# Patient Record
Sex: Female | Born: 1935 | Race: White | Hispanic: No | Smoking: Former smoker
Health system: Southern US, Community
[De-identification: ages and names within clinical notes are randomized; demographics above are authoritative.]

## PROBLEM LIST (undated history)

## (undated) DIAGNOSIS — Z860100 Personal history of colon polyps, unspecified: Secondary | ICD-10-CM

## (undated) DIAGNOSIS — I4891 Unspecified atrial fibrillation: Secondary | ICD-10-CM

## (undated) DIAGNOSIS — I2510 Atherosclerotic heart disease of native coronary artery without angina pectoris: Secondary | ICD-10-CM

## (undated) DIAGNOSIS — I10 Essential (primary) hypertension: Secondary | ICD-10-CM

## (undated) DIAGNOSIS — E785 Hyperlipidemia, unspecified: Secondary | ICD-10-CM

## (undated) DIAGNOSIS — A0472 Enterocolitis due to Clostridium difficile, not specified as recurrent: Secondary | ICD-10-CM

## (undated) HISTORY — DX: Atherosclerotic heart disease of native coronary artery without angina pectoris: I25.10

## (undated) HISTORY — DX: Personal history of colonic polyps: Z86.010

## (undated) HISTORY — DX: Enterocolitis due to Clostridium difficile, not specified as recurrent: A04.72

## (undated) HISTORY — PX: TONSILLECTOMY AND ADENOIDECTOMY: SHX28

## (undated) HISTORY — DX: Hyperlipidemia, unspecified: E78.5

## (undated) HISTORY — DX: Personal history of colon polyps, unspecified: Z86.0100

## (undated) HISTORY — DX: Essential (primary) hypertension: I10

## (undated) HISTORY — DX: Unspecified atrial fibrillation: I48.91

---

## 2007-11-22 ENCOUNTER — Encounter: Payer: Self-pay | Admitting: Internal Medicine

## 2008-08-16 DIAGNOSIS — I4891 Unspecified atrial fibrillation: Secondary | ICD-10-CM

## 2008-08-16 DIAGNOSIS — I2510 Atherosclerotic heart disease of native coronary artery without angina pectoris: Secondary | ICD-10-CM

## 2008-08-16 DIAGNOSIS — I25119 Atherosclerotic heart disease of native coronary artery with unspecified angina pectoris: Secondary | ICD-10-CM

## 2008-08-16 HISTORY — DX: Atherosclerotic heart disease of native coronary artery without angina pectoris: I25.10

## 2008-08-16 HISTORY — DX: Unspecified atrial fibrillation: I48.91

## 2008-11-06 ENCOUNTER — Encounter: Payer: Self-pay | Admitting: Internal Medicine

## 2009-02-13 ENCOUNTER — Encounter: Payer: Self-pay | Admitting: Internal Medicine

## 2009-09-16 ENCOUNTER — Ambulatory Visit: Payer: Self-pay | Admitting: Internal Medicine

## 2009-09-16 DIAGNOSIS — K5730 Diverticulosis of large intestine without perforation or abscess without bleeding: Secondary | ICD-10-CM | POA: Insufficient documentation

## 2009-09-16 DIAGNOSIS — I10 Essential (primary) hypertension: Secondary | ICD-10-CM

## 2009-09-16 DIAGNOSIS — E785 Hyperlipidemia, unspecified: Secondary | ICD-10-CM | POA: Insufficient documentation

## 2009-09-16 DIAGNOSIS — I480 Paroxysmal atrial fibrillation: Secondary | ICD-10-CM | POA: Insufficient documentation

## 2009-09-16 DIAGNOSIS — I4891 Unspecified atrial fibrillation: Secondary | ICD-10-CM

## 2009-09-18 LAB — CONVERTED CEMR LAB
ALT: 23 units/L (ref 0–35)
AST: 23 units/L (ref 0–37)
BUN: 17 mg/dL (ref 6–23)
CO2: 31 meq/L (ref 19–32)
Calcium: 9.6 mg/dL (ref 8.4–10.5)
Chloride: 106 meq/L (ref 96–112)
Creatinine, Ser: 1 mg/dL (ref 0.4–1.2)
Eosinophils Relative: 0.9 % (ref 0.0–5.0)
GFR calc non Af Amer: 56.72 mL/min (ref >60)
HCT: 40.5 % (ref 36.0–46.0)
Hemoglobin: 13.6 g/dL (ref 12.0–15.0)
Lymphs Abs: 1.9 10*3/uL (ref 0.7–4.0)
MCV: 101.9 fL — ABNORMAL HIGH (ref 78.0–100.0)
Monocytes Absolute: 0.2 10*3/uL (ref 0.1–1.0)
Monocytes Relative: 4.1 % (ref 3.0–12.0)
Neutro Abs: 3.8 10*3/uL (ref 1.4–7.7)
Platelets: 214 10*3/uL (ref 150.0–400.0)
TSH: 1.71 microintl units/mL (ref 0.35–5.50)
Total Bilirubin: 1.7 mg/dL — ABNORMAL HIGH (ref 0.3–1.2)
Total Protein: 7.1 g/dL (ref 6.0–8.3)
WBC: 6 10*3/uL (ref 4.5–10.5)

## 2010-02-05 ENCOUNTER — Telehealth: Payer: Self-pay | Admitting: Internal Medicine

## 2010-03-26 ENCOUNTER — Ambulatory Visit: Payer: Self-pay | Admitting: Internal Medicine

## 2010-03-26 DIAGNOSIS — J069 Acute upper respiratory infection, unspecified: Secondary | ICD-10-CM

## 2010-03-30 LAB — CONVERTED CEMR LAB
Cholesterol: 226 mg/dL — ABNORMAL HIGH (ref 0–200)
Triglycerides: 124 mg/dL (ref 0.0–149.0)

## 2010-09-15 NOTE — Progress Notes (Signed)
Summary: Diarrhea  Phone Note Call from Patient Call back at Home Phone [phone]   Caller: Creola Corn Call For: Cindee Salt MD Summary of Call: Patient's husband has been having diarrhea x 2 weeks with Imodium and Lomotil helping temporarily.  Ms. Pfund has had the problem for about 1 week.  No fever, no pain, no vomiting, no loss of appetite.  Please advise.  [facility] [address] Initial call taken by: Delilah Shan CMA Duncan Dull),  February 05, 2010 9:23 AM  Follow-up for Phone Call        please stop the immdium and lomotil If they have an intestinal infection, that will often prolong it  Okay to try some kaopectacte as needed but best to let this run through and just keep up with fluids  see if fever or sig pain Follow-up by: Cindee Salt MD,  February 05, 2010 10:05 AM  Additional Follow-up for Phone Call Additional follow up Details #1::        Spoke with patient's husband and advised results.  Additional Follow-up by: Mervin Hack CMA Duncan Dull),  February 05, 2010 4:54 PM

## 2010-09-15 NOTE — Assessment & Plan Note (Signed)
Summary: NEW PT TO [facility]   Vital Signs:  Patient profile:   77 year old female Height:      67 inches Weight:      160 pounds BMI:     25.15 Temp:     98.2 degrees F oral Pulse rate:   76 / minute Pulse rhythm:   regular BP sitting:   150 / 80  (left arm) Cuff size:   large  Vitals Entered By: Mervin Hack CMA Duncan Dull) (September 16, 2009 11:52 AM) CC: new patient to establish care   History of Present Illness: Establishing here Moved here from [place] in August At [facility] Satisfied with this  HTN goes back for some time did have atrial fibrillation briefly---was caused by severe C. dif infection in 3/10  Hypercholesterolemia with Rx for about 5 years No problems with meds but wants to get off Feels some increased leg cramps are due to the crestor No history of CAD Cath done 3/10 didn't show blockages  Preventive Screening-Counseling & Management  Alcohol-Tobacco     Smoking Status: quit  Allergies (verified): 1)  ! Penicillin V Potassium (Penicillin V Potassium) 2)  ! Codeine Sulfate (Codeine Sulfate)  Past History:  Past Medical History: Atrial fibrillation Diverticulosis, colon Hyperlipidemia Hypertension  Past Surgical History: 1937  Tonsils/adenoids 3/10  Severe C. dif ---had brief atrial fib then. Cath benign  Family History: Mom died of ovarian cancer. Had COPD. 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 Enjoys white wine  Has living will. Husband then son [name] (MD) to make health care decisions. Has DNR order in past and requests againSmoking Status:  quit  Review of Systems General:  Denies sleep disorder; swims nearly daily weight up 10# since the move wears seat  belt. Eyes:  Denies double vision and vision loss-1 eye. ENT:  Complains of decreased hearing; uses hearing aides concerned about cerumen Own teeth--no problems. CV:  Denies chest pain or discomfort, difficulty breathing at night, difficulty breathing while lying down, fainting, lightheadness, palpitations, and shortness of breath with exertion. Resp:  Complains of cough; denies shortness of breath; occ coughs with chlorine exposure. GI:  Denies bloody stools, change in bowel habits, dark tarry stools, and indigestion. GU:  Denies dysuria and incontinence. MS:  Complains of joint pain; denies joint swelling; mild knee pain--occ tylenol. Derm:  Denies lesion(s) and rash. Neuro:  Denies headaches, numbness, tingling, and weakness. Psych:  Denies anxiety and depression. Allergy:  Denies seasonal allergies and sneezing.  Physical Exam  General:  alert and normal appearance.   Ears:  mild cerumenosis bilat Mouth:  no erythema and no exudates.   Neck:  supple, no masses, no thyromegaly, no carotid bruits, and no cervical lymphadenopathy.   Lungs:  normal respiratory effort and normal breath sounds.   Heart:  normal rate, regular rhythm, no murmur, and no gallop.   Abdomen:  soft, non-tender, and no masses.   Msk:  no joint tenderness and no joint swelling.   Pulses:  1+ in feet Extremities:  no edema Neurologic:  alert & oriented X3, strength normal in all extremities, and gait normal.   Skin:  no rashes and no suspicious lesions.   Axillary Nodes:  No palpable lymphadenopathy Psych:  normally interactive, good eye contact, not anxious appearing, and not depressed appearing.     Impression & Recommendations:  Problem # 1:  HYPERTENSION (ICD-401.9) Assessment Unchanged good control will check labs  Her updated medication list for this problem includes:    Metoprolol Succinate 50 Mg Xr24h-tab (Metoprolol succinate) .Marland Kitchen... Take 2 by mouth two times a day    Ramipril 10 Mg Caps  (Ramipril) .Marland Kitchen... Take 1 by mouth once daily  BP today: 150/80  Problem # 2:  ATRIAL FIBRILLATION (ICD-427.31) Assessment: Comment Only  only once during acute illness on ASA and beta blocker regular now P: no changes  Her updated medication list for this problem includes:    Aspirin 81 Mg Tabs (Aspirin) .Marland Kitchen... Take 1 by mouth once daily    Metoprolol Succinate 50 Mg Xr24h-tab (Metoprolol succinate) .Marland Kitchen... Take 2 by mouth two times a day  Orders: EKG w/ Interpretation (93000) TLB-Renal Function Panel (80069-RENAL) TLB-CBC Platelet - w/Differential (85025-CBCD) TLB-Hepatic/Liver Function Pnl (80076-HEPATIC) TLB-TSH (Thyroid Stimulating Hormone) (84443-TSH) Venipuncture ([address])  Problem # 3:  HYPERLIPIDEMIA (ICD-272.4) Assessment: Comment Only she has cramps with this med Discussed prmary prevention, esp at her age, in view of recent benign cath will stop the med  Complete Medication List: 1)  Aspirin 81 Mg Tabs (Aspirin) .... Take 1 by mouth once daily 2)  Metoprolol Succinate 50 Mg Xr24h-tab (Metoprolol succinate) .... Take 2 by mouth two times a day 3)  Ramipril 10 Mg Caps (Ramipril) .... Take 1 by mouth once daily  Patient Instructions: 1)  Please schedule a follow-up appointment in 6 months .   Current Allergies (reviewed today): ! PENICILLIN V POTASSIUM (PENICILLIN V POTASSIUM) ! CODEINE SULFATE (CODEINE SULFATE)   Immunization History:  Tetanus/Td Immunization History:    Tetanus/Td:  Historical (08/16/2004)  Pneumovax Immunization History:    Pneumovax:  Historical (08/17/1999)  Zostavax History:    Zostavax # 1:  Zostavax (08/16/2006)    EKG  Procedure date:  09/16/2009  Findings:      sinus @ 75 Normal   Appended Document: NEW PT TO [facility]    Clinical Lists Changes  Problems: Added new problem of CORONARY ARTERY DISEASE (ICD-414.00) Assessed HYPERLIPIDEMIA as comment only -  in view of review of records that shows she does  have CAD, will continue crestor but at lower dose  Her updated medication list for this problem includes:    Crestor 10 Mg Tabs (Rosuvastatin calcium) .Marland Kitchen... 1 daily for high cholesterol  Medications: Added new medication of CRESTOR 10 MG TABS (ROSUVASTATIN CALCIUM) 1 daily for high cholesterol - Signed Rx of CRESTOR 10 MG TABS (ROSUVASTATIN CALCIUM) 1 daily for high cholesterol;  #30 x 12;  Signed;  Entered by: Cindee Salt MD;  Authorized by: Cindee Salt MD;  Method used: Electronically to [facility] [address] [place], [place], [place]  [address], Ph: [phone], Fax: [phone] Observations: Added new observation of PAST SURG HX: 1937  Tonsils/adenoids 3/10  Severe C. dif ---had brief atrial fib then. Cath shows some blockages but no intervention (09/16/2009 14:14) Added new observation of PAST MED HX: Atrial fibrillation--- brief spell. On multaq briefly Diverticulosis, colon Hyperlipidemia Hypertension Coronary artery disease  (09/16/2009 14:14) Added new observation of HX  CAD: yes (09/16/2009 14:14)    Prescriptions: CRESTOR 10 MG TABS (ROSUVASTATIN CALCIUM) 1 daily for high cholesterol  #30 x 12   Entered and Authorized by:  Cindee Salt MD   Signed by:   Cindee Salt MD on 09/16/2009   Method used:   Electronically to        [facility] (retail)       [address]       [place], [place]  [address]       Ph: [phone]       Fax: [phone]   RxID:   [id number]     Past History:  Past Medical History: Atrial fibrillation--- brief spell. On multaq briefly Diverticulosis, colon Hyperlipidemia Hypertension Coronary artery disease  Past Surgical History: 1937  Tonsils/adenoids 3/10  Severe C. dif ---had brief atrial fib then. Cath shows some blockages but no intervention   Impression & Recommendations:  Problem # 1:  HYPERLIPIDEMIA  (ICD-272.4) Assessment Comment Only  in view of review of records that shows she does have CAD, will continue crestor but at lower dose  Her updated medication list for this problem includes:    Crestor 10 Mg Tabs (Rosuvastatin calcium) .Marland Kitchen... 1 daily for high cholesterol  Complete Medication List: 1)  Aspirin 81 Mg Tabs (Aspirin) .... Take 1 by mouth once daily 2)  Metoprolol Succinate 50 Mg Xr24h-tab (Metoprolol succinate) .... Take 2 by mouth two times a day 3)  Ramipril 10 Mg Caps (Ramipril) .... Take 1 by mouth once daily 4)  Crestor 10 Mg Tabs (Rosuvastatin calcium) .Marland Kitchen.. 1 daily for high cholesterol

## 2010-09-15 NOTE — Letter (Signed)
Summary: Dr.Mahmoud Atieh,[facility],Records  Dr.Mahmoud Atieh,[facility],Records   Imported By: Beau Fanny 09/16/2009 16:35:41  _____________________________________________________________________  External Attachment:    Type:   Image     Comment:   External Document

## 2010-09-15 NOTE — Miscellaneous (Signed)
Summary: Do Not Resuscitate Order  Do Not Resuscitate Order   Imported By: Beau Fanny 09/16/2009 14:45:44  _____________________________________________________________________  External Attachment:    Type:   Image     Comment:   External Document

## 2010-09-15 NOTE — Letter (Signed)
Summary: Dr.William [facility] Records  [facility] Records   Imported By: Beau Fanny 09/16/2009 15:33:01  _____________________________________________________________________  External Attachment:    Type:   Image     Comment:   External Document

## 2010-09-15 NOTE — Assessment & Plan Note (Signed)
Summary: 6 MONTH FOLLOW UP/[facility]   Vital Signs:  Patient profile:   77 year old female Weight:      163 pounds Temp:     98.5 degrees F oral Pulse rate:   72 / minute Pulse rhythm:   regular BP sitting:   158 / 80  (left arm) Cuff size:   large  Vitals Entered By: Mervin Hack CMA Duncan Dull) (March 26, 2010 3:52 PM) CC: 6 month follow-up   History of Present Illness: doing well except for apparent sinus infection Has bed maxillary pain and PND No sig cough No fever Has runny nose No ear pain or sore throat Started a couple of days ago  No heart problems No chest pain No SOB Swims daily --up to 50 laps No edema  No myalgias on statin  Allergies: 1)  ! Penicillin V Potassium (Penicillin V Potassium) 2)  ! Codeine Sulfate (Codeine Sulfate)  Past History:  Past medical, surgical, family and social histories (including risk factors) reviewed for relevance to current acute and chronic problems.  Past Medical History: Reviewed history from 09/16/2009 and no changes required. Atrial fibrillation--- brief spell. On multaq briefly Diverticulosis, colon Hyperlipidemia Hypertension Coronary artery disease  Past Surgical History: Reviewed history from 09/16/2009 and no changes required. 1937  Tonsils/adenoids 3/10  Severe C. dif ---had brief atrial fib then. Cath shows some blockages but no intervention  Family History: Reviewed history from 09/16/2009 and no changes required. Mom died of ovarian cancer. Had COPD. Died in 82's Dad died in 67's of breathing problems 1 sister died of bile duct cancer 1 sister died (she isn't sure of what) 1 sister, 1 brother still living No CAD, DM No breast or colon cancer  Social History: Reviewed history from 09/16/2009 and no changes required. 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 Enjoys white wine  Has living will. Husband then son [name] (MD) to make health care decisions.  Has DNR order in past and requests again  Review of Systems       Appetite is fine weight up slightly  sleeps well--uses OTC sleep aide  Physical Exam  General:  alert and normal appearance.   Head:  No maxillary or frontal tenderness Ears:  R ear normal and L ear normal.   Nose:  mild inflammation and moderate congestion Mouth:  no erythema and no exudates.   Neck:  supple, no masses, and no cervical lymphadenopathy.   Lungs:  normal respiratory effort, no intercostal retractions, no accessory muscle use, and normal breath sounds.   Heart:  normal rate, regular rhythm, no murmur, and no gallop.   Extremities:  no edema Psych:  normally interactive, good eye contact, not anxious appearing, and not depressed appearing.     Impression & Recommendations:  Problem # 1:  URI (ICD-465.9) Assessment New seems to be viral discussed symptomatic Rx if worsens, will use z-pak  Problem # 2:  CORONARY ARTERY DISEASE (ICD-414.00) Assessment: Unchanged quiet quite fit no changes needed  Her updated medication list for this problem includes:    Metoprolol Succinate 50 Mg Xr24h-tab (Metoprolol succinate) .Marland Kitchen... Take 2 by mouth two times a day    Ramipril 10 Mg Caps (Ramipril) .Marland Kitchen... Take 1 by mouth once daily    Aspirin 81 Mg Tabs (Aspirin) .Marland Kitchen... Take 1 by mouth once daily  Problem # 3:  HYPERLIPIDEMIA (ICD-272.4) Assessment: Unchanged  due for labs  Her updated medication list for this problem includes:  Crestor 10 Mg Tabs (Rosuvastatin calcium) .Marland Kitchen... 1 daily for high cholesterol  Labs Reviewed: SGOT: 23 (09/16/2009)   SGPT: 23 (09/16/2009)  Orders: TLB-Lipid Panel (80061-LIPID) Venipuncture ([address])  Problem # 4:  HYPERTENSION (ICD-401.9) Assessment: Unchanged reasonable control no changes for now  Her updated medication list for this problem includes:    Metoprolol Succinate 50 Mg Xr24h-tab (Metoprolol succinate) .Marland Kitchen... Take 2 by mouth two times a day    Ramipril 10 Mg  Caps (Ramipril) .Marland Kitchen... Take 1 by mouth once daily  BP today: 158/80 Prior BP: 150/80 (09/16/2009)  Labs Reviewed: K+: 4.2 (09/16/2009) Creat: : 1.0 (09/16/2009)     Complete Medication List: 1)  Metoprolol Succinate 50 Mg Xr24h-tab (Metoprolol succinate) .... Take 2 by mouth two times a day 2)  Ramipril 10 Mg Caps (Ramipril) .... Take 1 by mouth once daily 3)  Crestor 10 Mg Tabs (Rosuvastatin calcium) .Marland Kitchen.. 1 daily for high cholesterol 4)  Aspirin 81 Mg Tabs (Aspirin) .... Take 1 by mouth once daily 5)  Azithromycin 250 Mg Tabs (Azithromycin) .... 2 tabs on first day and 1 tab each of the next 4 days for sinus infection  Patient Instructions: 1)  Please schedule a follow-up appointment in 6 months .  2)  Please take the antibioitic if you worsen Prescriptions: AZITHROMYCIN 250 MG TABS (AZITHROMYCIN) 2 tabs on first day and 1 tab each of the next 4 days for sinus infection  #6 x 0   Entered and Authorized by:   Cindee Salt MD   Signed by:   Cindee Salt MD on 03/26/2010   Method used:   Print then Give to Patient   RxID:   [id number]   Current Allergies (reviewed today): ! PENICILLIN V POTASSIUM (PENICILLIN V POTASSIUM) ! CODEINE SULFATE (CODEINE SULFATE)

## 2010-09-15 NOTE — Miscellaneous (Signed)
Summary: Health Care Power of [facility] Power of Attorney   Imported By: Beau Fanny 09/16/2009 15:35:35  _____________________________________________________________________  External Attachment:    Type:   Image     Comment:   External Document

## 2010-09-25 ENCOUNTER — Ambulatory Visit: Payer: Self-pay | Admitting: Internal Medicine

## 2010-10-01 ENCOUNTER — Encounter: Payer: Self-pay | Admitting: Internal Medicine

## 2010-10-01 ENCOUNTER — Ambulatory Visit (INDEPENDENT_AMBULATORY_CARE_PROVIDER_SITE_OTHER): Payer: Medicare Other | Admitting: Internal Medicine

## 2010-10-01 DIAGNOSIS — E785 Hyperlipidemia, unspecified: Secondary | ICD-10-CM

## 2010-10-01 DIAGNOSIS — I10 Essential (primary) hypertension: Secondary | ICD-10-CM

## 2010-10-01 DIAGNOSIS — I2510 Atherosclerotic heart disease of native coronary artery without angina pectoris: Secondary | ICD-10-CM

## 2010-10-02 ENCOUNTER — Encounter: Payer: Self-pay | Admitting: Internal Medicine

## 2010-10-07 NOTE — Assessment & Plan Note (Signed)
Summary: 6 MTH F/U/CLE   R/S FROM 09/24/10   Vital Signs:  Patient profile:   77 year old female Weight:      165 pounds BMI:     25.94 Temp:     98.7 degrees F oral Pulse rate:   70 / minute Pulse rhythm:   regular BP sitting:   183 / 84  (left arm) Cuff size:   large  Vitals Entered By: Mervin Hack CMA Duncan Dull) (October 01, 2010 4:32 PM)  Serial Vital Signs/Assessments:  Time      Position  BP       Pulse  Resp  Temp     By           R Arm     168/84                         Cindee Salt MD  CC: follow-up   History of Present Illness: feels well Having stress due to son being operated on for brain cancer BP has been elevated of late---she can tell though she doesn't check No headache  No chest pain No SOB Still swims 50 laps every other day Good exercise tolerance  Occ gets some cramps--uses OTC potassium pills No clear cut myalgia though  No palpitations  Stress but emotionally okay with son's condition  Allergies: 1)  ! Penicillin V Potassium (Penicillin V Potassium) 2)  ! Codeine Sulfate (Codeine Sulfate)  Past History:  Past medical, surgical, family and social histories (including risk factors) reviewed for relevance to current acute and chronic problems.  Past Medical History: Reviewed history from 09/16/2009 and no changes required. Atrial fibrillation--- brief spell. On multaq briefly Diverticulosis, colon Hyperlipidemia Hypertension Coronary artery disease  Past Surgical History: Reviewed history from 09/16/2009 and no changes required. 1937  Tonsils/adenoids 3/10  Severe C. dif ---had brief atrial fib then. Cath shows some blockages but no intervention  Family History: Mom died of ovarian cancer. Had COPD. Died in 36's Dad died in 46's of breathing problems 1 sister died of bile duct cancer 1 sister died (she isn't sure of what) 1 sister, 1 brother still living Son with brain cancer No CAD, DM No breast or colon  cancer  Social History: Reviewed history from 09/16/2009 and no changes required. 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 Enjoys white wine  Has living will. Husband then son [name] (MD) to make health care decisions. Has DNR order in past and requests again  Review of Systems       appetite is okay weight is stable sleeps okay with OTC sleep aide occ. No AM grogginess Advised that melatonin is safer  Physical Exam  General:  alert and normal appearance.   Neck:  supple, no masses, no thyromegaly, and no cervical lymphadenopathy.   Lungs:  normal respiratory effort, no intercostal retractions, no accessory muscle use, and normal breath sounds.   Heart:  normal rate, regular rhythm, no murmur, and no gallop.   Extremities:  no edema Psych:  normally interactive, good eye contact, not depressed appearing, and slightly anxious.     Impression & Recommendations:  Problem # 1:  HYPERTENSION (ICD-401.9) Assessment Deteriorated recheck by me closer to last time Son having brain cancer surgery in [place] today will hold off on adding meds (would have done HCTZ) check at [facility]  Her updated medication list for this problem includes:    Metoprolol  Succinate 50 Mg Xr24h-tab (Metoprolol succinate) .Marland Kitchen... Take 2 by mouth two times a day    Ramipril 10 Mg Caps (Ramipril) .Marland Kitchen... Take 1 by mouth once daily  BP today: 183/84 Prior BP: 158/80 (03/26/2010)  Labs Reviewed: K+: 4.2 (09/16/2009) Creat: : 1.0 (09/16/2009)   Chol: 226 (03/26/2010)   HDL: 72.10 (03/26/2010)   TG: 124.0 (03/26/2010)  Problem # 2:  CORONARY ARTERY DISEASE (ICD-414.00) Assessment: Unchanged quiet excellant physical fitness  Her updated medication list for this problem includes:    Metoprolol Succinate 50 Mg Xr24h-tab (Metoprolol succinate) .Marland Kitchen... Take 2 by mouth two times a day    Ramipril 10 Mg Caps (Ramipril) .Marland Kitchen... Take 1 by mouth once daily    Aspirin  81 Mg Tabs (Aspirin) .Marland Kitchen... Take 1 by mouth once daily  Problem # 3:  HYPERLIPIDEMIA (ICD-272.4) Assessment: Unchanged no apparent side effects  Her updated medication list for this problem includes:    Crestor 10 Mg Tabs (Rosuvastatin calcium) .Marland Kitchen... 1 daily for high cholesterol  Labs Reviewed: SGOT: 23 (09/16/2009)   SGPT: 23 (09/16/2009)   HDL:72.10 (03/26/2010)  Chol:226 (03/26/2010)  Trig:124.0 (03/26/2010)  Complete Medication List: 1)  Metoprolol Succinate 50 Mg Xr24h-tab (Metoprolol succinate) .... Take 2 by mouth two times a day 2)  Ramipril 10 Mg Caps (Ramipril) .... Take 1 by mouth once daily 3)  Crestor 10 Mg Tabs (Rosuvastatin calcium) .Marland Kitchen.. 1 daily for high cholesterol 4)  Aspirin 81 Mg Tabs (Aspirin) .... Take 1 by mouth once daily  Patient Instructions: 1)  Please have Mandy at [facility] check your blood pressure 2 or 3 times in the next month 2)  Please schedule a follow-up appointment in 3 month.    Orders Added: 1)  Est. Patient Level IV [[address]]    Current Allergies (reviewed today): ! PENICILLIN V POTASSIUM (PENICILLIN V POTASSIUM) ! CODEINE SULFATE (CODEINE SULFATE)

## 2010-10-16 ENCOUNTER — Encounter: Payer: Self-pay | Admitting: Family Medicine

## 2010-10-19 ENCOUNTER — Encounter: Payer: Self-pay | Admitting: Family Medicine

## 2010-10-20 ENCOUNTER — Telehealth: Payer: Self-pay | Admitting: Family Medicine

## 2010-10-22 ENCOUNTER — Encounter: Payer: Self-pay | Admitting: Family Medicine

## 2010-10-22 ENCOUNTER — Ambulatory Visit (INDEPENDENT_AMBULATORY_CARE_PROVIDER_SITE_OTHER): Payer: Medicare Other | Admitting: Family Medicine

## 2010-10-22 DIAGNOSIS — I10 Essential (primary) hypertension: Secondary | ICD-10-CM

## 2010-10-22 NOTE — Letter (Signed)
Summary: medical clearance   medical clearance   Imported By: Kassie Mends 10/16/2010 09:49:43  _____________________________________________________________________  External Attachment:    Type:   Image     Comment:   External Document

## 2010-10-27 NOTE — Letter (Signed)
Summary: [facility]-pt's blood pressure readings  [facility]'s blood pressure readings   Imported By: Beau Fanny 10/21/2010 10:30:21  _____________________________________________________________________  External Attachment:    Type:   Image     Comment:   External Document

## 2010-10-27 NOTE — Assessment & Plan Note (Signed)
Summary: elevated blood pressure/alc   Vital Signs:  Patient profile:   77 year old female Height:      67 inches Weight:      164.50 pounds BMI:     25.86 Temp:     98.3 degrees F oral Pulse rate:   74 / minute Pulse rhythm:   regular BP sitting:   160 / 80  (left arm) Cuff size:   regular  Vitals Entered By: Linde Gillis CMA Duncan Dull) (October 22, 2010 9:03 AM) CC: elevated blood pressure   History of Present Illness: 77 yo here for elevated blood pressure.  Mandy, RN, sent over readings from [facility], ranging from 140/78 to 188/84. Pt is asymptomatic, no blurred vision, CP, palpations. No headaches.  Under great deal of stress- son has terminal brain cancer and she cannot keep herself from worrying about him.  Husband also has dementia.    She prays a lot and feels that is all she needs for her anxiety.    Sleeping ok, appetite is good. No SI.  Current Medications (verified): 1)  Metoprolol Succinate 50 Mg Xr24h-Tab (Metoprolol Succinate) .... Take 2 By Mouth Two Times A Day 2)  Ramipril 10 Mg Caps (Ramipril) .... Take 1 By Mouth Once Daily 3)  Crestor 10 Mg Tabs (Rosuvastatin Calcium) .Marland Kitchen.. 1 Daily For High Cholesterol 4)  Aspirin 81 Mg Tabs (Aspirin) .... Take 1 By Mouth Once Daily  Allergies: 1)  ! Penicillin V Potassium (Penicillin V Potassium) 2)  ! Codeine Sulfate (Codeine Sulfate)  Past History:  Past Medical History: Last updated: 09/16/2009 Atrial fibrillation--- brief spell. On multaq briefly Diverticulosis, colon Hyperlipidemia Hypertension Coronary artery disease  Past Surgical History: Last updated: 09/16/2009 1937  Tonsils/adenoids 3/10  Severe C. dif ---had brief atrial fib then. Cath shows some blockages but no intervention  Family History: Last updated: October 09, 2010 Mom died of ovarian cancer. Had COPD. 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 Enjoys white wine  Has living will. Husband then son [name] (MD) to make health care decisions. Has DNR order in past and requests again  Risk Factors: Smoking Status: quit (09/16/2009)  Review of Systems      See HPI Eyes:  Denies blurring. CV:  Denies chest pain or discomfort and shortness of breath with exertion. Resp:  Denies shortness of breath.  Physical Exam  General:  alert and normal appearance.   BP 168/80 Mouth:  no erythema and no exudates.   Lungs:  normal respiratory effort, no intercostal retractions, no accessory muscle use, and normal breath sounds.   Heart:  normal rate, regular rhythm, no murmur, and no gallop.   Extremities:  no edema Psych:  normally interactive, good eye contact, not depressed appearing, and slightly anxious.     Impression & Recommendations:  Problem # 1:  HYPERTENSION (ICD-401.9) Assessment Unchanged BP today close to her range in EMR. Discussed with pt today, we can add HCTZ to her Metoprolol and Ramipril but since she is close to baseline at this point, I would concerned about hypotension.  She has appt scheduled with Dr. Alphonsus Sias in a couple of weeks.  I will also have her check her  home BP cuff with Mandy's cuff for comparison.  I can see her at [facility] next week. Her updated medication list for this problem includes:    Metoprolol Succinate 50 Mg Xr24h-tab (Metoprolol succinate) .Marland Kitchen... Take 2 by mouth two times a day    Ramipril 10 Mg Caps (Ramipril) .Marland Kitchen... Take 1 by mouth once daily  Complete Medication List: 1)  Metoprolol Succinate 50 Mg Xr24h-tab (Metoprolol succinate) .... Take 2 by mouth two times a day 2)  Ramipril 10 Mg Caps (Ramipril) .... Take 1 by mouth once daily 3)  Crestor 10 Mg Tabs (Rosuvastatin calcium)  .Marland Kitchen.. 1 daily for high cholesterol 4)  Aspirin 81 Mg Tabs (Aspirin) .... Take 1 by mouth once daily   Orders Added: 1)  Est. Patient Level III [16109]    Current Allergies (reviewed today): ! PENICILLIN V POTASSIUM (PENICILLIN V POTASSIUM) ! CODEINE SULFATE (CODEINE SULFATE)

## 2010-10-27 NOTE — Miscellaneous (Signed)
Summary: BP Checks/[facility]  BP Checks/[facility]   Imported By: Maryln Gottron 10/21/2010 15:26:44  _____________________________________________________________________  External Attachment:    Type:   Image     Comment:   External Document

## 2010-10-27 NOTE — Progress Notes (Signed)
Summary: BP is elevated  Phone Note From Other Clinic Call back at Home Phone [phone]   Caller: Angelica Chessman at [facility]   Summary of Call: Angelica Chessman called to report that pt's BP is 188/84 today, feels fine but under a lot of stress.  They have faxed over a list of BP readings from over the past few days, list is on your desk. Initial call taken by: Lowella Petties CMA, AAMA,  October 20, 2010 5:09 PM  Follow-up for Phone Call        If she is having CP or getting short of breath, then she needs eval at ER.  If not having symptoms, then okay to check BP over the next few days and then notify the clinic.  According to the records presented, her BP had been lower recently and I don't want to change her meds and induce hypotension.  thanks.  Crawford Givens MD  October 20, 2010 5:32 PM   Additional Follow-up for Phone Call Additional follow up Details #1::        Patient Advised and Angelica Chessman at [facility] advised.  Angelica Chessman says she will contact Dr. Dayton Martes tomorrow to see if she wants an appt. scheduled.  Delilah Shan CMA Duncan Dull)  October 20, 2010 5:43 PM     Additional Follow-up for Phone Call Additional follow up Details #2::    Please call Angelica Chessman to let her know that I can work her in the clinic here tomorrow or someone else can probably see her here today.  I will not be having a [facility] today because Alphonsus Sias is out of town and we have a lot of acute issues at [employer]. Thanks, Ruthe Mannan MD  October 21, 2010 7:13 AM  Message left on [facility] voicemail advising as instructed.  Follow-up by: Linde Gillis CMA Duncan Dull),  October 21, 2010 7:50 AM

## 2011-01-01 ENCOUNTER — Ambulatory Visit: Payer: Medicare Other | Admitting: Internal Medicine

## 2011-01-12 ENCOUNTER — Encounter: Payer: Self-pay | Admitting: Internal Medicine

## 2011-01-13 ENCOUNTER — Telehealth: Payer: Self-pay | Admitting: *Deleted

## 2011-01-13 ENCOUNTER — Ambulatory Visit (INDEPENDENT_AMBULATORY_CARE_PROVIDER_SITE_OTHER): Payer: Medicare Other | Admitting: Internal Medicine

## 2011-01-13 ENCOUNTER — Encounter: Payer: Self-pay | Admitting: Internal Medicine

## 2011-01-13 VITALS — BP 122/70 | HR 74 | Temp 98.4°F | Ht 67.0 in | Wt 163.0 lb

## 2011-01-13 DIAGNOSIS — I2510 Atherosclerotic heart disease of native coronary artery without angina pectoris: Secondary | ICD-10-CM

## 2011-01-13 DIAGNOSIS — I4891 Unspecified atrial fibrillation: Secondary | ICD-10-CM

## 2011-01-13 DIAGNOSIS — I10 Essential (primary) hypertension: Secondary | ICD-10-CM

## 2011-01-13 DIAGNOSIS — E785 Hyperlipidemia, unspecified: Secondary | ICD-10-CM

## 2011-01-13 LAB — BASIC METABOLIC PANEL
Calcium: 9.7 mg/dL (ref 8.4–10.5)
GFR: 53.44 mL/min — ABNORMAL LOW (ref >60.00)
Glucose, Bld: 100 mg/dL — ABNORMAL HIGH (ref 70–99)
Potassium: 6.3 mEq/L (ref 3.5–5.1)
Sodium: 137 mEq/L (ref 135–145)

## 2011-01-13 LAB — HEPATIC FUNCTION PANEL
Alkaline Phosphatase: 70 U/L (ref 39–117)
Bilirubin, Direct: 0.2 mg/dL (ref 0.0–0.3)
Total Bilirubin: 1.3 mg/dL — ABNORMAL HIGH (ref 0.3–1.2)
Total Protein: 7 g/dL (ref 6.0–8.3)

## 2011-01-13 LAB — CBC WITH DIFFERENTIAL/PLATELET
Basophils Relative: 0.8 % (ref 0.0–3.0)
Eosinophils Absolute: 0.2 10*3/uL (ref 0.0–0.7)
Eosinophils Relative: 2.2 % (ref 0.0–5.0)
Lymphocytes Relative: 29.8 % (ref 12.0–46.0)
Neutrophils Relative %: 62.3 % (ref 43.0–77.0)
RBC: 4.16 Mil/uL (ref 3.87–5.11)
WBC: 7.5 10*3/uL (ref 4.5–10.5)

## 2011-01-13 LAB — LIPID PANEL
HDL: 70.1 mg/dL (ref >39.00)
Total CHOL/HDL Ratio: 3
Triglycerides: 212 mg/dL — ABNORMAL HIGH (ref 0.0–149.0)
VLDL: 42.4 mg/dL — ABNORMAL HIGH (ref 0.0–40.0)

## 2011-01-13 LAB — TSH: TSH: 2.25 u[IU]/mL (ref 0.35–5.50)

## 2011-01-13 NOTE — Telephone Encounter (Signed)
Thanks

## 2011-01-13 NOTE — Patient Instructions (Signed)
Please try off the crestor. If the cramps are not improved, you can restart in about a month if you would like

## 2011-01-13 NOTE — Progress Notes (Signed)
  Subjective:    Patient ID: Jamie Hodges, female    DOB: Jan 09, 1928, 77 y.o.   MRN: [id number]  HPI Son is not doing well Has had 3 surgeries--now with heavy chemo [facility]) Still with ongoing stress  BP has been better She checks at [facility], etc Usually 140 or under systolic  No palpitations No chest pain No SOB Still swims regularly--no change in stamina  Notes cramping in legs and hands--mostly at night but occ in pool  Current outpatient prescriptions:aspirin 81 MG tablet, Take 81 mg by mouth daily.  , Disp: , Rfl: ;  metoprolol (TOPROL-XL) 50 MG 24 hr tablet, Take 100 mg by mouth 2 (two) times daily.  , Disp: , Rfl: ;  ramipril (ALTACE) 10 MG capsule, Take 10 mg by mouth daily.  , Disp: , Rfl: ;  rosuvastatin (CRESTOR) 10 MG tablet, Take 10 mg by mouth daily.  , Disp: , Rfl:   Past Medical History  Diagnosis Date  . Atrial fibrillation   . Hx of colonic polyps   . Hyperlipidemia   . Hypertension   . CAD (coronary artery disease)   . C. difficile colitis     3/10  Severe C. dif ---had brief atrial fib then. Cath shows some blockages but no intervention    Past Surgical History  Procedure Date  . Tonsillectomy and adenoidectomy     Family History  Problem Relation Age of Onset  . COPD Mother   . Coronary artery disease Neg Hx   . Diabetes Neg Hx   . Cancer Neg Hx     breast or colon cancer    History   Social History  . Marital Status: Married    Spouse Name: N/A    Number of Children: 3  . Years of Education: N/A   Occupational History  . retired 1st grade teacher    Social History Main Topics  . Smoking status: Former Smoker    Quit date: 08/16/1978  . Smokeless tobacco: Not on file  . Alcohol Use: Yes     white wine  . Drug Use: Not on file  . Sexually Active: Not on file   Other Topics Concern  . Not on file   Social History Narrative   Has living will. Husband then son [name] (MD) to make health care decisions. Has DNR order in past and  requests again   Review of Systems Recent stomach bug--over this now Appetite back to normal Weight stable Sleeps okay    Objective:   Physical Exam  Constitutional: She appears well-developed and well-nourished. No distress.  Neck: Normal range of motion. Neck supple. No thyromegaly present.  Cardiovascular: Normal rate, regular rhythm, normal heart sounds and intact distal pulses.  Exam reveals no gallop.   No murmur heard. Pulmonary/Chest: Effort normal and breath sounds normal. No respiratory distress. She has no wheezes. She has no rales.  Abdominal: Soft. There is no tenderness.  Musculoskeletal: Normal range of motion. She exhibits no edema and no tenderness.  Lymphadenopathy:    She has no cervical adenopathy.  Psychiatric: She has a normal mood and affect. Her behavior is normal. Judgment and thought content normal.          Assessment & Plan:

## 2011-01-13 NOTE — Telephone Encounter (Signed)
Spoke with Dr. Alphonsus Sias and was advised to call patient and have her hold the Altace until we recheck her Potassium levels tomorrow.  I scheduled patient for labs tomorrow and I called her home number and left a message on the machine advising as instructed.  I will call her first thing tomorrow morning to make sure she received the message and can come in for repeat labs.

## 2011-01-13 NOTE — Telephone Encounter (Signed)
Received a critical lab: Potassium 6.3.  Dr. Alphonsus Sias was paged and waiting his return call.

## 2011-01-14 ENCOUNTER — Other Ambulatory Visit (INDEPENDENT_AMBULATORY_CARE_PROVIDER_SITE_OTHER): Payer: Medicare Other | Admitting: Internal Medicine

## 2011-01-14 DIAGNOSIS — E875 Hyperkalemia: Secondary | ICD-10-CM

## 2011-01-14 LAB — POTASSIUM: Potassium: 4.8 mEq/L (ref 3.5–5.1)

## 2011-01-14 NOTE — Telephone Encounter (Signed)
Spoke with patients spouse and he will give her the message to come in this morning to have repeat labs done.

## 2011-01-14 NOTE — Telephone Encounter (Signed)
Okay Probably not truly that elevated so will await repeat

## 2011-02-19 ENCOUNTER — Other Ambulatory Visit: Payer: Self-pay | Admitting: Internal Medicine

## 2011-03-15 ENCOUNTER — Other Ambulatory Visit: Payer: Self-pay | Admitting: Internal Medicine

## 2011-04-29 ENCOUNTER — Telehealth: Payer: Self-pay | Admitting: *Deleted

## 2011-04-29 NOTE — Telephone Encounter (Signed)
Patient's husband dropped off note asking for a form for patient, not sure which form he's talking about.  Marland Kitchenleft message to have patient return my call.

## 2011-04-29 NOTE — Telephone Encounter (Signed)
Patient called back and didn't know what form her husband was talking about, she will check and if something is needed she will call herself.

## 2011-06-08 ENCOUNTER — Encounter: Payer: Self-pay | Admitting: Family Medicine

## 2011-06-08 ENCOUNTER — Ambulatory Visit (INDEPENDENT_AMBULATORY_CARE_PROVIDER_SITE_OTHER): Payer: Medicare Other | Admitting: Family Medicine

## 2011-06-08 VITALS — BP 150/78 | HR 92 | Temp 98.7°F | Wt 163.0 lb

## 2011-06-08 DIAGNOSIS — R197 Diarrhea, unspecified: Secondary | ICD-10-CM

## 2011-06-08 MED ORDER — DIPHENOXYLATE-ATROPINE 2.5-0.025 MG PO TABS: 1.0000 | ORAL_TABLET | Freq: Four times a day (QID) | ORAL | Status: AC | PRN

## 2011-06-08 NOTE — Progress Notes (Signed)
Her son is very ill, dying from glioblastoma in [place].  Planning on going back soon.  Prev with C Diff a few years ago.  Sx started last night.  Diarrhea with nausea.   Since then with mult episodes of diarrhea, but better this AM.  No fevers.  No chest pain.  Dec in appetite but taking fluids well.  No presyncope.  Episodic, mild upper abd pain, irregular pattern.  No blood in stool. Taking imodium.  Prev had taking lomotil.    She an another episode similar to this in August.    Meds, vitals, and allergies reviewed.   ROS: See HPI.  Otherwise, noncontributory.  nad ncat Mmm rrr ctab abd soft, not ttp, no masses, normal BS, no rebound Ext well perfused.

## 2011-06-08 NOTE — Patient Instructions (Signed)
You can get your results through our phone system.  Follow the instructions on the blue card. Take care.  Use the imodium as needed.  Take the lomotil if that doesn't work.  Drink plenty of fluids.

## 2011-06-09 ENCOUNTER — Encounter: Payer: Self-pay | Admitting: Family Medicine

## 2011-06-09 DIAGNOSIS — K529 Noninfective gastroenteritis and colitis, unspecified: Secondary | ICD-10-CM | POA: Insufficient documentation

## 2011-06-09 NOTE — Assessment & Plan Note (Signed)
Unclear source.  Improved at time of exam.  Use lomotil prn if imodium doesn't help.  Check stool studies and f/u prn.  She agrees.

## 2011-06-12 LAB — STOOL CULTURE

## 2011-07-15 ENCOUNTER — Ambulatory Visit (INDEPENDENT_AMBULATORY_CARE_PROVIDER_SITE_OTHER): Payer: Medicare Other | Admitting: Internal Medicine

## 2011-07-15 ENCOUNTER — Encounter: Payer: Self-pay | Admitting: Internal Medicine

## 2011-07-15 VITALS — BP 156/88 | HR 76 | Ht 67.0 in | Wt 164.0 lb

## 2011-07-15 DIAGNOSIS — E785 Hyperlipidemia, unspecified: Secondary | ICD-10-CM

## 2011-07-15 DIAGNOSIS — R197 Diarrhea, unspecified: Secondary | ICD-10-CM

## 2011-07-15 DIAGNOSIS — I2510 Atherosclerotic heart disease of native coronary artery without angina pectoris: Secondary | ICD-10-CM

## 2011-07-15 DIAGNOSIS — I10 Essential (primary) hypertension: Secondary | ICD-10-CM

## 2011-07-15 NOTE — Assessment & Plan Note (Signed)
Found on cath Not critical Never had symptoms On asa, beta blocker and ACEI

## 2011-07-15 NOTE — Assessment & Plan Note (Signed)
Intermittent Uses immodium prn

## 2011-07-15 NOTE — Progress Notes (Signed)
Subjective:    Patient ID: Jamie Hodges, female    DOB: [birth year], 77 y.o.   MRN: [id number]  HPI Doing okay The diarrhea is better. Not sick. Can be controlled by immodium  Son now in hospice care Expects the call anytime about his death  Stopped the statin Cramps went away Doesn't want to try another one  No chest pain Occ SOB but still does 50 laps in pool over 30 minutes or more No edema No palpitations  Current Outpatient Prescriptions on File Prior to Visit  Medication Sig Dispense Refill  . aspirin 81 MG tablet Take 81 mg by mouth daily.        . Loperamide HCl (IMODIUM A-D PO) Take by mouth as needed.        . metoprolol (TOPROL-XL) 50 MG 24 hr tablet TAKE TWO TABLETS BY MOUTH TWICE DAILY  180 tablet  3  . Potassium (POTASSIMIN PO) Take by mouth as needed.        . ramipril (ALTACE) 10 MG capsule TAKE ONE CAPSULE BY MOUTH EVERY DAY  30 capsule  11    Allergies  Allergen Reactions  . Codeine Sulfate     REACTION: rash/ welps  . Penicillins     REACTION: rash and welps    Past Medical History  Diagnosis Date  . Atrial fibrillation   . Hx of colonic polyps   . Hyperlipidemia   . Hypertension   . CAD (coronary artery disease)   . C. difficile colitis     3/10  Severe C. dif ---had brief atrial fib then. Cath shows some blockages but no intervention    Past Surgical History  Procedure Date  . Tonsillectomy and adenoidectomy     Family History  Problem Relation Age of Onset  . COPD Mother   . Coronary artery disease Neg Hx   . Diabetes Neg Hx   . Cancer Neg Hx     breast or colon cancer    History   Social History  . Marital Status: Married    Spouse Name: N/A    Number of Children: 3  . Years of Education: N/A   Occupational History  . retired 1st grade teacher    Social History Main Topics  . Smoking status: Former Smoker    Quit date: 08/16/1978  . Smokeless tobacco: Never Used  . Alcohol Use: Yes     white wine  . Drug Use: No  .  Sexually Active: Not on file   Other Topics Concern  . Not on file   Social History Narrative   Has living will. Husband then son [name] (MD) to make health care decisions. Has DNR order in past and requests again   Review of Systems Appetite fine Weight is stable Sleeps okay with OTC sleep aide---occ AM grogginess. Melatonin was not effective     Objective:   Physical Exam  Constitutional: She appears well-developed and well-nourished. No distress.  Neck: Normal range of motion. Neck supple.  Cardiovascular: Normal rate, regular rhythm and normal heart sounds.  Exam reveals no gallop.   No murmur heard. Pulmonary/Chest: Effort normal and breath sounds normal. No respiratory distress. She has no wheezes. She has no rales.  Musculoskeletal: She exhibits no edema and no tenderness.  Lymphadenopathy:    She has no cervical adenopathy.  Psychiatric: She has a normal mood and affect. Her behavior is normal. Judgment and thought content normal.          Assessment &  Plan:

## 2011-07-15 NOTE — Assessment & Plan Note (Signed)
BP Readings from Last 3 Encounters:  07/15/11 156/88  06/08/11 150/78  01/13/11 122/70   BP up mildly If up again next time, would consider increasing toprol

## 2011-07-15 NOTE — Assessment & Plan Note (Signed)
Myalgias with crestor She is reluctant to try any others  Discussed proper eating and continued exercise

## 2011-07-22 ENCOUNTER — Ambulatory Visit (INDEPENDENT_AMBULATORY_CARE_PROVIDER_SITE_OTHER): Payer: Medicare Other

## 2011-07-22 DIAGNOSIS — Z23 Encounter for immunization: Secondary | ICD-10-CM

## 2011-09-24 ENCOUNTER — Other Ambulatory Visit: Payer: Self-pay | Admitting: Internal Medicine

## 2011-12-08 DIAGNOSIS — Z85828 Personal history of other malignant neoplasm of skin: Secondary | ICD-10-CM | POA: Diagnosis not present

## 2011-12-08 DIAGNOSIS — C44319 Basal cell carcinoma of skin of other parts of face: Secondary | ICD-10-CM | POA: Diagnosis not present

## 2011-12-08 DIAGNOSIS — L570 Actinic keratosis: Secondary | ICD-10-CM | POA: Diagnosis not present

## 2011-12-08 DIAGNOSIS — C44320 Squamous cell carcinoma of skin of unspecified parts of face: Secondary | ICD-10-CM | POA: Diagnosis not present

## 2011-12-20 DIAGNOSIS — C44320 Squamous cell carcinoma of skin of unspecified parts of face: Secondary | ICD-10-CM | POA: Diagnosis not present

## 2011-12-20 DIAGNOSIS — Z85828 Personal history of other malignant neoplasm of skin: Secondary | ICD-10-CM | POA: Diagnosis not present

## 2012-01-13 ENCOUNTER — Ambulatory Visit (INDEPENDENT_AMBULATORY_CARE_PROVIDER_SITE_OTHER): Payer: Medicare Other | Admitting: Internal Medicine

## 2012-01-13 ENCOUNTER — Encounter: Payer: Self-pay | Admitting: Internal Medicine

## 2012-01-13 VITALS — BP 130/80 | HR 86 | Temp 98.4°F | Wt 159.0 lb

## 2012-01-13 DIAGNOSIS — E785 Hyperlipidemia, unspecified: Secondary | ICD-10-CM | POA: Diagnosis not present

## 2012-01-13 DIAGNOSIS — I10 Essential (primary) hypertension: Secondary | ICD-10-CM | POA: Diagnosis not present

## 2012-01-13 DIAGNOSIS — R197 Diarrhea, unspecified: Secondary | ICD-10-CM | POA: Diagnosis not present

## 2012-01-13 DIAGNOSIS — I2510 Atherosclerotic heart disease of native coronary artery without angina pectoris: Secondary | ICD-10-CM

## 2012-01-13 NOTE — Assessment & Plan Note (Signed)
Intolerant of statins Will hold off on Rx at her request

## 2012-01-13 NOTE — Assessment & Plan Note (Signed)
Controlled with probiotics and occ immodium

## 2012-01-13 NOTE — Assessment & Plan Note (Signed)
Has been quiet aggressively swims without problems On beta blocker, ACEI, asa

## 2012-01-13 NOTE — Assessment & Plan Note (Signed)
BP Readings from Last 3 Encounters:  01/13/12 130/80  07/15/11 156/88  06/08/11 150/78   Good control No changes needed Due for labs Lab Results  Component Value Date   CREATININE 1.1 01/13/2011

## 2012-01-13 NOTE — Progress Notes (Signed)
Subjective:    Patient ID: Jamie Hodges, female    DOB: 1927-09-30, 78 y.o.   MRN: [id number]  HPI Doing fine No new concerns  Diarrhea is better Taking probiotics daily Takes the immodium once a week or so  No chest pain No SOB No edema No dizziness or syncope Still swims aggressively---50 laps in about 40 minutes  Not interested in statin again Only occ cramps now---potassium will help  Current Outpatient Prescriptions on File Prior to Visit  Medication Sig Dispense Refill  . aspirin 81 MG tablet Take 81 mg by mouth daily.        . Loperamide HCl (IMODIUM A-D PO) Take by mouth as needed.        . metoprolol succinate (TOPROL-XL) 50 MG 24 hr tablet TAKE TWO TABLETS BY MOUTH TWICE DAILY  180 tablet  3  . Potassium (POTASSIMIN PO) Take by mouth as needed.        . ramipril (ALTACE) 10 MG capsule TAKE ONE CAPSULE BY MOUTH EVERY DAY  30 capsule  11    Allergies  Allergen Reactions  . Codeine Sulfate     REACTION: rash/ welps  . Penicillins     REACTION: rash and welps  . Crestor (Rosuvastatin Calcium)     myalgia    Past Medical History  Diagnosis Date  . Atrial fibrillation   . Hx of colonic polyps   . Hyperlipidemia   . Hypertension   . CAD (coronary artery disease)   . C. difficile colitis     3/10  Severe C. dif ---had brief atrial fib then. Cath shows some blockages but no intervention    Past Surgical History  Procedure Date  . Tonsillectomy and adenoidectomy     Family History  Problem Relation Age of Onset  . COPD Mother   . Coronary artery disease Neg Hx   . Diabetes Neg Hx   . Cancer Neg Hx     breast or colon cancer    History   Social History  . Marital Status: Married    Spouse Name: N/A    Number of Children: 3  . Years of Education: N/A   Occupational History  . retired 1st grade teacher    Social History Main Topics  . Smoking status: Former Smoker    Quit date: 08/16/1978  . Smokeless tobacco: Never Used  . Alcohol Use: Yes       white wine  . Drug Use: No  . Sexually Active: Not on file   Other Topics Concern  . Not on file   Social History Narrative   Has living will. Husband then son [name] (MD) to make health care decisions. Has DNR order in past and requests again   Review of Systems Stress with husband in hospital--lost a few pounds Sleeps fine Appetite is okay     Objective:   Physical Exam  Constitutional: She appears well-developed and well-nourished. No distress.  Neck: Normal range of motion. Neck supple. No thyromegaly present.  Cardiovascular: Normal rate, regular rhythm, normal heart sounds and intact distal pulses.  Exam reveals no gallop.   No murmur heard. Pulmonary/Chest: Effort normal and breath sounds normal. No respiratory distress. She has no wheezes. She has no rales.  Abdominal: Soft. There is no tenderness.  Musculoskeletal: She exhibits no edema and no tenderness.  Lymphadenopathy:    She has no cervical adenopathy.  Psychiatric: She has a normal mood and affect. Her behavior is normal. Thought content normal.  Assessment & Plan:

## 2012-01-14 LAB — BASIC METABOLIC PANEL
BUN: 20 mg/dL (ref 6–23)
Creatinine, Ser: 1 mg/dL (ref 0.4–1.2)
GFR: 57.72 mL/min — ABNORMAL LOW (ref >60.00)
Glucose, Bld: 88 mg/dL (ref 70–99)
Potassium: 4.8 mEq/L (ref 3.5–5.1)

## 2012-01-14 LAB — TSH: TSH: 2 u[IU]/mL (ref 0.35–5.50)

## 2012-01-14 LAB — CBC WITH DIFFERENTIAL/PLATELET
Basophils Absolute: 0 10*3/uL (ref 0.0–0.1)
HCT: 41.5 % (ref 36.0–46.0)
Lymphs Abs: 1.9 10*3/uL (ref 0.7–4.0)
MCV: 99.5 fl (ref 78.0–100.0)
Monocytes Absolute: 0.2 10*3/uL (ref 0.1–1.0)
Monocytes Relative: 4.2 % (ref 3.0–12.0)
Neutrophils Relative %: 61.1 % (ref 43.0–77.0)
Platelets: 239 10*3/uL (ref 150.0–400.0)
RDW: 13.3 % (ref 11.5–14.6)

## 2012-01-14 LAB — HEPATIC FUNCTION PANEL
Albumin: 4.3 g/dL (ref 3.5–5.2)
Total Bilirubin: 0.9 mg/dL (ref 0.3–1.2)

## 2012-01-18 ENCOUNTER — Encounter: Payer: Self-pay | Admitting: *Deleted

## 2012-02-23 DIAGNOSIS — L570 Actinic keratosis: Secondary | ICD-10-CM | POA: Diagnosis not present

## 2012-02-23 DIAGNOSIS — Z85828 Personal history of other malignant neoplasm of skin: Secondary | ICD-10-CM | POA: Diagnosis not present

## 2012-04-12 ENCOUNTER — Other Ambulatory Visit: Payer: Self-pay | Admitting: Internal Medicine

## 2012-06-13 DIAGNOSIS — Z23 Encounter for immunization: Secondary | ICD-10-CM | POA: Diagnosis not present

## 2012-06-29 ENCOUNTER — Other Ambulatory Visit: Payer: Self-pay | Admitting: Internal Medicine

## 2012-07-24 ENCOUNTER — Ambulatory Visit (INDEPENDENT_AMBULATORY_CARE_PROVIDER_SITE_OTHER): Payer: Medicare Other | Admitting: Internal Medicine

## 2012-07-24 ENCOUNTER — Encounter: Payer: Self-pay | Admitting: Internal Medicine

## 2012-07-24 VITALS — BP 136/76 | HR 76 | Ht 67.0 in | Wt 166.0 lb

## 2012-07-24 DIAGNOSIS — I10 Essential (primary) hypertension: Secondary | ICD-10-CM | POA: Diagnosis not present

## 2012-07-24 DIAGNOSIS — R197 Diarrhea, unspecified: Secondary | ICD-10-CM

## 2012-07-24 DIAGNOSIS — Z1331 Encounter for screening for depression: Secondary | ICD-10-CM

## 2012-07-24 DIAGNOSIS — I2510 Atherosclerotic heart disease of native coronary artery without angina pectoris: Secondary | ICD-10-CM | POA: Diagnosis not present

## 2012-07-24 DIAGNOSIS — K529 Noninfective gastroenteritis and colitis, unspecified: Secondary | ICD-10-CM

## 2012-07-24 NOTE — Assessment & Plan Note (Signed)
BP Readings from Last 3 Encounters:  07/24/12 136/76  01/13/12 130/80  07/15/11 156/88   Good control No changes needed BW next time

## 2012-07-24 NOTE — Assessment & Plan Note (Signed)
Mild non obstructive disease On ASA, beta blocker and ACEI Intolerant of statins Exercises vigorously and no change

## 2012-07-24 NOTE — Assessment & Plan Note (Signed)
Controlled with imodium No worrisome features

## 2012-07-24 NOTE — Progress Notes (Signed)
Subjective:    Patient ID: Jamie Hodges, female    DOB: Oct 05, 1927, 78 y.o.   MRN: [id number]  HPI Here for check up Feels she is doing well Mostly concerned about husband---has been slipping cognitively No persistent depression  Still able to control diarrhea with imodium Able to go out socially No abd pain  No blood in stool  No chest pain Still swims regularly and stamina is good No breathing problems No edema No palpitations  Still not interested in statins  Current Outpatient Prescriptions on File Prior to Visit  Medication Sig Dispense Refill  . aspirin 81 MG tablet Take 81 mg by mouth daily.        . Loperamide HCl (IMODIUM A-D PO) Take by mouth as needed.        . metoprolol succinate (TOPROL-XL) 50 MG 24 hr tablet TAKE TWO TABLETS BY MOUTH TWICE DAILY  180 tablet  2  . Potassium (POTASSIMIN PO) Take by mouth as needed.        . ramipril (ALTACE) 10 MG capsule TAKE ONE CAPSULE BY MOUTH EVERY DAY  30 capsule  11    Allergies  Allergen Reactions  . Codeine Sulfate     REACTION: rash/ welps  . Penicillins     REACTION: rash and welps  . Crestor (Rosuvastatin Calcium)     myalgia    Past Medical History  Diagnosis Date  . Atrial fibrillation 2010    One time during hospital while sick with severe diarrhea  . Hx of colonic polyps   . Hyperlipidemia   . Hypertension   . CAD (coronary artery disease)   . C. difficile colitis     3/10  Severe C. dif ---had brief atrial fib then. Cath shows some blockages but no intervention    Past Surgical History  Procedure Date  . Tonsillectomy and adenoidectomy     Family History  Problem Relation Age of Onset  . COPD Mother   . Coronary artery disease Neg Hx   . Diabetes Neg Hx   . Cancer Neg Hx     breast or colon cancer    History   Social History  . Marital Status: Married    Spouse Name: N/A    Number of Children: 3  . Years of Education: N/A   Occupational History  . retired 1st grade teacher     Social History Main Topics  . Smoking status: Former Smoker    Quit date: 08/16/1978  . Smokeless tobacco: Never Used  . Alcohol Use: Yes     Comment: white wine  . Drug Use: No  . Sexually Active: Not on file   Other Topics Concern  . Not on file   Social History Narrative   Has living will. Husband then son [name] (MD) to make health care decisions. Has DNR order in past and requests again   Review of Systems Appetite is fine Weight stable Sleeps okay    Objective:   Physical Exam  Constitutional: She appears well-developed and well-nourished. No distress.  Neck: Normal range of motion. Neck supple. No thyromegaly present.  Cardiovascular: Normal rate, regular rhythm and normal heart sounds.  Exam reveals no gallop.   No murmur heard. Pulmonary/Chest: Effort normal and breath sounds normal. No respiratory distress. She has no wheezes. She has no rales.  Abdominal: Soft. There is no tenderness.  Musculoskeletal: She exhibits no edema and no tenderness.  Lymphadenopathy:    She has no cervical adenopathy.  Psychiatric:  She has a normal mood and affect. Her behavior is normal.          Assessment & Plan:

## 2013-01-22 ENCOUNTER — Ambulatory Visit: Payer: Medicare Other | Admitting: Internal Medicine

## 2013-02-05 ENCOUNTER — Ambulatory Visit: Payer: Medicare Other | Admitting: Internal Medicine

## 2013-02-19 ENCOUNTER — Ambulatory Visit: Payer: Medicare Other | Admitting: Internal Medicine

## 2013-02-26 ENCOUNTER — Ambulatory Visit (INDEPENDENT_AMBULATORY_CARE_PROVIDER_SITE_OTHER): Payer: Medicare Other | Admitting: Internal Medicine

## 2013-02-26 ENCOUNTER — Encounter: Payer: Self-pay | Admitting: Internal Medicine

## 2013-02-26 VITALS — BP 140/72 | HR 76 | Wt 162.0 lb

## 2013-02-26 DIAGNOSIS — I10 Essential (primary) hypertension: Secondary | ICD-10-CM

## 2013-02-26 DIAGNOSIS — E785 Hyperlipidemia, unspecified: Secondary | ICD-10-CM

## 2013-02-26 LAB — CBC WITH DIFFERENTIAL/PLATELET
Basophils Relative: 0.4 % (ref 0.0–3.0)
Eosinophils Absolute: 0.1 10*3/uL (ref 0.0–0.7)
HCT: 40.9 % (ref 36.0–46.0)
Hemoglobin: 14.2 g/dL (ref 12.0–15.0)
Lymphs Abs: 1.7 10*3/uL (ref 0.7–4.0)
MCHC: 34.8 g/dL (ref 30.0–36.0)
MCV: 98.9 fl (ref 78.0–100.0)
Monocytes Absolute: 0.3 10*3/uL (ref 0.1–1.0)
Neutro Abs: 5.6 10*3/uL (ref 1.4–7.7)
RBC: 4.14 Mil/uL (ref 3.87–5.11)

## 2013-02-26 LAB — BASIC METABOLIC PANEL
CO2: 28 mEq/L (ref 19–32)
Chloride: 103 mEq/L (ref 96–112)
Sodium: 139 mEq/L (ref 135–145)

## 2013-02-26 LAB — HEPATIC FUNCTION PANEL
ALT: 15 U/L (ref 0–35)
Bilirubin, Direct: 0.1 mg/dL (ref 0.0–0.3)
Total Protein: 7.8 g/dL (ref 6.0–8.3)

## 2013-02-26 NOTE — Progress Notes (Signed)
Subjective:    Patient ID: Jamie Hodges, female    DOB: Oct 21, 1927, 79 y.o.   MRN: [id number]  HPI Here for check up Doing well  Reviewed cardiac history Had 1 episode of atrial fibrillation when sick with C dif  Had cath that showed non specific blockages---but not really CAD  Still swims regularly No change in exercise tolerance No chest pain  No SOB  Still having troubles with husband Memory is fairly stable but she feels he doesn't drink enough  Current Outpatient Prescriptions on File Prior to Visit  Medication Sig Dispense Refill  . aspirin 81 MG tablet Take 81 mg by mouth daily.        . Loperamide HCl (IMODIUM A-D PO) Take by mouth as needed.        . metoprolol succinate (TOPROL-XL) 50 MG 24 hr tablet TAKE TWO TABLETS BY MOUTH TWICE DAILY  180 tablet  2  . Potassium (POTASSIMIN PO) Take by mouth as needed.        . ramipril (ALTACE) 10 MG capsule TAKE ONE CAPSULE BY MOUTH EVERY DAY  30 capsule  11   No current facility-administered medications on file prior to visit.    Allergies  Allergen Reactions  . Codeine Sulfate     REACTION: rash/ welps  . Penicillins     REACTION: rash and welps  . Crestor (Rosuvastatin Calcium)     myalgia    Past Medical History  Diagnosis Date  . Atrial fibrillation 2010    One time during hospital while sick with severe diarrhea  . Hx of colonic polyps   . Hyperlipidemia   . Hypertension   . CAD (coronary artery disease) 2010    minor blockages --no intervention indicated  . C. difficile colitis     3/10  Severe C. dif ---had brief atrial fib then. Cath shows some blockages but no intervention    Past Surgical History  Procedure Laterality Date  . Tonsillectomy and adenoidectomy      Family History  Problem Relation Age of Onset  . COPD Mother   . Coronary artery disease Neg Hx   . Diabetes Neg Hx   . Cancer Neg Hx     breast or colon cancer    History   Social History  . Marital Status: Married    Spouse  Name: N/A    Number of Children: 3  . Years of Education: N/A   Occupational History  . retired 1st grade teacher    Social History Main Topics  . Smoking status: Former Smoker    Quit date: 08/16/1978  . Smokeless tobacco: Never Used  . Alcohol Use: Yes     Comment: white wine  . Drug Use: No  . Sexually Active: Not on file   Other Topics Concern  . Not on file   Social History Narrative   Has living will. Husband then son [name] (MD) to make health care decisions. Has DNR order in past and requests again         Review of Systems Appetite is good Weight stable Sleeps well    Objective:   Physical Exam  Constitutional: She appears well-developed and well-nourished. No distress.  Neck: Normal range of motion. Neck supple. No thyromegaly present.  Cardiovascular: Normal rate, regular rhythm, normal heart sounds and intact distal pulses.  Exam reveals no gallop.   No murmur heard. Pulmonary/Chest: Effort normal and breath sounds normal. No respiratory distress. She has no rales.  Abdominal:  Soft. There is no tenderness.  Musculoskeletal: She exhibits no edema and no tenderness.  Lymphadenopathy:    She has no cervical adenopathy.  Psychiatric: She has a normal mood and affect. Her behavior is normal.          Assessment & Plan:

## 2013-02-26 NOTE — Assessment & Plan Note (Signed)
Not interested in primary prevention 

## 2013-02-26 NOTE — Assessment & Plan Note (Signed)
BP Readings from Last 3 Encounters:  02/26/13 140/72  07/24/12 136/76  01/13/12 130/80   Good control Due for labs

## 2013-02-27 ENCOUNTER — Encounter: Payer: Self-pay | Admitting: *Deleted

## 2013-03-12 DIAGNOSIS — Z85828 Personal history of other malignant neoplasm of skin: Secondary | ICD-10-CM | POA: Diagnosis not present

## 2013-03-12 DIAGNOSIS — L570 Actinic keratosis: Secondary | ICD-10-CM | POA: Diagnosis not present

## 2013-03-19 DIAGNOSIS — B029 Zoster without complications: Secondary | ICD-10-CM | POA: Diagnosis not present

## 2013-03-19 DIAGNOSIS — L0211 Cutaneous abscess of neck: Secondary | ICD-10-CM | POA: Diagnosis not present

## 2013-03-20 DIAGNOSIS — H6120 Impacted cerumen, unspecified ear: Secondary | ICD-10-CM | POA: Diagnosis not present

## 2013-03-20 DIAGNOSIS — H905 Unspecified sensorineural hearing loss: Secondary | ICD-10-CM | POA: Diagnosis not present

## 2013-03-20 DIAGNOSIS — B029 Zoster without complications: Secondary | ICD-10-CM | POA: Diagnosis not present

## 2013-03-26 DIAGNOSIS — B029 Zoster without complications: Secondary | ICD-10-CM | POA: Diagnosis not present

## 2013-04-10 ENCOUNTER — Other Ambulatory Visit: Payer: Self-pay | Admitting: Internal Medicine

## 2013-04-23 ENCOUNTER — Other Ambulatory Visit: Payer: Self-pay | Admitting: Internal Medicine

## 2013-04-24 ENCOUNTER — Emergency Department: Payer: Self-pay | Admitting: Emergency Medicine

## 2013-04-24 DIAGNOSIS — R42 Dizziness and giddiness: Secondary | ICD-10-CM | POA: Diagnosis not present

## 2013-04-24 DIAGNOSIS — I10 Essential (primary) hypertension: Secondary | ICD-10-CM | POA: Diagnosis not present

## 2013-04-24 DIAGNOSIS — R110 Nausea: Secondary | ICD-10-CM | POA: Diagnosis not present

## 2013-04-24 DIAGNOSIS — Z880 Allergy status to penicillin: Secondary | ICD-10-CM | POA: Diagnosis not present

## 2013-04-24 DIAGNOSIS — N390 Urinary tract infection, site not specified: Secondary | ICD-10-CM | POA: Diagnosis not present

## 2013-04-24 LAB — URINALYSIS, COMPLETE
Bacteria: NONE SEEN
Bilirubin,UR: NEGATIVE
Blood: NEGATIVE
Ph: 5 (ref 4.5–8.0)
Specific Gravity: 1.019 (ref 1.003–1.030)
Squamous Epithelial: 1

## 2013-04-24 LAB — COMPREHENSIVE METABOLIC PANEL
Anion Gap: 7 (ref 7–16)
Bilirubin,Total: 1 mg/dL (ref 0.2–1.0)
Calcium, Total: 8.9 mg/dL (ref 8.5–10.1)
Chloride: 102 mmol/L (ref 98–107)
Co2: 27 mmol/L (ref 21–32)
Creatinine: 0.97 mg/dL (ref 0.60–1.30)
Glucose: 114 mg/dL — ABNORMAL HIGH (ref 65–99)
Osmolality: 275 (ref 275–301)
SGOT(AST): 32 U/L (ref 15–37)
SGPT (ALT): 26 U/L (ref 12–78)
Sodium: 136 mmol/L (ref 136–145)
Total Protein: 7 g/dL (ref 6.4–8.2)

## 2013-04-24 LAB — CBC
HCT: 39.5 % (ref 35.0–47.0)
HGB: 13.7 g/dL (ref 12.0–16.0)
MCHC: 34.7 g/dL (ref 32.0–36.0)
MCV: 100 fL (ref 80–100)
Platelet: 215 10*3/uL (ref 150–440)
RBC: 3.97 10*6/uL (ref 3.80–5.20)
WBC: 7 10*3/uL (ref 3.6–11.0)

## 2013-04-24 LAB — TROPONIN I: Troponin-I: <0.02 ng/mL

## 2013-04-26 LAB — URINE CULTURE

## 2013-04-30 DIAGNOSIS — H903 Sensorineural hearing loss, bilateral: Secondary | ICD-10-CM | POA: Diagnosis not present

## 2013-05-07 DIAGNOSIS — L570 Actinic keratosis: Secondary | ICD-10-CM | POA: Diagnosis not present

## 2013-05-07 DIAGNOSIS — B009 Herpesviral infection, unspecified: Secondary | ICD-10-CM | POA: Diagnosis not present

## 2013-05-11 ENCOUNTER — Encounter: Payer: Self-pay | Admitting: Internal Medicine

## 2013-05-11 ENCOUNTER — Ambulatory Visit (INDEPENDENT_AMBULATORY_CARE_PROVIDER_SITE_OTHER): Payer: Medicare Other | Admitting: Internal Medicine

## 2013-05-11 VITALS — BP 148/70 | HR 76 | Wt 162.0 lb

## 2013-05-11 DIAGNOSIS — H8110 Benign paroxysmal vertigo, unspecified ear: Secondary | ICD-10-CM | POA: Insufficient documentation

## 2013-05-11 MED ORDER — MECLIZINE HCL 25 MG PO TABS: 25.0000 mg | ORAL_TABLET | Freq: Three times a day (TID) | ORAL | Status: DC | PRN

## 2013-05-11 NOTE — Assessment & Plan Note (Signed)
Fairly classic presentation No UTI--discussed  Better now No neuro findings Will give Rx for meclizine for prn use

## 2013-05-11 NOTE — Progress Notes (Signed)
Subjective:    Patient ID: Jamie Hodges, female    DOB: Feb 11, 1928, 79 y.o.   MRN: [id number]  HPI Was in ER on 9/9 Was at church and started to feel dizzy Drove home---- by then she was worse Spinning and nausea  Reviewed ER records Got fluids--was feeling better eventually Diagnosed with "mild UTI" and given medication. Given antibiotics  No recurrence since then  Current Outpatient Prescriptions on File Prior to Visit  Medication Sig Dispense Refill  . aspirin 81 MG tablet Take 81 mg by mouth daily.        . Loperamide HCl (IMODIUM A-D PO) Take by mouth as needed.        . metoprolol succinate (TOPROL-XL) 50 MG 24 hr tablet TAKE TWO TABLETS BY MOUTH TWICE DAILY  180 tablet  0  . Potassium (POTASSIMIN PO) Take by mouth as needed.        . ramipril (ALTACE) 10 MG capsule TAKE ONE CAPSULE BY MOUTH EVERY DAY  30 capsule  6   No current facility-administered medications on file prior to visit.    Allergies  Allergen Reactions  . Codeine Sulfate     REACTION: rash/ welps  . Penicillins     REACTION: rash and welps  . Crestor [Rosuvastatin Calcium]     myalgia    Past Medical History  Diagnosis Date  . Atrial fibrillation 2010    One time during hospital while sick with severe diarrhea  . Hx of colonic polyps   . Hyperlipidemia   . Hypertension   . CAD (coronary artery disease) 2010    minor blockages --no intervention indicated  . C. difficile colitis     3/10  Severe C. dif ---had brief atrial fib then. Cath shows some blockages but no intervention    Past Surgical History  Procedure Laterality Date  . Tonsillectomy and adenoidectomy      Family History  Problem Relation Age of Onset  . COPD Mother   . Coronary artery disease Neg Hx   . Diabetes Neg Hx   . Cancer Neg Hx     breast or colon cancer    History   Social History  . Marital Status: Married    Spouse Name: N/A    Number of Children: 3  . Years of Education: N/A   Occupational History  .  retired 1st grade teacher    Social History Main Topics  . Smoking status: Former Smoker    Quit date: 08/16/1978  . Smokeless tobacco: Never Used  . Alcohol Use: Yes     Comment: white wine  . Drug Use: No  . Sexual Activity: Not on file   Other Topics Concern  . Not on file   Social History Narrative   Has living will. Husband then son [name] (MD) to make health care decisions. Has DNR order in past and requests again         Review of Systems No hearing loss or tinnitus Had shingles 8/11-- got Rx and shot [facility]) Having some leg cramps---trying mag ox (not helping) Remains physically active Concerned about husband's worsening dementia    Objective:   Physical Exam  Constitutional: She appears well-developed and well-nourished. No distress.  Neck: Normal range of motion. Neck supple. No thyromegaly present.  Cardiovascular: Normal rate, regular rhythm and normal heart sounds.  Exam reveals no gallop.   No murmur heard. Pulmonary/Chest: Effort normal and breath sounds normal. No respiratory distress. She has no wheezes. She  has no rales.  Musculoskeletal: She exhibits no edema and no tenderness.  Lymphadenopathy:    She has no cervical adenopathy.  Neurological: She is alert. She has normal strength. She displays no tremor. No cranial nerve deficit. She exhibits normal muscle tone. She displays a negative Romberg sign. Coordination and gait normal.          Assessment & Plan:

## 2013-05-11 NOTE — Patient Instructions (Signed)
Please use the meclizine three times a day if the vertigo resumes. Then slowly come off it as your symptoms improve

## 2013-06-14 DIAGNOSIS — Z23 Encounter for immunization: Secondary | ICD-10-CM | POA: Diagnosis not present

## 2013-06-18 DIAGNOSIS — D485 Neoplasm of uncertain behavior of skin: Secondary | ICD-10-CM | POA: Diagnosis not present

## 2013-06-18 DIAGNOSIS — D2360 Other benign neoplasm of skin of unspecified upper limb, including shoulder: Secondary | ICD-10-CM | POA: Diagnosis not present

## 2013-06-18 DIAGNOSIS — L609 Nail disorder, unspecified: Secondary | ICD-10-CM | POA: Diagnosis not present

## 2013-06-18 DIAGNOSIS — C44611 Basal cell carcinoma of skin of unspecified upper limb, including shoulder: Secondary | ICD-10-CM | POA: Diagnosis not present

## 2013-07-02 DIAGNOSIS — L570 Actinic keratosis: Secondary | ICD-10-CM | POA: Diagnosis not present

## 2013-07-02 DIAGNOSIS — Z85828 Personal history of other malignant neoplasm of skin: Secondary | ICD-10-CM | POA: Diagnosis not present

## 2013-07-02 DIAGNOSIS — C44611 Basal cell carcinoma of skin of unspecified upper limb, including shoulder: Secondary | ICD-10-CM | POA: Diagnosis not present

## 2013-07-26 ENCOUNTER — Other Ambulatory Visit: Payer: Self-pay | Admitting: Internal Medicine

## 2013-10-29 ENCOUNTER — Other Ambulatory Visit: Payer: Self-pay | Admitting: Internal Medicine

## 2013-11-13 ENCOUNTER — Other Ambulatory Visit: Payer: Self-pay

## 2013-11-13 MED ORDER — METOPROLOL SUCCINATE ER 50 MG PO TB24: ORAL_TABLET | ORAL | Status: DC

## 2013-11-13 MED ORDER — RAMIPRIL 10 MG PO CAPS: ORAL_CAPSULE | ORAL | Status: DC

## 2013-11-13 NOTE — Telephone Encounter (Signed)
Pt request refill [facility]ipril [facility];advised done.

## 2013-12-06 DIAGNOSIS — Z85828 Personal history of other malignant neoplasm of skin: Secondary | ICD-10-CM | POA: Diagnosis not present

## 2013-12-06 DIAGNOSIS — Z85820 Personal history of malignant melanoma of skin: Secondary | ICD-10-CM | POA: Diagnosis not present

## 2014-01-10 DIAGNOSIS — Z85828 Personal history of other malignant neoplasm of skin: Secondary | ICD-10-CM | POA: Diagnosis not present

## 2014-01-10 DIAGNOSIS — C44529 Squamous cell carcinoma of skin of other part of trunk: Secondary | ICD-10-CM | POA: Diagnosis not present

## 2014-01-10 DIAGNOSIS — D485 Neoplasm of uncertain behavior of skin: Secondary | ICD-10-CM | POA: Diagnosis not present

## 2014-02-03 IMAGING — CT CT HEAD WITHOUT CONTRAST
1 series · 16 of 30 positions shown, 20 images · non-contrast
Comparison: none

REASON FOR EXAM: dizziness
COMMENTS:

PROCEDURE:     CT  - CT HEAD WITHOUT CONTRAST  - April 24, 2013  [DATE]
RESULT:     Technique: Helical noncontrasted 5 mm sections were obtained
from the skull base through the vertex.

[Series 2: soft tissue · axial · 0.42mm/px · z∈[+298,+438]mm · 16 of 32 slices shown, 20 images]
[im 2/32  brain]
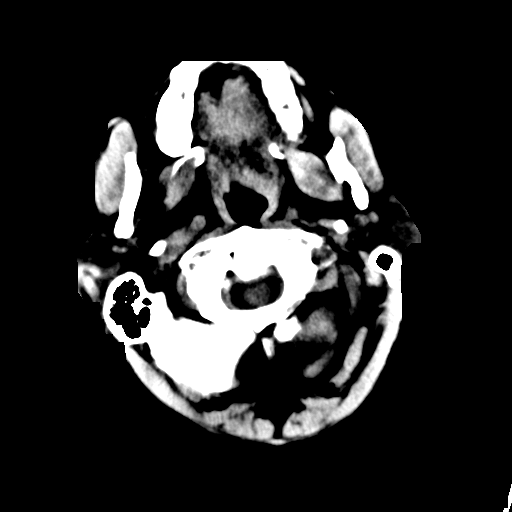
[im 2/32  bone]
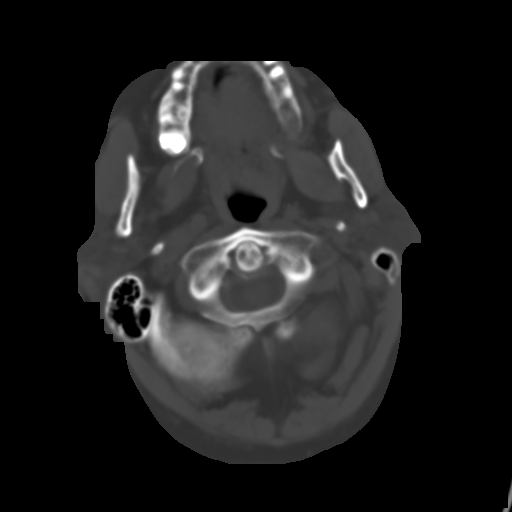
[im 4/32  brain]
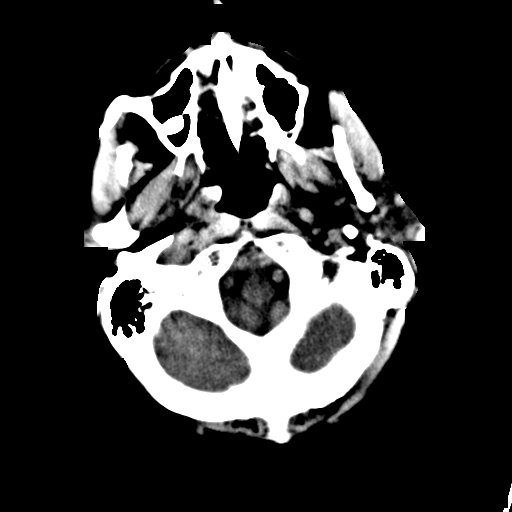
[im 6/32  brain]
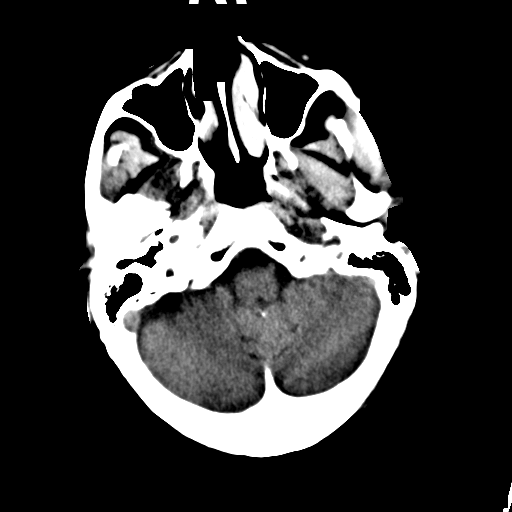
[im 8/32  brain]
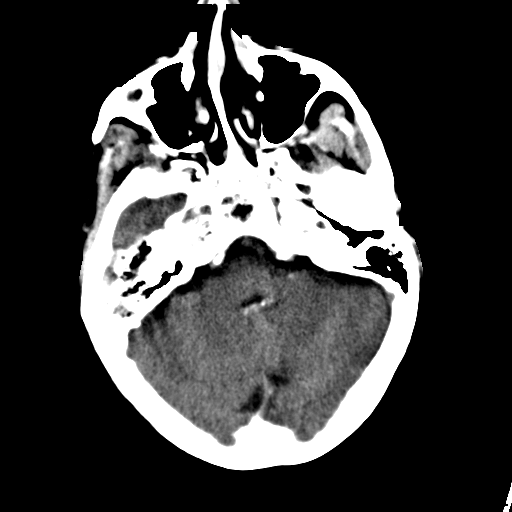
[im 9/32  brain]
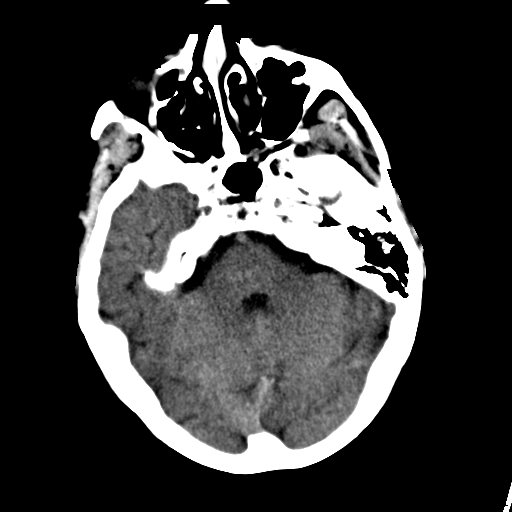
[im 9/32  bone]
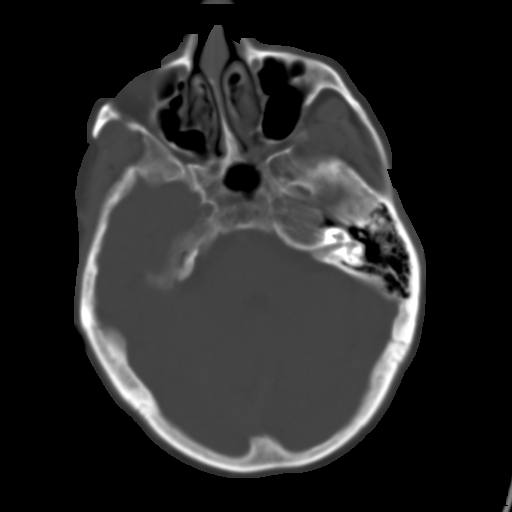
[im 11/32  brain]
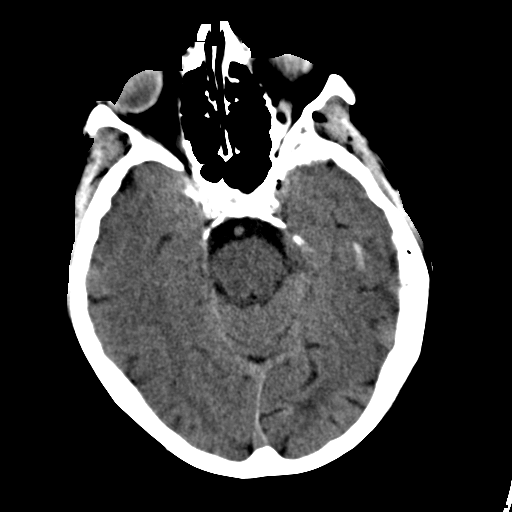
[im 13/32  brain]
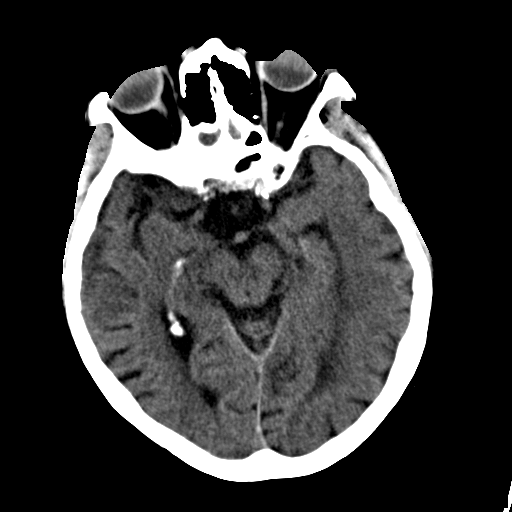
[im 15/32  brain]
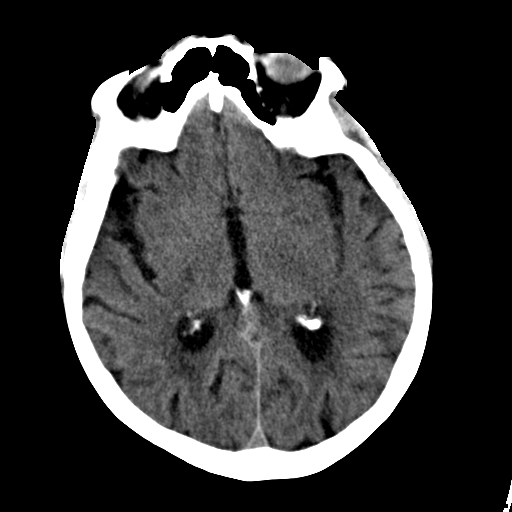
[im 17/32  brain]
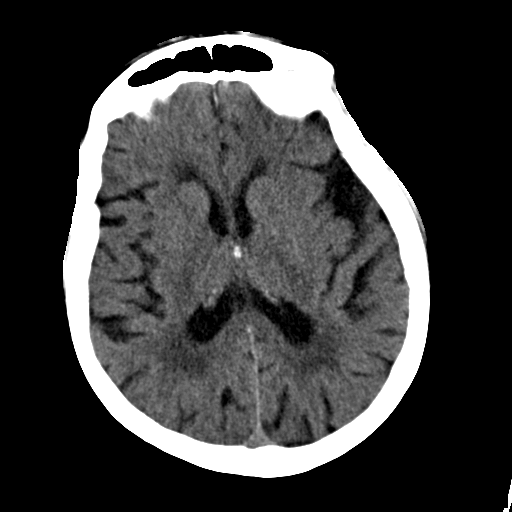
[im 17/32  bone]
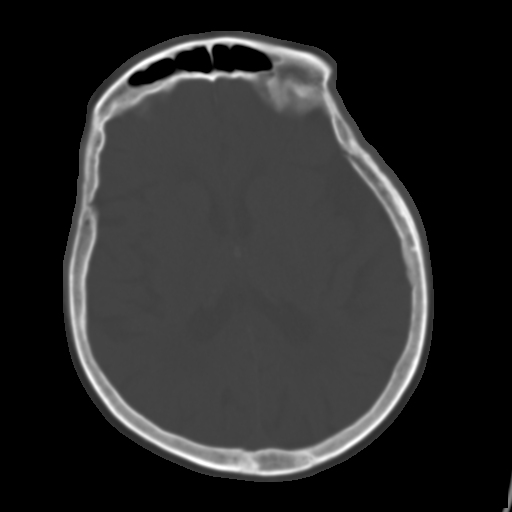
[im 19/32  brain]
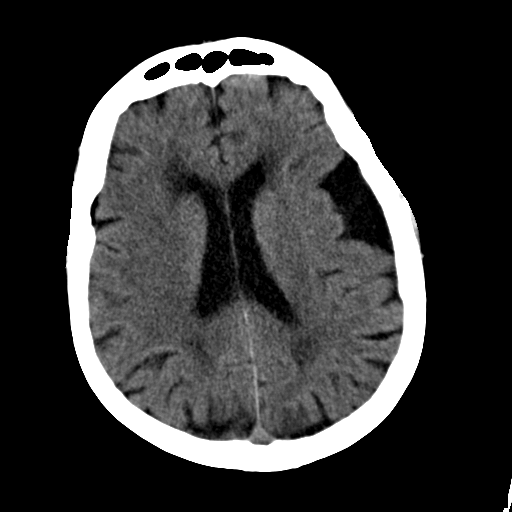
[im 21/32  brain]
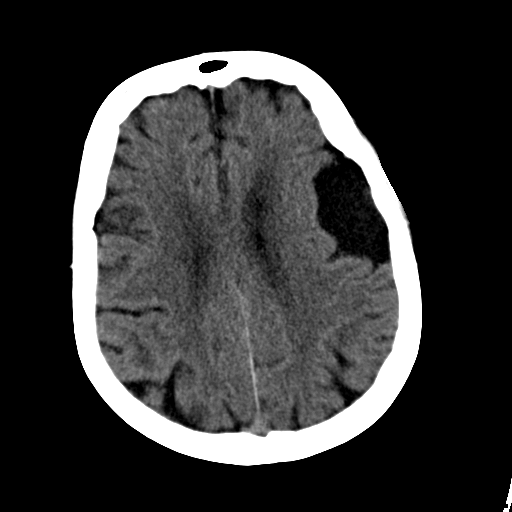
[im 23/32  brain]
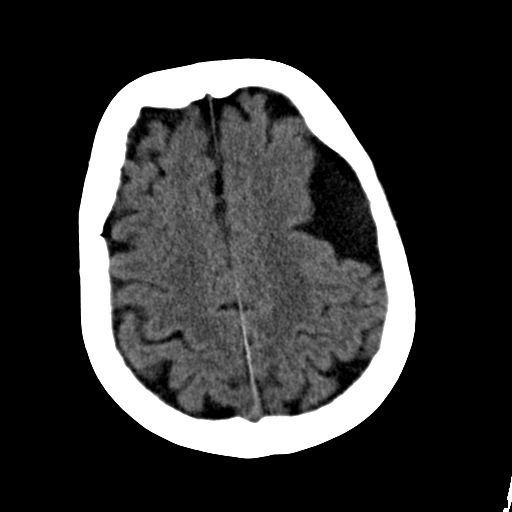
[im 24/32  brain]
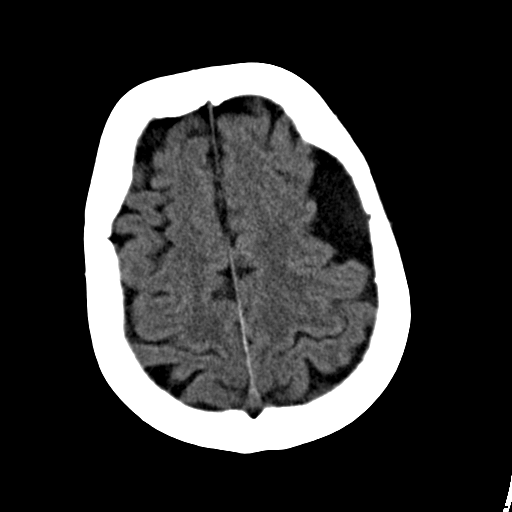
[im 24/32  bone]
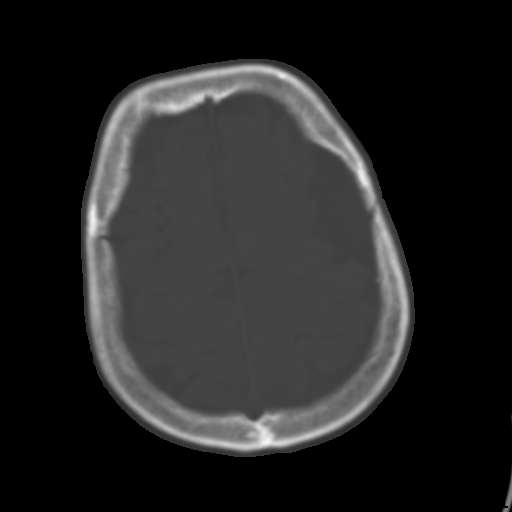
[im 26/32  brain]
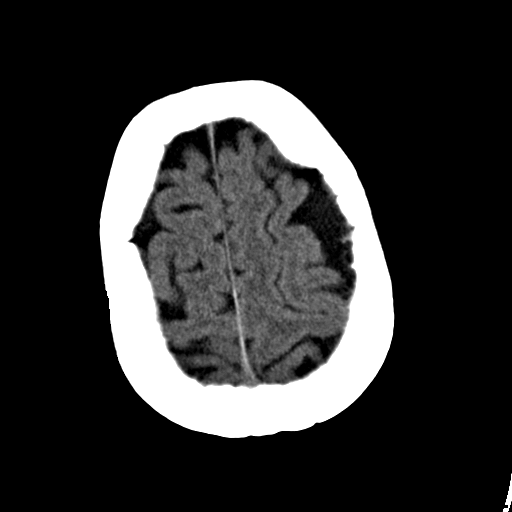
[im 28/32  brain]
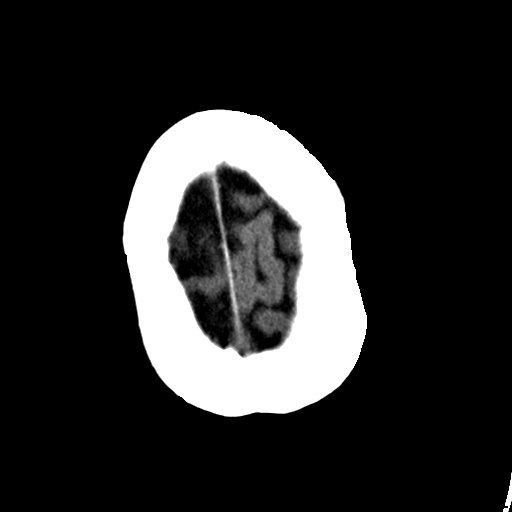
[im 30/32  brain]
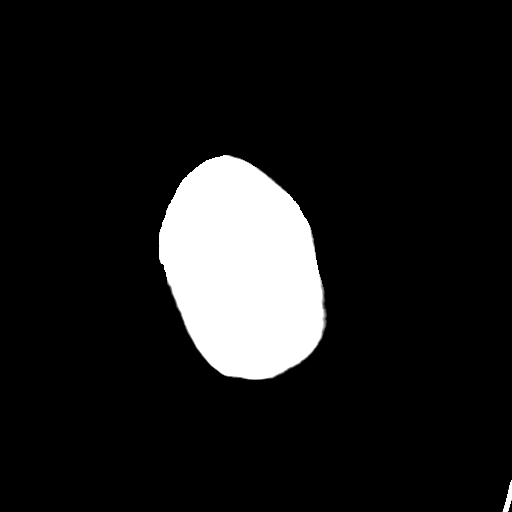

[16 of 30 positions shown; findings below may reference images not displayed]

FINDINGS: Diffuse cortical and cerebellar atrophy is identified as well as
diffuse areas of low attenuation within the subcortical, deep and
periventricular white matter regions. There is not evidence of intra-axial
nor extra-axial fluid collections, acute hemorrhage, mass effect, nor a
depressed skull fracture. The visualized paranasal sinuses and mastoid air
cells are patent. The there findings consistent with an area of
encephalomalacic change versus an arachnoid cyst within the frontoparietal
region a left. Alternatively an area of incomplete migration disorder is
also of diagnostic consideration.
IMPRESSION: Chronic and involutional changes without evidence of acute
abnormalities. If there is persistent clinical concern further evaluation
with MRI is recommended.

## 2014-02-27 ENCOUNTER — Telehealth: Payer: Self-pay | Admitting: Internal Medicine

## 2014-02-27 ENCOUNTER — Ambulatory Visit (INDEPENDENT_AMBULATORY_CARE_PROVIDER_SITE_OTHER): Payer: Medicare Other | Admitting: Internal Medicine

## 2014-02-27 ENCOUNTER — Encounter: Payer: Self-pay | Admitting: Internal Medicine

## 2014-02-27 VITALS — BP 138/60 | HR 85 | Temp 98.6°F | Ht 67.0 in | Wt 158.0 lb

## 2014-02-27 DIAGNOSIS — E785 Hyperlipidemia, unspecified: Secondary | ICD-10-CM

## 2014-02-27 DIAGNOSIS — H8110 Benign paroxysmal vertigo, unspecified ear: Secondary | ICD-10-CM

## 2014-02-27 DIAGNOSIS — I10 Essential (primary) hypertension: Secondary | ICD-10-CM | POA: Diagnosis not present

## 2014-02-27 DIAGNOSIS — Z7189 Other specified counseling: Secondary | ICD-10-CM | POA: Diagnosis not present

## 2014-02-27 DIAGNOSIS — K529 Noninfective gastroenteritis and colitis, unspecified: Secondary | ICD-10-CM

## 2014-02-27 DIAGNOSIS — R197 Diarrhea, unspecified: Secondary | ICD-10-CM | POA: Diagnosis not present

## 2014-02-27 LAB — COMPREHENSIVE METABOLIC PANEL
ALT: 14 U/L (ref 0–35)
AST: 20 U/L (ref 0–37)
Albumin: 4.2 g/dL (ref 3.5–5.2)
Alkaline Phosphatase: 60 U/L (ref 39–117)
BILIRUBIN TOTAL: 1.1 mg/dL (ref 0.2–1.2)
BUN: 17 mg/dL (ref 6–23)
CALCIUM: 9.6 mg/dL (ref 8.4–10.5)
CHLORIDE: 103 meq/L (ref 96–112)
CO2: 32 mEq/L (ref 19–32)
Creatinine, Ser: 0.9 mg/dL (ref 0.4–1.2)
GFR: 64.18 mL/min (ref >60.00)
Glucose, Bld: 88 mg/dL (ref 70–99)
Potassium: 4.7 mEq/L (ref 3.5–5.1)
Sodium: 140 mEq/L (ref 135–145)
Total Protein: 7 g/dL (ref 6.0–8.3)

## 2014-02-27 LAB — CBC WITH DIFFERENTIAL/PLATELET
Basophils Absolute: 0 10*3/uL (ref 0.0–0.1)
Basophils Relative: 0.4 % (ref 0.0–3.0)
EOS ABS: 0.1 10*3/uL (ref 0.0–0.7)
Eosinophils Relative: 1.8 % (ref 0.0–5.0)
HEMATOCRIT: 40.1 % (ref 36.0–46.0)
Hemoglobin: 13.8 g/dL (ref 12.0–15.0)
LYMPHS PCT: 26.9 % (ref 12.0–46.0)
Lymphs Abs: 1.6 10*3/uL (ref 0.7–4.0)
MCHC: 34.3 g/dL (ref 30.0–36.0)
MCV: 96.9 fl (ref 78.0–100.0)
MONOS PCT: 4.8 % (ref 3.0–12.0)
Monocytes Absolute: 0.3 10*3/uL (ref 0.1–1.0)
NEUTROS ABS: 4 10*3/uL (ref 1.4–7.7)
Neutrophils Relative %: 66.1 % (ref 43.0–77.0)
Platelets: 252 10*3/uL (ref 150.0–400.0)
RBC: 4.14 Mil/uL (ref 3.87–5.11)
RDW: 13.4 % (ref 11.5–15.5)
WBC: 6 10*3/uL (ref 4.0–10.5)

## 2014-02-27 LAB — T4, FREE: Free T4: 0.66 ng/dL (ref 0.60–1.60)

## 2014-02-27 MED ORDER — METOPROLOL SUCCINATE ER 100 MG PO TB24: 100.0000 mg | ORAL_TABLET | Freq: Every day | ORAL | Status: DC

## 2014-02-27 NOTE — Telephone Encounter (Signed)
Relevant patient education assigned to patient using Emmi. ° °

## 2014-02-27 NOTE — Assessment & Plan Note (Signed)
Still prefers no primary prevention with statin 

## 2014-02-27 NOTE — Progress Notes (Signed)
Pre visit review using our clinic review tool, if applicable. No additional management support is needed unless otherwise documented below in the visit note. 

## 2014-02-27 NOTE — Assessment & Plan Note (Signed)
Occasional symptoms-- the meclizine helps

## 2014-02-27 NOTE — Assessment & Plan Note (Signed)
BP Readings from Last 3 Encounters:  02/27/14 138/60  05/11/13 148/70  02/26/13 140/72   Good control Due for labs

## 2014-02-27 NOTE — Assessment & Plan Note (Signed)
See social history 

## 2014-02-27 NOTE — Assessment & Plan Note (Signed)
Does okay with the prn imodium

## 2014-02-27 NOTE — Progress Notes (Signed)
Subjective:    Patient ID: Jamie Hodges, female    DOB: 1928-08-01, 80 y.o.   MRN: [id number]  HPI Doing well Reviewed advanced directives Stays active daily Socially engaged Still stress with husband's memory decline  Wonders about his BP meds No side effects though No chest pain No palpitations No change in exercise tolerance No syncope  Has had recurrence of vertigo The meclizine helps No focal weakness, facial droop or speech changes  Still opposed to any cholesterol meds  Current Outpatient Prescriptions on File Prior to Visit  Medication Sig Dispense Refill  . aspirin 81 MG tablet Take 81 mg by mouth daily.        . Loperamide HCl (IMODIUM A-D PO) Take by mouth as needed.        . meclizine (ANTIVERT) 25 MG tablet Take 1 tablet (25 mg total) by mouth 3 (three) times daily as needed for dizziness or nausea.  60 tablet  0  . Potassium (POTASSIMIN PO) Take by mouth as needed.        . ramipril (ALTACE) 10 MG capsule TAKE ONE CAPSULE BY MOUTH EVERY DAY  90 capsule  1   No current facility-administered medications on file prior to visit.    Allergies  Allergen Reactions  . Codeine Sulfate     REACTION: rash/ welps  . Penicillins     REACTION: rash and welps  . Crestor [Rosuvastatin Calcium]     myalgia    Past Medical History  Diagnosis Date  . Atrial fibrillation 2010    One time during hospital while sick with severe diarrhea  . Hx of colonic polyps   . Hyperlipidemia   . Hypertension   . CAD (coronary artery disease) 2010    minor blockages --no intervention indicated  . C. difficile colitis     3/10  Severe C. dif ---had brief atrial fib then. Cath shows some blockages but no intervention    Past Surgical History  Procedure Laterality Date  . Tonsillectomy and adenoidectomy      Family History  Problem Relation Age of Onset  . COPD Mother   . Coronary artery disease Neg Hx   . Diabetes Neg Hx   . Cancer Neg Hx     breast or colon cancer     History   Social History  . Marital Status: Married    Spouse Name: N/A    Number of Children: 3  . Years of Education: N/A   Occupational History  . retired 1st grade teacher    Social History Main Topics  . Smoking status: Former Smoker    Quit date: 08/16/1978  . Smokeless tobacco: Never Used  . Alcohol Use: Yes     Comment: white wine  . Drug Use: No  . Sexual Activity: Not on file   Other Topics Concern  . Not on file   Social History Narrative   Has living will.    Husband then son Mikki Santee (MD) to make health care decisions.   Has DNR order in past and requests again--done   No tube feeds if cognitively unaware            Review of Systems Sleeps well Bowels are okay--still needs imodium at times for loose stools Appetite is fine Weight is stable Voids fine--no sig trouble with incontinence    Objective:   Physical Exam  Constitutional: She is oriented to person, place, and time. She appears well-developed and well-nourished. No distress.  Neck:  Normal range of motion. Neck supple. No thyromegaly present.  Cardiovascular: Normal rate, regular rhythm, normal heart sounds and intact distal pulses.  Exam reveals no gallop.   No murmur heard. Pulmonary/Chest: Effort normal and breath sounds normal. No respiratory distress. She has no wheezes. She has no rales.  Abdominal: Soft. There is no tenderness.  Musculoskeletal: She exhibits no edema and no tenderness.  Lymphadenopathy:    She has no cervical adenopathy.  Neurological: She is alert and oriented to person, place, and time.  Skin: No rash noted. No erythema.  Psychiatric: She has a normal mood and affect. Her behavior is normal.          Assessment & Plan:

## 2014-02-28 ENCOUNTER — Encounter: Payer: Self-pay | Admitting: *Deleted

## 2014-03-22 ENCOUNTER — Other Ambulatory Visit: Payer: Self-pay | Admitting: Internal Medicine

## 2014-05-25 ENCOUNTER — Other Ambulatory Visit: Payer: Self-pay | Admitting: Internal Medicine

## 2014-06-06 DIAGNOSIS — Z23 Encounter for immunization: Secondary | ICD-10-CM | POA: Diagnosis not present

## 2014-10-12 DIAGNOSIS — K2900 Acute gastritis without bleeding: Secondary | ICD-10-CM | POA: Diagnosis not present

## 2014-10-12 DIAGNOSIS — R110 Nausea: Secondary | ICD-10-CM | POA: Diagnosis not present

## 2014-10-13 ENCOUNTER — Inpatient Hospital Stay: Payer: Self-pay | Admitting: Surgery

## 2014-10-13 DIAGNOSIS — K8070 Calculus of gallbladder and bile duct without cholecystitis without obstruction: Secondary | ICD-10-CM | POA: Diagnosis not present

## 2014-10-13 DIAGNOSIS — R1084 Generalized abdominal pain: Secondary | ICD-10-CM | POA: Diagnosis not present

## 2014-10-13 DIAGNOSIS — K831 Obstruction of bile duct: Secondary | ICD-10-CM | POA: Diagnosis not present

## 2014-10-13 DIAGNOSIS — K839 Disease of biliary tract, unspecified: Secondary | ICD-10-CM | POA: Diagnosis not present

## 2014-10-13 DIAGNOSIS — Z885 Allergy status to narcotic agent status: Secondary | ICD-10-CM | POA: Diagnosis not present

## 2014-10-13 DIAGNOSIS — K8050 Calculus of bile duct without cholangitis or cholecystitis without obstruction: Secondary | ICD-10-CM | POA: Diagnosis not present

## 2014-10-13 DIAGNOSIS — K810 Acute cholecystitis: Secondary | ICD-10-CM | POA: Diagnosis not present

## 2014-10-13 DIAGNOSIS — K2970 Gastritis, unspecified, without bleeding: Secondary | ICD-10-CM | POA: Diagnosis not present

## 2014-10-13 DIAGNOSIS — K821 Hydrops of gallbladder: Secondary | ICD-10-CM | POA: Diagnosis not present

## 2014-10-13 DIAGNOSIS — I10 Essential (primary) hypertension: Secondary | ICD-10-CM | POA: Diagnosis not present

## 2014-10-13 DIAGNOSIS — K219 Gastro-esophageal reflux disease without esophagitis: Secondary | ICD-10-CM | POA: Diagnosis not present

## 2014-10-13 DIAGNOSIS — K819 Cholecystitis, unspecified: Secondary | ICD-10-CM | POA: Diagnosis not present

## 2014-10-13 DIAGNOSIS — Z881 Allergy status to other antibiotic agents status: Secondary | ICD-10-CM | POA: Diagnosis not present

## 2014-10-13 DIAGNOSIS — R109 Unspecified abdominal pain: Secondary | ICD-10-CM | POA: Diagnosis not present

## 2014-10-13 DIAGNOSIS — K8000 Calculus of gallbladder with acute cholecystitis without obstruction: Secondary | ICD-10-CM | POA: Diagnosis not present

## 2014-10-13 DIAGNOSIS — R112 Nausea with vomiting, unspecified: Secondary | ICD-10-CM | POA: Diagnosis not present

## 2014-10-13 DIAGNOSIS — K828 Other specified diseases of gallbladder: Secondary | ICD-10-CM | POA: Diagnosis not present

## 2014-10-13 DIAGNOSIS — R7989 Other specified abnormal findings of blood chemistry: Secondary | ICD-10-CM | POA: Diagnosis not present

## 2014-10-13 DIAGNOSIS — A419 Sepsis, unspecified organism: Secondary | ICD-10-CM | POA: Diagnosis not present

## 2014-10-13 DIAGNOSIS — Z79899 Other long term (current) drug therapy: Secondary | ICD-10-CM | POA: Diagnosis not present

## 2014-10-13 DIAGNOSIS — Z880 Allergy status to penicillin: Secondary | ICD-10-CM | POA: Diagnosis not present

## 2014-10-13 DIAGNOSIS — Z7982 Long term (current) use of aspirin: Secondary | ICD-10-CM | POA: Diagnosis not present

## 2014-10-13 DIAGNOSIS — K8063 Calculus of gallbladder and bile duct with acute cholecystitis with obstruction: Secondary | ICD-10-CM | POA: Diagnosis not present

## 2014-10-15 HISTORY — PX: CHOLECYSTECTOMY: SHX55

## 2014-11-05 ENCOUNTER — Ambulatory Visit: Payer: Self-pay | Admitting: Surgery

## 2014-11-05 DIAGNOSIS — Z9049 Acquired absence of other specified parts of digestive tract: Secondary | ICD-10-CM | POA: Diagnosis not present

## 2014-11-17 ENCOUNTER — Other Ambulatory Visit: Payer: Self-pay | Admitting: Internal Medicine

## 2014-11-25 DIAGNOSIS — Z85828 Personal history of other malignant neoplasm of skin: Secondary | ICD-10-CM | POA: Diagnosis not present

## 2014-11-25 DIAGNOSIS — L820 Inflamed seborrheic keratosis: Secondary | ICD-10-CM | POA: Diagnosis not present

## 2014-11-25 DIAGNOSIS — D485 Neoplasm of uncertain behavior of skin: Secondary | ICD-10-CM | POA: Diagnosis not present

## 2014-11-25 DIAGNOSIS — D045 Carcinoma in situ of skin of trunk: Secondary | ICD-10-CM | POA: Diagnosis not present

## 2014-12-05 DIAGNOSIS — C44599 Other specified malignant neoplasm of skin of other part of trunk: Secondary | ICD-10-CM | POA: Diagnosis not present

## 2014-12-05 DIAGNOSIS — Z85828 Personal history of other malignant neoplasm of skin: Secondary | ICD-10-CM | POA: Diagnosis not present

## 2014-12-05 DIAGNOSIS — L570 Actinic keratosis: Secondary | ICD-10-CM | POA: Diagnosis not present

## 2014-12-09 LAB — SURGICAL PATHOLOGY

## 2014-12-15 NOTE — Op Note (Signed)
PATIENT NAME:  Jamie Hodges, Jamie Hodges MR#:  [id number] DATE OF BIRTH:  [birth year]  DATE OF PROCEDURE:  10/14/2014  PREOPERATIVE DIAGNOSIS: Choledocholithiasis.   POSTOPERATIVE DIAGNOSIS: Choledocholithiasis, acute gangrenous cholecystitis with hydrops.   PROCEDURE: Laparoscopic cholecystectomy, C-arm fluoroscopic cholangiography, laparoscopic T-tube placement.   SURGEON: Mattew Chriswell E. Burt Knack, MD   ANESTHESIA: General with endotracheal tube.   INDICATIONS: This is a patient with elevated liver function tests, which have improved, suggesting passage of a stone. Preoperatively, we discussed the rationale for surgery, the options of observation, risk of bleeding, infection, recurrence of symptoms, failure to resolve her symptoms, open procedure, bile duct damage, bile duct leak, retained common bile duct stone, any of which could require further surgery and/or ERCP, stent, and papillotomy. This was all reviewed for her. We also discussed potential for an open common duct exploration. She understood and agreed to proceed.   FINDINGS: Acute gangrenous cholecystitis with hydrops, intraoperative avulsion of the cystic duct at the common duct junction precluding occlusive control of the cystic duct necessitating catheter drainage. Cholangiogram catheter placement confirmed presence in the bile duct. Proximal ducts were well identified. The initial cholangiogram through the cholangiogram catheter demonstrated a meniscus sign distally suggesting possible distal common bile duct stone.   DESCRIPTION OF PROCEDURE: The patient was induced with general anesthesia. She was on IV antibiotics. She was prepped and draped in a sterile fashion. Marcaine was infiltrated in the skin and subcutaneous tissues around the periumbilical area. An incision was made. Veress needle was placed. Pneumoperitoneum was obtained. A 5 mm trocar port was placed. The abdominal cavity was explored, and under direct vision, a 10 mm epigastric port and 2  lateral 5 mm ports were placed. The abdominal cavity was fully explored. There were no other identifiable problems with the exception of a dilated and somewhat redundant sigmoid colon in the pelvis. No adhesions were noted.   Attention was turned to the gallbladder where an encasement of omental cast was peeled back bluntly without the use of energy, revealing an acutely inflamed, gangrenous red gallbladder. This was placed on tension, and in so doing, leakage of hydropic bile was noted. It was clear and portions of it had some purulence. This was aspirated.   With the gallbladder on tension, dissection of this thick peel around the infundibulum was performed and during this process the cystic duct-gallbladder junction, which had been well identified, and was being encircled, tore distal to that, distal meaning closest to the common duct. There was no sign of bleeding from the gallbladder at this point.   With avulsion of the cystic duct near the common duct, it was decided to attempt a cholangiogram. Therefore, through an incision and Angiocath, a cholangiogram catheter was placed. It was placed into what appeared to be the cystic duct-gallbladder junction, although at the time it could not be definitively identified as such.  Cholangiography was performed with a single clip which was subsequently removed. There was a meniscal sign distally, the proximal ducts were well identified. The cystic duct was not identified; however, suggesting that the catheter entered right at the cystic duct-common duct juncture where the avulsion had occurred.   With this in mind, the clip was removed, the cholangiogram catheter was removed, and an additional 5 mm port was placed under direct vision in the right upper quadrant. Through this port was placed an 8 French red rubber catheter, which was then threaded into the cystic duct- gallbladder junction and threaded distally. A cholangiogram was performed, showing again that  the cystic duct was not visible, but the proximal ducts and distal ducts were visible.   Attention was returned to the gallbladder itself which was placed on tension and removed from an essential avascular plane using minimal electrocautery. This gangrenous structure basically fell out of the gallbladder fossa, was placed into an Endo Catch bag and taken out through the abdominal epigastric port site. The port site was replaced. The area was irrigated with copious amounts of normal saline being careful not to dislodge the red rubber catheter. This catheter entered the common duct or the cystic duct so close to the common duct that clips could not be placed adjacent to it for fear of stricturing the common bile duct. Therefore, it was threaded into place and left in situ and sutured to the skin of the anterior abdominal wall after removing the additional 5 mm trocar sheath. The red rubber catheter was aspirated, and yellow bile was aspirated without difficulty and cholangiogram was performed through this demonstrating the above and it was then capped temporarily.   Again, hemostasis was found to be adequate. The area was irrigated with copious amounts of normal saline. Hemostasis was adequate and then 2 separate drains were placed, 1 to the foramen of Winslow and gallbladder fossa area and 1 lateral, brought out through the 2 separate 5 mm port sites, tied in with 3-0 nylon. All 3 drains were tied in with 3-0 nylon. Again, hemostasis was adequate. The pneumoperitoneum was released. The epigastric port site was closed with figure-of-eight 0 Vicryls; 4-0 subcuticular Monocryl was used on all skin edges. Steri-Strips and Mastisol were placed. The JP drains were placed to bulb suction and a bile drain bag was attached to the red rubber catheter via a 3-way stopcock and it was draining adequately. The patient tolerated this procedure well. There were no complications other than the avulsion of the cystic duct from the  cystic duct-gallbladder junction as described above, secondary to necrosis and patient disease; see above. Estimated blood loss was approximately 100 mL. The patient tolerated the anesthesia without difficulty and was taken to the recovery room in stable condition to be admitted for continued care.    ____________________________ Jerrol Banana. Burt Knack, MD rec:LT D: 10/14/2014 13:30:59 ET T: 10/14/2014 18:57:34 ET JOB#: [id number]  cc: Jerrol Banana. Burt Knack, MD, <Dictator> Florene Glen MD ELECTRONICALLY SIGNED 10/15/2014 7:41

## 2014-12-15 NOTE — H&P (Signed)
History of Present Illness 4 yowf who experienced epigastric pain and anorexia 5 nights ago (on Tuesday). The following day she was better, and tehn the pain returned on Thursday, and was associated with anorexia, nausea, and dry heaves. Friday she was again improved and yesterday her nausea returned, but was succesfully treated with medication. SHe denies fever, chills, any change in the color of her skin, eyes, urine, or stool. Last BM was 3-4 days ago and normal.   I personally reviewed the CT scan images independent of the radiologist.   Past History additional ROS- full 10 system review is negative except for the pertinent GI system positives and negatives in the HPI.   Code Status Full Code   Past Med/Surgical Hx:  HTN:   ALLERGIES:  Clindamycin: Rash, Itching  Penicillin: Unknown  Codeine: Unknown  HOME MEDICATIONS: Medication Instructions Status  Aspirin Low Dose 81 mg oral delayed release tablet 1 tab(s) orally once a day Active  ramipril 10 mg oral capsule 1 cap(s) orally once a day Active  metoprolol 50 mg oral tablet, extended release 2 tab(s) orally 2 times a day Active   Family and Social History:  Family History mom died in her 89s of emphysema; dad died in his 78s of heart disease   Social History married, lives in independent section of [place] with her husband, who has dementia. 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   + Tobacco Prior (greater than 1 year)   Place of Living Home   Review of Systems:  Fever/Chills No   Cough No   Sputum No   Abdominal Pain Yes   Diarrhea No   Constipation No   Nausea/Vomiting Yes   SOB/DOE No   Chest Pain No   Dysuria No   Tolerating PT Yes   Tolerating Diet No  Nauseated  Vomiting  anorexia   Medications/Allergies Reviewed Medications/Allergies reviewed   Physical Exam:  GEN well developed, well nourished, no acute distress, sitting on side of ED stretcher    HEENT pink conjunctivae, PERRL, hearing intact to voice, moist oral mucosa, Oropharynx clear   NECK supple  trachea midline   RESP normal resp effort  clear BS  no use of accessory muscles   CARD regular rate  no murmur  no JVD  no Rub   ABD positive tenderness  normal BS   EXTR negative cyanosis/clubbing, negative edema   SKIN normal to palpation, skin turgor good   NEURO cranial nerves intact, negative tremor, follows commands, motor/sensory function intact   PSYCH alert, A+O to time, place, person, good insight   Lab Results: Hepatic:  28-Feb-16 08:18   Bilirubin, Total  3.7  Alkaline Phosphatase 87  SGPT (ALT) 31  SGOT (AST) 31  Total Protein, Serum 7.3  Albumin, Serum 3.5  Routine Chem:  28-Feb-16 08:18   Glucose, Serum  149  BUN 18  Creatinine (comp) 1.21  Sodium, Serum  134  Potassium, Serum 4.6  Chloride, Serum 98  CO2, Serum 27  Calcium (Total), Serum 9.4  Osmolality (calc) 273  eGFR (African American)  55  eGFR (Non-African American)  45 (eGFR values <23m/min/1.73 m2 may be an indication of chronic kidney disease (CKD). Calculated eGFR, using the MRDR Study equation, is useful in  patients with stable renal function. The eGFR calculation will not be reliable in acutely ill patients when serum creatinine is changing rapidly. It is not useful in patients on  dialysis. The eGFR calculation may not be applicable to patients at the low and high extremes of body sizes, pregnant women, and vegetarians.)  Anion Gap 9  Lipase 86 (Result(s) reported on 13 Oct 2014 at 08:41AM.)    09:40   Result Comment - results called to jennfer, RN at 05/14/15  - at 0945 by gkm  - Box.  - Sample ran twice  Result(s) reported on 13 Oct 2014 at 09:49AM.  Routine Hem:  28-Feb-16 08:18   WBC (CBC)  22.1  RBC (CBC) 4.15  Hemoglobin (CBC) 13.3  Hematocrit (CBC) 40.1  Platelet Count (CBC) 246  MCV 97  MCH 32.2  MCHC 33.3  RDW 13.2  Neutrophil %  90.5  Lymphocyte % 3.8  Monocyte % 5.2  Eosinophil % 0.0  Basophil % 0.5  Neutrophil #  20.0  Lymphocyte #  0.8  Monocyte #  1.1  Eosinophil # 0.0  Basophil # 0.1 (Result(s) reported on 13 Oct 2014 at 08:29AM.)   Radiology Results: LabUnknown:    28-Feb-16 10:32, CT Abdomen and Pelvis With Contrast  PACS Image  CT:  CT Abdomen and Pelvis With Contrast  REASON FOR EXAM:    (1) RUQ + LLQ pain worsening x 4 days, tender.   tachycardia, wbc 22; (2) RUQ + LL  COMMENTS:   May transport without cardiac monitor    PROCEDURE: CT  - CT ABDOMEN / PELVIS  W  - Oct 13 2014 10:32AM     CLINICAL DATA:  Gastritis,nausea/vomiting, abdominopelvic pain    EXAM:  CT ABDOMEN AND PELVIS WITH CONTRAST    TECHNIQUE:  Multidetector CT imaging of the abdomen and pelvis was performed  using the standard protocol following bolus administration of  intravenous contrast.  CONTRAST:  80 mL Omnipaque 300 IV    COMPARISON:  None.    FINDINGS:  Lower chest:  Lung bases are clear.    Hepatobiliary: Liver is within normal limits.    Layering gallbladder sludge and/or noncalcified gallstones (series  2/ image 38). Surroundingpericholecystic inflammatory changes on CT  (Series 2/ image 36). This appearance is suspicious for acute  cholecystitis.    Pancreas: Within normal limits.  Spleen: Within normal limits.    Adrenals/Urinary Tract: Adrenal glands are within normal limits.    Bilateral kidneys are within normal limits.  No hydronephrosis.    Bladder is within normal limits.    Stomach/Bowel: Stomach is notable for a tiny hiatal hernia.    No evidence of bowel obstruction.    Secondary inflammatory changes involving a loop of small bowel in  the right upper abdomen (series 2/image 40).  Extensive sigmoid diverticulosis, without evidence of  diverticulitis.    Vascular/Lymphatic: Atherosclerotic calcifications of the abdominal  aorta and branch vessels.    No suspicious abdominopelvic  lymphadenopathy.    Reproductive: Uterus is unremarkable.    Bilateral ovaries are within normal limits.    Other: Small volume pelvic ascites.    Musculoskeletal: Degenerative changes of the visualized  thoracolumbar spine.     IMPRESSION:  Suspected acute cholecystitis.      Electronically Signed    By: Julian Hy M.D.    On: 10/13/2014 10:41         Verified By: Julian Hy, M.D.,    Assessment/Admission Diagnosis Acute calculous cholecystitis, dx-ed by CT, with  Bilirubin 3.7 HTN   Plan Admit, IVF, IV ABx (Clindamycin and Flagyl due to PCN and Clinda allergy). U/S to assess CBD  diameter. Repeat CBC, CMP in AM. Possible lap CCY tomorrow; that will be determined by Dr Burt Knack, who will assume her care in tomorrow. [I spent 75 minutes coordinating her care, educating, and speaking to her son, Caci Orren, MD, who is an OB-GYN in [place]; over 40 minutes was direct face-to-face time]. She has expressed interest in going home to arrange care for her husband. If she chooses to leave the ED she knows to return for direct admission to the surgical floor later this afternoon. 1st ABx doses ordered by ED MD, who I also discussed the case with in person.   Electronic Signatures: Consuela Mimes (MD)  (Signed [phone] 12:06)  Authored: CHIEF COMPLAINT and HISTORY, PAST MEDICAL/SURGIAL HISTORY, ALLERGIES, HOME MEDICATIONS, FAMILY AND SOCIAL HISTORY, REVIEW OF SYSTEMS, PHYSICAL EXAM, LABS, Radiology, ASSESSMENT AND PLAN   Last Updated: 28-Feb-16 12:06 by Consuela Mimes (MD)

## 2014-12-15 NOTE — Discharge Summary (Signed)
PATIENT NAME:  Jamie Hodges, Jamie Hodges MR#:  [id number] DATE OF BIRTH:  1928/07/08  DATE OF ADMISSION:  10/13/2014 DATE OF DISCHARGE:  10/20/2014  DIAGNOSES: Acute cholecystitis, choledocholithiasis, retained common bile duct stone, reflux disease .  CONSULTANT: Lucilla Lame, MD, GI.  PROCEDURES: 1. Laparoscopic cholecystectomy, C-arm fluoroscopic cholangiography, laparoscopic T-tube placement.  2. ERCP with stone extraction and stent placement with papillotomy.  HISTORY OF PRESENT ILLNESS AND HOSPITAL COURSE: This is a patient who was admitted to the hospital with a diagnosis of choledocholithiasis. She was taken to the operating room where laparoscopic cholecystectomy was performed. It was noted that she had a gangrenous gallbladder and the cystic duct tore away from the gallbladder itself right at the common bile duct junction. A cholangiogram was performed, which confirmed this finding and that there was likely a retained common bile duct stone. A red rubber catheter, 8-French, was placed into the cystic duct/gallbladder opening, and drains were placed.  Postoperatively, her liver function tests returned to normal because the bile was draining out of the red rubber catheter that was in the common bile duct. She had no bile in her drain, but a cholangiogram was performed on postoperative day 3, which demonstrated minimal if any flow into the duodenum and a retained common bile duct stone.   Dr. Allen Norris of GI was consulted. He saw the patient and determined that an ERCP would be helpful. He performed that ERCP removing a stone and placing a stent with a papillotomy. Post ERCP, she did well. There was no bile in her drains. The drains were removed on sequential days and her red rubber catheter was capped with no further leak or drainage. Therefore, she is discharged in stable condition on oral analgesics and her preoperative medications, to follow up in my office in 4 to 5 days, and she has been given instructions  concerning care of the capped off red rubber catheter, showering, etc. The red rubber catheter will stay for several weeks. She has a stent in place. Should it become dislodged, it would not be a major problem because there is less likely to have a leak with the stent in place. She will follow up with Dr. Allen Norris as well in 2 weeks.    ____________________________ Jerrol Banana Burt Knack, MD rec:jh D: 10/20/2014 10:40:35 ET T: 10/20/2014 13:02:59 ET JOB#: [id number]  cc: Jerrol Banana. Burt Knack, MD, <Dictator> Florene Glen MD ELECTRONICALLY SIGNED 10/20/2014 16:57

## 2014-12-15 NOTE — Consult Note (Signed)
PATIENT NAME:  Jamie Hodges, Jamie Hodges MR#:  [id number] DATE OF BIRTH:  [birth year]  DATE OF CONSULTATION:  10/17/2014 CONSULTING PHYSICIAN:  Andria Meuse, NP  PRIMARY CARE PHYSICIAN: Dr. [name].   REQUESTING PHYSICIAN: Dr. Burt Knack.   CONSULTING GASTROENTEROLOGIST:  Dr Lucilla Lame.   REASON FOR CONSULTATION:  Choledocholithiasis.  HISTORY OF PRESENT ILLNESS: Jamie Hodges is an 81 year old female who was in her usual state of health until she developed severe epigastric pain prior to admission with anorexia, nausea and dry heaves. She was found to have acute gangrenous cholecystitis with hydrops and underwent a laparoscopic cholecystectomy, cholangiography, and T-tube placement by Dr. Burt Knack 10/14/2014. She had intraoperative avulsion of the cystic duct at the common duct junction. She has done very well postoperatively. Her liver function tests have continued to decline. Her current LFTs are normal as she came in with a bilirubin of 3.7. Her white blood cell count was 22.1 on admission, down to 8.4 today. Her hemoglobin is 11.6. Today, she tells me she has some pain at the incision site, but only with movement. She denies any nausea, vomiting, fever or chills. She denies any jaundice. She tells me she has been eating her meals well.   She had a postoperative cholangiogram 10/17/2014 that showed a partial obstruction of the distal common bile duct at the level of the ampulla. This could be from debris and/or small stones. Intraoperative cholangiogram demonstrates a partial opacification of the intrahepatic ducts at the liver hilum and the extrahepatic ducts.  Vague irregular filling defect at the confluence of the common hepatic duct and the right side posterior hepatic duct may represent debris, soft tissue, sludge or stones.  October 13 2014, ultrasound showed gallbladder wall thickening, pericholecystic fluid or gallbladder distention. Multiple stones and sludge are consistent with acute  cholecystitis. Common bile duct was normal caliber and liver was normal. CT scan of the abdomen and pelvis with contrast 10/13/2014, shows layering gallbladder sludge and/or noncalcified gallstones surrounding pericholecystic inflammatory changes on CT suspicious for acute cholecystitis.   PAST MEDICAL AND SURGICAL HISTORY: Hypertension, gangrenous cholecystitis status post laparoscopic cholecystectomy, and tonsillectomy.   MEDICATIONS PRIOR TO ADMISSION: Aspirin 81 mg daily, Ramipril 10 mg daily, metoprolol 50 mg extended release 2 tablets b.i.d.   ALLERGIES: CLINDAMYCIN CAUSES RASH.  PENICILLIN UNKNOWN, CODEINE UNKNOWN.   FAMILY HISTORY: There is no known family history of colon carcinoma, liver or chronic GI problems.   SOCIAL HISTORY: She is married. She resides at [facility]. She is a retired Lexicographer, [employer]. She had 3 sons. She lost one due to glioblastoma. She has 2 healthy sons. She denies any tobacco or illicit drug use. She drinks a couple of glasses of wine per week.   REVIEW OF SYSTEMS:  See HPI, otherwise negative complete review of systems.   PHYSICAL EXAMINATION:  BMI 25.1, height is 66.9 inches. Weight is 160.3 pounds, temperature is 97.9, pulse 98.7, respirations 18, blood pressure 160/77, oxygen saturation 96% on room air.  GENERAL: She is a well-developed, well-nourished elderly female in no acute distress.  HEENT: Sclerae clear, nonicteric. Conjunctivae pink. Oropharynx pink and moist without any lesions.  NECK: Supple without mass or thyromegaly.  CHEST: Heart regular rate and rhythm. Normal S1, S2. No murmurs, clicks, rubs, or gallops.  LUNGS: Clear to auscultation bilaterally.  ABDOMEN: She has a dressing intact. She has 2 drains intact with serosanguineous drainage. She has a drain with bilious material from T-tube placement. She has positive bowel sounds x4.  ABDOMEN: Soft, nondistended and nontender.  EXTREMITIES: Without clubbing or edema  bilaterally.  SKIN: Pink, warm and dry without any rash or jaundice.  NEUROLOGIC: Grossly intact.  MUSCULOSKELETAL: Good equal movement and strength bilaterally. PSYCHIATRY:   She is alert and oriented x3, cooperative, normal mood and affect.   LABORATORY STUDIES: See HPI. INR is 1.1, potassium 3.4, otherwise normal basic metabolic panel. Her lipase was 62, on February 29. Total protein 5.7, and albumin 2.3.   IMPRESSION: Jamie Hodges is a very pleasant 81 year old female with suspected choledocholithiasis on postoperative cholangiogram. She had acute gangrenous cholecystitis with hydrops and had an intraoperative avulsion of the cystic duct at the common duct junction and a T-tube was placed during laparoscopic cholecystectomy. She had a postoperative cholangiogram through the T-tube which showed likely stones or sludge/debris in the distal bile duct. She will have endoscopic retrograde cholangiopancreatography with Dr. Allen Norris for stone sludge extraction tomorrow.  I  discussed the risks and benefits of the procedure which include but are not limited to pancreatitis, bleeding, infection, perforation, and drug reaction. She agrees with this plan and consent will be obtained. I have discussed her care with Dr. Lucilla Lame.   PLAN:  1.  She is n.p.o. after midnight.  2.  Indomethacin 100 mg per rectum, on call to OR.  3.  Endoscopic retrograde cholangiopancreatography with Dr. Allen Norris tomorrow with possible stone extraction, sphincterotomy, stent placement if needed,  4.  Continue supportive measures.  Thank you for allowing [place] to participate in her care.    ____________________________ Andria Meuse, NP klj:at D: 10/17/2014 21:25:50 ET T: 10/17/2014 21:57:28 ET JOB#: [id number]  cc: Andria Meuse, NP, <Dictator> [name], MD  Andria Meuse FNP ELECTRONICALLY SIGNED 11/01/2014 12:43

## 2014-12-15 NOTE — Consult Note (Signed)
Brief Consult Note: Diagnosis: Choledocholithiasis.   Patient was seen by consultant.   Comments: Jamie Hodges is a very pleasant 81 y/o female with choledocholithiasis on cholangiogram.  She will have ERCP with Dr Allen Norris for stone extraction tomorrow.  Discussed risks/benefits of procedure which include but are not limited to pancreatitis, bleeding, infection, perforation & drug reaction.  Patient agrees with this plan & consent will be obtained.  Thanks for allowing [place] to participate in her care.  Please see full dictated note. #[id number].  Electronic Signatures: Andria Meuse (NP)  (Signed 03-Mar-16 21:26)  Authored: Brief Consult Note   Last Updated: 03-Mar-16 21:26 by Andria Meuse (NP)

## 2015-01-14 ENCOUNTER — Ambulatory Visit
Admission: RE | Admit: 2015-01-14 | Discharge: 2015-01-14 | Disposition: A | Discharge status: Home / Self Care | Payer: Medicare Other | Source: Ambulatory Visit | Attending: Gastroenterology | Admitting: Gastroenterology

## 2015-01-14 ENCOUNTER — Encounter
Admission: RE | Disposition: A | Discharge status: Home / Self Care | Payer: Self-pay | Source: Ambulatory Visit | Attending: Gastroenterology

## 2015-01-14 ENCOUNTER — Ambulatory Visit: Payer: Medicare Other | Admitting: Anesthesiology

## 2015-01-14 DIAGNOSIS — Z86010 Personal history of colonic polyps: Secondary | ICD-10-CM | POA: Insufficient documentation

## 2015-01-14 DIAGNOSIS — Z79899 Other long term (current) drug therapy: Secondary | ICD-10-CM | POA: Diagnosis not present

## 2015-01-14 DIAGNOSIS — T85590A Other mechanical complication of bile duct prosthesis, initial encounter: Secondary | ICD-10-CM | POA: Diagnosis not present

## 2015-01-14 DIAGNOSIS — Z880 Allergy status to penicillin: Secondary | ICD-10-CM | POA: Diagnosis not present

## 2015-01-14 DIAGNOSIS — J449 Chronic obstructive pulmonary disease, unspecified: Secondary | ICD-10-CM | POA: Diagnosis not present

## 2015-01-14 DIAGNOSIS — I2510 Atherosclerotic heart disease of native coronary artery without angina pectoris: Secondary | ICD-10-CM | POA: Diagnosis not present

## 2015-01-14 DIAGNOSIS — E785 Hyperlipidemia, unspecified: Secondary | ICD-10-CM | POA: Insufficient documentation

## 2015-01-14 DIAGNOSIS — Z4589 Encounter for adjustment and management of other implanted devices: Secondary | ICD-10-CM | POA: Diagnosis present

## 2015-01-14 DIAGNOSIS — Z87891 Personal history of nicotine dependence: Secondary | ICD-10-CM | POA: Insufficient documentation

## 2015-01-14 DIAGNOSIS — Z4689 Encounter for fitting and adjustment of other specified devices: Secondary | ICD-10-CM | POA: Diagnosis not present

## 2015-01-14 DIAGNOSIS — Z9689 Presence of other specified functional implants: Secondary | ICD-10-CM | POA: Diagnosis not present

## 2015-01-14 DIAGNOSIS — I10 Essential (primary) hypertension: Secondary | ICD-10-CM | POA: Diagnosis not present

## 2015-01-14 DIAGNOSIS — Z885 Allergy status to narcotic agent status: Secondary | ICD-10-CM | POA: Diagnosis not present

## 2015-01-14 DIAGNOSIS — Z888 Allergy status to other drugs, medicaments and biological substances status: Secondary | ICD-10-CM | POA: Insufficient documentation

## 2015-01-14 DIAGNOSIS — Z4659 Encounter for fitting and adjustment of other gastrointestinal appliance and device: Secondary | ICD-10-CM | POA: Diagnosis not present

## 2015-01-14 DIAGNOSIS — Z7982 Long term (current) use of aspirin: Secondary | ICD-10-CM | POA: Insufficient documentation

## 2015-01-14 HISTORY — PX: ERCP: SHX5425

## 2015-01-14 SURGERY — ERCP, WITH INTERVENTION IF INDICATED
Anesthesia: General

## 2015-01-14 MED ORDER — MIDAZOLAM HCL 2 MG/2ML IJ SOLN
INTRAMUSCULAR | Status: DC | PRN
  Administered 2015-01-14: 1 mg via INTRAVENOUS

## 2015-01-14 MED ORDER — FENTANYL CITRATE (PF) 100 MCG/2ML IJ SOLN
INTRAMUSCULAR | Status: DC | PRN
  Administered 2015-01-14: 50 ug via INTRAVENOUS

## 2015-01-14 MED ORDER — PROPOFOL INFUSION 10 MG/ML OPTIME
INTRAVENOUS | Status: DC | PRN
  Administered 2015-01-14: 120 ug/kg/min via INTRAVENOUS

## 2015-01-14 MED ORDER — MIDAZOLAM HCL 2 MG/2ML IJ SOLN: INTRAMUSCULAR | Status: DC | PRN

## 2015-01-14 MED ORDER — SODIUM CHLORIDE 0.9 % IV SOLN
INTRAVENOUS | Status: DC
  Administered 2015-01-14: 1000 mL via INTRAVENOUS

## 2015-01-14 NOTE — Op Note (Signed)
[facility] Gastroenterology Patient Name: Jamie Hodges Procedure Date: 01/14/2015 11:04 AM MRN: [id number] Account #: [id number] Date of Birth: 09-Jul-1928 Admit Type: Outpatient Age: 81 Room: [facility] ENDO ROOM 4 Gender: Female Note Status: Finalized Procedure:         ERCP Indications:       Stent removal Providers:         Lucilla Lame, MD Referring MD:      [name], MD (Referring MD) Medicines:         Propofol per Anesthesia Complications:     No immediate complications. Procedure:         Pre-Anesthesia Assessment:                    - Prior to the procedure, a History and Physical was                     performed, and patient medications and allergies were                     reviewed. The patient's tolerance of previous anesthesia                     was also reviewed. The risks and benefits of the procedure                     and the sedation options and risks were discussed with the                     patient. All questions were answered, and informed consent                     was obtained. Prior Anticoagulants: The patient has taken                     no previous anticoagulant or antiplatelet agents. ASA                     Grade Assessment: II - A patient with mild systemic                     disease. After reviewing the risks and benefits, the                     patient was deemed in satisfactory condition to undergo                     the procedure.                    After obtaining informed consent, the scope was passed                     under direct vision. Throughout the procedure, the                     patient's blood pressure, pulse, and oxygen saturations                     were monitored continuously. The ERCP was accomplished                     without difficulty. The patient tolerated the procedure  well. The Olympus TJF-160VF duodenoscope (S#. [id number]) was                     introduced through the  mouth, and used to inject contrast                     into and used to inject contrast into the bile duct. Findings:      A biliary stent was visible on the scout film. One plastic stent       originating in the biliary tree was emerging from the major papilla. One       stent was removed from the biliary tree using a snare. The biliary tree       was swept with a 15 mm balloon starting at the bifurcation. Sludge was       swept from the duct. Impression:        - One stent from the biliary tree was seen in the major                     papilla.                    - One stent was removed from the biliary tree.                    - The biliary tree was swept and sludge was found.                    - No specimens collected. Recommendation:    - Watch for pancreatitis, bleeding, perforation, and                     cholangitis. Procedure Code(s): --- Professional ---                    [phone], Endoscopic retrograde cholangiopancreatography                     (ERCP); with removal of foreign body(s) or stent(s) from                     biliary/pancreatic duct(s)                    [id number], Endoscopic retrograde cholangiopancreatography                     (ERCP); with removal of calculi/debris from                     biliary/pancreatic duct(s) Diagnosis Code(s): --- Professional ---                    Z96.89, Presence of other specified functional implants                    Z46.59, Encounter for fitting and adjustment of other                     gastrointestinal appliance and device CPT copyright 2014 American Medical Association. All rights reserved. The codes documented in this report are preliminary and upon coder review may  be revised to meet current compliance requirements. Lucilla Lame, MD 01/14/2015 11:18:16 AM This report has been signed electronically. Number of Addenda: 0 Note Initiated On: 01/14/2015 11:04 AM      [facility]

## 2015-01-14 NOTE — Anesthesia Postprocedure Evaluation (Signed)
  Anesthesia Post-op Note  Patient: Jamie Hodges  Procedure(s) Performed: Procedure(s): ENDOSCOPIC RETROGRADE CHOLANGIOPANCREATOGRAPHY (ERCP) (N/A)  Anesthesia type:General  Patient location: PACU  Post pain: Pain level controlled  Post assessment: Post-op Vital signs reviewed, Patient's Cardiovascular Status Stable, Respiratory Function Stable, Patent Airway and No signs of Nausea or vomiting  Post vital signs: Reviewed and stable  Last Vitals:  Filed Vitals:   01/14/15 0954  BP: 186/74  Pulse: 80  Temp: 36.5 C  Resp: 16    Level of consciousness: awake, alert  and patient cooperative  Complications: No apparent anesthesia complications

## 2015-01-14 NOTE — Anesthesia Preprocedure Evaluation (Signed)
Anesthesia Evaluation  Patient identified by MRN, date of birth, ID band Patient awake    Reviewed: Allergy & Precautions, NPO status , Patient's Chart, lab work & pertinent test results, reviewed documented beta blocker date and time   Airway Mallampati: II  TM Distance: >3 FB     Dental  (+) Chipped   Pulmonary COPDformer smoker,          Cardiovascular hypertension, + CAD     Neuro/Psych    GI/Hepatic   Endo/Other    Renal/GU      Musculoskeletal   Abdominal   Peds  Hematology   Anesthesia Other Findings   Reproductive/Obstetrics                             Anesthesia Physical Anesthesia Plan  ASA: III  Anesthesia Plan: General   Post-op Pain Management:    Induction: Intravenous  Airway Management Planned: Nasal Cannula  Additional Equipment:   Intra-op Plan:   Post-operative Plan:   Informed Consent:   Plan Discussed with: CRNA  Anesthesia Plan Comments: (Hearing aids.)        Anesthesia Quick Evaluation

## 2015-01-14 NOTE — Anesthesia Procedure Notes (Signed)
Performed by: COOK-MARTIN, Media Pizzini Pre-anesthesia Checklist: Patient identified, Emergency Drugs available, Suction available, Patient being monitored and Timeout performed Patient Re-evaluated:Patient Re-evaluated prior to inductionOxygen Delivery Method: Nasal cannula Preoxygenation: Pre-oxygenation with 100% oxygen Intubation Type: IV induction Airway Equipment and Method: Bite block     

## 2015-01-14 NOTE — Transfer of Care (Signed)
Immediate Anesthesia Transfer of Care Note  Patient: Jamie Hodges  Procedure(s) Performed: Procedure(s): ENDOSCOPIC RETROGRADE CHOLANGIOPANCREATOGRAPHY (ERCP) (N/A)  Patient Location: PACU  Anesthesia Type:General  Level of Consciousness: awake, alert  and oriented  Airway & Oxygen Therapy: Patient Spontanous Breathing and Patient connected to nasal cannula oxygen  Post-op Assessment: Report given to RN and Post -op Vital signs reviewed and stable  Post vital signs: Reviewed and stable  Last Vitals:  Filed Vitals:   01/14/15 0954  BP: 186/74  Pulse: 80  Temp: 36.5 C  Resp: 16    Complications: No apparent anesthesia complications

## 2015-01-14 NOTE — H&P (Signed)
[facility]  [address] [place], [place] [address] Phone: [phone] Fax : [phone]  Primary Care Physician:  Viviana Simpler, MD Primary Gastroenterologist:  Dr. Allen Norris  Pre-Procedure History & Physical: HPI:  Jamie Hodges is a 81 y.o. female is here for an ERCP.   Past Medical History  Diagnosis Date  . Atrial fibrillation 2010    One time during hospital while sick with severe diarrhea  . Hx of colonic polyps   . Hyperlipidemia   . Hypertension   . CAD (coronary artery disease) 2010    minor blockages --no intervention indicated  . C. difficile colitis     3/10  Severe C. dif ---had brief atrial fib then. Cath shows some blockages but no intervention    Past Surgical History  Procedure Laterality Date  . Tonsillectomy and adenoidectomy      Prior to Admission medications   Medication Sig Start Date End Date Taking? Authorizing Provider  aspirin 81 MG tablet Take 81 mg by mouth daily.     Yes Historical Provider, MD  Loperamide HCl (IMODIUM A-D PO) Take 1 tablet by mouth as needed.    Yes Historical Provider, MD  meclizine (ANTIVERT) 25 MG tablet TAKE ONE TABLET BY MOUTH THREE TIMES DAILY AS NEEDED FOR DIZZINESS OR NAUSEA 11/18/14  Yes [name], MD  metoprolol succinate (TOPROL-XL) 100 MG 24 hr tablet Take 1 tablet (100 mg total) by mouth daily. Take with or immediately following a meal. 02/27/14  Yes [name], MD  ramipril (ALTACE) 10 MG capsule TAKE 1 CAPSULE DAILY 05/27/14  Yes [name], MD    Allergies as of 12/31/2014 - Review Complete 02/27/2014  Allergen Reaction Noted  . Codeine sulfate  09/16/2009  . Penicillins  09/16/2009  . Crestor [rosuvastatin calcium]  07/15/2011    Family History  Problem Relation Age of Onset  . COPD Mother   . Coronary artery disease Neg Hx   . Diabetes Neg Hx   . Cancer Neg Hx     breast or colon cancer    History   Social History  . Marital Status: Married    Spouse Name:  N/A  . Number of Children: 3  . Years of Education: N/A   Occupational History  . retired 1st grade teacher    Social History Main Topics  . Smoking status: Former Smoker    Quit date: 08/16/1978  . Smokeless tobacco: Never Used  . Alcohol Use: Yes     Comment: white wine  . Drug Use: No  . Sexual Activity: Not on file   Other Topics Concern  . Not on file   Social History Narrative   Has living will.    Husband then son Mikki Santee (MD) to make health care decisions.   Has DNR order in past and requests again--done   No tube feeds if cognitively unaware             Review of Systems: See HPI, otherwise negative ROS  Physical Exam: BP 186/74 mmHg  Pulse 80  Temp(Src) 97.7 F (36.5 C) (Tympanic)  Resp 16  Ht 5' 7.5" (1.715 m)  Wt 154 lb (69.854 kg)  BMI 23.75 kg/m2  SpO2 100% General:   Alert,  pleasant and cooperative in NAD Head:  Normocephalic and atraumatic. Neck:  Supple; no masses or thyromegaly. Lungs:  Clear throughout to auscultation.    Heart:  Regular rate and rhythm. Abdomen:  Soft, nontender and nondistended. Normal  bowel sounds, without guarding, and without rebound.   Neurologic:  Alert and  oriented x4;  grossly normal neurologically.  Impression/Plan: Jamie Hodges is here for an ERCP to be performed for stent reoval  Risks, benefits, limitations, and alternatives regarding  ERCP have been reviewed with the patient.  Questions have been answered.  All parties agreeable.   Ollen Bowl, MD  01/14/2015, 9:58 AM

## 2015-01-16 ENCOUNTER — Encounter: Payer: Self-pay | Admitting: Gastroenterology

## 2015-01-19 ENCOUNTER — Other Ambulatory Visit: Payer: Self-pay | Admitting: Internal Medicine

## 2015-03-05 ENCOUNTER — Ambulatory Visit (INDEPENDENT_AMBULATORY_CARE_PROVIDER_SITE_OTHER): Payer: Medicare Other | Admitting: Internal Medicine

## 2015-03-05 ENCOUNTER — Encounter: Payer: Self-pay | Admitting: Internal Medicine

## 2015-03-05 VITALS — BP 148/70 | HR 68 | Temp 98.0°F | Ht 68.0 in | Wt 157.0 lb

## 2015-03-05 DIAGNOSIS — I10 Essential (primary) hypertension: Secondary | ICD-10-CM | POA: Diagnosis not present

## 2015-03-05 DIAGNOSIS — E785 Hyperlipidemia, unspecified: Secondary | ICD-10-CM | POA: Diagnosis not present

## 2015-03-05 DIAGNOSIS — H8110 Benign paroxysmal vertigo, unspecified ear: Secondary | ICD-10-CM | POA: Diagnosis not present

## 2015-03-05 DIAGNOSIS — Z23 Encounter for immunization: Secondary | ICD-10-CM | POA: Diagnosis not present

## 2015-03-05 DIAGNOSIS — K529 Noninfective gastroenteritis and colitis, unspecified: Secondary | ICD-10-CM

## 2015-03-05 DIAGNOSIS — Z7189 Other specified counseling: Secondary | ICD-10-CM

## 2015-03-05 NOTE — Addendum Note (Signed)
Addended by: Despina Hidden on: 03/05/2015 12:55 PM   Modules accepted: Orders

## 2015-03-05 NOTE — Progress Notes (Signed)
Subjective:    Patient ID: Jamie Hodges, female    DOB: [birth year], 81 y.o.   MRN: [id number]  HPI Here for follow up of chronic medical conditions  Had lap chole in March Then ERCP in May Everything is resolved with this  She would like to get off her BP meds But no problems No dizziness or syncope No chest pain No SOB No edema  Husband has been slipping Some stress with his dementia Not much help with instrumental ADLs but does his own  Still gets intermittent diarrhea Uses the imodium 2 tabs most days No constipation on this No blood in stool  She does not want Rx for cholesterol  Current Outpatient Prescriptions on File Prior to Visit  Medication Sig Dispense Refill  . aspirin 81 MG tablet Take 81 mg by mouth daily.      . Loperamide HCl (IMODIUM A-D PO) Take 1 tablet by mouth as needed.     . meclizine (ANTIVERT) 25 MG tablet TAKE ONE TABLET BY MOUTH THREE TIMES DAILY AS NEEDED FOR DIZZINESS OR NAUSEA 60 tablet 0  . metoprolol succinate (TOPROL-XL) 100 MG 24 hr tablet Take 1 tablet (100 mg total) by mouth daily. Take with or immediately following a meal. 90 tablet 3  . ramipril (ALTACE) 10 MG capsule TAKE 1 CAPSULE DAILY 90 capsule 3   No current facility-administered medications on file prior to visit.    Allergies  Allergen Reactions  . Codeine Sulfate     REACTION: rash/ welps  . Penicillins     REACTION: rash and welps  . Crestor [Rosuvastatin Calcium]     myalgia    Past Medical History  Diagnosis Date  . Atrial fibrillation 2010    One time during hospital while sick with severe diarrhea  . Hx of colonic polyps   . Hyperlipidemia   . Hypertension   . CAD (coronary artery disease) 2010    minor blockages --no intervention indicated  . C. difficile colitis     3/10  Severe C. dif ---had brief atrial fib then. Cath shows some blockages but no intervention    Past Surgical History  Procedure Laterality Date  . Tonsillectomy and adenoidectomy      . Ercp N/A 01/14/2015    Procedure: ENDOSCOPIC RETROGRADE CHOLANGIOPANCREATOGRAPHY (ERCP);  Surgeon: Lucilla Lame, MD;  Location: [facility] ENDOSCOPY;  Service: Endoscopy;  Laterality: N/A;  . Cholecystectomy  3/16    Family History  Problem Relation Age of Onset  . COPD Mother   . Coronary artery disease Neg Hx   . Diabetes Neg Hx   . Cancer Neg Hx     breast or colon cancer    History   Social History  . Marital Status: Married    Spouse Name: N/A  . Number of Children: 3  . Years of Education: N/A   Occupational History  . retired 1st grade teacher    Social History Main Topics  . Smoking status: Former Smoker    Quit date: 08/16/1978  . Smokeless tobacco: Never Used  . Alcohol Use: Yes     Comment: white wine  . Drug Use: No  . Sexual Activity: Not on file   Other Topics Concern  . Not on file   Social History Narrative   Has living will.    Husband then son Jamie Hodges (MD) to make health care decisions.   Has DNR order in past and requests again--done   No tube feeds if cognitively unaware  Review of Systems Rare vertigo Occasional ringing and some hearing loss Appetite is good Weight stable No falls  No depression or anhedonia    Objective:   Physical Exam  Constitutional: She appears well-developed and well-nourished. No distress.  Neck: Normal range of motion. Neck supple. No thyromegaly present.  Cardiovascular: Normal rate, regular rhythm and normal heart sounds.  Exam reveals no gallop.   No murmur heard. Pulmonary/Chest: Effort normal and breath sounds normal. No respiratory distress. She has no wheezes. She has no rales.  Abdominal: Soft. There is no tenderness.  Musculoskeletal: She exhibits no edema or tenderness.  Lymphadenopathy:    She has no cervical adenopathy.  Psychiatric: She has a normal mood and affect. Her behavior is normal.          Assessment & Plan:

## 2015-03-05 NOTE — Assessment & Plan Note (Signed)
BP Readings from Last 3 Encounters:  03/05/15 148/70  01/14/15 143/82  02/27/14 138/60   Reasonable control Urged her not to stop meds---discussed recent studies

## 2015-03-05 NOTE — Assessment & Plan Note (Signed)
Uses meclizine prn

## 2015-03-05 NOTE — Assessment & Plan Note (Signed)
Does well with the imodium No signs of disease process

## 2015-03-05 NOTE — Progress Notes (Signed)
Pre visit review using our clinic review tool, if applicable. No additional management support is needed unless otherwise documented below in the visit note. 

## 2015-03-05 NOTE — Assessment & Plan Note (Signed)
She adamantly does not want meds for this Will not check levels

## 2015-03-05 NOTE — Assessment & Plan Note (Signed)
Has DNR 

## 2015-03-14 ENCOUNTER — Other Ambulatory Visit: Payer: Self-pay | Admitting: Internal Medicine

## 2015-04-17 ENCOUNTER — Telehealth: Payer: Self-pay | Admitting: Internal Medicine

## 2015-04-17 NOTE — Telephone Encounter (Signed)
.  left message to have patient return my call.  

## 2015-04-17 NOTE — Telephone Encounter (Signed)
2 for each of them??  Please prepare forms and then I can sign

## 2015-04-17 NOTE — Telephone Encounter (Signed)
Pt called wanting you to mail her 4 original [facility]

## 2015-04-18 NOTE — Telephone Encounter (Signed)
4 for just one person????  That is not reasonable

## 2015-04-18 NOTE — Telephone Encounter (Signed)
Spoke with patient and yes she needs 4

## 2015-04-25 NOTE — Telephone Encounter (Signed)
DNR mailed to patients home address , also these are for her husband Neurosurgeon

## 2015-06-10 ENCOUNTER — Other Ambulatory Visit: Payer: Self-pay | Admitting: Internal Medicine

## 2015-06-12 DIAGNOSIS — Z23 Encounter for immunization: Secondary | ICD-10-CM | POA: Diagnosis not present

## 2015-07-11 ENCOUNTER — Other Ambulatory Visit: Payer: Self-pay | Admitting: Internal Medicine

## 2015-07-14 NOTE — Telephone Encounter (Signed)
ptcalled to ck on status of metoprolol refill; advised [facility]. Pt voiced understanding.

## 2015-12-04 ENCOUNTER — Other Ambulatory Visit: Payer: Self-pay | Admitting: Internal Medicine

## 2015-12-08 ENCOUNTER — Ambulatory Visit (INDEPENDENT_AMBULATORY_CARE_PROVIDER_SITE_OTHER): Payer: Medicare Other | Admitting: Family Medicine

## 2015-12-08 ENCOUNTER — Encounter: Payer: Self-pay | Admitting: Family Medicine

## 2015-12-08 VITALS — BP 156/94 | HR 81 | Temp 98.2°F | Wt 156.5 lb

## 2015-12-08 DIAGNOSIS — K529 Noninfective gastroenteritis and colitis, unspecified: Secondary | ICD-10-CM | POA: Diagnosis not present

## 2015-12-08 NOTE — Patient Instructions (Signed)
We will call with the results as soon as we have it.  Follow up closely with Dr. Silvio Pate.  Take care  Dr. Lacinda Axon

## 2015-12-08 NOTE — Progress Notes (Signed)
Pre visit review using our clinic review tool, if applicable. No additional management support is needed unless otherwise documented below in the visit note. 

## 2015-12-08 NOTE — Assessment & Plan Note (Signed)
Worsening diarrhea as of recent. Patient concerned about C. difficile. I doubt this is the case but she has taken antibiotic recently. Will obtain C. difficile. This is more likely an exacerbation of her chronic diarrhea. Likely secondary to stress.

## 2015-12-08 NOTE — Progress Notes (Signed)
Subjective:  Patient ID: Jamie Hodges, female    DOB: 1927/09/26  Age: 82 y.o. MRN: [id number]  CC: Diarrhea  HPI:  82 year old female with the PMH of chronic diarrhea presents with worsening diarrhea.  Diarrhea  Patient has long-standing history of chronic diarrhea.  She states that the past 3 weeks her diarrhea has been worsening.  She states it has been more watery and she's had some difficulty with control.  No associated fever.  She has had some occasional abdominal pain.  She states that she's had some blood with wiping. She thinks she has hemorrhoids.  She has recently taken antibiotics which she states were prescribed by her son here she took this for the diarrhea.  She states that she finished antibiotics on 4/16. She states she took a course of azithromycin.  No recent hospitalization.  She has been spending great deal time of the nursing home with her husband.  No other complaints at this time.  Social Hx   Social History   Social History  . Marital Status: Married    Spouse Name: N/A  . Number of Children: 3  . Years of Education: N/A   Occupational History  . retired 1st grade teacher    Social History Main Topics  . Smoking status: Former Smoker    Quit date: 08/16/1978  . Smokeless tobacco: Never Used  . Alcohol Use: Yes     Comment: white wine  . Drug Use: No  . Sexual Activity: Not Asked   Other Topics Concern  . None   Social History Narrative   Has living will.    Husband then son Mikki Santee (MD) to make health care decisions.   Has DNR order in past and requests again--done   No tube feeds if cognitively unaware            Review of Systems  Constitutional: Negative.   Gastrointestinal: Positive for diarrhea.   Objective:  BP 156/94 mmHg  Pulse 81  Temp(Src) 98.2 F (36.8 C) (Oral)  Wt 156 lb 8 oz (70.988 kg)  SpO2 95%  BP/Weight 12/08/2015 03/05/2015 [id number]  Systolic BP [id number]  Diastolic BP 94 70 82  Wt. (Lbs) 156.5  157 154  BMI 23.8 23.88 23.75   Physical Exam  Constitutional: She appears well-developed. No distress.  Eyes: Conjunctivae are normal. No scleral icterus.  Cardiovascular: Normal rate and regular rhythm.   Pulmonary/Chest: Effort normal. She has no wheezes. She has no rales.  Abdominal: Soft. She exhibits no distension and no mass. There is no tenderness. There is no rebound and no guarding.  Genitourinary: Rectal exam shows external hemorrhoid.  Neurological: She is alert.  Vitals reviewed.  Lab Results  Component Value Date   WBC 6.0 02/27/2014   HGB 13.8 02/27/2014   HCT 40.1 02/27/2014   PLT 252.0 02/27/2014   GLUCOSE 88 02/27/2014   CHOL 231* 01/13/2011   TRIG 212.0* 01/13/2011   HDL 70.10 01/13/2011   LDLDIRECT 161.9 01/13/2011   ALT 14 02/27/2014   AST 20 02/27/2014   NA 140 02/27/2014   K 4.7 02/27/2014   CL 103 02/27/2014   CREATININE 0.9 02/27/2014   BUN 17 02/27/2014   CO2 32 02/27/2014   TSH 2.03 02/26/2013    Assessment & Plan:   Problem List Items Addressed This Visit    Chronic diarrhea - Primary    Worsening diarrhea as of recent. Patient concerned about C. difficile. I doubt this is the  case but she has taken antibiotic recently. Will obtain C. difficile. This is more likely an exacerbation of her chronic diarrhea. Likely secondary to stress.      Relevant Orders   Clostridium Difficile by PCR     Follow-up: PRN  [place]

## 2015-12-09 DIAGNOSIS — K529 Noninfective gastroenteritis and colitis, unspecified: Secondary | ICD-10-CM | POA: Diagnosis not present

## 2015-12-09 DIAGNOSIS — R197 Diarrhea, unspecified: Secondary | ICD-10-CM | POA: Diagnosis not present

## 2015-12-10 LAB — CLOSTRIDIUM DIFFICILE BY PCR: CDIFFPCR: NOT DETECTED

## 2015-12-11 ENCOUNTER — Telehealth: Payer: Self-pay | Admitting: Internal Medicine

## 2015-12-11 NOTE — Telephone Encounter (Signed)
Pt called wanting to get lab results. Please advise pt/msn

## 2015-12-11 NOTE — Telephone Encounter (Signed)
Pt came into office for test results. Please call her cell number or [phone].

## 2015-12-11 NOTE — Telephone Encounter (Signed)
Patient called and given results

## 2015-12-11 NOTE — Telephone Encounter (Signed)
The patient called because she had a question about the results. Please advise pt/msn

## 2015-12-11 NOTE — Telephone Encounter (Signed)
Patient was spoke with and explained her results.

## 2015-12-11 NOTE — Telephone Encounter (Signed)
See previous note created by karen

## 2015-12-15 ENCOUNTER — Ambulatory Visit (INDEPENDENT_AMBULATORY_CARE_PROVIDER_SITE_OTHER): Payer: Medicare Other | Admitting: Internal Medicine

## 2015-12-15 ENCOUNTER — Ambulatory Visit (INDEPENDENT_AMBULATORY_CARE_PROVIDER_SITE_OTHER)
Admission: RE | Admit: 2015-12-15 | Discharge: 2015-12-15 | Disposition: A | Discharge status: Home / Self Care | Payer: Medicare Other | Source: Ambulatory Visit | Attending: Internal Medicine | Admitting: Internal Medicine

## 2015-12-15 ENCOUNTER — Encounter: Payer: Self-pay | Admitting: Internal Medicine

## 2015-12-15 VITALS — BP 138/80 | HR 77 | Temp 98.5°F | Wt 151.0 lb

## 2015-12-15 DIAGNOSIS — K529 Noninfective gastroenteritis and colitis, unspecified: Secondary | ICD-10-CM

## 2015-12-15 DIAGNOSIS — R197 Diarrhea, unspecified: Secondary | ICD-10-CM | POA: Diagnosis not present

## 2015-12-15 NOTE — Assessment & Plan Note (Addendum)
Worse lately No signs of acute illness Will check KUB to assess stool burden----- seems to have sig stool in there but not clear Will await radiologist  If so---try miralax purge If normal--- will switch to cholestyramine daily to bid and limit loperamide

## 2015-12-15 NOTE — Patient Instructions (Signed)
I will call you later to discuss the bowel regimen we will use

## 2015-12-15 NOTE — Progress Notes (Signed)
Pre visit review using our clinic review tool, if applicable. No additional management support is needed unless otherwise documented below in the visit note. 

## 2015-12-15 NOTE — Progress Notes (Signed)
Subjective:    Patient ID: Jamie Hodges, female    DOB: 10-29-1927, 82 y.o.   MRN: [id number]  HPI Here for ongoing issues with diarrhea  Thinks she had C diff ~5 years ago  Now had loose stools starting around 1 month ago Chronic diarrhea--normally 3 loose stools a day Then had yellow secretions and incontinence ---with increased loose stools  No blood in stool No pain Did have z-pak for respiratory illness ~ 2weeks ago but diarrhea started before then Takes up to 6 imodium a day Will have formed stool "once in a blue moon"  Has lost some control Has to be careful about going out---not sure she can stay continent  Current Outpatient Prescriptions on File Prior to Visit  Medication Sig Dispense Refill  . aspirin 81 MG tablet Take 81 mg by mouth daily.      . Loperamide HCl (IMODIUM A-D PO) Take 1 tablet by mouth as needed.     . meclizine (ANTIVERT) 25 MG tablet TAKE ONE TABLET BY MOUTH THREE TIMES DAILY AS NEEDED FOR DIZZINESS OR NAUSEA 60 tablet 0  . metoprolol succinate (TOPROL-XL) 100 MG 24 hr tablet TAKE 1 TABLET DAILY. TAKE WITH OR IMMEDIATELY FOLLOWING A MEAL 90 tablet 0  . ramipril (ALTACE) 10 MG capsule TAKE 1 CAPSULE DAILY 90 capsule 3   No current facility-administered medications on file prior to visit.    Allergies  Allergen Reactions  . Codeine Sulfate     REACTION: rash/ welps  . Penicillins     REACTION: rash and welps  . Crestor [Rosuvastatin Calcium]     myalgia    Past Medical History  Diagnosis Date  . Atrial fibrillation ([place]) 2010    One time during hospital while sick with severe diarrhea  . Hx of colonic polyps   . Hyperlipidemia   . Hypertension   . CAD (coronary artery disease) 2010    minor blockages --no intervention indicated  . C. difficile colitis     3/10  Severe C. dif ---had brief atrial fib then. Cath shows some blockages but no intervention    Past Surgical History  Procedure Laterality Date  . Tonsillectomy and  adenoidectomy    . Ercp N/A 01/14/2015    Procedure: ENDOSCOPIC RETROGRADE CHOLANGIOPANCREATOGRAPHY (ERCP);  Surgeon: Lucilla Lame, MD;  Location: [facility] ENDOSCOPY;  Service: Endoscopy;  Laterality: N/A;  . Cholecystectomy  3/16    Family History  Problem Relation Age of Onset  . COPD Mother   . Coronary artery disease Neg Hx   . Diabetes Neg Hx   . Cancer Neg Hx     breast or colon cancer    Social History   Social History  . Marital Status: Married    Spouse Name: N/A  . Number of Children: 3  . Years of Education: N/A   Occupational History  . retired 1st grade teacher    Social History Main Topics  . Smoking status: Former Smoker    Quit date: 08/16/1978  . Smokeless tobacco: Never Used  . Alcohol Use: Yes     Comment: white wine  . Drug Use: No  . Sexual Activity: Not on file   Other Topics Concern  . Not on file   Social History Narrative   Has living will.    Husband then son Mikki Santee (MD) to make health care decisions.   Has DNR order in past and requests again--done   No tube feeds if cognitively unaware  Review of Systems Appetite is fine Weight is stable  No fever Last colonoscopy around 5 years ago--and normal    Objective:   Physical Exam  Constitutional: She appears well-developed and well-nourished. No distress.  Pulmonary/Chest: Effort normal and breath sounds normal. No respiratory distress. She has no wheezes. She has no rales.  Abdominal: Soft. Bowel sounds are normal. She exhibits no distension. There is no tenderness. There is no rebound and no guarding.          Assessment & Plan:

## 2015-12-17 ENCOUNTER — Ambulatory Visit: Payer: Medicare Other | Admitting: Internal Medicine

## 2015-12-24 ENCOUNTER — Telehealth: Payer: Self-pay

## 2015-12-24 NOTE — Telephone Encounter (Signed)
She can probably add a fiber supplement like fibercon or metamucil/citrucel

## 2015-12-24 NOTE — Telephone Encounter (Signed)
Spoke to patient while she was here.

## 2015-12-24 NOTE — Telephone Encounter (Signed)
Pt last seen 12/15/15; pt taking miralax and seems to be working and pt is feeling good; pt has one soft stool in early AM followed by 3-4 liquid stools; pt is able to rest at night but wonders if there is a med to take to firm stools or does pt need colonoscopy. Pt said had last colonoscopy that was normal 6 years ago. Pt does not hear well on phone and would like an answer while waiting in the waiting room. [place] notified.[place]

## 2016-02-09 DIAGNOSIS — L258 Unspecified contact dermatitis due to other agents: Secondary | ICD-10-CM | POA: Diagnosis not present

## 2016-02-09 DIAGNOSIS — L570 Actinic keratosis: Secondary | ICD-10-CM | POA: Diagnosis not present

## 2016-02-09 DIAGNOSIS — Z85828 Personal history of other malignant neoplasm of skin: Secondary | ICD-10-CM | POA: Diagnosis not present

## 2016-03-03 ENCOUNTER — Ambulatory Visit (INDEPENDENT_AMBULATORY_CARE_PROVIDER_SITE_OTHER): Payer: Medicare Other | Admitting: Internal Medicine

## 2016-03-03 ENCOUNTER — Encounter: Payer: Self-pay | Admitting: Internal Medicine

## 2016-03-03 VITALS — BP 118/70 | HR 132 | Temp 97.9°F | Wt 153.0 lb

## 2016-03-03 DIAGNOSIS — I10 Essential (primary) hypertension: Secondary | ICD-10-CM

## 2016-03-03 LAB — CBC WITH DIFFERENTIAL/PLATELET
BASOS PCT: 0.7 % (ref 0.0–3.0)
Basophils Absolute: 0 10*3/uL (ref 0.0–0.1)
Eosinophils Absolute: 0.1 10*3/uL (ref 0.0–0.7)
Eosinophils Relative: 1.9 % (ref 0.0–5.0)
HEMATOCRIT: 39.7 % (ref 36.0–46.0)
Hemoglobin: 13.4 g/dL (ref 12.0–15.0)
LYMPHS ABS: 2 10*3/uL (ref 0.7–4.0)
LYMPHS PCT: 28.9 % (ref 12.0–46.0)
MCHC: 33.9 g/dL (ref 30.0–36.0)
MCV: 95.5 fl (ref 78.0–100.0)
MONOS PCT: 4.9 % (ref 3.0–12.0)
Monocytes Absolute: 0.3 10*3/uL (ref 0.1–1.0)
NEUTROS ABS: 4.3 10*3/uL (ref 1.4–7.7)
NEUTROS PCT: 63.6 % (ref 43.0–77.0)
PLATELETS: 233 10*3/uL (ref 150.0–400.0)
RBC: 4.16 Mil/uL (ref 3.87–5.11)
RDW: 13.9 % (ref 11.5–15.5)
WBC: 6.8 10*3/uL (ref 4.0–10.5)

## 2016-03-03 LAB — COMPREHENSIVE METABOLIC PANEL
ALT: 15 U/L (ref 0–35)
AST: 20 U/L (ref 0–37)
Albumin: 4.4 g/dL (ref 3.5–5.2)
Alkaline Phosphatase: 68 U/L (ref 39–117)
BILIRUBIN TOTAL: 1.1 mg/dL (ref 0.2–1.2)
BUN: 18 mg/dL (ref 6–23)
CALCIUM: 10 mg/dL (ref 8.4–10.5)
CHLORIDE: 103 meq/L (ref 96–112)
CO2: 31 meq/L (ref 19–32)
CREATININE: 0.93 mg/dL (ref 0.40–1.20)
GFR: 60.71 mL/min (ref >60.00)
GLUCOSE: 94 mg/dL (ref 70–99)
Potassium: 5 mEq/L (ref 3.5–5.1)
Sodium: 140 mEq/L (ref 135–145)
Total Protein: 7 g/dL (ref 6.0–8.3)

## 2016-03-03 LAB — T4, FREE: Free T4: 0.66 ng/dL (ref 0.60–1.60)

## 2016-03-03 MED ORDER — TETANUS-DIPHTHERIA TOXOIDS TD 5-2 LFU IM INJ: 0.5000 mL | INJECTION | Freq: Once | INTRAMUSCULAR | Status: DC

## 2016-03-03 NOTE — Progress Notes (Signed)
Pre visit review using our clinic review tool, if applicable. No additional management support is needed unless otherwise documented below in the visit note. 

## 2016-03-03 NOTE — Progress Notes (Signed)
Subjective:    Patient ID: Jamie Hodges, female    DOB: Dec 05, 1927, 82 y.o.   MRN: [id number] HPI Here for follow up of HTN  Has been using fibercon Wonders if she needs to continue this---goes 2-3 times in AM Discussed continuing this Will use kaopectate prn  Her left eye overflows with tears  ?tear duct blockage No pain, redness Hasn't had routine exam--discussed that this would be a good idea  Still swims regularly Occasionally gets SOB--like walking up hill. Fine in the pool No syncope or dizziness No edema Doesn't check BP  Current Outpatient Prescriptions on File Prior to Visit  Medication Sig Dispense Refill  . aspirin 81 MG tablet Take 81 mg by mouth daily.      . meclizine (ANTIVERT) 25 MG tablet TAKE ONE TABLET BY MOUTH THREE TIMES DAILY AS NEEDED FOR DIZZINESS OR NAUSEA 60 tablet 0  . metoprolol succinate (TOPROL-XL) 100 MG 24 hr tablet TAKE 1 TABLET DAILY. TAKE WITH OR IMMEDIATELY FOLLOWING A MEAL 90 tablet 0  . ramipril (ALTACE) 10 MG capsule TAKE 1 CAPSULE DAILY 90 capsule 3   No current facility-administered medications on file prior to visit.    Allergies  Allergen Reactions  . Codeine Sulfate     REACTION: rash/ welps  . Penicillins     REACTION: rash and welps  . Crestor [Rosuvastatin Calcium]     myalgia    Past Medical History  Diagnosis Date  . Atrial fibrillation ([place]) 2010    One time during hospital while sick with severe diarrhea  . Hx of colonic polyps   . Hyperlipidemia   . Hypertension   . CAD (coronary artery disease) 2010    minor blockages --no intervention indicated  . C. difficile colitis     3/10  Severe C. dif ---had brief atrial fib then. Cath shows some blockages but no intervention    Past Surgical History  Procedure Laterality Date  . Tonsillectomy and adenoidectomy    . Ercp N/A 01/14/2015    Procedure: ENDOSCOPIC RETROGRADE CHOLANGIOPANCREATOGRAPHY (ERCP);  Surgeon: Lucilla Lame, MD;  Location: [facility] ENDOSCOPY;   Service: Endoscopy;  Laterality: N/A;  . Cholecystectomy  3/16    Family History  Problem Relation Age of Onset  . COPD Mother   . Coronary artery disease Neg Hx   . Diabetes Neg Hx   . Cancer Neg Hx     breast or colon cancer    Social History   Social History  . Marital Status: Married    Spouse Name: N/A  . Number of Children: 3  . Years of Education: N/A   Occupational History  . retired 1st grade teacher    Social History Main Topics  . Smoking status: Former Smoker    Quit date: 08/16/1978  . Smokeless tobacco: Never Used  . Alcohol Use: Yes     Comment: white wine  . Drug Use: No  . Sexual Activity: Not on file   Other Topics Concern  . Not on file   Social History Narrative   Has living will.    Husband then son Mikki Santee (MD) to make health care decisions.   Has DNR order in past and requests again--done   No tube feeds if cognitively unaware            Review of Systems  Sleeps well No headaches Appetite is good and weight is stable Uses melatonin at times She prefers no formal wellness visit    Objective:  Physical Exam  Constitutional: She appears well-developed and well-nourished. No distress.  Neck: Normal range of motion. Neck supple. No thyromegaly present.  Cardiovascular: Normal rate, regular rhythm, normal heart sounds and intact distal pulses.  Exam reveals no gallop.   No murmur heard. Pulmonary/Chest: Effort normal and breath sounds normal. No respiratory distress. She has no wheezes. She has no rales.  Abdominal: Soft. There is no tenderness.  Musculoskeletal: She exhibits no edema or tenderness.  Lymphadenopathy:    She has no cervical adenopathy.  Psychiatric: She has a normal mood and affect. Her behavior is normal.          Assessment & Plan:

## 2016-03-03 NOTE — Assessment & Plan Note (Signed)
BP Readings from Last 3 Encounters:  03/03/16 118/70  12/15/15 138/80  12/08/15 156/94   Doing well on meds Will continue Check labs

## 2016-03-10 DIAGNOSIS — L570 Actinic keratosis: Secondary | ICD-10-CM | POA: Diagnosis not present

## 2016-03-10 DIAGNOSIS — L579 Skin changes due to chronic exposure to nonionizing radiation, unspecified: Secondary | ICD-10-CM | POA: Diagnosis not present

## 2016-03-10 DIAGNOSIS — Z85828 Personal history of other malignant neoplasm of skin: Secondary | ICD-10-CM | POA: Diagnosis not present

## 2016-03-16 ENCOUNTER — Telehealth: Payer: Self-pay | Admitting: Internal Medicine

## 2016-03-16 ENCOUNTER — Other Ambulatory Visit: Payer: Self-pay | Admitting: Internal Medicine

## 2016-03-16 NOTE — Telephone Encounter (Signed)
Sounds okay to wait till tomorrow

## 2016-03-16 NOTE — Telephone Encounter (Signed)
Patient Name: Jamie Hodges  DOB: Dec 31, 1927    Initial Comment caller states she has diarrhea   Nurse Assessment  Nurse: Raphael Gibney, RN, Vanita Ingles Date/Time (Eastern Time): 03/16/2016 10:38:00 AM  Confirm and document reason for call. If symptomatic, describe symptoms. You must click the next button to save text entered. ---Caller states she is having diarrhea. She has been taking Fiber con. Passed a lot of gas yesterday. today she has had 4-5 diarrhea stools. no vomiting. No blood in her diarrhea.  Has the patient traveled out of the country within the last 30 days? ---Not Applicable  Does the patient have any new or worsening symptoms? ---Yes  Will a triage be completed? ---Yes  Related visit to physician within the last 2 weeks? ---No  Does the PT have any chronic conditions? (i.e. diabetes, asthma, etc.) ---No  Is this a behavioral health or substance abuse call? ---No     Guidelines    Guideline Title Affirmed Question Affirmed Notes  Diarrhea [1] MILD (e.g., 1-3 or more stools than normal in past 24 hours) diarrhea without known cause AND [2] present > 7 days    Final Disposition User   See PCP When Office is Open (within 3 days) Raphael Gibney, RN, Vanita Ingles    Comments  Appt scheduled for 12:30 pm on 03/17/2016 at 12:30 pm with Dr. Viviana Simpler   Referrals  REFERRED TO PCP OFFICE   Disagree/Comply: Comply

## 2016-03-16 NOTE — Telephone Encounter (Signed)
Pt has appt with Dr Silvio Pate 03/17/16 at 12:30.

## 2016-03-17 ENCOUNTER — Encounter: Payer: Self-pay | Admitting: Internal Medicine

## 2016-03-17 ENCOUNTER — Ambulatory Visit (INDEPENDENT_AMBULATORY_CARE_PROVIDER_SITE_OTHER): Payer: Medicare Other | Admitting: Internal Medicine

## 2016-03-17 DIAGNOSIS — K529 Noninfective gastroenteritis and colitis, unspecified: Secondary | ICD-10-CM

## 2016-03-17 NOTE — Patient Instructions (Signed)
Please cut back the fibercon to no more than 2 a day--or stop completely. If you are not moving your bowels well (especially not clearly emptying then), please try miralax again (like 1 capful daily) We will set you up with the GI specialist

## 2016-03-17 NOTE — Progress Notes (Signed)
Pre visit review using our clinic review tool, if applicable. No additional management support is needed unless otherwise documented below in the visit note. 

## 2016-03-17 NOTE — Progress Notes (Signed)
Subjective:    Patient ID: Jamie Hodges, female    DOB: [birth year], 82 y.o.   MRN: [id number]  HPI Here due to ongoing problems with diarrhea  Had been improved as of the last visit Feels something on her left side Feels full of gas ---especially at night (but intermittent) Has been taking fibercon-- 3 AM and 2 PM Took the miralax the last time she was in --but then stopped it  Still with 2-3 loose stools a day Has some fecal staining--but not total incontinence No blood in stool Does feel like she empties No abdominal pain per se--just feels something in LLQ at times  Current Outpatient Prescriptions on File Prior to Visit  Medication Sig Dispense Refill  . aspirin 81 MG tablet Take 81 mg by mouth daily.      . meclizine (ANTIVERT) 25 MG tablet TAKE ONE TABLET BY MOUTH THREE TIMES DAILY AS NEEDED FOR DIZZINESS OR NAUSEA 60 tablet 0  . metoprolol succinate (TOPROL-XL) 100 MG 24 hr tablet TAKE 1 TABLET DAILY. TAKE WITH OR IMMEDIATELY FOLLOWING A MEAL 90 tablet 0  . ramipril (ALTACE) 10 MG capsule TAKE 1 CAPSULE DAILY 90 capsule 3   No current facility-administered medications on file prior to visit.     Allergies  Allergen Reactions  . Codeine Sulfate     REACTION: rash/ welps  . Penicillins     REACTION: rash and welps  . Crestor [Rosuvastatin Calcium]     myalgia    Past Medical History:  Diagnosis Date  . Atrial fibrillation ([place]) 2010   One time during hospital while sick with severe diarrhea  . C. difficile colitis    3/10  Severe C. dif ---had brief atrial fib then. Cath shows some blockages but no intervention  . CAD (coronary artery disease) 2010   minor blockages --no intervention indicated  . Hx of colonic polyps   . Hyperlipidemia   . Hypertension     Past Surgical History:  Procedure Laterality Date  . CHOLECYSTECTOMY  3/16  . ERCP N/A 01/14/2015   Procedure: ENDOSCOPIC RETROGRADE CHOLANGIOPANCREATOGRAPHY (ERCP);  Surgeon: Lucilla Lame, MD;  Location:  [facility] ENDOSCOPY;  Service: Endoscopy;  Laterality: N/A;  . TONSILLECTOMY AND ADENOIDECTOMY      Family History  Problem Relation Age of Onset  . COPD Mother   . Coronary artery disease Neg Hx   . Diabetes Neg Hx   . Cancer Neg Hx     breast or colon cancer    Social History   Social History  . Marital status: Married    Spouse name: N/A  . Number of children: 3  . Years of education: N/A   Occupational History  . retired 1st grade teacher    Social History Main Topics  . Smoking status: Former Smoker    Quit date: 08/16/1978  . Smokeless tobacco: Never Used  . Alcohol use Yes     Comment: white wine  . Drug use: No  . Sexual activity: Not on file   Other Topics Concern  . Not on file   Social History Narrative   Has living will.    Husband then son Mikki Santee (MD) to make health care decisions.   Has DNR order in past and requests again--done   No tube feeds if cognitively unaware            Review of Systems No N/V Appetite is okay Some reflux--no meds regularly for this No cough or SOB  Objective:   Physical Exam  Constitutional: She appears well-developed and well-nourished. No distress.  Neck: Normal range of motion. Neck supple. No thyromegaly present.  Pulmonary/Chest: Effort normal and breath sounds normal. No respiratory distress. She has no wheezes. She has no rales.  Abdominal: Soft. Bowel sounds are normal. She exhibits no distension and no mass. There is no tenderness. There is no rebound and no guarding.  Lymphadenopathy:    She has no cervical adenopathy.          Assessment & Plan:

## 2016-03-17 NOTE — Assessment & Plan Note (Signed)
Ongoing problems I still suspect high fecal burden---  She didn't continue the miralax like I thought she was going to, and now taking high doses of fibercon. This is probably causing the bloating Asked her to cut back or stop the fibercon May need to restart the miralax Will send to GI for evaluation due to persistent problems

## 2016-03-24 ENCOUNTER — Encounter: Payer: Self-pay | Admitting: Internal Medicine

## 2016-03-24 ENCOUNTER — Ambulatory Visit (INDEPENDENT_AMBULATORY_CARE_PROVIDER_SITE_OTHER): Payer: Medicare Other | Admitting: Internal Medicine

## 2016-03-24 DIAGNOSIS — K529 Noninfective gastroenteritis and colitis, unspecified: Secondary | ICD-10-CM | POA: Diagnosis not present

## 2016-03-24 NOTE — Assessment & Plan Note (Signed)
Better now Gas gone off the fiber Discussed regimen if she doesn't go regularly

## 2016-03-24 NOTE — Progress Notes (Signed)
   Subjective:    Patient ID: Jamie Hodges, female    DOB: Mar 17, 1928, 82 y.o.   MRN: [id number]  HPI Here for follow up of diarrhea Better now Rare loose stools Bowels are regular No abdominal pain Gas is mostly better Goes once or twice a day--solid and feels like she is emptying (only in AM)  Wonders about the GI appointment  Current Outpatient Prescriptions on File Prior to Visit  Medication Sig Dispense Refill  . aspirin 81 MG tablet Take 81 mg by mouth daily.      . meclizine (ANTIVERT) 25 MG tablet TAKE ONE TABLET BY MOUTH THREE TIMES DAILY AS NEEDED FOR DIZZINESS OR NAUSEA 60 tablet 0  . metoprolol succinate (TOPROL-XL) 100 MG 24 hr tablet TAKE 1 TABLET DAILY. TAKE WITH OR IMMEDIATELY FOLLOWING A MEAL 90 tablet 0  . ramipril (ALTACE) 10 MG capsule TAKE 1 CAPSULE DAILY 90 capsule 3   No current facility-administered medications on file prior to visit.     Allergies  Allergen Reactions  . Codeine Sulfate     REACTION: rash/ welps  . Penicillins     REACTION: rash and welps  . Crestor [Rosuvastatin Calcium]     myalgia    Past Medical History:  Diagnosis Date  . Atrial fibrillation ([place]) 2010   One time during hospital while sick with severe diarrhea  . C. difficile colitis    3/10  Severe C. dif ---had brief atrial fib then. Cath shows some blockages but no intervention  . CAD (coronary artery disease) 2010   minor blockages --no intervention indicated  . Hx of colonic polyps   . Hyperlipidemia   . Hypertension     Past Surgical History:  Procedure Laterality Date  . CHOLECYSTECTOMY  3/16  . ERCP N/A 01/14/2015   Procedure: ENDOSCOPIC RETROGRADE CHOLANGIOPANCREATOGRAPHY (ERCP);  Surgeon: Lucilla Lame, MD;  Location: [facility] ENDOSCOPY;  Service: Endoscopy;  Laterality: N/A;  . TONSILLECTOMY AND ADENOIDECTOMY      Family History  Problem Relation Age of Onset  . COPD Mother   . Coronary artery disease Neg Hx   . Diabetes Neg Hx   . Cancer Neg Hx     breast  or colon cancer    Social History   Social History  . Marital status: Married    Spouse name: N/A  . Number of children: 3  . Years of education: N/A   Occupational History  . retired 1st grade teacher    Social History Main Topics  . Smoking status: Former Smoker    Quit date: 08/16/1978  . Smokeless tobacco: Never Used  . Alcohol use Yes     Comment: white wine  . Drug use: No  . Sexual activity: Not on file   Other Topics Concern  . Not on file   Social History Narrative   Has living will.    Husband then son Mikki Santee (MD) to make health care decisions.   Has DNR order in past and requests again--done   No tube feeds if cognitively unaware            Review of Systems No N/V Appetite is good    Objective:   Physical Exam  Abdominal: Soft. She exhibits no distension. There is no tenderness. There is no rebound and no guarding.          Assessment & Plan:

## 2016-03-24 NOTE — Progress Notes (Signed)
Pre visit review using our clinic review tool, if applicable. No additional management support is needed unless otherwise documented below in the visit note. 

## 2016-03-24 NOTE — Patient Instructions (Signed)
I would recommend miralax--a full capful with water (or other drink) if you haven't gone to the bathroom the day before.

## 2016-04-27 ENCOUNTER — Ambulatory Visit: Payer: Self-pay | Admitting: Gastroenterology

## 2016-05-08 DIAGNOSIS — Z23 Encounter for immunization: Secondary | ICD-10-CM | POA: Diagnosis not present

## 2016-05-12 ENCOUNTER — Other Ambulatory Visit: Payer: Self-pay | Admitting: Internal Medicine

## 2016-05-26 ENCOUNTER — Telehealth: Payer: Self-pay

## 2016-05-26 ENCOUNTER — Ambulatory Visit (INDEPENDENT_AMBULATORY_CARE_PROVIDER_SITE_OTHER): Payer: Medicare Other | Admitting: Internal Medicine

## 2016-05-26 ENCOUNTER — Encounter: Payer: Self-pay | Admitting: Internal Medicine

## 2016-05-26 VITALS — BP 106/64 | HR 81 | Temp 98.1°F | Wt 152.0 lb

## 2016-05-26 DIAGNOSIS — J069 Acute upper respiratory infection, unspecified: Secondary | ICD-10-CM

## 2016-05-26 DIAGNOSIS — K589 Irritable bowel syndrome without diarrhea: Secondary | ICD-10-CM | POA: Insufficient documentation

## 2016-05-26 DIAGNOSIS — K580 Irritable bowel syndrome with diarrhea: Secondary | ICD-10-CM | POA: Diagnosis not present

## 2016-05-26 MED ORDER — HYOSCYAMINE SULFATE 0.125 MG SL SUBL: 0.1250 mg | SUBLINGUAL_TABLET | Freq: Every day | SUBLINGUAL | 1 refills | Status: DC | PRN

## 2016-05-26 NOTE — Assessment & Plan Note (Signed)
Does describe typical IBS symptoms Nothing to suggest IBD Discussed probiotic Fiber caused gas Consider antispasmodic--will give small quantity of levsin (discussed side effects)

## 2016-05-26 NOTE — Patient Instructions (Signed)
Please try a daily probiotic.

## 2016-05-26 NOTE — Progress Notes (Signed)
Subjective:    Patient ID: Jamie Hodges, female    DOB: 1928-04-14, 82 y.o.   MRN: [id number]  HPI Here due to ongoing bowel problems She is concerned that this might be colitis Will get periods of loose stools---either in AM or afternoon Has been tried [employer] gas and then cramping Then has to run to the bathroom Loose stools--then feels better Happens just about every day  Worried about her sinuses Wonders about needing antibiotic No fever or SOB Started about a week ago Hasn't tried anything Slight cough  Wants to try I.B.Donald Prose (OTC product) Asks about this-but doesn't know what it is  Not sure if she has tried a probiotic  Feels good Not losing weight No blood in stool (except occasionally on paper from hemorrhoid)  Current Outpatient Prescriptions on File Prior to Visit  Medication Sig Dispense Refill  . aspirin 81 MG tablet Take 81 mg by mouth daily.      . meclizine (ANTIVERT) 25 MG tablet TAKE ONE TABLET BY MOUTH THREE TIMES DAILY AS NEEDED FOR DIZZINESS OR NAUSEA 60 tablet 0  . metoprolol succinate (TOPROL-XL) 100 MG 24 hr tablet TAKE 1 TABLET DAILY. TAKE WITH OR IMMEDIATELY FOLLOWING A MEAL 90 tablet 3  . ramipril (ALTACE) 10 MG capsule TAKE 1 CAPSULE DAILY 90 capsule 3   No current facility-administered medications on file prior to visit.     Allergies  Allergen Reactions  . Codeine Sulfate     REACTION: rash/ welps  . Penicillins     REACTION: rash and welps  . Crestor [Rosuvastatin Calcium]     myalgia    Past Medical History:  Diagnosis Date  . Atrial fibrillation ([place]) 2010   One time during hospital while sick with severe diarrhea  . C. difficile colitis    3/10  Severe C. dif ---had brief atrial fib then. Cath shows some blockages but no intervention  . CAD (coronary artery disease) 2010   minor blockages --no intervention indicated  . Hx of colonic polyps   . Hyperlipidemia   . Hypertension     Past Surgical History:    Procedure Laterality Date  . CHOLECYSTECTOMY  3/16  . ERCP N/A 01/14/2015   Procedure: ENDOSCOPIC RETROGRADE CHOLANGIOPANCREATOGRAPHY (ERCP);  Surgeon: Lucilla Lame, MD;  Location: [facility] ENDOSCOPY;  Service: Endoscopy;  Laterality: N/A;  . TONSILLECTOMY AND ADENOIDECTOMY      Family History  Problem Relation Age of Onset  . COPD Mother   . Coronary artery disease Neg Hx   . Diabetes Neg Hx   . Cancer Neg Hx     breast or colon cancer    Social History   Social History  . Marital status: Married    Spouse name: N/A  . Number of children: 3  . Years of education: N/A   Occupational History  . retired 1st grade teacher    Social History Main Topics  . Smoking status: Former Smoker    Quit date: 08/16/1978  . Smokeless tobacco: Never Used  . Alcohol use Yes     Comment: white wine  . Drug use: No  . Sexual activity: Not on file   Other Topics Concern  . Not on file   Social History Narrative   Has living will.    Husband then son Mikki Santee (MD) to make health care decisions.   Has DNR order in past and requests again--done   No tube feeds if cognitively unaware  Review of Systems Appetite is good Weight is stable    Objective:   Physical Exam  Constitutional: She appears well-developed and well-nourished. No distress.  HENT:  Mouth/Throat: Oropharynx is clear and moist. No oropharyngeal exudate.  No sinus tenderness   Neck: Normal range of motion. Neck supple. No thyromegaly present.  Pulmonary/Chest: Effort normal and breath sounds normal. No respiratory distress. She has no wheezes. She has no rales.  Abdominal: Soft. There is no tenderness.  Musculoskeletal: She exhibits no edema.  Lymphadenopathy:    She has no cervical adenopathy.  Psychiatric: She has a normal mood and affect. Her behavior is normal.          Assessment & Plan:

## 2016-05-26 NOTE — Telephone Encounter (Signed)
I would not recommend that Have her try probiotic---like align pills, or activia yogurt (daily)

## 2016-05-26 NOTE — Assessment & Plan Note (Signed)
Discussed--doesn't seem bacterial Supportive care only for now

## 2016-05-26 NOTE — Telephone Encounter (Signed)
Pt brought the box by for the IBGard OTC supplement she has been taking.   Each capsule contains 90mg  Ultra Purified Peppermint Oil. Everything else are fillers and binders.  She said we can send her a response on MyChart

## 2016-05-26 NOTE — Telephone Encounter (Signed)
Spoke to pt. She appreciated the feedback

## 2016-05-26 NOTE — Progress Notes (Signed)
Pre visit review using our clinic review tool, if applicable. No additional management support is needed unless otherwise documented below in the visit note. 

## 2016-05-31 ENCOUNTER — Telehealth: Payer: Self-pay

## 2016-05-31 MED ORDER — CHOLESTYRAMINE LIGHT 4 G PO PACK: 4.0000 g | PACK | Freq: Two times a day (BID) | ORAL | 3 refills | Status: DC

## 2016-05-31 NOTE — Telephone Encounter (Signed)
Spoke to pt

## 2016-05-31 NOTE — Telephone Encounter (Signed)
Please let her know that I sent the prescription for the powder stuff that should bind her up. She can try twice a day till the diarrhea slows down--then probably cut it back to once a day

## 2016-05-31 NOTE — Telephone Encounter (Signed)
Pt last seen 05/26/16 with IBS; diarrhea has worsened today. In the last 24 hours had 5 - 6 watery diarrhea.No N&V, no mucus in stool and no fever. Pt is able to drink. Levsin is not helping. Pt request med to stop diarrhea. [facility] [address]. Pt request cb.

## 2016-06-06 ENCOUNTER — Encounter: Payer: Self-pay | Admitting: Internal Medicine

## 2016-06-15 ENCOUNTER — Telehealth: Payer: Self-pay | Admitting: *Deleted

## 2016-06-15 NOTE — Telephone Encounter (Signed)
Patient left a voicemail requesting 3 copies of a DNR for herself. Patient requested that they be mailed to her.

## 2016-06-17 NOTE — Telephone Encounter (Signed)
3 DNRs prepared for Dr Silvio Pate to sign. Will mail them to her after he signs them today.

## 2016-07-07 DIAGNOSIS — L570 Actinic keratosis: Secondary | ICD-10-CM | POA: Diagnosis not present

## 2016-07-07 DIAGNOSIS — L738 Other specified follicular disorders: Secondary | ICD-10-CM | POA: Diagnosis not present

## 2016-08-30 ENCOUNTER — Other Ambulatory Visit: Payer: Self-pay

## 2016-08-30 MED ORDER — CHOLESTYRAMINE LIGHT 4 G PO PACK: 4.0000 g | PACK | Freq: Two times a day (BID) | ORAL | 3 refills | Status: DC

## 2016-08-30 NOTE — Telephone Encounter (Signed)
Pt needs a refill of Prevalite sent to [facility]. I have sent the rx to [facility]

## 2016-09-13 ENCOUNTER — Telehealth: Payer: Self-pay | Admitting: Internal Medicine

## 2016-09-13 MED ORDER — OSELTAMIVIR PHOSPHATE 75 MG PO CAPS: 75.0000 mg | ORAL_CAPSULE | Freq: Every day | ORAL | 0 refills | Status: DC

## 2016-09-13 NOTE — Telephone Encounter (Signed)
Pt called because [facility] called her to advise that Dr. Silvio Pate rec. Tamiflu.  She is requesting that we call this in to [facility] on [address].  Please call her to confirm.

## 2016-09-13 NOTE — Telephone Encounter (Signed)
Yes---start her on 75mg  daily #10 x 0

## 2016-09-13 NOTE — Telephone Encounter (Signed)
Left a message got the pt that we have sent in a rx for tamiflu

## 2016-10-21 DIAGNOSIS — L270 Generalized skin eruption due to drugs and medicaments taken internally: Secondary | ICD-10-CM | POA: Diagnosis not present

## 2016-10-21 DIAGNOSIS — L509 Urticaria, unspecified: Secondary | ICD-10-CM | POA: Diagnosis not present

## 2016-10-21 DIAGNOSIS — T360X5A Adverse effect of penicillins, initial encounter: Secondary | ICD-10-CM | POA: Diagnosis not present

## 2016-10-22 ENCOUNTER — Encounter: Payer: Self-pay | Admitting: Family Medicine

## 2016-10-22 ENCOUNTER — Ambulatory Visit (INDEPENDENT_AMBULATORY_CARE_PROVIDER_SITE_OTHER): Payer: Medicare Other | Admitting: Family Medicine

## 2016-10-22 DIAGNOSIS — T7840XA Allergy, unspecified, initial encounter: Secondary | ICD-10-CM

## 2016-10-22 MED ORDER — EPINEPHRINE 0.3 MG/0.3ML IJ SOAJ: 0.3000 mg | Freq: Once | INTRAMUSCULAR | 0 refills | Status: AC

## 2016-10-22 NOTE — Progress Notes (Signed)
H/o PCN.  Known allergy.  She took a pill of amoxil by accident.  It got switched- it was her husband's rx.  She took about 3 doses before noticing the error.  Last dose was about 1 week ago.  iniitally with light rash.  Now with more diffuse rash.  She was given steroid injection yesterday at dermatology clinic.  Taking allegra in the AM, claritin in the PM, periactin at night with benadryl at night.    She also took benadryl in the AM today.  Not drowsy, but slightly jumpy in the meantime.  D/w pt, this may be from steroid injection yesteday.   It feels like (but not visible yet) that her top lip is slightly puffy.  No lower lip or tongue changes.  No wheeze.  Not SOB.    Meds, vitals, and allergies reviewed.   ROS: Per HPI unless specifically indicated in ROS section   GEN: nad, alert and oriented HEENT: mucous membranes moist, lips and tongue all normal appearing. No stridor. NECK: supple w/o LA CV: rrr.  no murmur PULM: ctab, no inc wob, no wheeze ABD: soft, +bs EXT: no edema SKIN: Diffuse blanching erythematous rash on the trunk and extremities but spares the palms, no ulceration.

## 2016-10-22 NOTE — Patient Instructions (Signed)
Keep taking claritin and allegra with benadryl if needed.  Jittery feeling likely from the previous steroid injection.  If lip or tongue swelling, then use the epipen and dial 911.  Take care.  Glad to see you. Avoid all amoxicillin and penicillin.

## 2016-10-22 NOTE — Progress Notes (Signed)
Pre visit review using our clinic review tool, if applicable. No additional management support is needed unless otherwise documented below in the visit note. 

## 2016-10-24 DIAGNOSIS — T7840XA Allergy, unspecified, initial encounter: Secondary | ICD-10-CM | POA: Insufficient documentation

## 2016-10-24 NOTE — Assessment & Plan Note (Signed)
Presumed rash from amoxicillin exposure. Discussed with patient. She is off medication now. Continue antihistamines as prescribed by outside M.D. Discussed with patient about jittery feeling. Likely due to steroid injection yesterday. Prescription given for EpiPen. If emergent symptoms, use EpiPen and down 911 or go to ER. She agrees. Okay for outpatient follow-up. No sign of angioedema or airway compromise at this point. Her lips and tongue look normal.

## 2016-11-18 DIAGNOSIS — H6121 Impacted cerumen, right ear: Secondary | ICD-10-CM | POA: Diagnosis not present

## 2016-11-18 DIAGNOSIS — H903 Sensorineural hearing loss, bilateral: Secondary | ICD-10-CM | POA: Diagnosis not present

## 2017-03-02 ENCOUNTER — Ambulatory Visit (INDEPENDENT_AMBULATORY_CARE_PROVIDER_SITE_OTHER): Payer: Medicare Other | Admitting: Internal Medicine

## 2017-03-02 ENCOUNTER — Encounter: Payer: Self-pay | Admitting: Internal Medicine

## 2017-03-02 VITALS — BP 126/80 | HR 79 | Temp 97.6°F | Wt 150.0 lb

## 2017-03-02 DIAGNOSIS — Z23 Encounter for immunization: Secondary | ICD-10-CM | POA: Diagnosis not present

## 2017-03-02 DIAGNOSIS — K529 Noninfective gastroenteritis and colitis, unspecified: Secondary | ICD-10-CM | POA: Diagnosis not present

## 2017-03-02 DIAGNOSIS — I10 Essential (primary) hypertension: Secondary | ICD-10-CM | POA: Diagnosis not present

## 2017-03-02 DIAGNOSIS — R252 Cramp and spasm: Secondary | ICD-10-CM | POA: Diagnosis not present

## 2017-03-02 DIAGNOSIS — Z7189 Other specified counseling: Secondary | ICD-10-CM | POA: Diagnosis not present

## 2017-03-02 LAB — CBC WITH DIFFERENTIAL/PLATELET
BASOS ABS: 0 10*3/uL (ref 0.0–0.1)
Basophils Relative: 0.7 % (ref 0.0–3.0)
Eosinophils Absolute: 0.1 10*3/uL (ref 0.0–0.7)
Eosinophils Relative: 1.7 % (ref 0.0–5.0)
HCT: 39.9 % (ref 36.0–46.0)
Hemoglobin: 13.3 g/dL (ref 12.0–15.0)
LYMPHS ABS: 1.8 10*3/uL (ref 0.7–4.0)
Lymphocytes Relative: 27.5 % (ref 12.0–46.0)
MCHC: 33.4 g/dL (ref 30.0–36.0)
MCV: 98.1 fl (ref 78.0–100.0)
MONO ABS: 0.3 10*3/uL (ref 0.1–1.0)
Monocytes Relative: 4.8 % (ref 3.0–12.0)
NEUTROS PCT: 65.3 % (ref 43.0–77.0)
Neutro Abs: 4.2 10*3/uL (ref 1.4–7.7)
Platelets: 220 10*3/uL (ref 150.0–400.0)
RBC: 4.06 Mil/uL (ref 3.87–5.11)
RDW: 13.4 % (ref 11.5–15.5)
WBC: 6.4 10*3/uL (ref 4.0–10.5)

## 2017-03-02 LAB — COMPREHENSIVE METABOLIC PANEL
ALK PHOS: 56 U/L (ref 39–117)
ALT: 11 U/L (ref 0–35)
AST: 17 U/L (ref 0–37)
Albumin: 4.3 g/dL (ref 3.5–5.2)
BILIRUBIN TOTAL: 0.8 mg/dL (ref 0.2–1.2)
BUN: 18 mg/dL (ref 6–23)
CO2: 30 mEq/L (ref 19–32)
Calcium: 9.9 mg/dL (ref 8.4–10.5)
Chloride: 105 mEq/L (ref 96–112)
Creatinine, Ser: 0.9 mg/dL (ref 0.40–1.20)
GFR: 62.91 mL/min (ref >60.00)
GLUCOSE: 106 mg/dL — AB (ref 70–99)
Potassium: 4.4 mEq/L (ref 3.5–5.1)
SODIUM: 142 meq/L (ref 135–145)
TOTAL PROTEIN: 6.9 g/dL (ref 6.0–8.3)

## 2017-03-02 LAB — T4, FREE: FREE T4: 0.71 ng/dL (ref 0.60–1.60)

## 2017-03-02 LAB — MAGNESIUM: MAGNESIUM: 2.2 mg/dL (ref 1.5–2.5)

## 2017-03-02 NOTE — Assessment & Plan Note (Signed)
Has DNR 

## 2017-03-02 NOTE — Assessment & Plan Note (Signed)
BP Readings from Last 3 Encounters:  03/02/17 126/80  10/22/16 (!) 150/80  05/26/16 106/64   Good control No changes in meds

## 2017-03-02 NOTE — Progress Notes (Signed)
Subjective:    Patient ID: Jamie Hodges, female    DOB: [birth year], 83 y.o.   MRN: [id number]  HPI Here for follow up of HTN and other chronic medical conditions She prefers no wellness visit  Husband is declining --she is okay with this Discussed this  She is involved socially and doing okay She notices "impatience" with ineptness----like someone making mistake at bridge table No regular mood problems  No chest pain No SOB Still swims regularly No dizziness or syncope  Doing great on the cholestyramine Now can go out socially, etc. No blood in stools  Current Outpatient Prescriptions on File Prior to Visit  Medication Sig Dispense Refill  . aspirin 81 MG tablet Take 81 mg by mouth daily.      . cholestyramine light (PREVALITE) 4 g packet Take 1 packet (4 g total) by mouth 2 (two) times daily. 180 packet 3  . meclizine (ANTIVERT) 25 MG tablet TAKE ONE TABLET BY MOUTH THREE TIMES DAILY AS NEEDED FOR DIZZINESS OR NAUSEA 60 tablet 0  . metoprolol succinate (TOPROL-XL) 100 MG 24 hr tablet TAKE 1 TABLET DAILY. TAKE WITH OR IMMEDIATELY FOLLOWING A MEAL 90 tablet 3  . ramipril (ALTACE) 10 MG capsule TAKE 1 CAPSULE DAILY 90 capsule 3   No current facility-administered medications on file prior to visit.     Allergies  Allergen Reactions  . Codeine Sulfate     REACTION: rash/ welps  . Penicillins     REACTION: rash and welps  . Crestor [Rosuvastatin Calcium]     myalgia    Past Medical History:  Diagnosis Date  . Atrial fibrillation ([place]) 2010   One time during hospital while sick with severe diarrhea  . C. difficile colitis    3/10  Severe C. dif ---had brief atrial fib then. Cath shows some blockages but no intervention  . CAD (coronary artery disease) 2010   minor blockages --no intervention indicated  . Hx of colonic polyps   . Hyperlipidemia   . Hypertension     Past Surgical History:  Procedure Laterality Date  . CHOLECYSTECTOMY  3/16  . ERCP N/A 01/14/2015     Procedure: ENDOSCOPIC RETROGRADE CHOLANGIOPANCREATOGRAPHY (ERCP);  Surgeon: Lucilla Lame, MD;  Location: [facility] ENDOSCOPY;  Service: Endoscopy;  Laterality: N/A;  . TONSILLECTOMY AND ADENOIDECTOMY      Family History  Problem Relation Age of Onset  . COPD Mother   . Coronary artery disease Neg Hx   . Diabetes Neg Hx   . Cancer Neg Hx        breast or colon cancer    Social History   Social History  . Marital status: Married    Spouse name: N/A  . Number of children: 3  . Years of education: N/A   Occupational History  . retired 1st grade teacher    Social History Main Topics  . Smoking status: Former Smoker    Quit date: 08/16/1978  . Smokeless tobacco: Never Used  . Alcohol use Yes     Comment: white wine  . Drug use: No  . Sexual activity: Not on file   Other Topics Concern  . Not on file   Social History Narrative   Has living will.    Son Jamie Hodges (MD) to make health care decisions.--then son Jamie Hodges   Has DNR order in past and requests again--done   No tube feeds if cognitively unaware            Review of  Systems No falls  No depression or anhedonia Rare vertigo now Appetite is good Weight is stable Not sleeping so well---worse recently Frequent urination---urgency and some incontinence (wears pad) Cramps in legs and sometimes hands at night--discussed remedies    Objective:   Physical Exam  Constitutional: She appears well-nourished. No distress.  Neck: No thyromegaly present.  Cardiovascular: Normal rate, regular rhythm, normal heart sounds and intact distal pulses.  Exam reveals no gallop.   No murmur heard. Pulmonary/Chest: Effort normal and breath sounds normal. No respiratory distress. She has no wheezes. She has no rales.  Abdominal: Soft. She exhibits no distension. There is no tenderness. There is no rebound and no guarding.  Musculoskeletal: She exhibits no edema.  Lymphadenopathy:    She has no cervical adenopathy.  Skin: No rash noted. No  erythema.  Psychiatric: She has a normal mood and affect. Her behavior is normal.          Assessment & Plan:

## 2017-03-02 NOTE — Addendum Note (Signed)
Addended by: Pilar Grammes on: 03/02/2017 10:00 AM   Modules accepted: Orders

## 2017-03-02 NOTE — Assessment & Plan Note (Signed)
Doesn't sound pathologic Will check magnesium level and other labs

## 2017-03-02 NOTE — Assessment & Plan Note (Signed)
Really happy with the cholestyramine

## 2017-03-09 ENCOUNTER — Ambulatory Visit: Payer: Medicare Other | Admitting: Internal Medicine

## 2017-03-31 DIAGNOSIS — C44319 Basal cell carcinoma of skin of other parts of face: Secondary | ICD-10-CM | POA: Diagnosis not present

## 2017-03-31 DIAGNOSIS — L570 Actinic keratosis: Secondary | ICD-10-CM | POA: Diagnosis not present

## 2017-03-31 DIAGNOSIS — D485 Neoplasm of uncertain behavior of skin: Secondary | ICD-10-CM | POA: Diagnosis not present

## 2017-03-31 DIAGNOSIS — L579 Skin changes due to chronic exposure to nonionizing radiation, unspecified: Secondary | ICD-10-CM | POA: Diagnosis not present

## 2017-04-14 DIAGNOSIS — L579 Skin changes due to chronic exposure to nonionizing radiation, unspecified: Secondary | ICD-10-CM | POA: Diagnosis not present

## 2017-04-14 DIAGNOSIS — C44319 Basal cell carcinoma of skin of other parts of face: Secondary | ICD-10-CM | POA: Diagnosis not present

## 2017-04-14 DIAGNOSIS — L570 Actinic keratosis: Secondary | ICD-10-CM | POA: Diagnosis not present

## 2017-04-14 DIAGNOSIS — Z85828 Personal history of other malignant neoplasm of skin: Secondary | ICD-10-CM | POA: Diagnosis not present

## 2017-06-02 DIAGNOSIS — Z85828 Personal history of other malignant neoplasm of skin: Secondary | ICD-10-CM | POA: Diagnosis not present

## 2017-06-02 DIAGNOSIS — L570 Actinic keratosis: Secondary | ICD-10-CM | POA: Diagnosis not present

## 2017-06-02 DIAGNOSIS — L579 Skin changes due to chronic exposure to nonionizing radiation, unspecified: Secondary | ICD-10-CM | POA: Diagnosis not present

## 2017-06-23 DIAGNOSIS — Z23 Encounter for immunization: Secondary | ICD-10-CM | POA: Diagnosis not present

## 2017-06-28 ENCOUNTER — Other Ambulatory Visit: Payer: Self-pay | Admitting: Internal Medicine

## 2017-07-02 ENCOUNTER — Other Ambulatory Visit: Payer: Self-pay | Admitting: Internal Medicine

## 2017-09-05 ENCOUNTER — Other Ambulatory Visit: Payer: Self-pay | Admitting: Internal Medicine

## 2017-09-19 DIAGNOSIS — Z85828 Personal history of other malignant neoplasm of skin: Secondary | ICD-10-CM | POA: Diagnosis not present

## 2017-09-19 DIAGNOSIS — L570 Actinic keratosis: Secondary | ICD-10-CM | POA: Diagnosis not present

## 2017-09-19 DIAGNOSIS — L579 Skin changes due to chronic exposure to nonionizing radiation, unspecified: Secondary | ICD-10-CM | POA: Diagnosis not present

## 2017-11-28 DIAGNOSIS — L570 Actinic keratosis: Secondary | ICD-10-CM | POA: Diagnosis not present

## 2017-11-28 DIAGNOSIS — L579 Skin changes due to chronic exposure to nonionizing radiation, unspecified: Secondary | ICD-10-CM | POA: Diagnosis not present

## 2017-12-12 ENCOUNTER — Other Ambulatory Visit: Payer: Self-pay | Admitting: Internal Medicine

## 2017-12-28 ENCOUNTER — Ambulatory Visit: Payer: Medicare Other | Admitting: Internal Medicine

## 2018-02-17 ENCOUNTER — Encounter: Payer: Self-pay | Admitting: Internal Medicine

## 2018-02-17 ENCOUNTER — Ambulatory Visit (INDEPENDENT_AMBULATORY_CARE_PROVIDER_SITE_OTHER): Payer: Medicare Other | Admitting: Internal Medicine

## 2018-02-17 VITALS — BP 140/78 | HR 69 | Temp 97.6°F | Ht 66.0 in | Wt 147.0 lb

## 2018-02-17 DIAGNOSIS — Z0000 Encounter for general adult medical examination without abnormal findings: Secondary | ICD-10-CM | POA: Insufficient documentation

## 2018-02-17 DIAGNOSIS — K529 Noninfective gastroenteritis and colitis, unspecified: Secondary | ICD-10-CM | POA: Diagnosis not present

## 2018-02-17 DIAGNOSIS — Z7189 Other specified counseling: Secondary | ICD-10-CM | POA: Diagnosis not present

## 2018-02-17 DIAGNOSIS — I10 Essential (primary) hypertension: Secondary | ICD-10-CM | POA: Diagnosis not present

## 2018-02-17 LAB — COMPREHENSIVE METABOLIC PANEL
ALK PHOS: 65 U/L (ref 39–117)
ALT: 10 U/L (ref 0–35)
AST: 16 U/L (ref 0–37)
Albumin: 4.3 g/dL (ref 3.5–5.2)
BILIRUBIN TOTAL: 0.8 mg/dL (ref 0.2–1.2)
BUN: 25 mg/dL — AB (ref 6–23)
CO2: 27 mEq/L (ref 19–32)
Calcium: 9.5 mg/dL (ref 8.4–10.5)
Chloride: 104 mEq/L (ref 96–112)
Creatinine, Ser: 1.09 mg/dL (ref 0.40–1.20)
GFR: 50.32 mL/min — ABNORMAL LOW (ref >60.00)
Glucose, Bld: 104 mg/dL — ABNORMAL HIGH (ref 70–99)
Potassium: 4.9 mEq/L (ref 3.5–5.1)
SODIUM: 140 meq/L (ref 135–145)
TOTAL PROTEIN: 7 g/dL (ref 6.0–8.3)

## 2018-02-17 LAB — CBC
HCT: 37.5 % (ref 36.0–46.0)
Hemoglobin: 12.8 g/dL (ref 12.0–15.0)
MCHC: 34.1 g/dL (ref 30.0–36.0)
MCV: 94.9 fl (ref 78.0–100.0)
Platelets: 230 10*3/uL (ref 150.0–400.0)
RBC: 3.95 Mil/uL (ref 3.87–5.11)
RDW: 13.6 % (ref 11.5–15.5)
WBC: 7.5 10*3/uL (ref 4.0–10.5)

## 2018-02-17 NOTE — Assessment & Plan Note (Signed)
I have personally reviewed the Medicare Annual Wellness questionnaire and have noted 1. The patient's medical and social history 2. Their use of alcohol, tobacco or illicit drugs 3. Their current medications and supplements 4. The patient's functional ability including ADL's, fall risks, home safety risks and hearing or visual             impairment. 5. Diet and physical activities 6. Evidence for depression or mood disorders  The patients weight, height, BMI and visual acuity have been recorded in the chart I have made referrals, counseling and provided education to the patient based review of the above and I have provided the pt with a written personalized care plan for preventive services.  I have provided you with a copy of your personalized plan for preventive services. Please take the time to review along with your updated medication list.  Yearly flu vaccine Keeps in shape No cancer screening

## 2018-02-17 NOTE — Progress Notes (Signed)
Subjective:    Patient ID: Jamie Hodges, female    DOB: 1927-09-21, 84 y.o.   MRN: [id number]  HPI Here for Medicare wellness visit and follow up of chronic health conditions Reviewed form and advanced directives Reviewed other doctors No alcohol or tobacco Still swims regularly No falls Vision is fine Hearing aides Independent with instrumental ADLs No sig memory problems  Still on BP medication No chest pain No SOB No dizziness or syncope Still some vertigo---uses the meclizine No edema  Chronic diarrhea continues Imodium up to 4 per day Only on cholestyramine daily ---- discussed increasing  Recent cold Lots of cough Feels something in her upper right back --not clear if from swimming or the cough  Current Outpatient Medications on File Prior to Visit  Medication Sig Dispense Refill  . aspirin 81 MG tablet Take 81 mg by mouth daily.      . cholestyramine light (PREVALITE) 4 g packet Take 1 packet (4 g total) by mouth 2 (two) times daily. 180 packet 3  . meclizine (ANTIVERT) 25 MG tablet TAKE 1 TABLET BY MOUTH THREE TIMES DAILY AS NEEDED FOR NAUSEA OR DIZZINESS. 60 tablet 2  . metoprolol succinate (TOPROL-XL) 100 MG 24 hr tablet TAKE 1 TABLET DAILY. TAKE WITH OR IMMEDIATELY FOLLOWING A MEAL 90 tablet 3  . ramipril (ALTACE) 10 MG capsule TAKE 1 CAPSULE DAILY 90 capsule 2   No current facility-administered medications on file prior to visit.     Allergies  Allergen Reactions  . Codeine Sulfate     REACTION: rash/ welps  . Penicillins     REACTION: rash and welps  . Crestor [Rosuvastatin Calcium]     myalgia    Past Medical History:  Diagnosis Date  . Atrial fibrillation ([place]) 2010   One time during hospital while sick with severe diarrhea  . C. difficile colitis    3/10  Severe C. dif ---had brief atrial fib then. Cath shows some blockages but no intervention  . CAD (coronary artery disease) 2010   minor blockages --no intervention indicated  . Hx of  colonic polyps   . Hyperlipidemia   . Hypertension     Past Surgical History:  Procedure Laterality Date  . CHOLECYSTECTOMY  3/16  . ERCP N/A 01/14/2015   Procedure: ENDOSCOPIC RETROGRADE CHOLANGIOPANCREATOGRAPHY (ERCP);  Surgeon: Lucilla Lame, MD;  Location: [facility] ENDOSCOPY;  Service: Endoscopy;  Laterality: N/A;  . TONSILLECTOMY AND ADENOIDECTOMY      Family History  Problem Relation Age of Onset  . COPD Mother   . Coronary artery disease Neg Hx   . Diabetes Neg Hx   . Cancer Neg Hx        breast or colon cancer    Social History   Socioeconomic History  . Marital status: Married    Spouse name: Not on file  . Number of children: 3  . Years of education: Not on file  . Highest education level: Not on file  Occupational History  . Occupation: retired Technical sales engineer  Social Needs  . Financial resource strain: Not on file  . Food insecurity:    Worry: Not on file    Inability: Not on file  . Transportation needs:    Medical: Not on file    Non-medical: Not on file  Tobacco Use  . Smoking status: Former Smoker    Last attempt to quit: 08/16/1978    Years since quitting: 39.5  . Smokeless tobacco: Never Used  Substance and  Sexual Activity  . Alcohol use: Yes    Comment: white wine  . Drug use: No  . Sexual activity: Not on file  Lifestyle  . Physical activity:    Days per week: Not on file    Minutes per session: Not on file  . Stress: Not on file  Relationships  . Social connections:    Talks on phone: Not on file    Gets together: Not on file    Attends religious service: Not on file    Active member of club or organization: Not on file    Attends meetings of clubs or organizations: Not on file    Relationship status: Not on file  . Intimate partner violence:    Fear of current or ex partner: Not on file    Emotionally abused: Not on file    Physically abused: Not on file    Forced sexual activity: Not on file  Other Topics Concern  . Not on file    Social History Narrative   Has living will.    Son Mikki Santee (MD) to make health care decisions.--then son Laurey Arrow   Has DNR order in past and requests again--done   No tube feeds if cognitively unaware         Review of Systems Appetite is fine Weight stable Not a great sleeper--uses melatonin 10mg  and this helps some 30 minute nap--feels better Wears seat belt Teeth fine--keeps up with dentist No skin problems---- sees derm yearly Bowels are fine--no blood Voids okay. Mixed incontinence---wears protection. No heartburn or dysphagia     Objective:   Physical Exam  Constitutional: She is oriented to person, place, and time. She appears well-developed. No distress.  HENT:  Mouth/Throat: Oropharynx is clear and moist. No oropharyngeal exudate.  Neck: No thyromegaly present.  Cardiovascular: Normal rate, regular rhythm, normal heart sounds and intact distal pulses. Exam reveals no gallop.  No murmur heard. Respiratory: Effort normal and breath sounds normal. No respiratory distress. She has no wheezes. She has no rales.  GI: Soft. There is no tenderness.  Musculoskeletal: She exhibits no edema or tenderness.  Lymphadenopathy:    She has no cervical adenopathy.  Neurological: She is alert and oriented to person, place, and time.  Skin: No rash noted. No erythema.  Psychiatric: She has a normal mood and affect. Her behavior is normal.           Assessment & Plan:

## 2018-02-17 NOTE — Assessment & Plan Note (Signed)
Has DNR 

## 2018-02-17 NOTE — Assessment & Plan Note (Signed)
BP Readings from Last 3 Encounters:  02/17/18 140/78  03/02/17 126/80  10/22/16 (!) 150/80   Good control Check labs

## 2018-02-17 NOTE — Assessment & Plan Note (Signed)
Asked to increase to bid with cholestyramine and try to cut back on imodium

## 2018-03-08 ENCOUNTER — Ambulatory Visit: Payer: Self-pay | Admitting: Internal Medicine

## 2018-03-08 NOTE — Telephone Encounter (Signed)
  Called in c/o right lower flank pain that started about a month ago.   She described it as a "dull ache".  The ache is becoming worse is reason she has called in today.   She mentioned this to the doctor when she saw him back on July 5th.  She needs a 30 minute appt.   I attempted to schedule her but I did not find any 30 minute appts.   I attempted to call the [facility] office several times while the pt was on hold.   There wasn't an answer.   I Skyped a message to Shirlean Mylar at the office letting her know I need a 30 minute appt could she assist with this.   I let the pt know someone would be calling her back to schedule the appt.  The pt was agreeable to this plan.   Reason for Disposition . [1] MILD pain (i.e., scale 1-3; does not interfere with normal activities) AND [2] present > 3 days  Answer Assessment - Initial Assessment Questions 1. LOCATION: "Where does it hurt?" (e.g., left, right)     Right lower back.   A dull ache. 2. ONSET: "When did the pain start?"     A month ago.   I mentioned it at my July th visit. 3. SEVERITY: "How bad is the pain?" (e.g., Scale 1-10; mild, moderate, or severe)   - MILD (1-3): doesn't interfere with normal activities    - MODERATE (4-7): interferes with normal activities or awakens from sleep    - SEVERE (8-10): excruciating pain and patient unable to do normal activities (stays in bed)       Becoming worse 4. PATTERN: "Does the pain come and go, or is it constant?"      Comes and goes. 5. CAUSE: "What do you think is causing the pain?"     I think it's a kidney problem.   I'm urinating a lot but that's normal.    6. OTHER SYMPTOMS:  "Do you have any other symptoms?" (e.g., fever, abdominal pain, vomiting, leg weakness, burning with urination, blood in urine)     None of the above 7. PREGNANCY:  "Is there any chance you are pregnant?" "When was your last menstrual period?"     N/A  Protocols used: FLANK PAIN-A-AH

## 2018-03-08 NOTE — Telephone Encounter (Signed)
Appointment 7/25 with dr tower

## 2018-03-09 ENCOUNTER — Ambulatory Visit (INDEPENDENT_AMBULATORY_CARE_PROVIDER_SITE_OTHER)
Admission: RE | Admit: 2018-03-09 | Discharge: 2018-03-09 | Disposition: A | Discharge status: Home / Self Care | Payer: Medicare Other | Source: Ambulatory Visit | Attending: Family Medicine | Admitting: Family Medicine

## 2018-03-09 ENCOUNTER — Encounter: Payer: Self-pay | Admitting: Family Medicine

## 2018-03-09 ENCOUNTER — Ambulatory Visit (INDEPENDENT_AMBULATORY_CARE_PROVIDER_SITE_OTHER): Payer: Medicare Other | Admitting: Family Medicine

## 2018-03-09 VITALS — BP 148/70 | HR 78 | Temp 97.7°F | Ht 66.0 in | Wt 147.5 lb

## 2018-03-09 DIAGNOSIS — R109 Unspecified abdominal pain: Secondary | ICD-10-CM | POA: Insufficient documentation

## 2018-03-09 DIAGNOSIS — R918 Other nonspecific abnormal finding of lung field: Secondary | ICD-10-CM | POA: Diagnosis not present

## 2018-03-09 DIAGNOSIS — R0781 Pleurodynia: Secondary | ICD-10-CM

## 2018-03-09 DIAGNOSIS — R0789 Other chest pain: Secondary | ICD-10-CM | POA: Diagnosis not present

## 2018-03-09 DIAGNOSIS — I10 Essential (primary) hypertension: Secondary | ICD-10-CM | POA: Diagnosis not present

## 2018-03-09 DIAGNOSIS — J449 Chronic obstructive pulmonary disease, unspecified: Secondary | ICD-10-CM | POA: Diagnosis not present

## 2018-03-09 LAB — POC URINALSYSI DIPSTICK (AUTOMATED)
Bilirubin, UA: NEGATIVE
Glucose, UA: NEGATIVE
Ketones, UA: NEGATIVE
Leukocytes, UA: NEGATIVE
Nitrite, UA: NEGATIVE
Protein, UA: NEGATIVE
RBC UA: NEGATIVE
SPEC GRAV UA: 1.015 (ref 1.010–1.025)
Urobilinogen, UA: 0.2 E.U./dL
pH, UA: 6 (ref 5.0–8.0)

## 2018-03-09 NOTE — Assessment & Plan Note (Signed)
With lateral rib tenderness  Neg ua  cxr and rib films today

## 2018-03-09 NOTE — Assessment & Plan Note (Signed)
Lateral rib tenderness w/o skin change or crepitus  No resp c/o  Former smoker  cxr and R rib films today  rec warm compress /analgesic prn

## 2018-03-09 NOTE — Assessment & Plan Note (Signed)
Tender in lateral ribs today (not soft tissue)  No resp symptoms  Disc poss of strain or costochondritis rec gentle heat and analgesics  Pend rad rev

## 2018-03-09 NOTE — Patient Instructions (Signed)
Since you are tender in the right ribs we will get a chest xray and also rib xrays  We will contact you when results return   Use gentle heat for 10 minutes at a time when it bothers you  Keep swimming unless it hurts

## 2018-03-09 NOTE — Assessment & Plan Note (Signed)
Blood pressure was elevated and improved on 2nd check  BP: (!) 148/70    Pt does seem mildly anxious today

## 2018-03-09 NOTE — Progress Notes (Signed)
Subjective:    Patient ID: Jamie Hodges, female    DOB: 1928/07/29, 84 y.o.   MRN: [id number]  HPI 84 yo pt of Dr Silvio Pate here with right flank pain   Wt Readings from Last 3 Encounters:  03/09/18 147 lb 8 oz (66.9 kg)  02/17/18 147 lb (66.7 kg)  03/02/17 150 lb (68 kg)   23.81 kg/m   bp is elevated today BP Readings from Last 3 Encounters:  03/09/18 (!) 160/94  02/17/18 140/78  03/02/17 126/80    Improved on re check  148/70    Former smoker years ago  No longer screens for breast cancer/declines mammograms   Pulse Readings from Last 3 Encounters:  03/09/18 78  02/17/18 69  03/02/17 79    UA is clear today  Results for orders placed or performed in visit on 03/09/18  POCT Urinalysis Dipstick (Automated)  Result Value Ref Range   Color, UA Light Yellow    Clarity, UA Clear    Glucose, UA Negative Negative   Bilirubin, UA Negative    Ketones, UA Negative    Spec Grav, UA 1.015 1.010 - 1.025   Blood, UA Negative    pH, UA 6.0 5.0 - 8.0   Protein, UA Negative Negative   Urobilinogen, UA 0.2 0.2 or 1.0 E.U./dL   Nitrite, UA Negative    Leukocytes, UA Negative Negative     Has a dull ache in R flank area  Not all the time  On and off  Sometimes it refers around to R abdomen  No n/v  No associated triggers (food/ exercise)  She swims regularly   Going on for more than a month  Is not getting worse (in fact gradually imp)   She takes motrin occasionally and it helps a little   No positional changes   She had ccy in the past  Followed by ercp for ? Retained stone  This pain does not feel similar to that   Lab Results  Component Value Date   CREATININE 1.09 02/17/2018   BUN 25 (H) 02/17/2018   NA 140 02/17/2018   K 4.9 02/17/2018   CL 104 02/17/2018   CO2 27 02/17/2018   Lab Results  Component Value Date   ALT 10 02/17/2018   AST 16 02/17/2018   ALKPHOS 65 02/17/2018   BILITOT 0.8 02/17/2018    Patient Active Problem List   Diagnosis  Date Noted  . Right flank pain 03/09/2018  . Rib pain on right side 03/09/2018  . Right-sided chest wall pain 03/09/2018  . Preventative health care 02/17/2018  . IBS (irritable bowel syndrome) 05/26/2016  . Counseling regarding advanced directives 02/27/2014  . Benign paroxysmal positional vertigo 05/11/2013  . Chronic diarrhea 06/09/2011  . Essential hypertension, benign 09/16/2009  . DIVERTICULOSIS, COLON 09/16/2009   Past Medical History:  Diagnosis Date  . Atrial fibrillation ([place]) 2010   One time during hospital while sick with severe diarrhea  . C. difficile colitis    3/10  Severe C. dif ---had brief atrial fib then. Cath shows some blockages but no intervention  . CAD (coronary artery disease) 2010   minor blockages --no intervention indicated  . Hx of colonic polyps   . Hyperlipidemia   . Hypertension    Past Surgical History:  Procedure Laterality Date  . CHOLECYSTECTOMY  3/16  . ERCP N/A 01/14/2015   Procedure: ENDOSCOPIC RETROGRADE CHOLANGIOPANCREATOGRAPHY (ERCP);  Surgeon: Lucilla Lame, MD;  Location[facility] ENDOSCOPY;  Service: Endoscopy;  Laterality: N/A;  . TONSILLECTOMY AND ADENOIDECTOMY     Social History   Tobacco Use  . Smoking status: Former Smoker    Last attempt to quit: 08/16/1978    Years since quitting: 39.5  . Smokeless tobacco: Never Used  Substance Use Topics  . Alcohol use: Yes    Comment: white wine  . Drug use: No   Family History  Problem Relation Age of Onset  . COPD Mother   . Coronary artery disease Neg Hx   . Diabetes Neg Hx   . Cancer Neg Hx        breast or colon cancer   Allergies  Allergen Reactions  . Codeine Sulfate     REACTION: rash/ welps  . Penicillins     REACTION: rash and welps  . Crestor [Rosuvastatin Calcium]     myalgia   Current Outpatient Medications on File Prior to Visit  Medication Sig Dispense Refill  . aspirin 81 MG tablet Take 81 mg by mouth daily.      . cholestyramine light (PREVALITE) 4 g packet  Take 1 packet (4 g total) by mouth 2 (two) times daily. 180 packet 3  . meclizine (ANTIVERT) 25 MG tablet TAKE 1 TABLET BY MOUTH THREE TIMES DAILY AS NEEDED FOR NAUSEA OR DIZZINESS. 60 tablet 2  . metoprolol succinate (TOPROL-XL) 100 MG 24 hr tablet TAKE 1 TABLET DAILY. TAKE WITH OR IMMEDIATELY FOLLOWING A MEAL 90 tablet 3  . ramipril (ALTACE) 10 MG capsule TAKE 1 CAPSULE DAILY 90 capsule 2   No current facility-administered medications on file prior to visit.      Review of Systems  Constitutional: Negative for activity change, appetite change, fatigue, fever and unexpected weight change.  HENT: Negative for congestion, ear pain, rhinorrhea, sinus pressure and sore throat.   Eyes: Negative for pain, redness and visual disturbance.  Respiratory: Negative for cough, chest tightness, shortness of breath and wheezing.   Cardiovascular: Negative for chest pain and palpitations.  Gastrointestinal: Negative for abdominal pain, blood in stool, constipation and diarrhea.       IBS  Endocrine: Negative for polydipsia and polyuria.  Genitourinary: Positive for flank pain. Negative for dysuria, frequency, hematuria, pelvic pain and urgency.  Musculoskeletal: Negative for arthralgias, back pain and myalgias.       Flank pain   Skin: Negative for pallor and rash.  Allergic/Immunologic: Negative for environmental allergies.  Neurological: Negative for dizziness, syncope and headaches.  Hematological: Negative for adenopathy. Does not bruise/bleed easily.  Psychiatric/Behavioral: Negative for decreased concentration and dysphoric mood. The patient is not nervous/anxious.        Objective:   Physical Exam  Constitutional: She is oriented to person, place, and time. She appears well-developed and well-nourished. No distress.  Well appearing elderly female   Eyes: Pupils are equal, round, and reactive to light. Conjunctivae and EOM are normal. No scleral icterus.  Neck: Normal range of motion. Neck  supple.  No bruits   Cardiovascular: Normal rate, regular rhythm and normal heart sounds.  Pulmonary/Chest: Effort normal and breath sounds normal. No stridor. No respiratory distress. She has no wheezes. She has no rales. She exhibits tenderness.  bs are mildly distant  Good air exch No rales /rhonchi or crackles  R sided lateral rib tenderness w/o crepitus or skin change   Abdominal: Soft. Bowel sounds are normal. She exhibits no distension and no mass. There is no tenderness. There is no guarding, no CVA tenderness and negative Murphy's sign.  Lymphadenopathy:    She has no cervical adenopathy.  Neurological: She is alert and oriented to person, place, and time. She displays tremor. No cranial nerve deficit. Coordination normal.  Skin: Skin is warm and dry. No rash noted. No erythema.  Psychiatric: Her mood appears anxious.  Mildly anxious           Assessment & Plan:   Problem List Items Addressed This Visit      Cardiovascular and Mediastinum   Essential hypertension, benign    Blood pressure was elevated and improved on 2nd check  BP: (!) 148/70    Pt does seem mildly anxious today         Other   Rib pain on right side - Primary    Lateral rib tenderness w/o skin change or crepitus  No resp c/o  Former smoker  cxr and R rib films today  rec warm compress /analgesic prn      Relevant Orders   DG Chest 2 View   DG Ribs Unilateral Right   Right flank pain    With lateral rib tenderness  Neg ua  cxr and rib films today       Relevant Orders   POCT Urinalysis Dipstick (Automated) (Completed)   DG Chest 2 View   Right-sided chest wall pain    Tender in lateral ribs today (not soft tissue)  No resp symptoms  Disc poss of strain or costochondritis rec gentle heat and analgesics  Pend rad rev      Relevant Orders   DG Chest 2 View   DG Ribs Unilateral Right

## 2018-03-10 ENCOUNTER — Other Ambulatory Visit: Payer: Self-pay | Admitting: Internal Medicine

## 2018-03-10 NOTE — Telephone Encounter (Signed)
Copied from [place] [id number]. Topic: Quick Communication - Rx Refill/Question >> Mar 10, 2018  9:51 AM Synthia Innocent wrote: Medication: cholestyramine light (PREVALITE) 4 g packet  Has the patient contacted their pharmacy? Yes.   (Agent: If no, request that the patient contact the pharmacy for the refill.) (Agent: If yes, when and what did the pharmacy advise?)  Preferred Pharmacy (with phone number or street name): [facility]  Agent: Please be advised that RX refills may take up to 3 business days. We ask that you follow-up with your pharmacy.

## 2018-03-13 MED ORDER — CHOLESTYRAMINE LIGHT 4 G PO PACK: 4.0000 g | PACK | Freq: Two times a day (BID) | ORAL | 3 refills | Status: DC

## 2018-03-13 NOTE — Telephone Encounter (Signed)
Prevalite refill  Last OV:02/17/18 Last refill:09/09/16 (expired) [facility] Pharmacy: [place], [place] [phone] (Phone) [phone] (Fax)

## 2018-03-28 ENCOUNTER — Telehealth: Payer: Self-pay | Admitting: *Deleted

## 2018-03-28 ENCOUNTER — Ambulatory Visit: Payer: Self-pay | Admitting: *Deleted

## 2018-03-28 NOTE — Telephone Encounter (Signed)
Pt reports BP yesterday 180/? States this afternoon 1600 BP 184/?  Pt is unsure of diastolic number. States she just purchased a home monitor and is on the way home. Denies any headache, weakness or dizziness. Reports taking "An extra toprol yesterday.' Instructed pt once she is home and has rested,recheck BP with home monitor and  CB if DBP >110 or any headache, weakness, dizziness occur. Pt verbalizes understanding. Pt requesting appt today (time of call 1630)  Appt secured with Glenda Chroman for tomorrow AM. Reason for Disposition . Systolic BP  >= 248 OR Diastolic >= 185  Answer Assessment - Initial Assessment Questions 1. BLOOD PRESSURE: "What is the blood pressure?" "Did you take at least two measurements 5 minutes apart?"     no 2. ONSET: "When did you take your blood pressure?"    At 1600 at [facility]  3. HOW: "How did you obtain the blood pressure?" (e.g., visiting nurse, automatic home BP monitor)     [facility]  4. HISTORY: "Do you have a history of high blood pressure?"     yes 5. MEDICATIONS: "Are you taking any medications for blood pressure?" "Have you missed any doses recently?"     Yes; took extra BP medication yesterday, Toprol XL 6. OTHER SYMPTOMS: "Do you have any symptoms?" (e.g., headache, chest pain, blurred vision, difficulty breathing, weakness)     Anxious  Protocols used: HIGH BLOOD PRESSURE-A-AH

## 2018-03-28 NOTE — Telephone Encounter (Signed)
Pt calling back after triaged earlier. States  Nurse at [facility] checked BP...190/80. States newly purchased home monitor reads 213/89. Assured pt nurses reading was accurate. Pt has appt tomorrow am; instructed pt to bring home monitor. Instructed pt to alert nurse or go to ED if severe headache, dizziness, CP, SOB present.

## 2018-03-29 ENCOUNTER — Encounter: Payer: Self-pay | Admitting: Family Medicine

## 2018-03-29 ENCOUNTER — Ambulatory Visit (INDEPENDENT_AMBULATORY_CARE_PROVIDER_SITE_OTHER): Payer: Medicare Other | Admitting: Family Medicine

## 2018-03-29 VITALS — BP 176/72 | HR 80 | Temp 97.6°F | Ht 66.0 in | Wt 149.0 lb

## 2018-03-29 DIAGNOSIS — R944 Abnormal results of kidney function studies: Secondary | ICD-10-CM

## 2018-03-29 DIAGNOSIS — I10 Essential (primary) hypertension: Secondary | ICD-10-CM | POA: Diagnosis not present

## 2018-03-29 LAB — BASIC METABOLIC PANEL
BUN: 17 mg/dL (ref 6–23)
CALCIUM: 9.7 mg/dL (ref 8.4–10.5)
CHLORIDE: 104 meq/L (ref 96–112)
CO2: 30 meq/L (ref 19–32)
Creatinine, Ser: 1.01 mg/dL (ref 0.40–1.20)
GFR: 54.93 mL/min — ABNORMAL LOW (ref >60.00)
Glucose, Bld: 127 mg/dL — ABNORMAL HIGH (ref 70–99)
Potassium: 4.6 mEq/L (ref 3.5–5.1)
SODIUM: 141 meq/L (ref 135–145)

## 2018-03-29 LAB — TSH: TSH: 2.99 u[IU]/mL (ref 0.35–4.50)

## 2018-03-29 NOTE — Progress Notes (Signed)
Subjective:    Patient ID: Jamie Hodges, female    DOB: 1928-01-15, 84 y.o.   MRN: [id number]  HPI This is an 84 yo female who presents today with elevated BP readings. Has been taking at home and her machine has readings 190-200s. New machine purchased this week. She bought because she had an elevated reading.  At [facility] yesterday, reading from RN was 190, machine over 210.  No chest pain, no unusual SOB, no headaches. No increased stressors. This has made her feel more anxious. Doesn't sleep well at baseline, takes occasional melatonin. Swims daily.   Occasionally takes Motrin for aches and pains, occasional. No other over the counter meds.   Prefers to do as little as possible, "I could die at any minute."    Past Medical History:  Diagnosis Date  . Atrial fibrillation ([place]) 2010   One time during hospital while sick with severe diarrhea  . C. difficile colitis    3/10  Severe C. dif ---had brief atrial fib then. Cath shows some blockages but no intervention  . CAD (coronary artery disease) 2010   minor blockages --no intervention indicated  . Hx of colonic polyps   . Hyperlipidemia   . Hypertension    Past Surgical History:  Procedure Laterality Date  . CHOLECYSTECTOMY  3/16  . ERCP N/A 01/14/2015   Procedure: ENDOSCOPIC RETROGRADE CHOLANGIOPANCREATOGRAPHY (ERCP);  Surgeon: Lucilla Lame, MD;  Location: [facility] ENDOSCOPY;  Service: Endoscopy;  Laterality: N/A;  . TONSILLECTOMY AND ADENOIDECTOMY     Family History  Problem Relation Age of Onset  . COPD Mother   . Coronary artery disease Neg Hx   . Diabetes Neg Hx   . Cancer Neg Hx        breast or colon cancer   Social History   Tobacco Use  . Smoking status: Former Smoker    Last attempt to quit: 08/16/1978    Years since quitting: 39.6  . Smokeless tobacco: Never Used  Substance Use Topics  . Alcohol use: Yes    Comment: white wine  . Drug use: No      Review of Systems Per HPI    Objective:   Physical  Exam  Constitutional: She is oriented to person, place, and time. She appears well-developed and well-nourished. No distress.  HENT:  Head: Normocephalic and atraumatic.  Eyes: Conjunctivae are normal.  Cardiovascular: Normal rate, regular rhythm and normal heart sounds.  Pulmonary/Chest: Effort normal and breath sounds normal.  Neurological: She is alert and oriented to person, place, and time.  Skin: Skin is warm and dry. She is not diaphoretic.  Psychiatric: She has a normal mood and affect. Her behavior is normal. Judgment and thought content normal.  Vitals reviewed.     BP (!) 192/78 (BP Location: Left Arm, Patient Position: Sitting, Cuff Size: Normal)   Pulse 80   Temp 97.6 F (36.4 C) (Oral)   Ht 5\' 6"  (1.676 m)   Wt 149 lb (67.6 kg)   SpO2 97%   BMI 24.05 kg/m  BP Readings from Last 3 Encounters:  03/29/18 (!) 192/78  03/09/18 (!) 148/70  02/17/18 140/78   BP: (!) 176/72        Assessment & Plan:  1. Essential hypertension - mildly elevated, home readings inaccurate, she was advised to return machine and not check at home - review of EMR shows fairly well controlled blood pressures over last 2 years, will have her check blood pressures through [facility]  nurse twice a week for two weeks, call with readings and will decide if need to adjust medication - reassured patient and encouraged normal activities - Basic Metabolic Panel - TSH - follow up to be determined by labs/additional blood pressure readings  2. Decreased GFR - Basic Metabolic Panel   Clarene Reamer, FNP-BC  [facility] at [facility], [place]  03/29/2018 9:04 AM

## 2018-03-29 NOTE — Patient Instructions (Addendum)
Please return your blood pressure cuff to the store, it is not effective  Have the nurse at [facility] take your blood pressure twice a week for two weeks and call in the readings to the office  I will notify you of blood work  Stick to your normal activities, let us know if you develop headaches, visual changes, shortness of breath or chest pain

## 2018-04-14 ENCOUNTER — Telehealth: Payer: Self-pay

## 2018-04-14 ENCOUNTER — Other Ambulatory Visit: Payer: Self-pay | Admitting: Family Medicine

## 2018-04-14 MED ORDER — METOPROLOL SUCCINATE ER 200 MG PO TB24: 200.0000 mg | ORAL_TABLET | Freq: Every day | ORAL | 3 refills | Status: DC

## 2018-04-14 NOTE — Telephone Encounter (Signed)
Patient stopped by the office stating that she was suppose to call and let Clarene Reamer know how her b/p readings have been but her phone is not working, so she brought in her readings on the paper. Sending readings for review to Clarene Reamer and also adding in this note. B/P:  03/28/18 190/80  03/31/18-160/82 at 5pm.  04/03/18 168/92 at 4 pm  04/06/18-168/78 at 2:45 pm  04/10/18 142/78 at 2 pm  04/13/18 150/68 Patient states it is much better, can she take a double dose of the medication if she wants to? Can she take a stronger medication? CB: [phone] and [phone] Estell Harpin, RMA

## 2018-04-14 NOTE — Telephone Encounter (Signed)
Called and spoke with patient informing her of recommendations and changes. Understanding verbalized nothing further needed at this time.

## 2018-04-14 NOTE — Telephone Encounter (Signed)
Please call patient and let her know that I want her to go up on her metoprolol to 200 mg daily. I have sent in a new prescription for the 200 mg dose, but she can take 2 of her 100 mg tablets to use up. Please follow up with Dr. Silvio Pate in 4 weeks and bring blood pressure log to the appointment.

## 2018-04-14 NOTE — Progress Notes (Signed)
Will have patient increase metoprolol xl to 200 mg daily due to elevated BP readings

## 2018-04-27 ENCOUNTER — Telehealth: Payer: Self-pay

## 2018-04-27 NOTE — Telephone Encounter (Signed)
Pt' called back, [facility] routed call to me (traige for today), I advise pt of Dr. Alla German comments and instructions and pt verbalized understanding.

## 2018-04-27 NOTE — Telephone Encounter (Signed)
Left detailed message on VM per DPR. 

## 2018-04-27 NOTE — Telephone Encounter (Signed)
Reviewed her readings---done by nurses at [facility] were 142/78 and 150/68  I think these are fine and that we should not make changes to her BP meds She should probably continue to check once every 1-2 weeks

## 2018-04-27 NOTE — Telephone Encounter (Signed)
Pt was in to see Tor Netters on 03-29-18 for elevated BP/ Debbie had asked to to follow-up in the office, but instead, pt brought in a list of her readings. Debbie asked to have Dr Silvio Pate look over them and decide what to do. Paper with her readings are in Dr Alla German Inbox on his desk.

## 2018-05-02 ENCOUNTER — Other Ambulatory Visit: Payer: Self-pay | Admitting: Internal Medicine

## 2018-06-12 DIAGNOSIS — Z23 Encounter for immunization: Secondary | ICD-10-CM | POA: Diagnosis not present

## 2018-06-23 ENCOUNTER — Other Ambulatory Visit: Payer: Self-pay

## 2018-06-23 ENCOUNTER — Observation Stay
Admission: EM | Admit: 2018-06-23 | Discharge: 2018-06-24 | Disposition: A | Discharge status: Home / Self Care | Payer: Medicare Other | Attending: Family Medicine | Admitting: Family Medicine

## 2018-06-23 ENCOUNTER — Emergency Department: Payer: Medicare Other

## 2018-06-23 DIAGNOSIS — E785 Hyperlipidemia, unspecified: Secondary | ICD-10-CM | POA: Insufficient documentation

## 2018-06-23 DIAGNOSIS — Z79899 Other long term (current) drug therapy: Secondary | ICD-10-CM | POA: Insufficient documentation

## 2018-06-23 DIAGNOSIS — Z885 Allergy status to narcotic agent status: Secondary | ICD-10-CM | POA: Diagnosis not present

## 2018-06-23 DIAGNOSIS — I672 Cerebral atherosclerosis: Secondary | ICD-10-CM | POA: Diagnosis not present

## 2018-06-23 DIAGNOSIS — I671 Cerebral aneurysm, nonruptured: Secondary | ICD-10-CM | POA: Insufficient documentation

## 2018-06-23 DIAGNOSIS — Z888 Allergy status to other drugs, medicaments and biological substances status: Secondary | ICD-10-CM | POA: Diagnosis not present

## 2018-06-23 DIAGNOSIS — R29818 Other symptoms and signs involving the nervous system: Secondary | ICD-10-CM | POA: Diagnosis not present

## 2018-06-23 DIAGNOSIS — Z87891 Personal history of nicotine dependence: Secondary | ICD-10-CM | POA: Insufficient documentation

## 2018-06-23 DIAGNOSIS — Z66 Do not resuscitate: Secondary | ICD-10-CM | POA: Diagnosis not present

## 2018-06-23 DIAGNOSIS — Z7982 Long term (current) use of aspirin: Secondary | ICD-10-CM | POA: Diagnosis not present

## 2018-06-23 DIAGNOSIS — I4891 Unspecified atrial fibrillation: Secondary | ICD-10-CM | POA: Diagnosis not present

## 2018-06-23 DIAGNOSIS — G459 Transient cerebral ischemic attack, unspecified: Secondary | ICD-10-CM

## 2018-06-23 DIAGNOSIS — H8110 Benign paroxysmal vertigo, unspecified ear: Secondary | ICD-10-CM | POA: Diagnosis not present

## 2018-06-23 DIAGNOSIS — R42 Dizziness and giddiness: Secondary | ICD-10-CM

## 2018-06-23 DIAGNOSIS — I2510 Atherosclerotic heart disease of native coronary artery without angina pectoris: Secondary | ICD-10-CM | POA: Diagnosis not present

## 2018-06-23 DIAGNOSIS — K589 Irritable bowel syndrome without diarrhea: Secondary | ICD-10-CM | POA: Diagnosis not present

## 2018-06-23 DIAGNOSIS — I10 Essential (primary) hypertension: Secondary | ICD-10-CM | POA: Diagnosis not present

## 2018-06-23 DIAGNOSIS — Z880 Allergy status to penicillin: Secondary | ICD-10-CM | POA: Insufficient documentation

## 2018-06-23 LAB — COMPREHENSIVE METABOLIC PANEL
ALBUMIN: 4 g/dL (ref 3.5–5.0)
ALK PHOS: 55 U/L (ref 38–126)
ALT: 11 U/L (ref 0–44)
AST: 22 U/L (ref 15–41)
Anion gap: 5 (ref 5–15)
BUN: 23 mg/dL (ref 8–23)
CALCIUM: 9.1 mg/dL (ref 8.9–10.3)
CO2: 26 mmol/L (ref 22–32)
CREATININE: 0.91 mg/dL (ref 0.44–1.00)
Chloride: 110 mmol/L (ref 98–111)
GFR calc non Af Amer: 55 mL/min — ABNORMAL LOW (ref >60)
GLUCOSE: 140 mg/dL — AB (ref 70–99)
Potassium: 4.5 mmol/L (ref 3.5–5.1)
SODIUM: 141 mmol/L (ref 135–145)
Total Bilirubin: 0.9 mg/dL (ref 0.3–1.2)
Total Protein: 6.9 g/dL (ref 6.5–8.1)

## 2018-06-23 LAB — DIFFERENTIAL
ABS IMMATURE GRANULOCYTES: 0.04 10*3/uL (ref 0.00–0.07)
BASOS ABS: 0.1 10*3/uL (ref 0.0–0.1)
BASOS PCT: 1 %
Eosinophils Absolute: 0.1 10*3/uL (ref 0.0–0.5)
Eosinophils Relative: 1 %
IMMATURE GRANULOCYTES: 1 %
LYMPHS PCT: 27 %
Lymphs Abs: 2.2 10*3/uL (ref 0.7–4.0)
MONO ABS: 0.4 10*3/uL (ref 0.1–1.0)
Monocytes Relative: 5 %
NEUTROS PCT: 65 %
Neutro Abs: 5.2 10*3/uL (ref 1.7–7.7)

## 2018-06-23 LAB — URINE DRUG SCREEN, QUALITATIVE (ARMC ONLY)
Amphetamines, Ur Screen: NOT DETECTED
BARBITURATES, UR SCREEN: NOT DETECTED
Benzodiazepine, Ur Scrn: POSITIVE — AB
CANNABINOID 50 NG, UR ~~LOC~~: NOT DETECTED
COCAINE METABOLITE, UR ~~LOC~~: NOT DETECTED
MDMA (Ecstasy)Ur Screen: NOT DETECTED
Methadone Scn, Ur: NOT DETECTED
OPIATE, UR SCREEN: NOT DETECTED
PHENCYCLIDINE (PCP) UR S: NOT DETECTED
TRICYCLIC, UR SCREEN: NOT DETECTED

## 2018-06-23 LAB — URINALYSIS, ROUTINE W REFLEX MICROSCOPIC
BILIRUBIN URINE: NEGATIVE
GLUCOSE, UA: NEGATIVE mg/dL
HGB URINE DIPSTICK: NEGATIVE
KETONES UR: NEGATIVE mg/dL
Leukocytes, UA: NEGATIVE
Nitrite: NEGATIVE
PH: 5 (ref 5.0–8.0)
Protein, ur: NEGATIVE mg/dL
SPECIFIC GRAVITY, URINE: 1.016 (ref 1.005–1.030)

## 2018-06-23 LAB — CBC
HCT: 38.6 % (ref 36.0–46.0)
HEMOGLOBIN: 13.4 g/dL (ref 12.0–15.0)
MCH: 33.4 pg (ref 26.0–34.0)
MCHC: 34.7 g/dL (ref 30.0–36.0)
MCV: 96.3 fL (ref 80.0–100.0)
NRBC: 0 % (ref 0.0–0.2)
Platelets: 214 10*3/uL (ref 150–400)
RBC: 4.01 MIL/uL (ref 3.87–5.11)
RDW: 13.5 % (ref 11.5–15.5)
WBC: 8.1 10*3/uL (ref 4.0–10.5)

## 2018-06-23 LAB — ETHANOL: Alcohol, Ethyl (B): <10 mg/dL (ref <10)

## 2018-06-23 LAB — HEMOGLOBIN A1C
HEMOGLOBIN A1C: 4.9 % (ref 4.8–5.6)
MEAN PLASMA GLUCOSE: 93.93 mg/dL

## 2018-06-23 LAB — TROPONIN I: Troponin I: <0.03 ng/mL (ref <0.03)

## 2018-06-23 LAB — APTT

## 2018-06-23 LAB — PROTIME-INR
INR: 0.87
Prothrombin Time: 11.8 seconds (ref 11.4–15.2)

## 2018-06-23 LAB — GLUCOSE, CAPILLARY: Glucose-Capillary: 108 mg/dL — ABNORMAL HIGH (ref 70–99)

## 2018-06-23 MED ORDER — RAMIPRIL 10 MG PO CAPS
10.0000 mg | ORAL_CAPSULE | Freq: Every day | ORAL | Status: DC
  Administered 2018-06-24: 10 mg via ORAL
  Filled 2018-06-23 (×2): qty 1

## 2018-06-23 MED ORDER — HYDRALAZINE HCL 20 MG/ML IJ SOLN
10.0000 mg | Freq: Four times a day (QID) | INTRAMUSCULAR | Status: DC | PRN
  Administered 2018-06-23: 10 mg via INTRAVENOUS
  Filled 2018-06-23: qty 1

## 2018-06-23 MED ORDER — ACETAMINOPHEN 650 MG RE SUPP: 650.0000 mg | RECTAL | Status: DC | PRN

## 2018-06-23 MED ORDER — ASPIRIN EC 81 MG PO TBEC
81.0000 mg | DELAYED_RELEASE_TABLET | Freq: Every day | ORAL | Status: DC
  Administered 2018-06-24: 81 mg via ORAL
  Filled 2018-06-23: qty 1

## 2018-06-23 MED ORDER — CHOLESTYRAMINE LIGHT 4 G PO PACK
4.0000 g | PACK | Freq: Every day | ORAL | Status: DC
  Filled 2018-06-23 (×2): qty 1

## 2018-06-23 MED ORDER — METOPROLOL SUCCINATE ER 100 MG PO TB24
200.0000 mg | ORAL_TABLET | Freq: Every day | ORAL | Status: DC
  Administered 2018-06-24: 200 mg via ORAL
  Filled 2018-06-23 (×2): qty 2

## 2018-06-23 MED ORDER — DIAZEPAM 5 MG/ML IJ SOLN
INTRAMUSCULAR | Status: AC
  Filled 2018-06-23: qty 2

## 2018-06-23 MED ORDER — STROKE: EARLY STAGES OF RECOVERY BOOK
Freq: Once | Status: AC
  Administered 2018-06-23: 19:00:00

## 2018-06-23 MED ORDER — ONDANSETRON HCL 4 MG/2ML IJ SOLN
4.0000 mg | Freq: Once | INTRAMUSCULAR | Status: AC
  Administered 2018-06-23: 4 mg via INTRAVENOUS
  Filled 2018-06-23: qty 2

## 2018-06-23 MED ORDER — ENOXAPARIN SODIUM 40 MG/0.4ML ~~LOC~~ SOLN
40.0000 mg | SUBCUTANEOUS | Status: DC
  Administered 2018-06-23: 40 mg via SUBCUTANEOUS
  Filled 2018-06-23: qty 0.4

## 2018-06-23 MED ORDER — DIAZEPAM 5 MG/ML IJ SOLN
2.0000 mg | Freq: Once | INTRAMUSCULAR | Status: AC
  Administered 2018-06-23: 2 mg via INTRAVENOUS

## 2018-06-23 MED ORDER — MECLIZINE HCL 25 MG PO TABS
25.0000 mg | ORAL_TABLET | Freq: Three times a day (TID) | ORAL | Status: DC | PRN
  Filled 2018-06-23: qty 1

## 2018-06-23 MED ORDER — ACETAMINOPHEN 160 MG/5ML PO SOLN
650.0000 mg | ORAL | Status: DC | PRN
  Filled 2018-06-23: qty 20.3

## 2018-06-23 MED ORDER — ACETAMINOPHEN 325 MG PO TABS
650.0000 mg | ORAL_TABLET | ORAL | Status: DC | PRN
  Administered 2018-06-24: 650 mg via ORAL

## 2018-06-23 MED ORDER — NICARDIPINE HCL IN NACL 20-0.86 MG/200ML-% IV SOLN
3.0000 mg/h | Freq: Once | INTRAVENOUS | Status: DC
  Filled 2018-06-23: qty 200

## 2018-06-23 NOTE — ED Notes (Signed)
Glucose 108

## 2018-06-23 NOTE — ED Provider Notes (Signed)
[facility] Emergency Department Provider Note  ____________________________________________   First MD Initiated Contact with Patient 06/23/18 1516     (approximate)  I have reviewed the triage vital signs and the nursing notes.   HISTORY  Chief Complaint Dizziness   HPI Jamie Hodges is a 84 y.o. female with a history of atrial fibrillation as well as hypertension on aspirin, metoprolol and ramipril who was presented to the emergency department today with vertigo.  States that the symptoms started approximate 130 and more minor.  However, she says the symptoms have been worsening.  She denies any associated symptoms of weakness, numbness or slurred speech.  Says that she took meclizine at home without relief.  Says that the symptoms are persistent even while at rest.  Also had nausea and vomiting at home that she says are related to the vertiginous sensation.  Says that she is compliant with her medications.  EMS found her blood pressure to be elevated to the 200s over 100s in route.  Patient resides in an independent living.  Past Medical History:  Diagnosis Date  . Atrial fibrillation ([place]) 2010   One time during hospital while sick with severe diarrhea  . C. difficile colitis    3/10  Severe C. dif ---had brief atrial fib then. Cath shows some blockages but no intervention  . CAD (coronary artery disease) 2010   minor blockages --no intervention indicated  . Hx of colonic polyps   . Hyperlipidemia   . Hypertension     Patient Active Problem List   Diagnosis Date Noted  . Right flank pain 03/09/2018  . Rib pain on right side 03/09/2018  . Right-sided chest wall pain 03/09/2018  . Preventative health care 02/17/2018  . IBS (irritable bowel syndrome) 05/26/2016  . Counseling regarding advanced directives 02/27/2014  . Benign paroxysmal positional vertigo 05/11/2013  . Chronic diarrhea 06/09/2011  . Essential hypertension, benign 09/16/2009  .  DIVERTICULOSIS, COLON 09/16/2009    Past Surgical History:  Procedure Laterality Date  . CHOLECYSTECTOMY  3/16  . ERCP N/A 01/14/2015   Procedure: ENDOSCOPIC RETROGRADE CHOLANGIOPANCREATOGRAPHY (ERCP);  Surgeon: Lucilla Lame, MD;  Location: [facility] ENDOSCOPY;  Service: Endoscopy;  Laterality: N/A;  . TONSILLECTOMY AND ADENOIDECTOMY      Prior to Admission medications   Medication Sig Start Date End Date Taking? Authorizing Provider  aspirin EC 81 MG tablet Take 81 mg by mouth daily.   Yes [provider]  cholestyramine light (PREVALITE) 4 g packet Take 1 packet (4 g total) by mouth 2 (two) times daily. Patient taking differently: Take 4 g by mouth daily. After a meal 03/13/18  Yes Viviana Simpler I, MD  EPINEPHrine 0.3 mg/0.3 mL IJ SOAJ injection Inject 0.3 mg into the muscle as needed (for allergic reaction).   Yes [provider]  meclizine (ANTIVERT) 25 MG tablet TAKE 1 TABLET BY MOUTH THREE TIMES DAILY AS NEEDED FOR NAUSEA OR DIZZINESS. Patient taking differently: Take 25 mg by mouth 3 (three) times daily as needed for dizziness.  12/12/17  Yes [name], MD  metoprolol (TOPROL-XL) 200 MG 24 hr tablet Take 1 tablet (200 mg total) by mouth daily. 04/14/18  Yes Elby Beck, FNP  ramipril (ALTACE) 10 MG capsule TAKE 1 CAPSULE DAILY Patient taking differently: Take 10 mg by mouth daily.  05/02/18  Yes [name], MD    Allergies Codeine sulfate; Crestor [rosuvastatin calcium]; and Penicillins  Family History  Problem Relation Age of Onset  .  COPD Mother   . Coronary artery disease Neg Hx   . Diabetes Neg Hx   . Cancer Neg Hx        breast or colon cancer    Social History Social History   Tobacco Use  . Smoking status: Former Smoker    Last attempt to quit: 08/16/1978    Years since quitting: 39.8  . Smokeless tobacco: Never Used  Substance Use Topics  . Alcohol use: Never    Frequency: Never  . Drug use: No    Review of  Systems  Constitutional: No fever/chills Eyes: No visual changes. ENT: No sore throat. Cardiovascular: Denies chest pain. Respiratory: Denies shortness of breath. Gastrointestinal: No abdominal pain.  No diarrhea.  No constipation. Genitourinary: Negative for dysuria. Musculoskeletal: Negative for back pain. Skin: Negative for rash. Neurological: Negative for headaches, focal weakness or numbness.   ____________________________________________   PHYSICAL EXAM:  VITAL SIGNS: ED Triage Vitals  Enc Vitals Group     BP 06/23/18 1521 (!) 227/70     Pulse --      Resp 06/23/18 1521 18     Temp --      Temp src --      SpO2 06/23/18 1523 99 %     Weight 06/23/18 1524 149 lb 0.5 oz (67.6 kg)     Height 06/23/18 1524 5\' 6"  (1.676 m)     Head Circumference --      Peak Flow --      Pain Score 06/23/18 1524 0     Pain Loc --      Pain Edu? --      Excl. in [place]? --     Constitutional: Alert and oriented.  Patient has eyes closed during exam. Eyes: Conjunctivae are normal.  Head: Atraumatic. Nose: No congestion/rhinnorhea. Mouth/Throat: Mucous membranes are moist.  Neck: No stridor.   Cardiovascular: Bradycardic, regular rhythm. Grossly normal heart sounds.   Respiratory: Normal respiratory effort.  No retractions. Lungs CTAB. Gastrointestinal: Soft and nontender. No distention.  Musculoskeletal: No lower extremity tenderness nor edema.  No joint effusions. Neurologic:  Normal speech and language.  Bidirectional nystagmus with rotational nystagmus bilaterally.  No ataxia on finger-nose or heel-to-shin testing.  No slurred speech or facial droop.  5 out of 5 strength throughout. Skin:  Skin is warm, dry and intact. No rash noted. Psychiatric: Mood and affect are normal. Speech and behavior are normal.  NIH Stroke Scale  Person Administering Scale: Doran Stabler  Administer stroke scale items in the order listed. Record performance in each category after each subscale  exam. Do not go back and change scores. Follow directions provided for each exam technique. Scores should reflect what the patient does, not what the clinician thinks the patient can do. The clinician should record answers while administering the exam and work quickly. Except where indicated, the patient should not be coached (i.e., repeated requests to patient to make a special effort).   1a  Level of consciousness: 0=alert; keenly responsive  1b. LOC questions:  0=Performs both tasks correctly  1c. LOC commands: 0=Performs both tasks correctly  2.  Best Gaze: 0=normal  3.  Visual: 0=No visual loss  4. Facial Palsy: 0=Normal symmetric movement  5a.  Motor left arm: 0=No drift, limb holds 90 (or 45) degrees for full 10 seconds  5b.  Motor right arm: 0=No drift, limb holds 90 (or 45) degrees for full 10 seconds  6a. motor left leg: 0=No drift, limb holds  90 (or 45) degrees for full 10 seconds  6b  Motor right leg:  0=No drift, limb holds 90 (or 45) degrees for full 10 seconds  7. Limb Ataxia: 0=Absent  8.  Sensory: 0=Normal; no sensory loss  9. Best Language:  0=No aphasia, normal  10. Dysarthria: 0=Normal  11. Extinction and Inattention: 0=No abnormality  12. Distal motor function: 0=Normal   Total:   0    ____________________________________________   LABS (all labs ordered are listed, but only abnormal results are displayed)  Labs Reviewed  GLUCOSE, CAPILLARY - Abnormal; Notable for the following components:      Result Value   Glucose-Capillary 108 (*)    All other components within normal limits  APTT - Abnormal; Notable for the following components:   aPTT <24 (*)    All other components within normal limits  COMPREHENSIVE METABOLIC PANEL - Abnormal; Notable for the following components:   Glucose, Bld 140 (*)    GFR calc non Af Amer 55 (*)    All other components within normal limits  PROTIME-INR  CBC  DIFFERENTIAL  TROPONIN I  ETHANOL  URINE DRUG SCREEN, QUALITATIVE  ([facility] ONLY)  URINALYSIS, ROUTINE W REFLEX MICROSCOPIC   ____________________________________________  EKG  ED ECG REPORT I, Doran Stabler, the attending physician, personally viewed and interpreted this ECG.   Date: 06/23/2018  EKG Time: 1523  Rate: 55  Rhythm: Sinus bradycardia  Axis: Normal  Intervals:none  ST&T Change: No ST segment elevation or depression.  No abnormal T wave inversion.  ____________________________________________  RADIOLOGY  CT the brain without acute pathology. ____________________________________________   PROCEDURES  Procedure(s) performed:   Procedures  Critical Care performed:   ____________________________________________   INITIAL IMPRESSION / ASSESSMENT AND PLAN / ED COURSE  Pertinent labs & imaging results that were available during my care of the patient were reviewed by me and considered in my medical decision making (see chart for details).  DDX: Uncontrolled hypertension, peripheral vertigo, central vertigo, vertebrobasilar infarct, posterior infarct, CVA As part of my medical decision making, I reviewed the following data within the [place] Notes from prior ED visits  Stroke alert called immediately after exam.  ----------------------------------------- 4:51 PM on 06/23/2018 -----------------------------------------  Patient evaluated by tele-neurology who obtained an NIH of 0.  Patient feeling much improved during the exam and TPA was not recommended.  However, neurology did recommend admission for further work-up.  I had a discussion with the patient as well as the patient's son who is a physician, Dr. Georgana Curio.  Patient and son agree that they would like to stay overnight for further diagnostic work-up and probable medical management however they are wary about intervention because of side effects/risks.  Patient is a DNR.  Had requested medication after dizziness started to return after  tele-neurology evaluation.  Patient given 2 mg of IM as well as Zofran.  Patient says that the vertigo is almost completely gone at this time.  Nystagmus has resolved.  Patient be admitted to the hospital.  Signed out to Dr. Darvin Neighbours. ____________________________________________   FINAL CLINICAL IMPRESSION(S) / ED DIAGNOSES  Vertigo.  NEW MEDICATIONS STARTED DURING THIS VISIT:  New Prescriptions   No medications on file     Note:  This document was prepared using Dragon voice recognition software and may include unintentional dictation errors.     Orbie Pyo, MD 06/23/18 [phone]

## 2018-06-23 NOTE — ED Notes (Signed)
Tele neuro nurse, Izell Orovada responded at [phone].

## 2018-06-23 NOTE — ED Notes (Signed)
Called code stroke called to [phone]

## 2018-06-23 NOTE — H&P (Signed)
[place] [facility] [place] NAME: Jamie Hodges    MR#:  [id number]  DATE OF BIRTH:  February 05, 1928  DATE OF ADMISSION:  06/23/2018  PRIMARY CARE PHYSICIAN: [name], MD   REQUESTING/REFERRING PHYSICIAN: Dr. Clearnce Hasten  CHIEF COMPLAINT:   Chief Complaint  Patient presents with  . Dizziness    HISTORY OF PRESENT ILLNESS:  Jamie Hodges  is a 84 y.o. female with a known history of atrial fibrillation on aspirin, hypertension, history of vertigo presents to the emergency room complaining of acute onset of dizziness, nausea and vertigo-like symptoms at 1:30 PM.  Patient has had similar symptoms in the past but this was the worst she has had.  Continue to happen even at rest without change in position.  Did not have any focal numbness or weakness.  No dysphagia or change in vision.  No loss of hearing.  No change in speech.  Patient presented to the emergency room and had a CT scan of the head which was negative.  Code stroke was called.  Seen by telemetry neurology.  Advised admission and stroke work-up.  Patient symptoms are improved but still present. Blood pressure has been significantly elevated in the 200s.  Patient has a small pocket card with recent blood pressures from her independent living facility and blood pressure has been trending anywhere between 1 78-2 55 systolic.  PAST MEDICAL HISTORY:   Past Medical History:  Diagnosis Date  . Atrial fibrillation ([place]) 2010   One time during hospital while sick with severe diarrhea  . C. difficile colitis    3/10  Severe C. dif ---had brief atrial fib then. Cath shows some blockages but no intervention  . CAD (coronary artery disease) 2010   minor blockages --no intervention indicated  . Hx of colonic polyps   . Hyperlipidemia   . Hypertension     PAST SURGICAL HISTORY:   Past Surgical History:  Procedure Laterality Date  . CHOLECYSTECTOMY  3/16  . ERCP N/A 01/14/2015   Procedure: ENDOSCOPIC  RETROGRADE CHOLANGIOPANCREATOGRAPHY (ERCP);  Surgeon: Lucilla Lame, MD;  Location: [facility] ENDOSCOPY;  Service: Endoscopy;  Laterality: N/A;  . TONSILLECTOMY AND ADENOIDECTOMY      SOCIAL HISTORY:   Social History   Tobacco Use  . Smoking status: Former Smoker    Last attempt to quit: 08/16/1978    Years since quitting: 39.8  . Smokeless tobacco: Never Used  Substance Use Topics  . Alcohol use: Never    Frequency: Never    FAMILY HISTORY:   Family History  Problem Relation Age of Onset  . COPD Mother   . Coronary artery disease Neg Hx   . Diabetes Neg Hx   . Cancer Neg Hx        breast or colon cancer    DRUG ALLERGIES:   Allergies  Allergen Reactions  . Codeine Sulfate     REACTION: rash/ welps  . Crestor [Rosuvastatin Calcium]     myalgia  . Penicillins Hives and Rash    Has patient had a PCN reaction causing immediate rash, facial/tongue/throat swelling, SOB or lightheadedness with hypotension: Yes Has patient had a PCN reaction causing severe rash involving mucus membranes or skin necrosis: No Has patient had a PCN reaction that required hospitalization: No Has patient had a PCN reaction occurring within the last 10 years: Yes If all of the above answers are "NO", then may proceed with Cephalosporin use.    REVIEW OF SYSTEMS:  Review of Systems  Constitutional: Positive for malaise/fatigue. Negative for chills and fever.  HENT: Negative for sore throat.   Eyes: Negative for blurred vision, double vision and pain.  Respiratory: Negative for cough, hemoptysis, shortness of breath and wheezing.   Cardiovascular: Negative for chest pain, palpitations, orthopnea and leg swelling.  Gastrointestinal: Negative for abdominal pain, constipation, diarrhea, heartburn, nausea and vomiting.  Genitourinary: Negative for dysuria and hematuria.  Musculoskeletal: Negative for back pain and joint pain.  Skin: Negative for rash.  Neurological: Positive for dizziness. Negative for  sensory change, speech change, focal weakness and headaches.  Endo/Heme/Allergies: Does not bruise/bleed easily.  Psychiatric/Behavioral: Negative for depression. The patient is not nervous/anxious.     MEDICATIONS AT HOME:   Prior to Admission medications   Medication Sig Start Date End Date Taking? Authorizing Provider  aspirin EC 81 MG tablet Take 81 mg by mouth daily.   Yes [provider]  cholestyramine light (PREVALITE) 4 g packet Take 1 packet (4 g total) by mouth 2 (two) times daily. Patient taking differently: Take 4 g by mouth daily. After a meal 03/13/18  Yes Viviana Simpler I, MD  EPINEPHrine 0.3 mg/0.3 mL IJ SOAJ injection Inject 0.3 mg into the muscle as needed (for allergic reaction).   Yes [provider]  meclizine (ANTIVERT) 25 MG tablet TAKE 1 TABLET BY MOUTH THREE TIMES DAILY AS NEEDED FOR NAUSEA OR DIZZINESS. Patient taking differently: Take 25 mg by mouth 3 (three) times daily as needed for dizziness.  12/12/17  Yes [name], MD  metoprolol (TOPROL-XL) 200 MG 24 hr tablet Take 1 tablet (200 mg total) by mouth daily. 04/14/18  Yes Elby Beck, FNP  ramipril (ALTACE) 10 MG capsule TAKE 1 CAPSULE DAILY Patient taking differently: Take 10 mg by mouth daily.  05/02/18  Yes [name], MD     VITAL SIGNS:  Blood pressure (!) 165/106, pulse 65, resp. rate 17, height 5\' 6"  (1.676 m), weight 67.6 kg, SpO2 98 %.  PHYSICAL EXAMINATION:  Physical Exam  GENERAL:  84 y.o.-year-old patient lying in the bed with no acute distress.  EYES: Pupils equal, round, reactive to light and accommodation. No scleral icterus. Extraocular muscles intact.  HEENT: Head atraumatic, normocephalic. Oropharynx and nasopharynx clear. No oropharyngeal erythema, moist oral mucosa  NECK:  Supple, no jugular venous distention. No thyroid enlargement, no tenderness.  LUNGS: Normal breath sounds bilaterally, no wheezing, rales, rhonchi. No use of accessory muscles of  respiration.  CARDIOVASCULAR: S1, S2 normal. No murmurs, rubs, or gallops.  ABDOMEN: Soft, nontender, nondistended. Bowel sounds present. No organomegaly or mass.  EXTREMITIES: No pedal edema, cyanosis, or clubbing. + 2 pedal & radial pulses b/l.   NEUROLOGIC: Cranial nerves II through XII are intact. No focal Motor or sensory deficits appreciated b/l PSYCHIATRIC: The patient is alert and oriented x 3. Good affect.  SKIN: No obvious rash, lesion, or ulcer.   LABORATORY PANEL:   CBC Recent Labs  Lab 06/23/18 1523  WBC 8.1  HGB 13.4  HCT 38.6  PLT 214   ------------------------------------------------------------------------------------------------------------------  Chemistries  Recent Labs  Lab 06/23/18 1523  NA 141  K 4.5  CL 110  CO2 26  GLUCOSE 140*  BUN 23  CREATININE 0.91  CALCIUM 9.1  AST 22  ALT 11  ALKPHOS 55  BILITOT 0.9   ------------------------------------------------------------------------------------------------------------------  Cardiac Enzymes Recent Labs  Lab 06/23/18 1523  TROPONINI <0.03   ------------------------------------------------------------------------------------------------------------------  RADIOLOGY:  Ct Head Code Stroke Wo  Contrast  Result Date: 06/23/2018 CLINICAL DATA:  Code stroke.  Vertigo nausea vomiting EXAM: CT HEAD WITHOUT CONTRAST TECHNIQUE: Contiguous axial images were obtained from the base of the skull through the vertex without intravenous contrast. COMPARISON:  CT head 04/24/2013 FINDINGS: Brain: Moderate atrophy. Moderate chronic microvascular ischemic change in the white matter. Negative for acute infarct, hemorrhage, mass. CSF density cyst lateral to the left frontal lobe is unchanged measuring 5.4 x 2.4 cm, consistent with arachnoid cyst. Vascular: Negative for hyperdense vessel Skull: Negative Sinuses/Orbits: Paranasal sinuses clear.  No orbital mass. Other: None ASPECTS ([place] Stroke Program Early CT Score) -  Ganglionic level infarction (caudate, lentiform nuclei, internal capsule, insula, M1-M3 cortex): 7 - Supraganglionic infarction (M4-M6 cortex): 3 Total score (0-10 with 10 being normal): 10 IMPRESSION: 1. No acute intracranial abnormality 2. ASPECTS is 10 3. These results were called by telephone at the time of interpretation on 06/23/2018 at 3:46 pm to Dr. Larae Grooms , who verbally acknowledged these results. Electronically Signed   By: Franchot Gallo M.D.   On: 06/23/2018 15:47   IMPRESSION AND PLAN:   *Acute dizziness.  Likely vertigo.  Will admit under observation for stroke work-up as advised by neurology.  Check MRI and MRA of the brain.  Echocardiogram and carotid Dopplers.  Aspirin.  Check fasting lipid profile.  Statin.  Neurochecks.  Permissive hypertension. Consult neurology.  PT and OT consult. Meclizine as needed  *Hypertension, uncontrolled.  Will continue home medications.  Use IV hydralazine if systolic blood pressure more than 220.  *Atrial fibrillation.  Continue Toprol and aspirin. Will wait for neurology input regarding patient's symptoms.  If this is TIA or stroke patient needs to be on Eliquis.  Monitor  *DVT prophylaxis with Lovenox  All the records are reviewed and case discussed with ED provider. Management plans discussed with the patient, family and they are in agreement.  CODE STATUS: DO NOT RESUSCITATE  TOTAL TIME TAKING CARE OF THIS PATIENT: 35 minutes.   Neita Carp M.D on 06/23/2018 at 5:28 PM  Between 7am to 6pm - Pager - [phone]  After 6pm go to [email or web address] - password [email or web address] [facility]  Office  [phone]  CC: Primary care physician; [name], MD  Note: This dictation was prepared with Dragon dictation along with smaller phrase technology. Any transcriptional errors that result from this process are unintentional.

## 2018-06-23 NOTE — Progress Notes (Addendum)
Report from Janett Billow , ED RN. Per report patient has had the swallow screen done and passed.

## 2018-06-23 NOTE — Consult Note (Signed)
[facility] Tele[facility]rology Consult Services   Date of Service:   06/23/2018 15:42:01  Impression:     .  RO Acute Ischemic Stroke  Comments: 84 year old female with vertigo. Likely this is secondary to her peripheral vertigo but given change in severity from baseline posterior circulation infarct is possible.  Mechanism of Stroke: Not Clear  Metrics: Last Known Well: 06/23/2018 13:30:00 [facility] Notification Time: 06/23/2018 15:42:01 Arrival Time: 06/23/2018 15:11:00 Stamp Time: 06/23/2018 15:42:01 Time First Login Attempt: 06/23/2018 15:46:00 Video Start Time: 06/23/2018 15:46:00  Symptoms: Vertigo NIHSS Start Assessment Time: 06/23/2018 15:50:00 Patient is not a candidate for tPA. Patient was not deemed candidate for tPA thrombolytics because of NIHSS of 0 . Video End Time: 06/23/2018 15:59:14  CT head was reviewed.  Advanced imaging was not obtained as the presentation was not suggestive of Large Vessel Occlusive Disease.   Radiologist was not called back for review of advanced imaging because Symptoms not consistent with LVO ER Physician notified of the decision on thrombolytics management on 06/23/2018 15:59:13  Our recommendations are outlined below.  Recommendations:     .  Activate Stroke Protocol Admission/Order Set     .  Stroke/Telemetry Floor     .  Neuro Checks     .  Bedside Swallow Eval     .  DVT Prophylaxis     .  IV Fluids, Normal Saline     .  Head of Bed Below 30 Degrees     .  Euglycemia and Avoid Hyperthermia (PRN Acetaminophen)     .  Antiplatelet Therapy Recommended  Routine Consultation with [facility] for Follow up Care  Sign Out:     .  Discussed with Emergency Department Provider    ------------------------------------------------------------------------------  History of Present Illness: Patient is a 84 year old Female.  Patient was brought by EMS for symptoms of Vertigo  84 year old female  with a history of vertigo and hypertension who presented to the hospital because of acute onset of vertigo with vomiting and fecal incontinence. This episode of vertigo is the worst she has ever had. She reports that in the past week her vertigo has been especially bad.  CT head was reviewed.    Examination: 1A: Level of Consciousness - Alert; keenly responsive + 0 1B: Ask Month and Age - Both Questions Right + 0 1C: Blink Eyes & Squeeze Hands - Performs Both Tasks + 0 2: Test Horizontal Extraocular Movements - Normal + 0 3: Test Visual Fields - No Visual Loss + 0 4: Test Facial Palsy (Use Grimace if Obtunded) - Normal symmetry + 0 5A: Test Left Arm Motor Drift - No Drift for 10 Seconds + 0 5B: Test Right Arm Motor Drift - No Drift for 10 Seconds + 0 6A: Test Left Leg Motor Drift - No Drift for 5 Seconds + 0 6B: Test Right Leg Motor Drift - No Drift for 5 Seconds + 0 7: Test Limb Ataxia (FNF/Heel-Shin) - No Ataxia + 0 8: Test Sensation - Normal; No sensory loss + 0 9: Test Language/Aphasia - Normal; No aphasia + 0 10: Test Dysarthria - Normal + 0 11: Test Extinction/Inattention - No abnormality + 0  NIHSS Score: 0  Patient was informed the Neurology Consult would happen via TeleHealth consult by way of interactive audio and video telecommunications and consented to receiving care in this manner.  Due to the immediate potential for life-threatening deterioration due to underlying acute neurologic illness, I spent 35  minutes providing critical care. This time includes time for face to face visit via telemedicine, review of medical records, imaging studies and discussion of findings with providers, the patient and/or family.   Dr Tsosie Billing   [facility] [phone]

## 2018-06-23 NOTE — ED Triage Notes (Signed)
Pt arrives from [facility] via [facility] c/o vertigo, [place]. Pt states "this feels just like a vertigo spell" Protocol initiated.

## 2018-06-23 NOTE — Progress Notes (Signed)
   06/23/18 1535  Clinical Encounter Type  Visited With Patient not available  Visit Type Initial;Spiritual support;ED  Referral From Nurse  Consult/Referral To Chaplain  Spiritual Encounters  Spiritual Needs Prayer;Other (Comment)   [place] received an code stroke page. I reported to the patient's room but she was not available due to Code [facility] evaluation. I will alert the on-coming chaplain to follow up as needed.

## 2018-06-23 NOTE — Progress Notes (Signed)
Advance care planning  Purpose of Encounter Dizziness, CODE STATUS  Parties in Attendance Patient  Patients Decisional capacity Patient is alert and oriented.  Able to make medical decisions.  Patient has documented healthcare power of attorney and living will.  Discussed with patient regarding dizziness, need for admission to the hospital for work-up of stroke.  All questions answered  Patient has a documented do not resuscitate DO NOT INTUBATE status.  Orders changed in the computer.  Time spent - 17 minutes

## 2018-06-24 ENCOUNTER — Observation Stay: Payer: Medicare Other

## 2018-06-24 ENCOUNTER — Observation Stay: Admit: 2018-06-24 | Payer: Medicare Other

## 2018-06-24 DIAGNOSIS — H8110 Benign paroxysmal vertigo, unspecified ear: Secondary | ICD-10-CM | POA: Diagnosis not present

## 2018-06-24 DIAGNOSIS — I4891 Unspecified atrial fibrillation: Secondary | ICD-10-CM | POA: Diagnosis not present

## 2018-06-24 DIAGNOSIS — R42 Dizziness and giddiness: Secondary | ICD-10-CM | POA: Diagnosis not present

## 2018-06-24 DIAGNOSIS — G459 Transient cerebral ischemic attack, unspecified: Secondary | ICD-10-CM | POA: Diagnosis not present

## 2018-06-24 DIAGNOSIS — I10 Essential (primary) hypertension: Secondary | ICD-10-CM | POA: Diagnosis not present

## 2018-06-24 DIAGNOSIS — R270 Ataxia, unspecified: Secondary | ICD-10-CM | POA: Diagnosis not present

## 2018-06-24 DIAGNOSIS — I6523 Occlusion and stenosis of bilateral carotid arteries: Secondary | ICD-10-CM | POA: Diagnosis not present

## 2018-06-24 LAB — LIPID PANEL
CHOL/HDL RATIO: 3.6 ratio
CHOLESTEROL: 185 mg/dL (ref 0–200)
HDL: 51 mg/dL (ref >40)
LDL CALC: 92 mg/dL (ref 0–99)
Triglycerides: 208 mg/dL — ABNORMAL HIGH (ref <150)
VLDL: 42 mg/dL — AB (ref 0–40)

## 2018-06-24 MED ORDER — AMLODIPINE BESYLATE 2.5 MG PO TABS: 2.5000 mg | ORAL_TABLET | Freq: Every day | ORAL | 0 refills | Status: DC

## 2018-06-24 NOTE — Progress Notes (Signed)
OT Cancellation Note  Patient Details Name: Jamie Hodges MRN: [id number] DOB: June 14, 1928   Cancelled Treatment:    Reason Eval/Treat Not Completed: OT screened, no needs identified, will sign off  Pt report she was up to bathroom - dizziness fine- no LOB and MRI negative per nsg - recommendations was to follow up with ENT   Rosalyn Gess OTR/L,CLT 06/24/2018, 10:22 AM

## 2018-06-24 NOTE — Plan of Care (Signed)
  Problem: Education: Goal: Knowledge of General Education information will improve Description Including pain rating scale, medication(s)/side effects and non-pharmacologic comfort measures Outcome: Progressing   Problem: Health Behavior/Discharge Planning: Goal: Ability to manage health-related needs will improve Outcome: Progressing   Problem: Safety: Goal: Ability to remain free from injury will improve Outcome: Progressing   Problem: Education: Goal: Knowledge of disease or condition will improve Outcome: Progressing

## 2018-06-24 NOTE — Discharge Summary (Signed)
Jamie Hodges at [place] NAME: Jamie Hodges    MR#:  [id number]  DATE OF BIRTH:  1928/01/14  DATE OF ADMISSION:  06/23/2018 ADMITTING PHYSICIAN: Hillary Bow, MD  DATE OF DISCHARGE: No discharge date for patient encounter.  PRIMARY CARE PHYSICIAN: [name], MD    ADMISSION DIAGNOSIS:  TIA (transient ischemic attack) [G45.9] Vertigo [R42] Hypertension, unspecified type [I10]  DISCHARGE DIAGNOSIS:  Active Problems:   Vertigo   SECONDARY DIAGNOSIS:   Past Medical History:  Diagnosis Date  . Atrial fibrillation ([place]) 2010   One time during hospital while sick with severe diarrhea  . C. difficile colitis    3/10  Severe C. dif ---had brief atrial fib then. Cath shows some blockages but no intervention  . CAD (coronary artery disease) 2010   minor blockages --no intervention indicated  . Hx of colonic polyps   . Hyperlipidemia   . Hypertension     HOSPITAL COURSE:    *Acute dizziness Noted history of vertigo Resolved -suspect related to uncontrolled/accelerated hypertension Referred to the observation unit, MRI of the brain noted for multiple findings-discussed with the patient as well as the patient's son-no aggressive intervention recommended/denied any need for follow-up with neurosurgery, treated with meclizine, will have patient follow-up with ENT status post discharge for reevaluation in 1 to 2 weeks, physical therapy did see patient while in house-no needs identified  *Acute accelerated hypertension  Resolved on current regiment   *Atrial fibrillation Con controlled on Toprol and aspirin  DISCHARGE CONDITIONS:   stable  CONSULTS OBTAINED:  Treatment Team:  Leotis Pain, MD  DRUG ALLERGIES:   Allergies  Allergen Reactions  . Codeine Sulfate     REACTION: rash/ welps  . Crestor [Rosuvastatin Calcium]     myalgia  . Penicillins Hives and Rash    Has patient had a PCN reaction causing  immediate rash, facial/tongue/throat swelling, SOB or lightheadedness with hypotension: Yes Has patient had a PCN reaction causing severe rash involving mucus membranes or skin necrosis: No Has patient had a PCN reaction that required hospitalization: No Has patient had a PCN reaction occurring within the last 10 years: Yes If all of the above answers are "NO", then may proceed with Cephalosporin use.    DISCHARGE MEDICATIONS:   Allergies as of 06/24/2018      Reactions   Codeine Sulfate    REACTION: rash/ welps   Crestor [rosuvastatin Calcium]    myalgia   Penicillins Hives, Rash   Has patient had a PCN reaction causing immediate rash, facial/tongue/throat swelling, SOB or lightheadedness with hypotension: Yes Has patient had a PCN reaction causing severe rash involving mucus membranes or skin necrosis: No Has patient had a PCN reaction that required hospitalization: No Has patient had a PCN reaction occurring within the last 10 years: Yes If all of the above answers are "NO", then may proceed with Cephalosporin use.      Medication List    TAKE these medications   amLODipine 2.5 MG tablet Commonly known as:  NORVASC Take 1 tablet (2.5 mg total) by mouth at bedtime.   aspirin EC 81 MG tablet Take 81 mg by mouth daily.   cholestyramine light 4 g packet Commonly known as:  PREVALITE Take 1 packet (4 g total) by mouth 2 (two) times daily. What changed:    when to take this  additional instructions   EPINEPHrine 0.3 mg/0.3 mL Soaj injection Commonly known as:  EPI-PEN Inject  0.3 mg into the muscle as needed (for allergic reaction).   meclizine 25 MG tablet Commonly known as:  ANTIVERT TAKE 1 TABLET BY MOUTH THREE TIMES DAILY AS NEEDED FOR NAUSEA OR DIZZINESS. What changed:  See the new instructions.   metoprolol 200 MG 24 hr tablet Commonly known as:  TOPROL-XL Take 1 tablet (200 mg total) by mouth daily.   ramipril 10 MG capsule Commonly known as:  ALTACE TAKE 1  CAPSULE DAILY        DISCHARGE INSTRUCTIONS:  If you experience worsening of your admission symptoms, develop shortness of breath, life threatening emergency, suicidal or homicidal thoughts you must seek medical attention immediately by calling 911 or calling your MD immediately  if symptoms less severe.  You Must read complete instructions/literature along with all the possible adverse reactions/side effects for all the Medicines you take and that have been prescribed to you. Take any new Medicines after you have completely understood and accept all the possible adverse reactions/side effects.   Please note  You were cared for by a hospitalist during your hospital stay. If you have any questions about your discharge medications or the care you received while you were in the hospital after you are discharged, you can call the unit and asked to speak with the hospitalist on call if the hospitalist that took care of you is not available. Once you are discharged, your primary care physician will handle any further medical issues. Please note that NO REFILLS for any discharge medications will be authorized once you are discharged, as it is imperative that you return to your primary care physician (or establish a relationship with a primary care physician if you do not have one) for your aftercare needs so that they can reassess your need for medications and monitor your lab values.    Today   CHIEF COMPLAINT:   Chief Complaint  Patient presents with  . Dizziness    HISTORY OF PRESENT ILLNESS:  84 y.o. female with a known history of atrial fibrillation on aspirin, hypertension, history of vertigo presents to the emergency room complaining of acute onset of dizziness, nausea and vertigo-like symptoms at 1:30 PM.  Patient has had similar symptoms in the past but this was the worst she has had.  Continue to happen even at rest without change in position.  Did not have any focal numbness or  weakness.  No dysphagia or change in vision.  No loss of hearing.  No change in speech.  Patient presented to the emergency room and had a CT scan of the head which was negative.  Code stroke was called.  Seen by telemetry neurology.  Advised admission and stroke work-up.  Patient symptoms are improved but still present. Blood pressure has been significantly elevated in the 200s.  Patient has a small pocket card with recent blood pressures from her independent living facility and blood pressure has been trending anywhere between 1 66-0 55 systolic. VITAL SIGNS:  Blood pressure (!) 141/54, pulse 80, temperature 98.6 F (37 C), temperature source Oral, resp. rate 16, height 5\' 7"  (1.702 m), weight 64.7 kg, SpO2 96 %.  I/O:    Intake/Output Summary (Last 24 hours) at 06/24/2018 1116 Last data filed at 06/24/2018 1025 Gross per 24 hour  Intake 240 ml  Output 450 ml  Net -210 ml    PHYSICAL EXAMINATION:  GENERAL:  84 y.o.-year-old patient lying in the bed with no acute distress.  EYES: Pupils equal, round, reactive to light  and accommodation. No scleral icterus. Extraocular muscles intact.  HEENT: Head atraumatic, normocephalic. Oropharynx and nasopharynx clear.  NECK:  Supple, no jugular venous distention. No thyroid enlargement, no tenderness.  LUNGS: Normal breath sounds bilaterally, no wheezing, rales,rhonchi or crepitation. No use of accessory muscles of respiration.  CARDIOVASCULAR: S1, S2 normal. No murmurs, rubs, or gallops.  ABDOMEN: Soft, non-tender, non-distended. Bowel sounds present. No organomegaly or mass.  EXTREMITIES: No pedal edema, cyanosis, or clubbing.  NEUROLOGIC: Cranial nerves II through XII are intact. Muscle strength 5/5 in all extremities. Sensation intact. Gait not checked.  PSYCHIATRIC: The patient is alert and oriented x 3.  SKIN: No obvious rash, lesion, or ulcer.   DATA REVIEW:   CBC Recent Labs  Lab 06/23/18 1523  WBC 8.1  HGB 13.4  HCT 38.6  PLT 214     Chemistries  Recent Labs  Lab 06/23/18 1523  NA 141  K 4.5  CL 110  CO2 26  GLUCOSE 140*  BUN 23  CREATININE 0.91  CALCIUM 9.1  AST 22  ALT 11  ALKPHOS 55  BILITOT 0.9    Cardiac Enzymes Recent Labs  Lab 06/23/18 1523  [place] <0.03    Microbiology Results  Results for orders placed or performed in visit on 12/08/15  Clostridium Difficile by PCR     Status: None   Collection Time: 12/09/15  9:18 AM  Result Value Ref Range Status   Toxigenic C. Difficile by PCR Not Detected Not Detected Final    Comment: This test is for use only with liquid or soft stools; performance characteristics of other clinical specimen types have not been established.   This assay was performed by Cepheid GeneXpert(R) PCR. The performance characteristics of this assay have been determined by [employer]. Performance characteristics refer to the analytical performance of the test.     RADIOLOGY:  Mr Brain Wo Contrast  Result Date: 06/24/2018 CLINICAL DATA:  Initial evaluation for acute ataxia, stroke suspected. EXAM: MRI HEAD WITHOUT CONTRAST MRA HEAD WITHOUT CONTRAST TECHNIQUE: Multiplanar, multiecho pulse sequences of the brain and surrounding structures were obtained without intravenous contrast. Angiographic images of the head were obtained using MRA technique without contrast. COMPARISON:  Prior CT from 06/23/2018 FINDINGS: MRI HEAD FINDINGS Brain: Generalized age-related cerebral atrophy. Confluent T2/FLAIR hyperintensity within the periventricular and deep white matter both cerebral hemispheres most consistent with chronic small vessel ischemic disease, mild to moderate nature. No abnormal foci of restricted diffusion to suggest acute or subacute ischemia. Gray-white matter differentiation maintained. No areas of remote cortical infarction. No foci of susceptibility artifact to suggest acute or chronic intracranial hemorrhage. 5.5 cm benign arachnoid cyst overlies the left  frontal convexity. No other mass lesion or mass effect. No midline shift. No hydrocephalus. No other extra-axial fluid collection. Pituitary gland within normal limits. Vascular: Major intracranial vascular flow voids maintained. Skull and upper cervical spine: Craniocervical junction within normal limits. Grade 1 anterolisthesis of C4 on C5 noted, likely chronic and degenerative in nature. Remainder the visualized upper cervical spine otherwise within normal limits. Bone marrow signal intensity normal. No scalp soft tissue abnormality. Sinuses/Orbits: Patient status post bilateral ocular lens replacement. Paranasal sinuses and mastoid air cells are clear. Inner ear structures normal. Other: None. MRA HEAD FINDINGS ANTERIOR CIRCULATION: Distal cervical segments of the internal carotid arteries are patent with antegrade flow. Petrous segments widely patent bilaterally. Scattered atheromatous irregularity throughout the cavernous/supraclinoid ICAs with associated mild to moderate multifocal narrowing, right slightly worse than left. ICA termini  widely patent bilaterally. Short-segment mild left A1 stenosis. A1 segments otherwise widely patent. Normal anterior communicating artery. ACAs widely patent to their distal aspects without high-grade stenosis. Diffuse atheromatous irregularity within the M1 segments bilaterally without high-grade stenosis. MCA bifurcations within normal limits. There is a severe near occlusive proximal left M2 stenosis measuring 5 mm in length, superior division (series 10, image 128). No proximal right M2 stenosis or occlusion. Moderate diffuse distal small vessel atheromatous irregularity throughout the MCA branches bilaterally. POSTERIOR CIRCULATION: Vertebral arteries widely patent to the vertebrobasilar junction without flow-limiting stenosis. Posterior inferior cerebral arteries patent bilaterally. Basilar patent to its distal aspect without high-grade stenosis. Superior cerebral  arteries patent bilaterally. Both of the PCAs supplied via the basilar artery. Moderate atheromatous irregularity throughout the PCAs bilaterally with multifocal moderate bilateral P2 stenoses. PCAs are patent to their distal aspects. 3 mm focal outpouching arising from the cavernous right ICA suspicious for small aneurysm (series 10, image 109). This is directed medially and posteriorly. IMPRESSION: MRI HEAD IMPRESSION: 1. No acute intracranial infarct or other abnormality identified. 2. Age-related cerebral atrophy with mild to moderate chronic small vessel ischemic disease. 3. 5.5 cm benign arachnoid cyst overlying the left frontal convexity. MRA HEAD IMPRESSION: 1. Negative intracranial MRA for large vessel occlusion. 5 mm severe near occlusive proximal left M2 stenosis, superior division. 2. Additional moderate intracranial atherosclerotic change involving the anterior and posterior circulations as above. No other proximal high-grade or correctable stenosis identified. 3. 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 Electronically Signed   By: Jeannine Boga M.D.   On: 06/24/2018 03:00   US Carotid Bilateral (at [facility] Only)  Result Date: 06/24/2018 CLINICAL DATA:  84 year old female with symptoms of transient ischemic attack EXAM: BILATERAL CAROTID DUPLEX ULTRASOUND TECHNIQUE: Pearline Cables scale imaging, color Doppler and duplex ultrasound were performed of bilateral carotid and vertebral arteries in the neck. COMPARISON:  Brain MRI 06/24/2018 FINDINGS: Criteria: Quantification of carotid stenosis is based on velocity parameters that correlate the residual internal carotid diameter with NASCET-based stenosis levels, using the diameter of the distal internal carotid lumen as the denominator for stenosis measurement. The following velocity measurements were obtained: RIGHT ICA: 145/9 cm/sec CCA: 546/50 cm/sec SYSTOLIC ICA/CCA RATIO:  1.3 ECA:  162 cm/sec LEFT ICA: 149/18 cm/sec CCA: 354/65 cm/sec  SYSTOLIC ICA/CCA RATIO:  1.3 ECA:  161 cm/sec RIGHT CAROTID ARTERY: Mild heterogeneous atherosclerotic plaque in the proximal internal carotid artery. By peak systolic velocity criteria, the estimated stenosis falls in the 50-69% range. RIGHT VERTEBRAL ARTERY:  Patent with antegrade flow. LEFT CAROTID ARTERY: Heterogeneous atherosclerotic plaque in the distal common carotid artery extending into the proximal internal carotid artery. By peak systolic velocity criteria, the estimated stenosis falls in the 50-69% diameter range. LEFT VERTEBRAL ARTERY:  Patent with normal antegrade flow. IMPRESSION: 1. Moderate (50-69%) stenosis proximal right internal carotid artery secondary to heterogenous atherosclerotic plaque. Subjectively, the stenosis appears to be at the lower end of the range. 2. Moderate (50-69%) stenosis proximal left internal carotid artery secondary to heterogenous atherosclerotic plaque. Subjectively, the stenosis appears to be near the lower end of the range. 3. Vertebral arteries are patent with antegrade flow. Signed, Criselda Peaches, MD, [facility] Electronically Signed   By: Jacqulynn Cadet M.D.   On: 06/24/2018 09:10   Mr Jodene Nam Head/brain KC Cm  Result Date: 06/24/2018 CLINICAL DATA:  Initial evaluation for acute ataxia, stroke suspected. EXAM: MRI HEAD WITHOUT CONTRAST MRA HEAD WITHOUT CONTRAST  TECHNIQUE: Multiplanar, multiecho pulse sequences of the brain and surrounding structures were obtained without intravenous contrast. Angiographic images of the head were obtained using MRA technique without contrast. COMPARISON:  Prior CT from 06/23/2018 FINDINGS: MRI HEAD FINDINGS Brain: Generalized age-related cerebral atrophy. Confluent T2/FLAIR hyperintensity within the periventricular and deep white matter both cerebral hemispheres most consistent with chronic small vessel ischemic disease, mild to moderate nature. No abnormal  foci of restricted diffusion to suggest acute or subacute ischemia. Gray-white matter differentiation maintained. No areas of remote cortical infarction. No foci of susceptibility artifact to suggest acute or chronic intracranial hemorrhage. 5.5 cm benign arachnoid cyst overlies the left frontal convexity. No other mass lesion or mass effect. No midline shift. No hydrocephalus. No other extra-axial fluid collection. Pituitary gland within normal limits. Vascular: Major intracranial vascular flow voids maintained. Skull and upper cervical spine: Craniocervical junction within normal limits. Grade 1 anterolisthesis of C4 on C5 noted, likely chronic and degenerative in nature. Remainder the visualized upper cervical spine otherwise within normal limits. Bone marrow signal intensity normal. No scalp soft tissue abnormality. Sinuses/Orbits: Patient status post bilateral ocular lens replacement. Paranasal sinuses and mastoid air cells are clear. Inner ear structures normal. Other: None. MRA HEAD FINDINGS ANTERIOR CIRCULATION: Distal cervical segments of the internal carotid arteries are patent with antegrade flow. Petrous segments widely patent bilaterally. Scattered atheromatous irregularity throughout the cavernous/supraclinoid ICAs with associated mild to moderate multifocal narrowing, right slightly worse than left. ICA termini widely patent bilaterally. Short-segment mild left A1 stenosis. A1 segments otherwise widely patent. Normal anterior communicating artery. ACAs widely patent to their distal aspects without high-grade stenosis. Diffuse atheromatous irregularity within the M1 segments bilaterally without high-grade stenosis. MCA bifurcations within normal limits. There is a severe near occlusive proximal left M2 stenosis measuring 5 mm in length, superior division (series 10, image 128). No proximal right M2 stenosis or occlusion. Moderate diffuse distal small vessel atheromatous irregularity throughout the MCA  branches bilaterally. POSTERIOR CIRCULATION: Vertebral arteries widely patent to the vertebrobasilar junction without flow-limiting stenosis. Posterior inferior cerebral arteries patent bilaterally. Basilar patent to its distal aspect without high-grade stenosis. Superior cerebral arteries patent bilaterally. Both of the PCAs supplied via the basilar artery. Moderate atheromatous irregularity throughout the PCAs bilaterally with multifocal moderate bilateral P2 stenoses. PCAs are patent to their distal aspects. 3 mm focal outpouching arising from the cavernous right ICA suspicious for small aneurysm (series 10, image 109). This is directed medially and posteriorly. IMPRESSION: MRI HEAD IMPRESSION: 1. No acute intracranial infarct or other abnormality identified. 2. Age-related cerebral atrophy with mild to moderate chronic small vessel ischemic disease. 3. 5.5 cm benign arachnoid cyst overlying the left frontal convexity. MRA HEAD IMPRESSION: 1. Negative intracranial MRA for large vessel occlusion. 5 mm severe near occlusive proximal left M2 stenosis, superior division. 2. Additional moderate intracranial atherosclerotic change involving the anterior and posterior circulations as above. No other proximal high-grade or correctable stenosis identified. 3. 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 Electronically Signed   By: Jeannine Boga M.D.   On: 06/24/2018 03:00   Ct Head Code Stroke Wo Contrast  Result Date: 06/23/2018 CLINICAL DATA:  Code stroke.  Vertigo nausea vomiting EXAM: CT HEAD WITHOUT CONTRAST TECHNIQUE: Contiguous axial images were obtained from the base of the skull through the vertex without intravenous contrast. COMPARISON:  CT head 04/24/2013 FINDINGS: Brain: Moderate atrophy. Moderate chronic microvascular ischemic change in the white matter. Negative for acute infarct, hemorrhage, mass. CSF density cyst lateral to the left  frontal lobe is unchanged measuring 5.4 x 2.4 cm, consistent  with arachnoid cyst. Vascular: Negative for hyperdense vessel Skull: Negative Sinuses/Orbits: Paranasal sinuses clear.  No orbital mass. Other: None ASPECTS ([place] Stroke Program Early CT Score) - Ganglionic level infarction (caudate, lentiform nuclei, internal capsule, insula, M1-M3 cortex): 7 - Supraganglionic infarction (M4-M6 cortex): 3 Total score (0-10 with 10 being normal): 10 IMPRESSION: 1. No acute intracranial abnormality 2. ASPECTS is 10 3. These results were called by telephone at the time of interpretation on 06/23/2018 at 3:46 pm to Dr. Larae Grooms , who verbally acknowledged these results. Electronically Signed   By: Franchot Gallo M.D.   On: 06/23/2018 15:47    EKG:   Orders placed or performed during the hospital encounter of 06/23/18  . EKG 12-Lead  . EKG 12-Lead  . ED EKG  . ED EKG      Management plans discussed with the patient, family and they are in agreement.  CODE STATUS:     Code Status Orders  (From admission, onward)         Start     Ordered   06/23/18 1724  Do not attempt resuscitation (DNR)  Continuous    Question Answer Comment  In the event of cardiac or respiratory ARREST Do not call a "code blue"   In the event of cardiac or respiratory ARREST Do not perform Intubation, CPR, defibrillation or ACLS   In the event of cardiac or respiratory ARREST Use medication by any route, position, wound care, and other measures to relive pain and suffering. May use oxygen, suction and manual treatment of airway obstruction as needed for comfort.      06/23/18 1726        Code Status History    This patient has a current code status but no historical code status.    Advance Directive Documentation     Most Recent Value  Type of Advance Directive  Healthcare Power of Attorney, Living will  Pre-existing out of facility DNR order (yellow form or pink MOST form)  -  "MOST" Form in Place?  -      TOTAL TIME TAKING CARE OF THIS PATIENT: 40 minutes.     Avel Peace Falcon Mccaskey M.D on 06/24/2018 at 11:16 AM  Between 7am to 6pm - Pager - [phone]  After 6pm go to [email or web address] - password [email or web address] [facility]  Office  [phone]  CC: Primary care physician; [name], MD   Note: This dictation was prepared with Dragon dictation along with smaller phrase technology. Any transcriptional errors that result from this process are unintentional.

## 2018-06-24 NOTE — Consult Note (Signed)
Reason for Consult:dizziness  Referring Physician: Dr. Jerelyn Charles  CC: dizziness   HPI: Jamie Hodges is an 84 y.o. female with a known history of atrial fibrillation(brief)  on aspirin, hypertension, history of vertigo presents to the emergency room complaining of acute onset of dizziness, nausea and vertigo-like symptoms at 1:30 PM yesteday  Patient has had similar symptoms in the past but this was the worst she has had.  Continue to happen even at rest without change in position. On admission SBP in the 200s, now improved and back to baseline  Past Medical History:  Diagnosis Date  . Atrial fibrillation ([place]) 2010   One time during hospital while sick with severe diarrhea  . C. difficile colitis    3/10  Severe C. dif ---had brief atrial fib then. Cath shows some blockages but no intervention  . CAD (coronary artery disease) 2010   minor blockages --no intervention indicated  . Hx of colonic polyps   . Hyperlipidemia   . Hypertension     Past Surgical History:  Procedure Laterality Date  . CHOLECYSTECTOMY  3/16  . ERCP N/A 01/14/2015   Procedure: ENDOSCOPIC RETROGRADE CHOLANGIOPANCREATOGRAPHY (ERCP);  Surgeon: Lucilla Lame, MD;  Location: [facility] ENDOSCOPY;  Service: Endoscopy;  Laterality: N/A;  . TONSILLECTOMY AND ADENOIDECTOMY      Family History  Problem Relation Age of Onset  . COPD Mother   . Coronary artery disease Neg Hx   . Diabetes Neg Hx   . Cancer Neg Hx        breast or colon cancer    Social History:  reports that she quit smoking about 39 years ago. She has never used smokeless tobacco. She reports that she does not drink alcohol or use drugs.  Allergies  Allergen Reactions  . Codeine Sulfate     REACTION: rash/ welps  . Crestor [Rosuvastatin Calcium]     myalgia  . Penicillins Hives and Rash    Has patient had a PCN reaction causing immediate rash, facial/tongue/throat swelling, SOB or lightheadedness with hypotension: Yes Has patient had a PCN reaction causing  severe rash involving mucus membranes or skin necrosis: No Has patient had a PCN reaction that required hospitalization: No Has patient had a PCN reaction occurring within the last 10 years: Yes If all of the above answers are "NO", then may proceed with Cephalosporin use.    Medications: I have reviewed the patient's current medications.  ROS: History obtained from the patient  General ROS: negative for - chills, fatigue, fever, night sweats, weight gain or weight loss Psychological ROS: negative for - behavioral disorder, hallucinations, memory difficulties, mood swings or suicidal ideation Ophthalmic ROS: negative for - blurry vision, double vision, eye pain or loss of vision ENT ROS: negative for - epistaxis, nasal discharge, oral lesions, sore throat, tinnitus or vertigo Allergy and Immunology ROS: negative for - hives or itchy/watery eyes Hematological and Lymphatic ROS: negative for - bleeding problems, bruising or swollen lymph nodes Endocrine ROS: negative for - galactorrhea, hair pattern changes, polydipsia/polyuria or temperature intolerance Respiratory ROS: negative for - cough, hemoptysis, shortness of breath or wheezing Cardiovascular ROS: negative for - chest pain, dyspnea on exertion, edema or irregular heartbeat Gastrointestinal ROS: negative for - abdominal pain, diarrhea, hematemesis, nausea/vomiting or stool incontinence Genito-Urinary ROS: negative for - dysuria, hematuria, incontinence or urinary frequency/urgency Musculoskeletal ROS: negative for - joint swelling or muscular weakness Neurological ROS: as noted in HPI Dermatological ROS: negative for rash and skin lesion changes  Physical Examination:  Blood pressure (!) 141/54, pulse 80, temperature 98.6 F (37 C), temperature source Oral, resp. rate 16, height 5\' 7"  (1.702 m), weight 64.7 kg, SpO2 96 %.    Neurological Examination   Mental Status: Alert, oriented, thought content appropriate.  Speech fluent  without evidence of aphasia.  Able to follow 3 step commands without difficulty. Cranial Nerves: II: Discs flat bilaterally; Visual fields grossly normal, pupils equal, round, reactive to light and accommodation III,IV, VI: ptosis not present, extra-ocular motions intact bilaterally V,VII: smile symmetric, facial light touch sensation normal bilaterally VIII: hearing normal bilaterally IX,X: gag reflex present XI: bilateral shoulder shrug XII: midline tongue extension Motor: Right : Upper extremity   5/5    Left:     Upper extremity   5/5  Lower extremity   5/5     Lower extremity   5/5 Tone and bulk:normal tone throughout; no atrophy noted Sensory: Pinprick and light touch intact throughout, bilaterally Deep Tendon Reflexes: 1+ and symmetric throughout Plantars: Right: downgoing   Left: downgoing Cerebellar: normal finger-to-nose, normal rapid alternating movements and normal heel-to-shin test Gait: not tested       Laboratory Studies:   Basic Metabolic Panel: Recent Labs  Lab 06/23/18 1523  NA 141  K 4.5  CL 110  CO2 26  GLUCOSE 140*  BUN 23  CREATININE 0.91  CALCIUM 9.1    Liver Function Tests: Recent Labs  Lab 06/23/18 1523  AST 22  ALT 11  ALKPHOS 55  BILITOT 0.9  PROT 6.9  ALBUMIN 4.0   No results for input(s): LIPASE, AMYLASE in the last 168 hours. No results for input(s): AMMONIA in the last 168 hours.  CBC: Recent Labs  Lab 06/23/18 1523  WBC 8.1  NEUTROABS 5.2  HGB 13.4  HCT 38.6  MCV 96.3  PLT 214    Cardiac Enzymes: Recent Labs  Lab 06/23/18 1523  TROPONINI <0.03    BNP: Invalid input(s): POCBNP  CBG: Recent Labs  Lab 06/23/18 1528  GLUCAP 108*    Microbiology: Results for orders placed or performed in visit on 12/08/15  Clostridium Difficile by PCR     Status: None   Collection Time: 12/09/15  9:18 AM  Result Value Ref Range Status   Toxigenic C. Difficile by PCR Not Detected Not Detected Final    Comment: This test  is for use only with liquid or soft stools; performance characteristics of other clinical specimen types have not been established.   This assay was performed by Cepheid GeneXpert(R) PCR. The performance characteristics of this assay have been determined by [employer]. Performance characteristics refer to the analytical performance of the test.     Coagulation Studies: Recent Labs    06/23/18 1523  LABPROT 11.8  INR 0.87    Urinalysis:  Recent Labs  Lab 06/23/18 2222  COLORURINE YELLOW*  LABSPEC 1.016  PHURINE 5.0  GLUCOSEU NEGATIVE  HGBUR NEGATIVE  BILIRUBINUR NEGATIVE  KETONESUR NEGATIVE  PROTEINUR NEGATIVE  NITRITE NEGATIVE  LEUKOCYTESUR NEGATIVE    Lipid Panel:     Component Value Date/Time   CHOL 185 06/24/2018 0529   TRIG 208 (H) 06/24/2018 0529   HDL 51 06/24/2018 0529   CHOLHDL 3.6 06/24/2018 0529   VLDL 42 (H) 06/24/2018 0529   LDLCALC 92 06/24/2018 0529    HgbA1C:  Lab Results  Component Value Date   HGBA1C 4.9 06/23/2018    Urine Drug Screen:      Component Value Date/Time   LABOPIA NONE  DETECTED 06/23/2018 2222   COCAINSCRNUR NONE DETECTED 06/23/2018 2222   LABBENZ POSITIVE (A) 06/23/2018 2222   AMPHETMU NONE DETECTED 06/23/2018 2222   THCU NONE DETECTED 06/23/2018 2222   LABBARB NONE DETECTED 06/23/2018 2222    Alcohol Level:  Recent Labs  Lab 06/23/18 [place] <10    Other results: EKG: normal EKG, normal sinus rhythm, unchanged from previous tracings.  Imaging: Mr Brain Wo Contrast  Result Date: 06/24/2018 CLINICAL DATA:  Initial evaluation for acute ataxia, stroke suspected. EXAM: MRI HEAD WITHOUT CONTRAST MRA HEAD WITHOUT CONTRAST TECHNIQUE: Multiplanar, multiecho pulse sequences of the brain and surrounding structures were obtained without intravenous contrast. Angiographic images of the head were obtained using MRA technique without contrast. COMPARISON:  Prior CT from 06/23/2018 FINDINGS: MRI HEAD FINDINGS  Brain: Generalized age-related cerebral atrophy. Confluent T2/FLAIR hyperintensity within the periventricular and deep white matter both cerebral hemispheres most consistent with chronic small vessel ischemic disease, mild to moderate nature. No abnormal foci of restricted diffusion to suggest acute or subacute ischemia. Gray-white matter differentiation maintained. No areas of remote cortical infarction. No foci of susceptibility artifact to suggest acute or chronic intracranial hemorrhage. 5.5 cm benign arachnoid cyst overlies the left frontal convexity. No other mass lesion or mass effect. No midline shift. No hydrocephalus. No other extra-axial fluid collection. Pituitary gland within normal limits. Vascular: Major intracranial vascular flow voids maintained. Skull and upper cervical spine: Craniocervical junction within normal limits. Grade 1 anterolisthesis of C4 on C5 noted, likely chronic and degenerative in nature. Remainder the visualized upper cervical spine otherwise within normal limits. Bone marrow signal intensity normal. No scalp soft tissue abnormality. Sinuses/Orbits: Patient status post bilateral ocular lens replacement. Paranasal sinuses and mastoid air cells are clear. Inner ear structures normal. Other: None. MRA HEAD FINDINGS ANTERIOR CIRCULATION: Distal cervical segments of the internal carotid arteries are patent with antegrade flow. Petrous segments widely patent bilaterally. Scattered atheromatous irregularity throughout the cavernous/supraclinoid ICAs with associated mild to moderate multifocal narrowing, right slightly worse than left. ICA termini widely patent bilaterally. Short-segment mild left A1 stenosis. A1 segments otherwise widely patent. Normal anterior communicating artery. ACAs widely patent to their distal aspects without high-grade stenosis. Diffuse atheromatous irregularity within the M1 segments bilaterally without high-grade stenosis. MCA bifurcations within normal  limits. There is a severe near occlusive proximal left M2 stenosis measuring 5 mm in length, superior division (series 10, image 128). No proximal right M2 stenosis or occlusion. Moderate diffuse distal small vessel atheromatous irregularity throughout the MCA branches bilaterally. POSTERIOR CIRCULATION: Vertebral arteries widely patent to the vertebrobasilar junction without flow-limiting stenosis. Posterior inferior cerebral arteries patent bilaterally. Basilar patent to its distal aspect without high-grade stenosis. Superior cerebral arteries patent bilaterally. Both of the PCAs supplied via the basilar artery. Moderate atheromatous irregularity throughout the PCAs bilaterally with multifocal moderate bilateral P2 stenoses. PCAs are patent to their distal aspects. 3 mm focal outpouching arising from the cavernous right ICA suspicious for small aneurysm (series 10, image 109). This is directed medially and posteriorly. IMPRESSION: MRI HEAD IMPRESSION: 1. No acute intracranial infarct or other abnormality identified. 2. Age-related cerebral atrophy with mild to moderate chronic small vessel ischemic disease. 3. 5.5 cm benign arachnoid cyst overlying the left frontal convexity. MRA HEAD IMPRESSION: 1. Negative intracranial MRA for large vessel occlusion. 5 mm severe near occlusive proximal left M2 stenosis, superior division. 2. Additional moderate intracranial atherosclerotic change involving the anterior and posterior circulations as above. No other proximal high-grade or correctable stenosis identified.  3. 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 Electronically Signed   By: Jeannine Boga M.D.   On: 06/24/2018 03:00   US Carotid Bilateral (at [facility] And [facility] Only)  Result Date: 06/24/2018 CLINICAL DATA:  84 year old female with symptoms of transient ischemic attack EXAM: BILATERAL CAROTID DUPLEX ULTRASOUND TECHNIQUE: Pearline Cables scale imaging, color Doppler and duplex ultrasound were performed of bilateral  carotid and vertebral arteries in the neck. COMPARISON:  Brain MRI 06/24/2018 FINDINGS: Criteria: Quantification of carotid stenosis is based on velocity parameters that correlate the residual internal carotid diameter with NASCET-based stenosis levels, using the diameter of the distal internal carotid lumen as the denominator for stenosis measurement. The following velocity measurements were obtained: RIGHT ICA: 145/9 cm/sec CCA: 841/66 cm/sec SYSTOLIC ICA/CCA RATIO:  1.3 ECA:  162 cm/sec LEFT ICA: 149/18 cm/sec CCA: 063/01 cm/sec SYSTOLIC ICA/CCA RATIO:  1.3 ECA:  161 cm/sec RIGHT CAROTID ARTERY: Mild heterogeneous atherosclerotic plaque in the proximal internal carotid artery. By peak systolic velocity criteria, the estimated stenosis falls in the 50-69% range. RIGHT VERTEBRAL ARTERY:  Patent with antegrade flow. LEFT CAROTID ARTERY: Heterogeneous atherosclerotic plaque in the distal common carotid artery extending into the proximal internal carotid artery. By peak systolic velocity criteria, the estimated stenosis falls in the 50-69% diameter range. LEFT VERTEBRAL ARTERY:  Patent with normal antegrade flow. IMPRESSION: 1. Moderate (50-69%) stenosis proximal right internal carotid artery secondary to heterogenous atherosclerotic plaque. Subjectively, the stenosis appears to be at the lower end of the range. 2. Moderate (50-69%) stenosis proximal left internal carotid artery secondary to heterogenous atherosclerotic plaque. Subjectively, the stenosis appears to be near the lower end of the range. 3. Vertebral arteries are patent with antegrade flow. Signed, Criselda Peaches, MD, [facility] Electronically Signed   By: Jacqulynn Cadet M.D.   On: 06/24/2018 09:10   Mr Jodene Nam Head/brain SW Cm  Result Date: 06/24/2018 CLINICAL DATA:  Initial evaluation for acute ataxia, stroke suspected. EXAM: MRI HEAD WITHOUT CONTRAST MRA HEAD WITHOUT CONTRAST  TECHNIQUE: Multiplanar, multiecho pulse sequences of the brain and surrounding structures were obtained without intravenous contrast. Angiographic images of the head were obtained using MRA technique without contrast. COMPARISON:  Prior CT from 06/23/2018 FINDINGS: MRI HEAD FINDINGS Brain: Generalized age-related cerebral atrophy. Confluent T2/FLAIR hyperintensity within the periventricular and deep white matter both cerebral hemispheres most consistent with chronic small vessel ischemic disease, mild to moderate nature. No abnormal foci of restricted diffusion to suggest acute or subacute ischemia. Gray-white matter differentiation maintained. No areas of remote cortical infarction. No foci of susceptibility artifact to suggest acute or chronic intracranial hemorrhage. 5.5 cm benign arachnoid cyst overlies the left frontal convexity. No other mass lesion or mass effect. No midline shift. No hydrocephalus. No other extra-axial fluid collection. Pituitary gland within normal limits. Vascular: Major intracranial vascular flow voids maintained. Skull and upper cervical spine: Craniocervical junction within normal limits. Grade 1 anterolisthesis of C4 on C5 noted, likely chronic and degenerative in nature. Remainder the visualized upper cervical spine otherwise within normal limits. Bone marrow signal intensity normal. No scalp soft tissue abnormality. Sinuses/Orbits: Patient status post bilateral ocular lens replacement. Paranasal sinuses and mastoid air cells are clear. Inner ear structures normal. Other: None. MRA HEAD FINDINGS ANTERIOR CIRCULATION: Distal cervical segments of the internal carotid arteries are patent with antegrade flow. Petrous segments widely patent bilaterally. Scattered atheromatous irregularity throughout the cavernous/supraclinoid ICAs with associated mild to moderate multifocal narrowing, right slightly worse than left. ICA  termini widely patent bilaterally. Short-segment mild left A1  stenosis. A1 segments otherwise widely patent. Normal anterior communicating artery. ACAs widely patent to their distal aspects without high-grade stenosis. Diffuse atheromatous irregularity within the M1 segments bilaterally without high-grade stenosis. MCA bifurcations within normal limits. There is a severe near occlusive proximal left M2 stenosis measuring 5 mm in length, superior division (series 10, image 128). No proximal right M2 stenosis or occlusion. Moderate diffuse distal small vessel atheromatous irregularity throughout the MCA branches bilaterally. POSTERIOR CIRCULATION: Vertebral arteries widely patent to the vertebrobasilar junction without flow-limiting stenosis. Posterior inferior cerebral arteries patent bilaterally. Basilar patent to its distal aspect without high-grade stenosis. Superior cerebral arteries patent bilaterally. Both of the PCAs supplied via the basilar artery. Moderate atheromatous irregularity throughout the PCAs bilaterally with multifocal moderate bilateral P2 stenoses. PCAs are patent to their distal aspects. 3 mm focal outpouching arising from the cavernous right ICA suspicious for small aneurysm (series 10, image 109). This is directed medially and posteriorly. IMPRESSION: MRI HEAD IMPRESSION: 1. No acute intracranial infarct or other abnormality identified. 2. Age-related cerebral atrophy with mild to moderate chronic small vessel ischemic disease. 3. 5.5 cm benign arachnoid cyst overlying the left frontal convexity. MRA HEAD IMPRESSION: 1. Negative intracranial MRA for large vessel occlusion. 5 mm severe near occlusive proximal left M2 stenosis, superior division. 2. Additional moderate intracranial atherosclerotic change involving the anterior and posterior circulations as above. No other proximal high-grade or correctable stenosis identified. 3. 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 Electronically Signed   By: Jeannine Boga M.D.   On: 06/24/2018 03:00    Ct Head Code Stroke Wo Contrast  Result Date: 06/23/2018 CLINICAL DATA:  Code stroke.  Vertigo nausea vomiting EXAM: CT HEAD WITHOUT CONTRAST TECHNIQUE: Contiguous axial images were obtained from the base of the skull through the vertex without intravenous contrast. COMPARISON:  CT head 04/24/2013 FINDINGS: Brain: Moderate atrophy. Moderate chronic microvascular ischemic change in the white matter. Negative for acute infarct, hemorrhage, mass. CSF density cyst lateral to the left frontal lobe is unchanged measuring 5.4 x 2.4 cm, consistent with arachnoid cyst. Vascular: Negative for hyperdense vessel Skull: Negative Sinuses/Orbits: Paranasal sinuses clear.  No orbital mass. Other: None ASPECTS ([place] Stroke Program Early CT Score) - Ganglionic level infarction (caudate, lentiform nuclei, internal capsule, insula, M1-M3 cortex): 7 - Supraganglionic infarction (M4-M6 cortex): 3 Total score (0-10 with 10 being normal): 10 IMPRESSION: 1. No acute intracranial abnormality 2. ASPECTS is 10 3. These results were called by telephone at the time of interpretation on 06/23/2018 at 3:46 pm to Dr. Larae Grooms , who verbally acknowledged these results. Electronically Signed   By: Franchot Gallo M.D.   On: 06/23/2018 15:47     Assessment/Plan:  84 y.o. female with a known history of atrial fibrillation(brief)  on aspirin, hypertension, history of vertigo presents to the emergency room complaining of acute onset of dizziness, nausea and vertigo-like symptoms at 1:30 PM yesteday  Patient has had similar symptoms in the past but this was the worst she has had.  Continue to happen even at rest without change in position. On admission SBP in the 200s, now improved and back to baseline  - She does not have chronic hx of A-fib. States had a brief episode and is not on anticoagulation.  - MRI no acute abnormality - LM2 stenosis on MRA. Would con't ASA 325  - No further imaging.  - Vertigo likely peripheral - BP  being adjusted by primary team -  d/c planning  06/24/2018, 12:07 PM

## 2018-06-24 NOTE — Progress Notes (Signed)
Chart reviewed, Pt visited. Pt is observation for stroke workup. Passed swallow screen. Pt reported no swallowing or speech problems with all issues resolved. She is requesting to go home. NO ST needs at this time. Speech is appropriate and fluent for screening.

## 2018-06-24 NOTE — Evaluation (Signed)
Physical Therapy Evaluation Patient Details Name: Kenndra Morris MRN: [id number] DOB: [birth year] Today's Date: 06/24/2018   History of Present Illness  84 y.o. female with a known history of atrial fibrillation on aspirin, hypertension, history of vertigo presents to the emergency room complaining of acute onset of dizziness, nausea and vertigo-like symptoms.  She reports she has had some mild vertigo/dizziness episodes but that this is the most severe.  Pt is feeling back to baseline at time of PT exam and eager to go home.   Clinical Impression  Pt did very well with PT exam showing good mobility, strength, balance and ambulation.  She showed no hesitation with getting to standing and circumambulating the nurses' station as well as negotiating up/down 6 steps with single rail w/o issue.  She is eager to go home and reports she does have assistance if needed but feels confident that she is at/near her baseline and will have no issues.  Not PT follow up needed at this time.     Follow Up Recommendations No PT follow up    Equipment Recommendations  None recommended by PT    Recommendations for Other Services       Precautions / Restrictions Precautions Precautions: Fall Restrictions Weight Bearing Restrictions: No      Mobility  Bed Mobility Overal bed mobility: Independent                Transfers Overall transfer level: Independent Equipment used: None             General transfer comment: Easily gets to standing w/o assist, good safety/balance/confidence  Ambulation/Gait Ambulation/Gait assistance: Modified independent (Device/Increase time) Gait Distance (Feet): 250 Feet Assistive device: None       General Gait Details: Pt with slightly stooped posture and at times seemed impulsive in a hurry to prove that she could go home.  She did ultimately have no true LOBs and reports to essentially be back at baseline. Pt with no fatigue and stable vitals t/o the  effort.   Stairs            Wheelchair Mobility    Modified Rankin (Stroke Patients Only)       Balance Overall balance assessment: Modified Independent                                           Pertinent Vitals/Pain Pain Assessment: No/denies pain    Home Living Family/patient expects to be discharged to:: Private residence Living Arrangements: Alone Available Help at Discharge: Neighbor;Friend(s)   Home Access: Stairs to enter   [employer] of Steps: 3          Prior Function Level of Independence: Independent         Comments: Pt drives, runs errands, stays active     Hand Dominance        Extremity/Trunk Assessment   Upper Extremity Assessment Upper Extremity Assessment: Overall WFL for tasks assessed    Lower Extremity Assessment Lower Extremity Assessment: Overall WFL for tasks assessed       Communication   Communication: No difficulties  Cognition Arousal/Alertness: Awake/alert Behavior During Therapy: WFL for tasks assessed/performed Overall Cognitive Status: Within Functional Limits for tasks assessed  General Comments      Exercises     Assessment/Plan    PT Assessment Patent does not need any further PT services  PT Problem List         PT Treatment Interventions      PT Goals (Current goals can be found in the Care Plan section)  Acute Rehab PT Goals Patient Stated Goal: go home ASAP PT Goal Formulation: All assessment and education complete, DC therapy    Frequency     Barriers to discharge        Co-evaluation               AM-PAC PT "6 Clicks" Daily Activity  Outcome Measure Difficulty turning over in bed (including adjusting bedclothes, sheets and blankets)?: None Difficulty moving from lying on back to sitting on the side of the bed? : None Difficulty sitting down on and standing up from a chair with arms (e.g.,  wheelchair, bedside commode, etc,.)?: None Help needed moving to and from a bed to chair (including a wheelchair)?: None Help needed walking in hospital room?: None Help needed climbing 3-5 steps with a railing? : None 6 Click Score: 24    End of Session Equipment Utilized During Treatment: Gait belt Activity Tolerance: Patient tolerated treatment well Patient left: with chair alarm set;with call bell/phone within reach Nurse Communication: Mobility status PT Visit Diagnosis: Dizziness and giddiness (R42);Difficulty in walking, not elsewhere classified (R26.2)    Time: 0810-0822 PT Time Calculation (min) (ACUTE ONLY): 12 min   Charges:   PT Evaluation $PT Eval Low Complexity: 1 Low          Kreg Shropshire, DPT 06/24/2018, 11:26 AM

## 2018-06-24 NOTE — Progress Notes (Addendum)
Pt was transported to MRI.   Update 0220: Pt just got back from MRI

## 2018-06-24 NOTE — Progress Notes (Signed)
The patient is discharging back to [facility] independent living. IV and heart monitor removed. Discharge instructions and education reviewed with the patient and her son. Prescription provided to patient in discharge packet. The patient was escorted out by volunteer services via wheelchair to her son's vehicle.

## 2018-06-26 ENCOUNTER — Telehealth: Payer: Self-pay

## 2018-06-26 NOTE — Telephone Encounter (Signed)
Spoke to pt. She had stopped in the office this morning and made an appt for 06-29-18.  She said her BP has not come down with the new med. I told her to call us if does not come down before her appt.

## 2018-06-26 NOTE — Telephone Encounter (Signed)
Left message to see how she was doing. Dr Silvio Pate had mentioned she may need a f/u with him.

## 2018-06-27 ENCOUNTER — Telehealth: Payer: Self-pay

## 2018-06-27 NOTE — Telephone Encounter (Signed)
LM on patient VM (okay per DPR) for patient to call back for hospital follow up call.

## 2018-06-27 NOTE — Telephone Encounter (Signed)
Note patient has set up hospital follow up appointment for 06/29/18.

## 2018-06-27 NOTE — Telephone Encounter (Signed)
LM on cell VM to please call back for hospital follow up call.

## 2018-06-28 NOTE — Telephone Encounter (Signed)
Transition Care Management Follow-up Telephone Call   Date discharged? 06/24/2018   How have you been since you were released from the hospital? Feeling better.   Do you understand why you were in the hospital? Yes   Do you understand the discharge instructions? Yes   Where were you discharged to? Home   Items Reviewed:  Medications reviewed: Yes  Allergies reviewed: Yes  Dietary changes reviewed: Yes- heart healthy diet  Referrals reviewed: yes- needs to see ENT specialist   Functional Questionnaire:   Activities of Daily Living (ADLs):   She states they are independent in the following: ambulation, feeding, bathing, toileting, dressing, hygiene, grooming, continence. States they require assistance with the following: none   Any transportation issues/concerns?: No.   Any patient concerns? Yes-would like to get her b/p down and should she be taking Meclizine on daily basis to prevent the dizzy spells. Patient states she needs to see ENT provider and was told to get PCP to set this up   Confirmed importance and date/time of follow-up visits scheduled Yes  Provider Appointment booked with Dr Silvio Pate on 06/29/18.  Confirmed with patient if condition begins to worsen call PCP or go to the ER.  Patient was given the office number and encouraged to call back with question or concerns.  : Yes

## 2018-06-29 ENCOUNTER — Encounter: Payer: Self-pay | Admitting: Internal Medicine

## 2018-06-29 ENCOUNTER — Ambulatory Visit (INDEPENDENT_AMBULATORY_CARE_PROVIDER_SITE_OTHER): Payer: Medicare Other | Admitting: Internal Medicine

## 2018-06-29 VITALS — BP 136/84 | HR 65 | Temp 98.1°F | Ht 63.0 in | Wt 152.0 lb

## 2018-06-29 DIAGNOSIS — R42 Dizziness and giddiness: Secondary | ICD-10-CM

## 2018-06-29 DIAGNOSIS — I679 Cerebrovascular disease, unspecified: Secondary | ICD-10-CM | POA: Diagnosis not present

## 2018-06-29 DIAGNOSIS — G930 Cerebral cysts: Secondary | ICD-10-CM | POA: Diagnosis not present

## 2018-06-29 DIAGNOSIS — I10 Essential (primary) hypertension: Secondary | ICD-10-CM | POA: Diagnosis not present

## 2018-06-29 MED ORDER — DIAZEPAM 2 MG PO TABS: 2.0000 mg | ORAL_TABLET | Freq: Three times a day (TID) | ORAL | 0 refills | Status: DC | PRN

## 2018-06-29 NOTE — Assessment & Plan Note (Signed)
BP Readings from Last 3 Encounters:  06/29/18 136/84  06/24/18 (!) 141/54  03/29/18 (!) 176/72   Amlodipine started in hospital Repeat after our talks-- 144/80 She will check with the [facility] nurse, and we will increase the amlodipine if her BP goes way up still

## 2018-06-29 NOTE — Progress Notes (Signed)
Subjective:    Patient ID: Jamie Hodges, female    DOB: [birth year], 84 y.o.   MRN: [id number]  HPI Here for hospital follow up  Felt vertigo coming on--was playing bridge She had to "lurch" into the bed Was sweating and nauseated Tried meclizine and it didn't help Hit hard---one of the women there was a nurse---called [employer] copy Suggested EMS BP was high --and as high as 016 systolic at the [facility] clinic  Reviewed hospital course Vertigo did improve---sent home after 1 night MRI/MRA done---multiple findings Arachnoid cyst without shift Multiple narrowing of vessels --but no stroke  Feels better now On ASA daily--she thinks it may be regular dose though (she will check) Amlodipine added  Current Outpatient Medications on File Prior to Visit  Medication Sig Dispense Refill  . amLODipine (NORVASC) 2.5 MG tablet Take 1 tablet (2.5 mg total) by mouth at bedtime. 90 tablet 0  . aspirin EC 81 MG tablet Take 81 mg by mouth daily.    . cholestyramine light (PREVALITE) 4 g packet Take 1 packet (4 g total) by mouth 2 (two) times daily. (Patient taking differently: Take 4 g by mouth daily. After a meal) 180 packet 3  . EPINEPHrine 0.3 mg/0.3 mL IJ SOAJ injection Inject 0.3 mg into the muscle as needed (for allergic reaction).    . meclizine (ANTIVERT) 25 MG tablet TAKE 1 TABLET BY MOUTH THREE TIMES DAILY AS NEEDED FOR NAUSEA OR DIZZINESS. (Patient taking differently: Take 25 mg by mouth 3 (three) times daily as needed for dizziness. ) 60 tablet 2  . metoprolol (TOPROL-XL) 200 MG 24 hr tablet Take 1 tablet (200 mg total) by mouth daily. 30 tablet 3  . ramipril (ALTACE) 10 MG capsule TAKE 1 CAPSULE DAILY (Patient taking differently: Take 10 mg by mouth daily. ) 90 capsule 3   No current facility-administered medications on file prior to visit.     Allergies  Allergen Reactions  . Codeine Sulfate     REACTION: rash/ welps  . Crestor [Rosuvastatin Calcium]     myalgia  .  Penicillins Hives and Rash    Has patient had a PCN reaction causing immediate rash, facial/tongue/throat swelling, SOB or lightheadedness with hypotension: Yes Has patient had a PCN reaction causing severe rash involving mucus membranes or skin necrosis: No Has patient had a PCN reaction that required hospitalization: No Has patient had a PCN reaction occurring within the last 10 years: Yes If all of the above answers are "NO", then may proceed with Cephalosporin use.    Past Medical History:  Diagnosis Date  . Atrial fibrillation ([place]) 2010   One time during hospital while sick with severe diarrhea  . C. difficile colitis    3/10  Severe C. dif ---had brief atrial fib then. Cath shows some blockages but no intervention  . CAD (coronary artery disease) 2010   minor blockages --no intervention indicated  . Hx of colonic polyps   . Hyperlipidemia   . Hypertension     Past Surgical History:  Procedure Laterality Date  . CHOLECYSTECTOMY  3/16  . ERCP N/A 01/14/2015   Procedure: ENDOSCOPIC RETROGRADE CHOLANGIOPANCREATOGRAPHY (ERCP);  Surgeon: Lucilla Lame, MD;  Location: [facility] ENDOSCOPY;  Service: Endoscopy;  Laterality: N/A;  . TONSILLECTOMY AND ADENOIDECTOMY      Family History  Problem Relation Age of Onset  . COPD Mother   . Coronary artery disease Neg Hx   . Diabetes Neg Hx   . Cancer Neg Hx  breast or colon cancer    Social History   Socioeconomic History  . Marital status: Married    Spouse name: Not on file  . Number of children: 3  . Years of education: Not on file  . Highest education level: Not on file  Occupational History  . Occupation: retired Technical sales engineer  Social Needs  . Financial resource strain: Not on file  . Food insecurity:    Worry: Not on file    Inability: Not on file  . Transportation needs:    Medical: Not on file    Non-medical: Not on file  Tobacco Use  . Smoking status: Former Smoker    Last attempt to quit: 08/16/1978    Years  since quitting: 39.8  . Smokeless tobacco: Never Used  Substance and Sexual Activity  . Alcohol use: Never    Frequency: Never  . Drug use: No  . Sexual activity: Not on file  Lifestyle  . Physical activity:    Days per week: Not on file    Minutes per session: Not on file  . Stress: Not on file  Relationships  . Social connections:    Talks on phone: Not on file    Gets together: Not on file    Attends religious service: Not on file    Active member of club or organization: Not on file    Attends meetings of clubs or organizations: Not on file    Relationship status: Not on file  . Intimate partner violence:    Fear of current or ex partner: Not on file    Emotionally abused: Not on file    Physically abused: Not on file    Forced sexual activity: Not on file  Other Topics Concern  . Not on file  Social History Narrative   Has living will.    Son Mikki Santee (MD) to make health care decisions.--then son Laurey Arrow   Has DNR order in past and requests again--done   No tube feeds if cognitively unaware         Review of Systems Not sleeping well--nothing different Appetite is okay Still driving, etc Discussed analgesics--- okay tylenol and occasional ibuprofen (normal renal function)    Objective:   Physical Exam  Constitutional: She appears well-developed. No distress.  Eyes:  No nystagmus  Cardiovascular: Normal rate, regular rhythm and normal heart sounds. Exam reveals no gallop.  No murmur heard. Respiratory: Effort normal and breath sounds normal. No respiratory distress. She has no wheezes. She has no rales.  Musculoskeletal: She exhibits no edema.  Psychiatric: She has a normal mood and affect. Her behavior is normal.           Assessment & Plan:

## 2018-06-29 NOTE — Assessment & Plan Note (Signed)
On MRA On aspirin  No other action

## 2018-06-29 NOTE — Assessment & Plan Note (Signed)
Seems to be peripheral  Better now Will try meclizine Rx for valium to try if that doesn't work if it recurs

## 2018-06-29 NOTE — Assessment & Plan Note (Signed)
No action on this 

## 2018-07-26 ENCOUNTER — Telehealth: Payer: Self-pay | Admitting: Internal Medicine

## 2018-07-26 MED ORDER — METOPROLOL SUCCINATE ER 200 MG PO TB24: 200.0000 mg | ORAL_TABLET | Freq: Every day | ORAL | 3 refills | Status: DC

## 2018-07-26 NOTE — Telephone Encounter (Signed)
Rx sent to [facility]

## 2018-07-26 NOTE — Telephone Encounter (Signed)
Pt called to request a refill of metoprolol 200MG  sent [facility] due to insurance. (Cannot refill at [facility].) She is not out, just needs pharmacy updated for refills.

## 2018-08-14 ENCOUNTER — Telehealth: Payer: Self-pay | Admitting: Internal Medicine

## 2018-08-14 MED ORDER — AMLODIPINE BESYLATE 5 MG PO TABS: 5.0000 mg | ORAL_TABLET | Freq: Every day | ORAL | 3 refills | Status: DC

## 2018-08-14 NOTE — Telephone Encounter (Signed)
Left detailed message on VM per DPR. Rx sent in to [facility] for the 5mg . Will leave open to make sure she makes her OV in 2-3 weeks.

## 2018-08-14 NOTE — Telephone Encounter (Signed)
Please have her increase the amlodipine to 5mg  daily (take two of the 2.5mg  tabs daily and send new Rx). Set up appt in 2-3 weeks to review how her BP is

## 2018-08-14 NOTE — Telephone Encounter (Signed)
Spoke to pt. She has an appt in 1 week and we will decide from there.

## 2018-08-14 NOTE — Telephone Encounter (Signed)
Patient came in to drop off BP readings. Labeled and placed in RX tower.

## 2018-08-14 NOTE — Telephone Encounter (Signed)
Note placed in Dr Alla German Inbox on his desk

## 2018-08-21 ENCOUNTER — Encounter: Payer: Self-pay | Admitting: Internal Medicine

## 2018-08-21 ENCOUNTER — Ambulatory Visit (INDEPENDENT_AMBULATORY_CARE_PROVIDER_SITE_OTHER): Payer: Medicare Other | Admitting: Internal Medicine

## 2018-08-21 VITALS — BP 130/82 | HR 66 | Temp 97.8°F | Ht 63.0 in | Wt 152.0 lb

## 2018-08-21 DIAGNOSIS — R42 Dizziness and giddiness: Secondary | ICD-10-CM

## 2018-08-21 DIAGNOSIS — I10 Essential (primary) hypertension: Secondary | ICD-10-CM

## 2018-08-21 NOTE — Assessment & Plan Note (Signed)
BP Readings from Last 3 Encounters:  08/21/18 130/82  06/29/18 136/84  06/24/18 (!) 141/54   Good control now No change needed

## 2018-08-21 NOTE — Assessment & Plan Note (Signed)
Having brief spells of true vertigo Only seconds Not affecting her activities, etc No action needed since had MRI/MRA already

## 2018-08-21 NOTE — Progress Notes (Signed)
Subjective:    Patient ID: Jamie Hodges, female    DOB: 1927/11/20, 85 y.o.   MRN: [id number]  HPI  Here due to dizziness  Had been very bad when she had the vertigo 2 months ago (and wound up in hospital) Still gets dizzy if she stands up too fast Able to swim still---no problems in pool  She has ongoing dizziness which is more manageable Not as severe as when she went to hospital Now occurring once or twice a week Usually only lasts a few seconds  Current Outpatient Medications on File Prior to Visit  Medication Sig Dispense Refill  . amLODipine (NORVASC) 5 MG tablet Take 1 tablet (5 mg total) by mouth daily. 90 tablet 3  . aspirin EC 81 MG tablet Take 81 mg by mouth daily.    . cholestyramine light (PREVALITE) 4 g packet Take 1 packet (4 g total) by mouth 2 (two) times daily. (Patient taking differently: Take 4 g by mouth daily. After a meal) 180 packet 3  . diazepam (VALIUM) 2 MG tablet Take 1-2 tablets (2-4 mg total) by mouth 3 (three) times daily as needed. For bad vertigo attack 30 tablet 0  . EPINEPHrine 0.3 mg/0.3 mL IJ SOAJ injection Inject 0.3 mg into the muscle as needed (for allergic reaction).    . meclizine (ANTIVERT) 25 MG tablet TAKE 1 TABLET BY MOUTH THREE TIMES DAILY AS NEEDED FOR NAUSEA OR DIZZINESS. (Patient taking differently: Take 25 mg by mouth 3 (three) times daily as needed for dizziness. ) 60 tablet 2  . metoprolol (TOPROL-XL) 200 MG 24 hr tablet Take 1 tablet (200 mg total) by mouth daily. 90 tablet 3  . ramipril (ALTACE) 10 MG capsule TAKE 1 CAPSULE DAILY (Patient taking differently: Take 10 mg by mouth daily. ) 90 capsule 3   No current facility-administered medications on file prior to visit.     Allergies  Allergen Reactions  . Codeine Sulfate     REACTION: rash/ welps  . Crestor [Rosuvastatin Calcium]     myalgia  . Penicillins Hives and Rash    Has patient had a PCN reaction causing immediate rash, facial/tongue/throat swelling, SOB or  lightheadedness with hypotension: Yes Has patient had a PCN reaction causing severe rash involving mucus membranes or skin necrosis: No Has patient had a PCN reaction that required hospitalization: No Has patient had a PCN reaction occurring within the last 10 years: Yes If all of the above answers are "NO", then may proceed with Cephalosporin use.    Past Medical History:  Diagnosis Date  . Atrial fibrillation ([place]) 2010   One time during hospital while sick with severe diarrhea  . C. difficile colitis    3/10  Severe C. dif ---had brief atrial fib then. Cath shows some blockages but no intervention  . CAD (coronary artery disease) 2010   minor blockages --no intervention indicated  . Hx of colonic polyps   . Hyperlipidemia   . Hypertension     Past Surgical History:  Procedure Laterality Date  . CHOLECYSTECTOMY  3/16  . ERCP N/A 01/14/2015   Procedure: ENDOSCOPIC RETROGRADE CHOLANGIOPANCREATOGRAPHY (ERCP);  Surgeon: Lucilla Lame, MD;  Location: [facility] ENDOSCOPY;  Service: Endoscopy;  Laterality: N/A;  . TONSILLECTOMY AND ADENOIDECTOMY      Family History  Problem Relation Age of Onset  . COPD Mother   . Coronary artery disease Neg Hx   . Diabetes Neg Hx   . Cancer Neg Hx  breast or colon cancer    Social History   Socioeconomic History  . Marital status: Married    Spouse name: Not on file  . Number of children: 3  . Years of education: Not on file  . Highest education level: Not on file  Occupational History  . Occupation: retired Technical sales engineer  Social Needs  . Financial resource strain: Not on file  . Food insecurity:    Worry: Not on file    Inability: Not on file  . Transportation needs:    Medical: Not on file    Non-medical: Not on file  Tobacco Use  . Smoking status: Former Smoker    Last attempt to quit: 08/16/1978    Years since quitting: 40.0  . Smokeless tobacco: Never Used  Substance and Sexual Activity  . Alcohol use: Never     Frequency: Never  . Drug use: No  . Sexual activity: Not on file  Lifestyle  . Physical activity:    Days per week: Not on file    Minutes per session: Not on file  . Stress: Not on file  Relationships  . Social connections:    Talks on phone: Not on file    Gets together: Not on file    Attends religious service: Not on file    Active member of club or organization: Not on file    Attends meetings of clubs or organizations: Not on file    Relationship status: Not on file  . Intimate partner violence:    Fear of current or ex partner: Not on file    Emotionally abused: Not on file    Physically abused: Not on file    Forced sexual activity: Not on file  Other Topics Concern  . Not on file  Social History Narrative   Has living will.    Son Mikki Santee (MD) to make health care decisions.--then son Laurey Arrow   Has DNR order in past and requests again--done   No tube feeds if cognitively unaware         Review of Systems  No headaches No diplopia Sleeps fine Appetite is fine     Objective:   Physical Exam  Constitutional: She is oriented to person, place, and time.  Eyes: EOM are normal.  No nystagmus  Neurological: She is alert and oriented to person, place, and time. She has normal strength. No cranial nerve deficit. She displays a negative Romberg sign. Coordination and gait normal.           Assessment & Plan:

## 2018-11-06 ENCOUNTER — Telehealth: Payer: Self-pay | Admitting: Internal Medicine

## 2018-11-06 MED ORDER — CHOLESTYRAMINE LIGHT 4 G PO PACK: 4.0000 g | PACK | Freq: Two times a day (BID) | ORAL | 3 refills | Status: DC

## 2018-11-06 NOTE — Telephone Encounter (Signed)
Best number [phone] Pt called needing to get a refill on  pre[facility]ite   [facility]  Pt has 1 week of med left

## 2018-11-06 NOTE — Telephone Encounter (Signed)
I sent in the prescription to [facility].

## 2019-01-24 ENCOUNTER — Other Ambulatory Visit: Payer: Self-pay | Admitting: Family Medicine

## 2019-02-23 ENCOUNTER — Encounter: Payer: Self-pay | Admitting: Internal Medicine

## 2019-02-23 ENCOUNTER — Ambulatory Visit (INDEPENDENT_AMBULATORY_CARE_PROVIDER_SITE_OTHER): Payer: Medicare Other | Admitting: Internal Medicine

## 2019-02-23 ENCOUNTER — Other Ambulatory Visit: Payer: Self-pay

## 2019-02-23 DIAGNOSIS — N183 Chronic kidney disease, stage 3 unspecified: Secondary | ICD-10-CM | POA: Insufficient documentation

## 2019-02-23 DIAGNOSIS — R42 Dizziness and giddiness: Secondary | ICD-10-CM

## 2019-02-23 DIAGNOSIS — Z7189 Other specified counseling: Secondary | ICD-10-CM | POA: Diagnosis not present

## 2019-02-23 DIAGNOSIS — I10 Essential (primary) hypertension: Secondary | ICD-10-CM

## 2019-02-23 DIAGNOSIS — K529 Noninfective gastroenteritis and colitis, unspecified: Secondary | ICD-10-CM

## 2019-02-23 DIAGNOSIS — Z0000 Encounter for general adult medical examination without abnormal findings: Secondary | ICD-10-CM | POA: Diagnosis not present

## 2019-02-23 NOTE — Assessment & Plan Note (Signed)
Does okay with imodium prn

## 2019-02-23 NOTE — Assessment & Plan Note (Signed)
Very mild Mostly age related likely BP is fine No action on this

## 2019-02-23 NOTE — Assessment & Plan Note (Signed)
I have personally reviewed the Medicare Annual Wellness questionnaire and have noted 1. The patient's medical and social history 2. Their use of alcohol, tobacco or illicit drugs 3. Their current medications and supplements 4. The patient's functional ability including ADL's, fall risks, home safety risks and hearing or visual             impairment. 5. Diet and physical activities 6. Evidence for depression or mood disorders  The patients weight, height, BMI and visual acuity have been recorded in the chart I have made referrals, counseling and provided education to the patient based review of the above and I have provided the pt with a written personalized care plan for preventive services.  I have provided you with a copy of your personalized plan for preventive services. Please take the time to review along with your updated medication list.  No cancer screening due to age Trying to keep up with exercise Flu vaccine in the fall Had zostavax--hold off on shingrix

## 2019-02-23 NOTE — Progress Notes (Signed)
Subjective:    Patient ID: Jamie Hodges, female    DOB: 20-Feb-1928, 85 y.o.   MRN: [id number]  HPI Here for Medicare wellness and follow up of chronic health conditions Reviewed advanced directives Reviewed other doctors--Dr Manning--dentist, Dr Adalberto Cole Only hospital stay was for the vertigo---this has improved No alcohol or tobacco Vision is fading slightly Hearing aides---they help No falls No depression or anhedonia Still drives and does all instrumental ADLs No sig memory issues  Hasn't been monitoring her BP Hasn't been able to swim--so is walking Does notice easy DOE walking--but did get to go to the pool yesterday and was able to swim without a problem DOE with some housework as well---like vacuuming No dizziness--just some vertigo and she uses the meclizine prn No edema  Still has the diarrhea It is intermittent--uses imodium up to 3 per day when bad Does use 1/2 packet of cholestyramine (more bothers her stomach)  Reviewed labs from hospital GFR ~55  Current Outpatient Medications on File Prior to Visit  Medication Sig Dispense Refill  . amLODipine (NORVASC) 5 MG tablet Take 1 tablet (5 mg total) by mouth daily. 90 tablet 3  . aspirin EC 81 MG tablet Take 81 mg by mouth daily.    . cholestyramine light (PREVALITE) 4 g packet Take 1 packet (4 g total) by mouth 2 (two) times daily. 180 packet 3  . EPINEPHrine 0.3 mg/0.3 mL IJ SOAJ injection Inject 0.3 mg into the muscle as needed (for allergic reaction).    Marland Kitchen loperamide (IMODIUM) 2 MG capsule Take 2 mg by mouth 3 (three) times daily as needed for diarrhea or loose stools.    . meclizine (ANTIVERT) 25 MG tablet TAKE 1 TABLET BY MOUTH THREE TIMES DAILY AS NEEDED FOR NAUSEA OR DIZZINESS. (Patient taking differently: Take 25 mg by mouth 3 (three) times daily as needed for dizziness. ) 60 tablet 2  . metoprolol (TOPROL-XL) 200 MG 24 hr tablet Take 1 tablet by mouth once daily 30 tablet 1  . ramipril (ALTACE) 10  MG capsule TAKE 1 CAPSULE DAILY (Patient taking differently: Take 10 mg by mouth daily. ) 90 capsule 3   No current facility-administered medications on file prior to visit.     Allergies  Allergen Reactions  . Codeine Sulfate     REACTION: rash/ welps  . Crestor [Rosuvastatin Calcium]     myalgia  . Penicillins Hives and Rash    Has patient had a PCN reaction causing immediate rash, facial/tongue/throat swelling, SOB or lightheadedness with hypotension: Yes Has patient had a PCN reaction causing severe rash involving mucus membranes or skin necrosis: No Has patient had a PCN reaction that required hospitalization: No Has patient had a PCN reaction occurring within the last 10 years: Yes If all of the above answers are "NO", then may proceed with Cephalosporin use.    Past Medical History:  Diagnosis Date  . Atrial fibrillation ([place]) 2010   One time during hospital while sick with severe diarrhea  . C. difficile colitis    3/10  Severe C. dif ---had brief atrial fib then. Cath shows some blockages but no intervention  . CAD (coronary artery disease) 2010   minor blockages --no intervention indicated  . Hx of colonic polyps   . Hyperlipidemia   . Hypertension     Past Surgical History:  Procedure Laterality Date  . CHOLECYSTECTOMY  3/16  . ERCP N/A 01/14/2015   Procedure: ENDOSCOPIC RETROGRADE CHOLANGIOPANCREATOGRAPHY (ERCP);  Surgeon: Lucilla Lame, MD;  Location: [facility] ENDOSCOPY;  Service: Endoscopy;  Laterality: N/A;  . TONSILLECTOMY AND ADENOIDECTOMY      Family History  Problem Relation Age of Onset  . COPD Mother   . Coronary artery disease Neg Hx   . Diabetes Neg Hx   . Cancer Neg Hx        breast or colon cancer    Social History   Socioeconomic History  . Marital status: Married    Spouse name: Not on file  . Number of children: 3  . Years of education: Not on file  . Highest education level: Not on file  Occupational History  . Occupation: retired Management consultant  Social Needs  . Financial resource strain: Not on file  . Food insecurity    Worry: Not on file    Inability: Not on file  . Transportation needs    Medical: Not on file    Non-medical: Not on file  Tobacco Use  . Smoking status: Former Smoker    Quit date: 08/16/1978    Years since quitting: 40.5  . Smokeless tobacco: Never Used  Substance and Sexual Activity  . Alcohol use: Never    Frequency: Never  . Drug use: No  . Sexual activity: Not on file  Lifestyle  . Physical activity    Days per week: Not on file    Minutes per session: Not on file  . Stress: Not on file  Relationships  . Social Herbalist on phone: Not on file    Gets together: Not on file    Attends religious service: Not on file    Active member of club or organization: Not on file    Attends meetings of clubs or organizations: Not on file    Relationship status: Not on file  . Intimate partner violence    Fear of current or ex partner: Not on file    Emotionally abused: Not on file    Physically abused: Not on file    Forced sexual activity: Not on file  Other Topics Concern  . Not on file  Social History Narrative   Has living will.    Son Mikki Santee (MD) to make health care decisions.--then son Laurey Arrow   Has DNR order in past and requests again--done   No tube feeds if cognitively unaware         Review of Systems Hasn't had recent eye exam Appetite is fine Weight is stable Sleeps well Wears seat belt No blood in stool Does have urinary frequency and incontinence--uses pad No skin rash or lesions No sig back or joint pain    Objective:   Physical Exam  Constitutional: She is oriented to person, place, and time. She appears well-developed. No distress.  HENT:  Mouth/Throat: Oropharynx is clear and moist. No oropharyngeal exudate.  Neck: No thyromegaly present.  Cardiovascular: Normal rate, regular rhythm and normal heart sounds. Exam reveals no gallop.  No murmur heard.  Pedal pulses faint or absent  Respiratory: Effort normal and breath sounds normal. No respiratory distress. She has no wheezes. She has no rales.  GI: Soft. There is no abdominal tenderness.  Musculoskeletal:        General: No tenderness or edema.     Comments: Purplish discoloration in left foot/toes > right  Lymphadenopathy:    She has no cervical adenopathy.  Neurological: She is alert and oriented to person, place, and time.  President--- "Sheela Stack" [id number], no 81---"obviously that  is not right" D-l-r-o-w Recall 3/3  Skin: No rash noted. No erythema.  Psychiatric: She has a normal mood and affect. Her behavior is normal.           Assessment & Plan:

## 2019-02-23 NOTE — Progress Notes (Signed)
Hearing Screening   125Hz  250Hz  500Hz  1000Hz  2000Hz  3000Hz  4000Hz  6000Hz  8000Hz   Right ear:           Left ear:           Comments: Has hearing aids. Wearing them today.   Visual Acuity Screening   Right eye Left eye Both eyes  Without correction: 20/40 20/25 20/20   With correction:

## 2019-02-23 NOTE — Assessment & Plan Note (Signed)
Now only occasionally The meclizine helps

## 2019-02-23 NOTE — Assessment & Plan Note (Signed)
Has DNR 

## 2019-02-23 NOTE — Assessment & Plan Note (Signed)
BP Readings from Last 3 Encounters:  02/23/19 128/80  08/21/18 130/82  06/29/18 136/84   Good control Some DOE but does okay in pool---no suggestive of CAD

## 2019-02-28 ENCOUNTER — Telehealth: Payer: Self-pay | Admitting: Internal Medicine

## 2019-02-28 MED ORDER — AMLODIPINE BESYLATE 5 MG PO TABS: 5.0000 mg | ORAL_TABLET | Freq: Every day | ORAL | 3 refills | Status: DC

## 2019-02-28 MED ORDER — RAMIPRIL 10 MG PO CAPS: 10.0000 mg | ORAL_CAPSULE | Freq: Every day | ORAL | 3 refills | Status: DC

## 2019-02-28 NOTE — Telephone Encounter (Signed)
Best number [phone]  Pt has changed insurance and needs rx send  [facility] (insurance card scanned)  Pt stated [facility] was taken over by [facility]  Amlodipine Ramipril

## 2019-02-28 NOTE — Telephone Encounter (Signed)
Rxs sent to [facility]

## 2019-03-21 ENCOUNTER — Other Ambulatory Visit: Payer: Self-pay | Admitting: Internal Medicine

## 2019-05-08 ENCOUNTER — Telehealth: Payer: Self-pay | Admitting: Internal Medicine

## 2019-05-08 DIAGNOSIS — L821 Other seborrheic keratosis: Secondary | ICD-10-CM | POA: Diagnosis not present

## 2019-05-08 DIAGNOSIS — L300 Nummular dermatitis: Secondary | ICD-10-CM | POA: Diagnosis not present

## 2019-05-08 DIAGNOSIS — B353 Tinea pedis: Secondary | ICD-10-CM | POA: Diagnosis not present

## 2019-05-08 MED ORDER — RAMIPRIL 10 MG PO CAPS: 10.0000 mg | ORAL_CAPSULE | Freq: Every day | ORAL | 3 refills | Status: DC

## 2019-05-08 NOTE — Telephone Encounter (Signed)
Rx sent to [facility] as requested.  

## 2019-05-08 NOTE — Telephone Encounter (Signed)
Patient stated that she is no longer using the [facility] for the RAMIPRIL  She would like this refill sent into the [facility]

## 2019-05-15 DIAGNOSIS — H04123 Dry eye syndrome of bilateral lacrimal glands: Secondary | ICD-10-CM | POA: Diagnosis not present

## 2019-05-23 DIAGNOSIS — H6123 Impacted cerumen, bilateral: Secondary | ICD-10-CM | POA: Diagnosis not present

## 2019-05-23 DIAGNOSIS — J300 Vasomotor rhinitis: Secondary | ICD-10-CM | POA: Diagnosis not present

## 2019-06-10 ENCOUNTER — Other Ambulatory Visit: Payer: Self-pay | Admitting: Internal Medicine

## 2019-07-02 ENCOUNTER — Ambulatory Visit (INDEPENDENT_AMBULATORY_CARE_PROVIDER_SITE_OTHER): Payer: Medicare Other | Admitting: Podiatry

## 2019-07-02 ENCOUNTER — Encounter: Payer: Self-pay | Admitting: Podiatry

## 2019-07-02 ENCOUNTER — Other Ambulatory Visit: Payer: Self-pay

## 2019-07-02 DIAGNOSIS — L03031 Cellulitis of right toe: Secondary | ICD-10-CM | POA: Insufficient documentation

## 2019-07-02 NOTE — Progress Notes (Signed)
This patient presents to the office with chief complaint of a painful big toenail right foot.  She says that it has been painful for approximately 3 to 4 days.  She points to the inside border big toe right foot as the site of the pain.  She states that her toe is getting better but she still has continued pain and discomfort on the inside border big toe right foot.  She presents the office today for an evaluation and treatment.  Vascular  Dorsalis pedis and posterior tibial pulses are palpable  B/L.  Capillary return  WNL.  Temperature gradient is  WNL.  Skin turgor  WNL  Sensorium  Senn Weinstein monofilament wire  WNL. Normal tactile sensation.  Nail Exam  Patient has normal nails with no evidence of bacterial or fungal infection. Redness and swelling and pain at distal aspect medial border right hallux.  Orthopedic  Exam  Muscle tone and muscle strength  WNL.  No limitations of motion feet  B/L.  No crepitus or joint effusion noted.  Foot type is unremarkable and digits show no abnormalities.  Bony prominences are unremarkable.  Skin  No open lesions.  Normal skin texture and turgor.  Paronychia right hallux.    IE.  Incision and drainage medial border right hallux.  Home soak instructions given to patient.  RTC prn.  Gardiner Barefoot DPM

## 2019-07-02 NOTE — Patient Instructions (Signed)

## 2019-07-10 ENCOUNTER — Other Ambulatory Visit: Payer: Self-pay

## 2019-07-11 ENCOUNTER — Encounter: Payer: Self-pay | Admitting: Emergency Medicine

## 2019-07-11 ENCOUNTER — Emergency Department: Payer: No Typology Code available for payment source

## 2019-07-11 ENCOUNTER — Emergency Department
Admission: EM | Admit: 2019-07-11 | Discharge: 2019-07-11 | Disposition: A | Discharge status: Home / Self Care | Payer: No Typology Code available for payment source | Attending: Emergency Medicine | Admitting: Emergency Medicine

## 2019-07-11 ENCOUNTER — Other Ambulatory Visit: Payer: Self-pay

## 2019-07-11 DIAGNOSIS — Z8673 Personal history of transient ischemic attack (TIA), and cerebral infarction without residual deficits: Secondary | ICD-10-CM | POA: Insufficient documentation

## 2019-07-11 DIAGNOSIS — Y92410 Unspecified street and highway as the place of occurrence of the external cause: Secondary | ICD-10-CM | POA: Insufficient documentation

## 2019-07-11 DIAGNOSIS — I129 Hypertensive chronic kidney disease with stage 1 through stage 4 chronic kidney disease, or unspecified chronic kidney disease: Secondary | ICD-10-CM | POA: Insufficient documentation

## 2019-07-11 DIAGNOSIS — Y999 Unspecified external cause status: Secondary | ICD-10-CM | POA: Diagnosis not present

## 2019-07-11 DIAGNOSIS — Y9389 Activity, other specified: Secondary | ICD-10-CM | POA: Diagnosis not present

## 2019-07-11 DIAGNOSIS — I2510 Atherosclerotic heart disease of native coronary artery without angina pectoris: Secondary | ICD-10-CM | POA: Insufficient documentation

## 2019-07-11 DIAGNOSIS — N183 Chronic kidney disease, stage 3 unspecified: Secondary | ICD-10-CM | POA: Diagnosis not present

## 2019-07-11 DIAGNOSIS — S299XXA Unspecified injury of thorax, initial encounter: Secondary | ICD-10-CM | POA: Diagnosis not present

## 2019-07-11 DIAGNOSIS — Z79899 Other long term (current) drug therapy: Secondary | ICD-10-CM | POA: Insufficient documentation

## 2019-07-11 DIAGNOSIS — S298XXA Other specified injuries of thorax, initial encounter: Secondary | ICD-10-CM | POA: Insufficient documentation

## 2019-07-11 DIAGNOSIS — R079 Chest pain, unspecified: Secondary | ICD-10-CM | POA: Diagnosis not present

## 2019-07-11 DIAGNOSIS — Z7982 Long term (current) use of aspirin: Secondary | ICD-10-CM | POA: Insufficient documentation

## 2019-07-11 DIAGNOSIS — Z87891 Personal history of nicotine dependence: Secondary | ICD-10-CM | POA: Insufficient documentation

## 2019-07-11 LAB — COMPREHENSIVE METABOLIC PANEL
ALT: 17 U/L (ref 0–44)
AST: 22 U/L (ref 15–41)
Albumin: 3.9 g/dL (ref 3.5–5.0)
Alkaline Phosphatase: 65 U/L (ref 38–126)
Anion gap: 8 (ref 5–15)
BUN: 26 mg/dL — ABNORMAL HIGH (ref 8–23)
CO2: 26 mmol/L (ref 22–32)
Calcium: 9.1 mg/dL (ref 8.9–10.3)
Chloride: 106 mmol/L (ref 98–111)
Creatinine, Ser: 0.99 mg/dL (ref 0.44–1.00)
GFR calc Af Amer: 59 mL/min — ABNORMAL LOW (ref >60)
GFR calc non Af Amer: 51 mL/min — ABNORMAL LOW (ref >60)
Glucose, Bld: 114 mg/dL — ABNORMAL HIGH (ref 70–99)
Potassium: 4.3 mmol/L (ref 3.5–5.1)
Sodium: 140 mmol/L (ref 135–145)
Total Bilirubin: 1.1 mg/dL (ref 0.3–1.2)
Total Protein: 7.2 g/dL (ref 6.5–8.1)

## 2019-07-11 LAB — CBC WITH DIFFERENTIAL/PLATELET
Abs Immature Granulocytes: 0.07 10*3/uL (ref 0.00–0.07)
Basophils Absolute: 0.1 10*3/uL (ref 0.0–0.1)
Basophils Relative: 1 %
Eosinophils Absolute: 0.4 10*3/uL (ref 0.0–0.5)
Eosinophils Relative: 6 %
HCT: 34.2 % — ABNORMAL LOW (ref 36.0–46.0)
Hemoglobin: 11.8 g/dL — ABNORMAL LOW (ref 12.0–15.0)
Immature Granulocytes: 1 %
Lymphocytes Relative: 19 %
Lymphs Abs: 1.5 10*3/uL (ref 0.7–4.0)
MCH: 32.5 pg (ref 26.0–34.0)
MCHC: 34.5 g/dL (ref 30.0–36.0)
MCV: 94.2 fL (ref 80.0–100.0)
Monocytes Absolute: 0.3 10*3/uL (ref 0.1–1.0)
Monocytes Relative: 4 %
Neutro Abs: 5.3 10*3/uL (ref 1.7–7.7)
Neutrophils Relative %: 69 %
Platelets: 161 10*3/uL (ref 150–400)
RBC: 3.63 MIL/uL — ABNORMAL LOW (ref 3.87–5.11)
RDW: 12.8 % (ref 11.5–15.5)
WBC: 7.7 10*3/uL (ref 4.0–10.5)
nRBC: 0 % (ref 0.0–0.2)

## 2019-07-11 LAB — TROPONIN I (HIGH SENSITIVITY)
Troponin I (High Sensitivity): 7 ng/L (ref <18)
Troponin I (High Sensitivity): 9 ng/L (ref <18)

## 2019-07-11 MED ORDER — IOHEXOL 350 MG/ML SOLN
75.0000 mL | Freq: Once | INTRAVENOUS | Status: AC | PRN
  Administered 2019-07-11: 75 mL via INTRAVENOUS
  Filled 2019-07-11: qty 75
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
## 2019-07-11 NOTE — ED Notes (Signed)
BP left arm 196/96  P-76

## 2019-07-11 NOTE — ED Notes (Signed)
BP Right Arm 208/78 P-78

## 2019-07-11 NOTE — Discharge Instructions (Addendum)
Follow-up with your regular doctor if not improving in 3 days.  Return emergency department worsening.  Take Tylenol or ibuprofen for pain as needed.
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
## 2019-07-17 ENCOUNTER — Telehealth: Payer: Self-pay | Admitting: Internal Medicine

## 2019-07-17 NOTE — Telephone Encounter (Signed)
Patient is requesting her prescriptions sent over to a new pharmacy She stated that she is in no hurry but needed them on file with the new pharmacy   She stated the pharmacy is  [facility] She provided a fax number but no phone number Fax- [phone]   Amlodipine Ramipril Metoprolol

## 2019-07-20 ENCOUNTER — Encounter: Payer: Self-pay | Admitting: Internal Medicine

## 2019-07-20 ENCOUNTER — Ambulatory Visit (INDEPENDENT_AMBULATORY_CARE_PROVIDER_SITE_OTHER): Payer: Medicare Other | Admitting: Internal Medicine

## 2019-07-20 ENCOUNTER — Other Ambulatory Visit: Payer: Self-pay

## 2019-07-20 DIAGNOSIS — S20219D Contusion of unspecified front wall of thorax, subsequent encounter: Secondary | ICD-10-CM

## 2019-07-20 DIAGNOSIS — I700 Atherosclerosis of aorta: Secondary | ICD-10-CM | POA: Insufficient documentation

## 2019-07-20 DIAGNOSIS — S298XXA Other specified injuries of thorax, initial encounter: Secondary | ICD-10-CM | POA: Insufficient documentation

## 2019-07-20 DIAGNOSIS — S20219A Contusion of unspecified front wall of thorax, initial encounter: Secondary | ICD-10-CM | POA: Insufficient documentation

## 2019-07-20 DIAGNOSIS — I10 Essential (primary) hypertension: Secondary | ICD-10-CM

## 2019-07-20 MED ORDER — METOPROLOL SUCCINATE ER 200 MG PO TB24: 200.0000 mg | ORAL_TABLET | Freq: Every day | ORAL | 3 refills | Status: DC

## 2019-07-20 MED ORDER — AMLODIPINE BESYLATE 5 MG PO TABS: 5.0000 mg | ORAL_TABLET | Freq: Every day | ORAL | 3 refills | Status: DC

## 2019-07-20 MED ORDER — RAMIPRIL 10 MG PO CAPS: 10.0000 mg | ORAL_CAPSULE | Freq: Every day | ORAL | 3 refills | Status: DC

## 2019-07-20 MED ORDER — HYDROCHLOROTHIAZIDE 25 MG PO TABS: 25.0000 mg | ORAL_TABLET | Freq: Every day | ORAL | 3 refills | Status: DC

## 2019-07-20 NOTE — Assessment & Plan Note (Signed)
BP Readings from Last 3 Encounters:  07/20/19 (!) 206/68  07/11/19 (!) 184/89  07/02/19 (!) 190/91   Some concerns about measurement since her palpable systolic is much lower Also her diastolic is low Some of this is pain related Will check at [facility] next week Start HCTZ

## 2019-07-20 NOTE — Telephone Encounter (Signed)
rxs sent to [facility]

## 2019-07-20 NOTE — Patient Instructions (Signed)
Stick to tylenol for the pain---and you can try over the counter lidocaine patch on the sore spots (salon pas is common brand)
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
## 2019-07-20 NOTE — Assessment & Plan Note (Signed)
Seen on CT scan No action for now other than controlling her BP

## 2019-07-20 NOTE — Addendum Note (Signed)
Addended by: Pilar Grammes on: 07/20/2019 12:12 PM   Modules accepted: Orders

## 2019-07-26 ENCOUNTER — Telehealth: Payer: Self-pay | Admitting: Internal Medicine

## 2019-07-26 NOTE — Telephone Encounter (Signed)
Spoke to pt. She expressed understanding.

## 2019-07-26 NOTE — Telephone Encounter (Signed)
She was seen in the office 07-20-19 for her elevated BP. Will defer to Dr Silvio Pate to advise.
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
## 2019-07-26 NOTE — Telephone Encounter (Signed)
I just started the HCTZ and it may take a little while for it to be effective. Since her BP is that high, and she has symptoms---have her take an extra amlodipine today, and any day if her BP is over 170/100 after her other meds

## 2019-08-02 ENCOUNTER — Ambulatory Visit (INDEPENDENT_AMBULATORY_CARE_PROVIDER_SITE_OTHER): Payer: Medicare Other | Admitting: Internal Medicine

## 2019-08-02 ENCOUNTER — Encounter: Payer: Self-pay | Admitting: Internal Medicine

## 2019-08-02 ENCOUNTER — Other Ambulatory Visit: Payer: Self-pay

## 2019-08-02 DIAGNOSIS — I10 Essential (primary) hypertension: Secondary | ICD-10-CM | POA: Diagnosis not present

## 2019-08-02 MED ORDER — AMLODIPINE BESYLATE 5 MG PO TABS: 10.0000 mg | ORAL_TABLET | Freq: Every day | ORAL | 0 refills | Status: DC
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
## 2019-08-02 NOTE — Assessment & Plan Note (Addendum)
BP Readings from Last 3 Encounters:  08/02/19 (!) 158/62  07/20/19 (!) 206/68  07/11/19 (!) 184/89   Much better I am concerned about increasing meds with the low diastolics now No change for now---would have to add another medication Will check renal Recheck 1 month unless she is getting very high numbers at home Might try adding spironolactone if still high

## 2019-08-03 LAB — RENAL FUNCTION PANEL
Albumin: 4.3 g/dL (ref 3.5–5.2)
BUN: 32 mg/dL — ABNORMAL HIGH (ref 6–23)
CO2: 26 mEq/L (ref 19–32)
Calcium: 9.1 mg/dL (ref 8.4–10.5)
Chloride: 104 mEq/L (ref 96–112)
Creatinine, Ser: 1.46 mg/dL — ABNORMAL HIGH (ref 0.40–1.20)
GFR: 33.68 mL/min — ABNORMAL LOW (ref >60.00)
Glucose, Bld: 113 mg/dL — ABNORMAL HIGH (ref 70–99)
Phosphorus: 3.9 mg/dL (ref 2.3–4.6)
Potassium: 5 mEq/L (ref 3.5–5.1)
Sodium: 137 mEq/L (ref 135–145)

## 2019-08-31 DIAGNOSIS — Z23 Encounter for immunization: Secondary | ICD-10-CM | POA: Diagnosis not present

## 2019-09-04 DIAGNOSIS — R21 Rash and other nonspecific skin eruption: Secondary | ICD-10-CM | POA: Diagnosis not present

## 2019-09-04 DIAGNOSIS — B029 Zoster without complications: Secondary | ICD-10-CM | POA: Diagnosis not present

## 2019-09-05 ENCOUNTER — Ambulatory Visit (INDEPENDENT_AMBULATORY_CARE_PROVIDER_SITE_OTHER): Payer: Medicare Other | Admitting: Internal Medicine

## 2019-09-05 ENCOUNTER — Encounter: Payer: Self-pay | Admitting: Internal Medicine

## 2019-09-05 ENCOUNTER — Other Ambulatory Visit: Payer: Self-pay

## 2019-09-05 DIAGNOSIS — I10 Essential (primary) hypertension: Secondary | ICD-10-CM | POA: Diagnosis not present

## 2019-09-05 MED ORDER — AMLODIPINE BESYLATE 5 MG PO TABS: 5.0000 mg | ORAL_TABLET | Freq: Every day | ORAL | 0 refills | Status: DC

## 2019-09-05 NOTE — Progress Notes (Signed)
Subjective:    Patient ID: Jamie Hodges, female    DOB: 02-23-1928, 86 y.o.   MRN: [id number]  HPI Here for follow up of accelerated HTN This visit occurred during the SARS-CoV-2 public health emergency.  Safety protocols were in place, including screening questions prior to the visit, additional usage of staff PPE, and extensive cleaning of exam room while observing appropriate contact time as indicated for disinfecting solutions.   Seen by Dr Nicole Kindred yesterday--diagnosed with shingles Started 3-4 days ago Started on valacyclovir--isolated to lower left abdomen  Checking BP at home---not sure she trusts her machine 157/78, 123456  Lowest systolic 99991111 and most systolics under [id number] recently Is just taking 5mg  of amlodipine  Feels tired Some orthostatic dizziness --but does okay if she sits on the side of the bed before getting up  Current Outpatient Medications on File Prior to Visit  Medication Sig Dispense Refill  . amLODipine (NORVASC) 5 MG tablet Take 2 tablets (10 mg total) by mouth daily. 1 tablet 0  . aspirin EC 81 MG tablet Take 81 mg by mouth daily.    Marland Kitchen EPINEPHrine 0.3 mg/0.3 mL IJ SOAJ injection Inject 0.3 mg into the muscle as needed (for allergic reaction).    . hydrochlorothiazide (HYDRODIURIL) 25 MG tablet Take 1 tablet (25 mg total) by mouth daily. 90 tablet 3  . loperamide (IMODIUM) 2 MG capsule Take 2 mg by mouth 3 (three) times daily as needed for diarrhea or loose stools.    . meclizine (ANTIVERT) 25 MG tablet TAKE 1 TABLET BY MOUTH THREE TIMES DAILY AS NEEDED FOR NAUSEA OR DIZZINESS. (Patient taking differently: Take 25 mg by mouth 3 (three) times daily as needed for dizziness. ) 60 tablet 2  . metoprolol (TOPROL-XL) 200 MG 24 hr tablet Take 1 tablet (200 mg total) by mouth daily. 90 tablet 3  . PREVALITE 4 g packet DISSOLVE ONE PACKET & TAKE BY MOUTH TWICE DAILY 60 each 5  . ramipril (ALTACE) 10 MG capsule Take 1 capsule (10 mg total) by mouth daily. 90 capsule 3    No current facility-administered medications on file prior to visit.    Allergies  Allergen Reactions  . Codeine Sulfate     REACTION: rash/ welps  . Crestor [Rosuvastatin Calcium]     myalgia  . Penicillins Hives and Rash    Has patient had a PCN reaction causing immediate rash, facial/tongue/throat swelling, SOB or lightheadedness with hypotension: Yes Has patient had a PCN reaction causing severe rash involving mucus membranes or skin necrosis: No Has patient had a PCN reaction that required hospitalization: No Has patient had a PCN reaction occurring within the last 10 years: Yes If all of the above answers are "NO", then may proceed with Cephalosporin use.    Past Medical History:  Diagnosis Date  . Atrial fibrillation ([place]) 2010   One time during hospital while sick with severe diarrhea  . C. difficile colitis    3/10  Severe C. dif ---had brief atrial fib then. Cath shows some blockages but no intervention  . CAD (coronary artery disease) 2010   minor blockages --no intervention indicated  . Hx of colonic polyps   . Hyperlipidemia   . Hypertension     Past Surgical History:  Procedure Laterality Date  . CHOLECYSTECTOMY  3/16  . ERCP N/A 01/14/2015   Procedure: ENDOSCOPIC RETROGRADE CHOLANGIOPANCREATOGRAPHY (ERCP);  Surgeon: Lucilla Lame, MD;  Location: [facility] ENDOSCOPY;  Service: Endoscopy;  Laterality: N/A;  . TONSILLECTOMY AND  ADENOIDECTOMY      Family History  Problem Relation Age of Onset  . COPD Mother   . Coronary artery disease Neg Hx   . Diabetes Neg Hx   . Cancer Neg Hx        breast or colon cancer    Social History   Socioeconomic History  . Marital status: Married    Spouse name: Not on file  . Number of children: 3  . Years of education: Not on file  . Highest education level: Not on file  Occupational History  . Occupation: retired Technical sales engineer  Tobacco Use  . Smoking status: Former Smoker    Quit date: 08/16/1978    Years since  quitting: 41.0  . Smokeless tobacco: Never Used  Substance and Sexual Activity  . Alcohol use: Never  . Drug use: No  . Sexual activity: Not on file  Other Topics Concern  . Not on file  Social History Narrative   Has living will.    Son Mikki Santee (MD) to make health care decisions.--then son Laurey Arrow   Has DNR order in past and requests again--done   No tube feeds if cognitively unaware         Social Determinants of Health   Financial Resource Strain:   . Difficulty of Paying Living Expenses: Not on file  Food Insecurity:   . Worried About Charity fundraiser in the Last Year: Not on file  . Ran Out of Food in the Last Year: Not on file  Transportation Needs:   . Lack of Transportation (Medical): Not on file  . Lack of Transportation (Non-Medical): Not on file  Physical Activity:   . Days of Exercise per Week: Not on file  . Minutes of Exercise per Session: Not on file  Stress:   . Feeling of Stress : Not on file  Social Connections:   . Frequency of Communication with Friends and Family: Not on file  . Frequency of Social Gatherings with Friends and Family: Not on file  . Attends Religious Services: Not on file  . Active Member of Clubs or Organizations: Not on file  . Attends Archivist Meetings: Not on file  . Marital Status: Not on file  Intimate Partner Violence:   . Fear of Current or Ex-Partner: Not on file  . Emotionally Abused: Not on file  . Physically Abused: Not on file  . Sexually Abused: Not on file   Review of Systems  No chest pain  Chronic DOE---no change Appetite is fine     Objective:   Physical Exam  Constitutional: She appears well-developed. No distress.  Neck: No thyromegaly present.  Cardiovascular: Normal rate, regular rhythm and normal heart sounds. Exam reveals no gallop.  No murmur heard. Respiratory: Effort normal and breath sounds normal. No respiratory distress. She has no wheezes. She has no rales.  Musculoskeletal:         General: No edema.  Lymphadenopathy:    She has no cervical adenopathy.  Skin:  Several vesicles on red base--LLQ           Assessment & Plan:

## 2019-09-05 NOTE — Assessment & Plan Note (Addendum)
BP Readings from Last 3 Encounters:  09/05/19 (!) 162/68  08/02/19 (!) 158/62  07/20/19 (!) 206/68   Repeat the same on right I feel there may be some pseudohypertension as the palpable BP is lower No increases in medication--especially since diastolics low and she has orthostatic symptoms Some fatigue---not sure if this could be related to the meds (especially beta blocker). No change for now

## 2019-09-18 DIAGNOSIS — H903 Sensorineural hearing loss, bilateral: Secondary | ICD-10-CM | POA: Diagnosis not present

## 2019-09-18 DIAGNOSIS — H6123 Impacted cerumen, bilateral: Secondary | ICD-10-CM | POA: Diagnosis not present

## 2019-09-28 DIAGNOSIS — Z23 Encounter for immunization: Secondary | ICD-10-CM | POA: Diagnosis not present

## 2019-10-06 ENCOUNTER — Other Ambulatory Visit: Payer: Self-pay | Admitting: Internal Medicine

## 2019-10-23 ENCOUNTER — Other Ambulatory Visit: Payer: Self-pay | Admitting: Internal Medicine

## 2019-10-24 NOTE — Telephone Encounter (Signed)
I just wanted to make sure this medication is correct as the previous Rx from 09/06/2019 was sent in with a quantity of #1...Marland Kitchen please advise if refilling for 30 days is appropriate per request

## 2019-12-06 ENCOUNTER — Encounter: Payer: Self-pay | Admitting: Internal Medicine

## 2019-12-06 ENCOUNTER — Other Ambulatory Visit: Payer: Self-pay

## 2019-12-06 ENCOUNTER — Ambulatory Visit (INDEPENDENT_AMBULATORY_CARE_PROVIDER_SITE_OTHER): Payer: Medicare Other | Admitting: Internal Medicine

## 2019-12-06 DIAGNOSIS — I10 Essential (primary) hypertension: Secondary | ICD-10-CM

## 2019-12-06 DIAGNOSIS — R062 Wheezing: Secondary | ICD-10-CM | POA: Diagnosis not present

## 2019-12-06 MED ORDER — ALBUTEROL SULFATE HFA 108 (90 BASE) MCG/ACT IN AERS: 2.0000 | INHALATION_SPRAY | Freq: Four times a day (QID) | RESPIRATORY_TRACT | 1 refills | Status: DC | PRN

## 2019-12-06 NOTE — Patient Instructions (Signed)
Please try taking one of your blood pressure medications at bedtime and the others in the morning. This may help even out your blood pressure control

## 2019-12-06 NOTE — Assessment & Plan Note (Signed)
BP Readings from Last 3 Encounters:  12/06/19 140/82  09/05/19 (!) 162/68  08/02/19 (!) 158/62   Control is better May be high at times in the morning--but okay most of the time Told her to try taking one of the meds at night to separate them

## 2019-12-06 NOTE — Assessment & Plan Note (Signed)
Likely mild COPD but never tested Will try prn albuterol inhaler

## 2019-12-06 NOTE — Progress Notes (Signed)
Subjective:    Patient ID: Jamie Hodges, female    DOB: [birth year], 86 y.o.   MRN: [id number]  HPI Here for follow up of HTN This visit occurred during the SARS-CoV-2 public health emergency.  Safety protocols were in place, including screening questions prior to the visit, additional usage of staff PPE, and extensive cleaning of exam room while observing appropriate contact time as indicated for disinfecting solutions.   Monitoring her BP regularly Takes it in the morning--before her meds (takes them all in the morning) BP runs 124/65 Does have occasional values as high as 165/74  Generally feels okay Still feels tired Hears some wheeze No chest pain Some DOE--if pushes it on walk Does fairly good in the pool Did smoke for an extended time---but quit decades ago  Current Outpatient Medications on File Prior to Visit  Medication Sig Dispense Refill  . amLODipine (NORVASC) 5 MG tablet Take 1 tablet by mouth once daily (Patient taking differently: 10 mg daily. ) 90 tablet 3  . aspirin EC 81 MG tablet Take 81 mg by mouth daily.    Marland Kitchen EPINEPHrine 0.3 mg/0.3 mL IJ SOAJ injection Inject 0.3 mg into the muscle as needed (for allergic reaction).    . hydrochlorothiazide (HYDRODIURIL) 25 MG tablet Take 1 tablet (25 mg total) by mouth daily. 90 tablet 3  . loperamide (IMODIUM) 2 MG capsule Take 2 mg by mouth 3 (three) times daily as needed for diarrhea or loose stools.    . meclizine (ANTIVERT) 25 MG tablet TAKE 1 TABLET BY MOUTH THREE TIMES DAILY AS NEEDED FOR NAUSEA OR DIZZINESS. 60 tablet 0  . metoprolol (TOPROL-XL) 200 MG 24 hr tablet Take 1 tablet (200 mg total) by mouth daily. 90 tablet 3  . ramipril (ALTACE) 10 MG capsule Take 1 capsule (10 mg total) by mouth daily. 90 capsule 3  . PREVALITE 4 g packet DISSOLVE ONE PACKET & TAKE BY MOUTH TWICE DAILY (Patient not taking: Reported on 12/06/2019) 60 each 5   No current facility-administered medications on file prior to visit.     Allergies  Allergen Reactions  . Codeine Sulfate     REACTION: rash/ welps  . Crestor [Rosuvastatin Calcium]     myalgia  . Penicillins Hives and Rash    Has patient had a PCN reaction causing immediate rash, facial/tongue/throat swelling, SOB or lightheadedness with hypotension: Yes Has patient had a PCN reaction causing severe rash involving mucus membranes or skin necrosis: No Has patient had a PCN reaction that required hospitalization: No Has patient had a PCN reaction occurring within the last 10 years: Yes If all of the above answers are "NO", then may proceed with Cephalosporin use.    Past Medical History:  Diagnosis Date  . Atrial fibrillation ([place]) 2010   One time during hospital while sick with severe diarrhea  . C. difficile colitis    3/10  Severe C. dif ---had brief atrial fib then. Cath shows some blockages but no intervention  . CAD (coronary artery disease) 2010   minor blockages --no intervention indicated  . Hx of colonic polyps   . Hyperlipidemia   . Hypertension     Past Surgical History:  Procedure Laterality Date  . CHOLECYSTECTOMY  3/16  . ERCP N/A 01/14/2015   Procedure: ENDOSCOPIC RETROGRADE CHOLANGIOPANCREATOGRAPHY (ERCP);  Surgeon: Lucilla Lame, MD;  Location: [facility] END[facility]COPY;  Service: Endoscopy;  Laterality: N/A;  . TONSILLECTOMY AND ADENOIDECTOMY      Family History  Problem Relation Age  of Onset  . COPD Mother   . Coronary artery disease Neg Hx   . Diabetes Neg Hx   . Cancer Neg Hx        breast or colon cancer    Social History   Socioeconomic History  . Marital status: Married    Spouse name: Not on file  . Number of children: 3  . Years of education: Not on file  . Highest education level: Not on file  Occupational History  . Occupation: retired Technical sales engineer  Tobacco Use  . Smoking status: Former Smoker    Quit date: 08/16/1978    Years since quitting: 41.3  . Smokeless tobacco: Never Used  Substance and Sexual  Activity  . Alcohol use: Never  . Drug use: No  . Sexual activity: Not on file  Other Topics Concern  . Not on file  Social History Narrative   Has living will.    Son Mikki Santee (MD) to make health care decisions.--then son Laurey Arrow   Has DNR order in past and requests again--done   No tube feeds if cognitively unaware         Social Determinants of Health   Financial Resource Strain:   . Difficulty of Paying Living Expenses:   Food Insecurity:   . Worried About Charity fundraiser in the Last Year:   . Arboriculturist in the Last Year:   Transportation Needs:   . Film/video editor (Medical):   Marland Kitchen Lack of Transportation (Non-Medical):   Physical Activity:   . Days of Exercise per Week:   . Minutes of Exercise per Session:   Stress:   . Feeling of Stress :   Social Connections:   . Frequency of Communication with Friends and Family:   . Frequency of Social Gatherings with Friends and Family:   . Attends Religious Services:   . Active Member of Clubs or Organizations:   . Attends Archivist Meetings:   Marland Kitchen Marital Status:   Intimate Partner Violence:   . Fear of Current or Ex-Partner:   . Emotionally Abused:   Marland Kitchen Physically Abused:   . Sexually Abused:    Review of Systems Eating well Sleeps fine--"too much". 9PM -- 6:30AM plus 1 hour nap    Objective:   Physical Exam  Constitutional: No distress.  Cardiovascular: Normal rate, regular rhythm and normal heart sounds. Exam reveals no gallop.  No murmur heard. Respiratory: Effort normal. No respiratory distress. She has no wheezes. She has no rales.  Decreased breath sounds but clear  Musculoskeletal:        General: No edema.           Assessment & Plan:

## 2019-12-31 ENCOUNTER — Ambulatory Visit (INDEPENDENT_AMBULATORY_CARE_PROVIDER_SITE_OTHER): Payer: Medicare Other | Admitting: Dermatology

## 2019-12-31 ENCOUNTER — Other Ambulatory Visit: Payer: Self-pay

## 2019-12-31 DIAGNOSIS — L299 Pruritus, unspecified: Secondary | ICD-10-CM | POA: Diagnosis not present

## 2019-12-31 DIAGNOSIS — L309 Dermatitis, unspecified: Secondary | ICD-10-CM | POA: Diagnosis not present

## 2019-12-31 MED ORDER — MOMETASONE FUROATE 0.1 % EX CREA: 1.0000 "application " | TOPICAL_CREAM | Freq: Two times a day (BID) | CUTANEOUS | 1 refills | Status: DC

## 2019-12-31 NOTE — Progress Notes (Signed)
   Follow-Up Visit   Subjective  Jamie Hodges is a 86 y.o. female who presents for the following: reaction? Itching (face, neck, upper chest, arms ~5 days, itching, Benadryl gel, no new products on face/neck, only thing different she went swimming 2 days prior to itching, no new medications). Rinsed off chlorine.  Not working out in yard.  Uses Oil of Olay soap.  No pets.   The following portions of the chart were reviewed this encounter and updated as appropriate:      Review of Systems:  No other skin or systemic complaints except as noted in HPI or Assessment and Plan.  Objective  Well appearing patient in no apparent distress; mood and affect are within normal limits.  A focused examination was performed including face, neck, chest, arms. Relevant physical exam findings are noted in the Assessment and Plan.  Objective  face, neck, upper chest, arms, hands: Mild erythema with xerotic scale face, neck, upper chest, hands, forearms on photoexposed skin   Assessment & Plan  Pruritus face, neck, upper chest, arms, hands  Dermatitis unclear etiology- Possible Photodermatitis r/t antihypertensives  D/c topical Benadryl  Start Mometasone cr qd/bid aa itchy rash until clear. Start Broad spectrum SPF qd, sample of Aveeno given to pt Start Cerave Anti itch lotion, sample given  mometasone (ELOCON) 0.1 % cream - face, neck, upper chest, arms, hands  Return if symptoms worsen or fail to improve, call office if persistent after 2 wks.  I, Othelia Pulling, RMA, am acting as scribe for Brendolyn Patty, MD .  Documentation: I have reviewed the above documentation for accuracy and completeness, and I agree with the above.  Brendolyn Patty MD

## 2020-01-04 ENCOUNTER — Telehealth: Payer: Self-pay | Admitting: Internal Medicine

## 2020-01-04 MED ORDER — AMLODIPINE BESYLATE 10 MG PO TABS: 10.0000 mg | ORAL_TABLET | Freq: Every day | ORAL | 3 refills | Status: DC

## 2020-01-04 NOTE — Telephone Encounter (Signed)
Sig says 5mg  QD but at the end says 10mg ... please advise which dose is accurate

## 2020-01-04 NOTE — Telephone Encounter (Signed)
Rx sent 

## 2020-01-04 NOTE — Telephone Encounter (Signed)
Patient called in regards to her prescription for amLODipine (NORVASC) 5 MG tablet She stated that she is suppose to be taking 10mg  now instead of 5. She stated that the pharmacy will need a new script for the 10mg    Patient would like it sent to a different pharmacy where she is at    [facility] [address] [place] [address] [phone]

## 2020-02-27 ENCOUNTER — Ambulatory Visit (INDEPENDENT_AMBULATORY_CARE_PROVIDER_SITE_OTHER): Payer: Medicare Other | Admitting: Internal Medicine

## 2020-02-27 ENCOUNTER — Other Ambulatory Visit: Payer: Self-pay

## 2020-02-27 ENCOUNTER — Encounter: Payer: Self-pay | Admitting: Internal Medicine

## 2020-02-27 ENCOUNTER — Telehealth: Payer: Self-pay

## 2020-02-27 DIAGNOSIS — N1832 Chronic kidney disease, stage 3b: Secondary | ICD-10-CM

## 2020-02-27 DIAGNOSIS — Z7189 Other specified counseling: Secondary | ICD-10-CM

## 2020-02-27 DIAGNOSIS — K529 Noninfective gastroenteritis and colitis, unspecified: Secondary | ICD-10-CM | POA: Diagnosis not present

## 2020-02-27 DIAGNOSIS — I700 Atherosclerosis of aorta: Secondary | ICD-10-CM

## 2020-02-27 DIAGNOSIS — Z0000 Encounter for general adult medical examination without abnormal findings: Secondary | ICD-10-CM | POA: Diagnosis not present

## 2020-02-27 DIAGNOSIS — I10 Essential (primary) hypertension: Secondary | ICD-10-CM

## 2020-02-27 LAB — HEPATIC FUNCTION PANEL
ALT: 14 U/L (ref 0–35)
AST: 17 U/L (ref 0–37)
Albumin: 4.6 g/dL (ref 3.5–5.2)
Alkaline Phosphatase: 71 U/L (ref 39–117)
Bilirubin, Direct: 0.1 mg/dL (ref 0.0–0.3)
Total Bilirubin: 0.9 mg/dL (ref 0.2–1.2)
Total Protein: 6.9 g/dL (ref 6.0–8.3)

## 2020-02-27 LAB — RENAL FUNCTION PANEL
Albumin: 4.6 g/dL (ref 3.5–5.2)
BUN: 59 mg/dL — ABNORMAL HIGH (ref 6–23)
CO2: 27 mEq/L (ref 19–32)
Calcium: 9.6 mg/dL (ref 8.4–10.5)
Chloride: 106 mEq/L (ref 96–112)
Creatinine, Ser: 1.64 mg/dL — ABNORMAL HIGH (ref 0.40–1.20)
GFR: 29.41 mL/min — ABNORMAL LOW (ref >60.00)
Glucose, Bld: 114 mg/dL — ABNORMAL HIGH (ref 70–99)
Phosphorus: 4.5 mg/dL (ref 2.3–4.6)
Potassium: 5 mEq/L (ref 3.5–5.1)
Sodium: 140 mEq/L (ref 135–145)

## 2020-02-27 LAB — CBC
HCT: 36.5 % (ref 36.0–46.0)
Hemoglobin: 12.5 g/dL (ref 12.0–15.0)
MCHC: 34.3 g/dL (ref 30.0–36.0)
MCV: 95.7 fl (ref 78.0–100.0)
Platelets: 225 10*3/uL (ref 150.0–400.0)
RBC: 3.82 Mil/uL — ABNORMAL LOW (ref 3.87–5.11)
RDW: 13.7 % (ref 11.5–15.5)
WBC: 6.7 10*3/uL (ref 4.0–10.5)

## 2020-02-27 NOTE — Assessment & Plan Note (Signed)
Will restart prevalite on regular basis

## 2020-02-27 NOTE — Assessment & Plan Note (Signed)
Discussed Given age, will hold off on statin

## 2020-02-27 NOTE — Progress Notes (Signed)
Hearing Screening   125Hz  250Hz  500Hz  1000Hz  2000Hz  3000Hz  4000Hz  6000Hz  8000Hz   Right ear:           Left ear:           Comments: Has bilateral hearing aids. Wearing them today.  Vision Screening Comments: Early 2021

## 2020-02-27 NOTE — Assessment & Plan Note (Signed)
3b CKD On ramipril

## 2020-02-27 NOTE — Assessment & Plan Note (Signed)
Has DNR 

## 2020-02-27 NOTE — Progress Notes (Signed)
Subjective:    Patient ID: Jamie Hodges, female    DOB: 1928/02/03, 86 y.o.   MRN: [id number]  HPI Here for Medicare wellness visit and follow up of chronic health conditions This visit occurred during the SARS-CoV-2 public health emergency.  Safety protocols were in place, including screening questions prior to the visit, additional usage of staff PPE, and extensive cleaning of exam room while observing appropriate contact time as indicated for disinfecting solutions.   Reviewed form and advanced directives Reviewed other doctors No alcohol or tobacco Back to swimming occasionally but awaiting reopening of [facility] pool  BP has settled down  Feels she lacks energy at times Gets SOB at times---like after chores (quickly resolves) Not with swimming No chest pain No dizziness or syncope----just intermittent vertigo (meclizine helps)  Loose stools many days Uses imodium 2 bid at times Does have normal solid stools inbetween Inconsistent with prevalite--discussed (bad taste)  Known aortic and coronary atherosclerosis Discussed statin  Current Outpatient Medications on File Prior to Visit  Medication Sig Dispense Refill  . albuterol (VENTOLIN HFA) 108 (90 Base) MCG/ACT inhaler Inhale 2 puffs into the lungs every 6 (six) hours as needed for wheezing or shortness of breath. 18 g 1  . amLODipine (NORVASC) 10 MG tablet Take 1 tablet (10 mg total) by mouth daily. 90 tablet 3  . aspirin EC 81 MG tablet Take 81 mg by mouth daily.    Marland Kitchen EPINEPHrine 0.3 mg/0.3 mL IJ SOAJ injection Inject 0.3 mg into the muscle as needed (for allergic reaction).    . hydrochlorothiazide (HYDRODIURIL) 25 MG tablet Take 1 tablet (25 mg total) by mouth daily. 90 tablet 3  . loperamide (IMODIUM) 2 MG capsule Take 2 mg by mouth 3 (three) times daily as needed for diarrhea or loose stools.    . meclizine (ANTIVERT) 25 MG tablet TAKE 1 TABLET BY MOUTH THREE TIMES DAILY AS NEEDED FOR NAUSEA OR DIZZINESS. 60 tablet 0    . metoprolol (TOPROL-XL) 200 MG 24 hr tablet Take 1 tablet (200 mg total) by mouth daily. 90 tablet 3  . mometasone (ELOCON) 0.1 % cream Apply 1 application topically in the morning and at bedtime. Bid aa itchy rash on face, neck, chest and arms until clear 45 g 1  . PREVALITE 4 g packet DISSOLVE ONE PACKET & TAKE BY MOUTH TWICE DAILY 60 each 5  . ramipril (ALTACE) 10 MG capsule Take 1 capsule (10 mg total) by mouth daily. 90 capsule 3   No current facility-administered medications on file prior to visit.    Allergies  Allergen Reactions  . Codeine Sulfate     REACTION: rash/ welps  . Crestor [Rosuvastatin Calcium]     myalgia  . Penicillins Hives and Rash    Has patient had a PCN reaction causing immediate rash, facial/tongue/throat swelling, SOB or lightheadedness with hypotension: Yes Has patient had a PCN reaction causing severe rash involving mucus membranes or skin necrosis: No Has patient had a PCN reaction that required hospitalization: No Has patient had a PCN reaction occurring within the last 10 years: Yes If all of the above answers are "NO", then may proceed with Cephalosporin use.    Past Medical History:  Diagnosis Date  . Atrial fibrillation ([place]) 2010   One time during hospital while sick with severe diarrhea  . C. difficile colitis    3/10  Severe C. dif ---had brief atrial fib then. Cath shows some blockages but no intervention  . CAD (  coronary artery disease) 2010   minor blockages --no intervention indicated  . Hx of colonic polyps   . Hyperlipidemia   . Hypertension     Past Surgical History:  Procedure Laterality Date  . CHOLECYSTECTOMY  3/16  . ERCP N/A 01/14/2015   Procedure: ENDOSCOPIC RETROGRADE CHOLANGIOPANCREATOGRAPHY (ERCP);  Surgeon: Lucilla Lame, MD;  Location: [facility] ENDOSCOPY;  Service: Endoscopy;  Laterality: N/A;  . TONSILLECTOMY AND ADENOIDECTOMY      Family History  Problem Relation Age of Onset  . COPD Mother   . Coronary artery  disease Neg Hx   . Diabetes Neg Hx   . Cancer Neg Hx        breast or colon cancer    Social History   Socioeconomic History  . Marital status: Married    Spouse name: Not on file  . Number of children: 3  . Years of education: Not on file  . Highest education level: Not on file  Occupational History  . Occupation: retired Technical sales engineer  Tobacco Use  . Smoking status: Former Smoker    Quit date: 08/16/1978    Years since quitting: 41.5  . Smokeless tobacco: Never Used  Substance and Sexual Activity  . Alcohol use: Never  . Drug use: No  . Sexual activity: Not on file  Other Topics Concern  . Not on file  Social History Narrative   Has living will.    Son Mikki Santee (MD) to make health care decisions.--then son Laurey Arrow   Has DNR order in past and requests again--done   No tube feeds if cognitively unaware         Social Determinants of Health   Financial Resource Strain:   . Difficulty of Paying Living Expenses:   Food Insecurity:   . Worried About Charity fundraiser in the Last Year:   . Arboriculturist in the Last Year:   Transportation Needs:   . Film/video editor (Medical):   Marland Kitchen Lack of Transportation (Non-Medical):   Physical Activity:   . Days of Exercise per Week:   . Minutes of Exercise per Session:   Stress:   . Feeling of Stress :   Social Connections:   . Frequency of Communication with Friends and Family:   . Frequency of Social Gatherings with Friends and Family:   . Attends Religious Services:   . Active Member of Clubs or Organizations:   . Attends Archivist Meetings:   Marland Kitchen Marital Status:   Intimate Partner Violence:   . Fear of Current or Ex-Partner:   . Emotionally Abused:   Marland Kitchen Physically Abused:   . Sexually Abused:    Review of Systems Appetite is fine Weight stable Sleeps fairly well Wears seat belt Teeth okay---keeps up with dentist No suspicious skin lesion now Voids okay---some frequency and incontinence (wears pad) No  sig back or joint pains No heartburn or dysphagia    Objective:   Physical Exam Constitutional:      Appearance: Normal appearance.  HENT:     Head: Normocephalic and atraumatic.     Mouth/Throat:     Mouth: Mucous membranes are moist.     Comments: No lesions Cardiovascular:     Rate and Rhythm: Normal rate and regular rhythm.     Pulses: Normal pulses.     Heart sounds: No murmur heard.  No gallop.   Pulmonary:     Effort: Pulmonary effort is normal.     Breath sounds:  Normal breath sounds. No wheezing or rales.  Abdominal:     Palpations: Abdomen is soft.     Tenderness: There is no abdominal tenderness.  Musculoskeletal:     Cervical back: Neck supple.     Right lower leg: No edema.     Left lower leg: No edema.  Lymphadenopathy:     Cervical: No cervical adenopathy.  Skin:    General: Skin is warm.     Findings: No rash.  Neurological:     Mental Status: She is alert and oriented to person, place, and time.     Comments: President-- "Biden, Trump, Obama" [id number] Preferred not to do other parts  Psychiatric:        Mood and Affect: Mood normal.        Behavior: Behavior normal.            Assessment & Plan:

## 2020-02-27 NOTE — Assessment & Plan Note (Signed)
BP Readings from Last 3 Encounters:  02/27/20 120/70  12/06/19 140/82  09/05/19 (!) 162/68   Control is good now on amlodpine, diuretic and metoprolol, ramilpril

## 2020-02-27 NOTE — Assessment & Plan Note (Signed)
I have personally reviewed the Medicare Annual Wellness questionnaire and have noted 1. The patient's medical and social history 2. Their use of alcohol, tobacco or illicit drugs 3. Their current medications and supplements 4. The patient's functional ability including ADL's, fall risks, home safety risks and hearing or visual             impairment. 5. Diet and physical activities 6. Evidence for depression or mood disorders  The patients weight, height, BMI and visual acuity have been recorded in the chart I have made referrals, counseling and provided education to the patient based review of the above and I have provided the pt with a written personalized care plan for preventive services.  I have provided you with a copy of your personalized plan for preventive services. Please take the time to review along with your updated medication list.  No cancer screening Flu vaccine in the fall Needs to get back to her swimming

## 2020-02-27 NOTE — Telephone Encounter (Signed)
[place] Night - Client Nonclinical Telephone Record AccessNurse Client [facility] Night - Client Client Site [place] Physician Viviana Simpler- MD Contact Type Call Who Is Calling Patient / Member / Family / Caregiver Caller Name [name] Caller Phone Number [phone] Patient Name Jamie Hodges Patient DOB [id number] Call Type Message Only Information Provided Reason for Call Request for General Office Information Initial Comment caller states she needs to confirm the time of her appt tomorrow at 1030. Disp. Time Disposition Final User 02/26/2020 5:42:42 PM General Information Provided Yes Jowers, April Call Closed By: April Jowers Transaction Date/Time: 02/26/2020 5:40:41 PM (ET)

## 2020-03-17 ENCOUNTER — Other Ambulatory Visit: Payer: Self-pay

## 2020-03-17 ENCOUNTER — Telehealth: Payer: Self-pay

## 2020-03-17 ENCOUNTER — Encounter: Payer: Self-pay | Admitting: Internal Medicine

## 2020-03-17 ENCOUNTER — Ambulatory Visit (INDEPENDENT_AMBULATORY_CARE_PROVIDER_SITE_OTHER): Payer: Medicare Other | Admitting: Internal Medicine

## 2020-03-17 DIAGNOSIS — M25551 Pain in right hip: Secondary | ICD-10-CM | POA: Insufficient documentation

## 2020-03-17 MED ORDER — PREDNISONE 20 MG PO TABS: 40.0000 mg | ORAL_TABLET | Freq: Every day | ORAL | 0 refills | Status: DC

## 2020-03-17 NOTE — Telephone Encounter (Signed)
Okay Will evaluate her at the visit

## 2020-03-17 NOTE — Telephone Encounter (Signed)
Called pt back and offered her the 230 spot as we had a late cancellation. She is on her way.

## 2020-03-17 NOTE — Patient Instructions (Signed)
You can continue the aleve--- 1-2 twice a day for the next week or so. (take daily omeprazole 20mg  daily on an empty stomach while you are taking the aleve)

## 2020-03-17 NOTE — Assessment & Plan Note (Signed)
Findings not consistent with arthritis--for the most part Most consistent with bursitis  Will have her try ice Okay to use aleve short term---with omeprazole Will give short course of steroids

## 2020-03-17 NOTE — Telephone Encounter (Signed)
Patient contacted the office and states that she is having pain in her right hip. Patient states that she really wants to be seen today. I advised that there was no availability today with our providers - patient states she only lives a minute away and she will just walk in and see who can see her. I advised that we could not guarantee that she could be seen, but I would send a message to Dr. Silvio Pate to see if he could advise if there was anywhere we could place her on his schedule today?

## 2020-03-17 NOTE — Progress Notes (Signed)
Subjective:    Patient ID: Jamie Hodges, female    DOB: 1928/04/26, 86 y.o.   MRN: [id number]  HPI Here due to right hip pain This visit occurred during the SARS-CoV-2 public health emergency.  Safety protocols were in place, including screening questions prior to the visit, additional usage of staff PPE, and extensive cleaning of exam room while observing appropriate contact time as indicated for disinfecting solutions.   Having pain along lateral right hip and down thigh behind knee Only if up and moving--walking No particular problem with stairs  Sudden onset of pain for 3-4 days No injury or fall Aleve --up to 3 at a time--- helps some Hard to find a comfortable position in bed  Current Outpatient Medications on File Prior to Visit  Medication Sig Dispense Refill  . albuterol (VENTOLIN HFA) 108 (90 Base) MCG/ACT inhaler Inhale 2 puffs into the lungs every 6 (six) hours as needed for wheezing or shortness of breath. 18 g 1  . amLODipine (NORVASC) 10 MG tablet Take 1 tablet (10 mg total) by mouth daily. 90 tablet 3  . aspirin EC 81 MG tablet Take 81 mg by mouth daily.    Marland Kitchen EPINEPHrine 0.3 mg/0.3 mL IJ SOAJ injection Inject 0.3 mg into the muscle as needed (for allergic reaction).    . hydrochlorothiazide (HYDRODIURIL) 25 MG tablet Take 1 tablet (25 mg total) by mouth daily. 90 tablet 3  . loperamide (IMODIUM) 2 MG capsule Take 2 mg by mouth 3 (three) times daily as needed for diarrhea or loose stools.    . meclizine (ANTIVERT) 25 MG tablet TAKE 1 TABLET BY MOUTH THREE TIMES DAILY AS NEEDED FOR NAUSEA OR DIZZINESS. 60 tablet 0  . metoprolol (TOPROL-XL) 200 MG 24 hr tablet Take 1 tablet (200 mg total) by mouth daily. 90 tablet 3  . mometasone (ELOCON) 0.1 % cream Apply 1 application topically in the morning and at bedtime. Bid aa itchy rash on face, neck, chest and arms until clear 45 g 1  . PREVALITE 4 g packet DISSOLVE ONE PACKET & TAKE BY MOUTH TWICE DAILY 60 each 5  . ramipril  (ALTACE) 10 MG capsule Take 1 capsule (10 mg total) by mouth daily. 90 capsule 3   No current facility-administered medications on file prior to visit.    Allergies  Allergen Reactions  . Codeine Sulfate     REACTION: rash/ welps  . Crestor [Rosuvastatin Calcium]     myalgia  . Penicillins Hives and Rash    Has patient had a PCN reaction causing immediate rash, facial/tongue/throat swelling, SOB or lightheadedness with hypotension: Yes Has patient had a PCN reaction causing severe rash involving mucus membranes or skin necrosis: No Has patient had a PCN reaction that required hospitalization: No Has patient had a PCN reaction occurring within the last 10 years: Yes If all of the above answers are "NO", then may proceed with Cephalosporin use.    Past Medical History:  Diagnosis Date  . Atrial fibrillation ([place]) 2010   One time during hospital while sick with severe diarrhea  . C. difficile colitis    3/10  Severe C. dif ---had brief atrial fib then. Cath shows some blockages but no intervention  . CAD (coronary artery disease) 2010   minor blockages --no intervention indicated  . Hx of colonic polyps   . Hyperlipidemia   . Hypertension     Past Surgical History:  Procedure Laterality Date  . CHOLECYSTECTOMY  3/16  . ERCP  N/A 01/14/2015   Procedure: ENDOSCOPIC RETROGRADE CHOLANGIOPANCREATOGRAPHY (ERCP);  Surgeon: Lucilla Lame, MD;  Location: [facility] ENDOSCOPY;  Service: Endoscopy;  Laterality: N/A;  . TONSILLECTOMY AND ADENOIDECTOMY      Family History  Problem Relation Age of Onset  . COPD Mother   . Coronary artery disease Neg Hx   . Diabetes Neg Hx   . Cancer Neg Hx        breast or colon cancer    Social History   Socioeconomic History  . Marital status: Married    Spouse name: Not on file  . Number of children: 3  . Years of education: Not on file  . Highest education level: Not on file  Occupational History  . Occupation: retired Technical sales engineer  Tobacco  Use  . Smoking status: Former Smoker    Quit date: 08/16/1978    Years since quitting: 41.6  . Smokeless tobacco: Never Used  Substance and Sexual Activity  . Alcohol use: Never  . Drug use: No  . Sexual activity: Not on file  Other Topics Concern  . Not on file  Social History Narrative   Has living will.    Son Mikki Santee (MD) to make health care decisions.--then son Laurey Arrow   Has DNR order in past and requests again--done   No tube feeds if cognitively unaware         Social Determinants of Health   Financial Resource Strain:   . Difficulty of Paying Living Expenses:   Food Insecurity:   . Worried About Charity fundraiser in the Last Year:   . Arboriculturist in the Last Year:   Transportation Needs:   . Film/video editor (Medical):   Marland Kitchen Lack of Transportation (Non-Medical):   Physical Activity:   . Days of Exercise per Week:   . Minutes of Exercise per Session:   Stress:   . Feeling of Stress :   Social Connections:   . Frequency of Communication with Friends and Family:   . Frequency of Social Gatherings with Friends and Family:   . Attends Religious Services:   . Active Member of Clubs or Organizations:   . Attends Archivist Meetings:   Marland Kitchen Marital Status:   Intimate Partner Violence:   . Fear of Current or Ex-Partner:   . Emotionally Abused:   Marland Kitchen Physically Abused:   . Sexually Abused:    Review of Systems  No fever No sick     Objective:   Physical Exam Musculoskeletal:     Comments: Vague right hip tenderness---seems below bursa laterally Mild pain with passive internal and external rotation---only mild restriction in movement  Neurological:     Comments: Mildly antalgic gait at first---then steadied out and was quicker Mild weakness right hip flexors--seems to be from pain            Assessment & Plan:

## 2020-03-21 ENCOUNTER — Telehealth: Payer: Self-pay

## 2020-03-21 MED ORDER — PREDNISONE 20 MG PO TABS: 40.0000 mg | ORAL_TABLET | Freq: Every day | ORAL | 0 refills | Status: DC

## 2020-03-21 NOTE — Telephone Encounter (Signed)
Left detailed message on VM per DPR. 

## 2020-03-21 NOTE — Telephone Encounter (Signed)
Pt dropped off a note about her bursitis symptoms. She says it is better but she is walking with a limp. She will finish the prednisone tomorrow. Asking if that can be extended as discussed. Send to [place] if agreed. Let pt know what Dr Silvio Pate decides.

## 2020-03-21 NOTE — Telephone Encounter (Signed)
Please let her know that I did refill the prednisone---she may just want to take one a day for the duration.  If she has ongoing symptoms next week, we can consider PT at [facility]

## 2020-05-02 ENCOUNTER — Other Ambulatory Visit: Payer: Self-pay | Admitting: Internal Medicine

## 2020-06-06 ENCOUNTER — Other Ambulatory Visit: Payer: Self-pay | Admitting: Internal Medicine

## 2020-06-17 NOTE — Telephone Encounter (Signed)
Pt seen 02/27/20.

## 2020-06-25 ENCOUNTER — Other Ambulatory Visit: Payer: Self-pay | Admitting: Internal Medicine

## 2020-07-01 DIAGNOSIS — Z23 Encounter for immunization: Secondary | ICD-10-CM | POA: Diagnosis not present

## 2020-07-07 ENCOUNTER — Other Ambulatory Visit: Payer: Self-pay | Admitting: Internal Medicine

## 2020-08-11 ENCOUNTER — Other Ambulatory Visit: Payer: Self-pay | Admitting: Internal Medicine

## 2020-08-22 DIAGNOSIS — H02135 Senile ectropion of left lower eyelid: Secondary | ICD-10-CM | POA: Diagnosis not present

## 2020-09-04 ENCOUNTER — Other Ambulatory Visit: Payer: Self-pay | Admitting: Internal Medicine

## 2020-09-06 ENCOUNTER — Other Ambulatory Visit: Payer: Self-pay | Admitting: Internal Medicine

## 2020-09-11 DIAGNOSIS — H02132 Senile ectropion of right lower eyelid: Secondary | ICD-10-CM | POA: Diagnosis not present

## 2020-10-04 ENCOUNTER — Other Ambulatory Visit: Payer: Self-pay | Admitting: Internal Medicine

## 2020-10-09 DIAGNOSIS — H02135 Senile ectropion of left lower eyelid: Secondary | ICD-10-CM | POA: Diagnosis not present

## 2020-11-05 ENCOUNTER — Telehealth: Payer: Self-pay | Admitting: Internal Medicine

## 2020-11-05 MED ORDER — AMLODIPINE BESYLATE 10 MG PO TABS: 10.0000 mg | ORAL_TABLET | Freq: Every day | ORAL | 3 refills | Status: DC

## 2020-11-05 NOTE — Telephone Encounter (Signed)
  LAST APPOINTMENT DATE: 10/04/2020   NEXT APPOINTMENT DATE:@7 /20/2022  MEDICATION: AMLODIPINE  PHARMACY: [facility]   Let patient know to contact pharmacy at the end of the day to make sure medication is ready.  Please notify patient to allow 48-72 hours to process  Encourage patient to contact the pharmacy for refills or they can request refills through [facility]:   LAST REFILL:  QTY:  REFILL DATE:    OTHER COMMENTS:    Okay for refill?  Please advise

## 2020-11-05 NOTE — Telephone Encounter (Signed)
Rx sent electronically.  

## 2020-11-06 DIAGNOSIS — H02132 Senile ectropion of right lower eyelid: Secondary | ICD-10-CM | POA: Diagnosis not present

## 2021-01-01 DIAGNOSIS — Z23 Encounter for immunization: Secondary | ICD-10-CM | POA: Diagnosis not present

## 2021-02-09 DIAGNOSIS — H6123 Impacted cerumen, bilateral: Secondary | ICD-10-CM | POA: Diagnosis not present

## 2021-02-09 DIAGNOSIS — H903 Sensorineural hearing loss, bilateral: Secondary | ICD-10-CM | POA: Diagnosis not present

## 2021-02-13 ENCOUNTER — Other Ambulatory Visit: Payer: Self-pay | Admitting: Internal Medicine

## 2021-02-17 ENCOUNTER — Inpatient Hospital Stay
Admission: EM | Admit: 2021-02-17 | Discharge: 2021-02-23 | DRG: 394 | Disposition: A | Discharge status: Skilled Nursing Facility | Payer: Medicare Other | Attending: Internal Medicine | Admitting: Internal Medicine

## 2021-02-17 ENCOUNTER — Other Ambulatory Visit: Payer: Self-pay

## 2021-02-17 DIAGNOSIS — Z20822 Contact with and (suspected) exposure to covid-19: Secondary | ICD-10-CM | POA: Diagnosis not present

## 2021-02-17 DIAGNOSIS — Z515 Encounter for palliative care: Secondary | ICD-10-CM | POA: Diagnosis not present

## 2021-02-17 DIAGNOSIS — K6389 Other specified diseases of intestine: Secondary | ICD-10-CM | POA: Diagnosis present

## 2021-02-17 DIAGNOSIS — E872 Acidosis: Secondary | ICD-10-CM | POA: Diagnosis present

## 2021-02-17 DIAGNOSIS — R52 Pain, unspecified: Secondary | ICD-10-CM | POA: Diagnosis not present

## 2021-02-17 DIAGNOSIS — Z888 Allergy status to other drugs, medicaments and biological substances status: Secondary | ICD-10-CM

## 2021-02-17 DIAGNOSIS — D638 Anemia in other chronic diseases classified elsewhere: Secondary | ICD-10-CM

## 2021-02-17 DIAGNOSIS — K559 Vascular disorder of intestine, unspecified: Principal | ICD-10-CM | POA: Diagnosis present

## 2021-02-17 DIAGNOSIS — K551 Chronic vascular disorders of intestine: Secondary | ICD-10-CM | POA: Diagnosis not present

## 2021-02-17 DIAGNOSIS — Z880 Allergy status to penicillin: Secondary | ICD-10-CM

## 2021-02-17 DIAGNOSIS — N1831 Chronic kidney disease, stage 3a: Secondary | ICD-10-CM | POA: Diagnosis present

## 2021-02-17 DIAGNOSIS — E785 Hyperlipidemia, unspecified: Secondary | ICD-10-CM | POA: Diagnosis present

## 2021-02-17 DIAGNOSIS — R531 Weakness: Secondary | ICD-10-CM

## 2021-02-17 DIAGNOSIS — Z79899 Other long term (current) drug therapy: Secondary | ICD-10-CM

## 2021-02-17 DIAGNOSIS — Z885 Allergy status to narcotic agent status: Secondary | ICD-10-CM

## 2021-02-17 DIAGNOSIS — R55 Syncope and collapse: Secondary | ICD-10-CM

## 2021-02-17 DIAGNOSIS — Z66 Do not resuscitate: Secondary | ICD-10-CM | POA: Diagnosis present

## 2021-02-17 DIAGNOSIS — W19XXXA Unspecified fall, initial encounter: Secondary | ICD-10-CM

## 2021-02-17 DIAGNOSIS — R059 Cough, unspecified: Secondary | ICD-10-CM

## 2021-02-17 DIAGNOSIS — R1084 Generalized abdominal pain: Secondary | ICD-10-CM | POA: Diagnosis not present

## 2021-02-17 DIAGNOSIS — K5730 Diverticulosis of large intestine without perforation or abscess without bleeding: Secondary | ICD-10-CM | POA: Diagnosis not present

## 2021-02-17 DIAGNOSIS — N179 Acute kidney failure, unspecified: Secondary | ICD-10-CM | POA: Diagnosis present

## 2021-02-17 DIAGNOSIS — I2510 Atherosclerotic heart disease of native coronary artery without angina pectoris: Secondary | ICD-10-CM | POA: Diagnosis present

## 2021-02-17 DIAGNOSIS — R110 Nausea: Secondary | ICD-10-CM | POA: Diagnosis not present

## 2021-02-17 DIAGNOSIS — I129 Hypertensive chronic kidney disease with stage 1 through stage 4 chronic kidney disease, or unspecified chronic kidney disease: Secondary | ICD-10-CM | POA: Diagnosis present

## 2021-02-17 DIAGNOSIS — D631 Anemia in chronic kidney disease: Secondary | ICD-10-CM | POA: Diagnosis present

## 2021-02-17 DIAGNOSIS — Z7982 Long term (current) use of aspirin: Secondary | ICD-10-CM

## 2021-02-17 DIAGNOSIS — R0602 Shortness of breath: Secondary | ICD-10-CM

## 2021-02-17 DIAGNOSIS — Z91018 Allergy to other foods: Secondary | ICD-10-CM

## 2021-02-17 DIAGNOSIS — E861 Hypovolemia: Secondary | ICD-10-CM | POA: Diagnosis present

## 2021-02-17 DIAGNOSIS — K56609 Unspecified intestinal obstruction, unspecified as to partial versus complete obstruction: Secondary | ICD-10-CM | POA: Diagnosis not present

## 2021-02-17 DIAGNOSIS — I10 Essential (primary) hypertension: Secondary | ICD-10-CM | POA: Diagnosis not present

## 2021-02-17 DIAGNOSIS — Z87891 Personal history of nicotine dependence: Secondary | ICD-10-CM

## 2021-02-17 DIAGNOSIS — I480 Paroxysmal atrial fibrillation: Secondary | ICD-10-CM | POA: Diagnosis present

## 2021-02-17 LAB — COMPREHENSIVE METABOLIC PANEL
ALT: 15 U/L (ref 0–44)
AST: 19 U/L (ref 15–41)
Albumin: 4.2 g/dL (ref 3.5–5.0)
Alkaline Phosphatase: 73 U/L (ref 38–126)
Anion gap: 8 (ref 5–15)
BUN: 47 mg/dL — ABNORMAL HIGH (ref 8–23)
CO2: 23 mmol/L (ref 22–32)
Calcium: 9.3 mg/dL (ref 8.9–10.3)
Chloride: 107 mmol/L (ref 98–111)
Creatinine, Ser: 1.95 mg/dL — ABNORMAL HIGH (ref 0.44–1.00)
GFR, Estimated: 24 mL/min — ABNORMAL LOW (ref >60)
Glucose, Bld: 150 mg/dL — ABNORMAL HIGH (ref 70–99)
Potassium: 4.8 mmol/L (ref 3.5–5.1)
Sodium: 138 mmol/L (ref 135–145)
Total Bilirubin: 1 mg/dL (ref 0.3–1.2)
Total Protein: 7.4 g/dL (ref 6.5–8.1)

## 2021-02-17 LAB — CBC
HCT: 35.7 % — ABNORMAL LOW (ref 36.0–46.0)
Hemoglobin: 12.4 g/dL (ref 12.0–15.0)
MCH: 32.6 pg (ref 26.0–34.0)
MCHC: 34.7 g/dL (ref 30.0–36.0)
MCV: 93.9 fL (ref 80.0–100.0)
Platelets: 210 10*3/uL (ref 150–400)
RBC: 3.8 MIL/uL — ABNORMAL LOW (ref 3.87–5.11)
RDW: 12.6 % (ref 11.5–15.5)
WBC: 7.5 10*3/uL (ref 4.0–10.5)
nRBC: 0 % (ref 0.0–0.2)

## 2021-02-17 LAB — LIPASE, BLOOD: Lipase: 32 U/L (ref 11–51)

## 2021-02-17 NOTE — ED Triage Notes (Signed)
See first nurse note- pt with complaints of nausea and epigastric pain. Pt also experiencing vertigo. Denies urinary symptoms.

## 2021-02-17 NOTE — ED Triage Notes (Signed)
Pt comes into the ED via EMS from home with c/o upper abd pain for the past 2 hrs with nausea,  67HR 98%RA 157/74

## 2021-02-18 ENCOUNTER — Emergency Department: Payer: Medicare Other

## 2021-02-18 DIAGNOSIS — Z515 Encounter for palliative care: Secondary | ICD-10-CM | POA: Diagnosis not present

## 2021-02-18 DIAGNOSIS — Z87891 Personal history of nicotine dependence: Secondary | ICD-10-CM | POA: Diagnosis not present

## 2021-02-18 DIAGNOSIS — Z20822 Contact with and (suspected) exposure to covid-19: Secondary | ICD-10-CM | POA: Diagnosis present

## 2021-02-18 DIAGNOSIS — G238 Other specified degenerative diseases of basal ganglia: Secondary | ICD-10-CM | POA: Diagnosis not present

## 2021-02-18 DIAGNOSIS — N183 Chronic kidney disease, stage 3 unspecified: Secondary | ICD-10-CM | POA: Diagnosis not present

## 2021-02-18 DIAGNOSIS — N1832 Chronic kidney disease, stage 3b: Secondary | ICD-10-CM | POA: Diagnosis not present

## 2021-02-18 DIAGNOSIS — K529 Noninfective gastroenteritis and colitis, unspecified: Secondary | ICD-10-CM | POA: Diagnosis not present

## 2021-02-18 DIAGNOSIS — N179 Acute kidney failure, unspecified: Secondary | ICD-10-CM

## 2021-02-18 DIAGNOSIS — D72829 Elevated white blood cell count, unspecified: Secondary | ICD-10-CM

## 2021-02-18 DIAGNOSIS — I480 Paroxysmal atrial fibrillation: Secondary | ICD-10-CM

## 2021-02-18 DIAGNOSIS — N189 Chronic kidney disease, unspecified: Secondary | ICD-10-CM | POA: Insufficient documentation

## 2021-02-18 DIAGNOSIS — M6281 Muscle weakness (generalized): Secondary | ICD-10-CM | POA: Diagnosis not present

## 2021-02-18 DIAGNOSIS — I10 Essential (primary) hypertension: Secondary | ICD-10-CM | POA: Diagnosis not present

## 2021-02-18 DIAGNOSIS — K56609 Unspecified intestinal obstruction, unspecified as to partial versus complete obstruction: Secondary | ICD-10-CM | POA: Diagnosis not present

## 2021-02-18 DIAGNOSIS — R55 Syncope and collapse: Secondary | ICD-10-CM | POA: Diagnosis present

## 2021-02-18 DIAGNOSIS — E785 Hyperlipidemia, unspecified: Secondary | ICD-10-CM | POA: Diagnosis present

## 2021-02-18 DIAGNOSIS — Z043 Encounter for examination and observation following other accident: Secondary | ICD-10-CM | POA: Diagnosis not present

## 2021-02-18 DIAGNOSIS — E872 Acidosis, unspecified: Secondary | ICD-10-CM | POA: Insufficient documentation

## 2021-02-18 DIAGNOSIS — I129 Hypertensive chronic kidney disease with stage 1 through stage 4 chronic kidney disease, or unspecified chronic kidney disease: Secondary | ICD-10-CM | POA: Diagnosis present

## 2021-02-18 DIAGNOSIS — S0990XA Unspecified injury of head, initial encounter: Secondary | ICD-10-CM | POA: Diagnosis not present

## 2021-02-18 DIAGNOSIS — Z885 Allergy status to narcotic agent status: Secondary | ICD-10-CM | POA: Diagnosis not present

## 2021-02-18 DIAGNOSIS — Z79899 Other long term (current) drug therapy: Secondary | ICD-10-CM | POA: Diagnosis not present

## 2021-02-18 DIAGNOSIS — K55032 Diffuse acute (reversible) ischemia of large intestine: Secondary | ICD-10-CM | POA: Diagnosis not present

## 2021-02-18 DIAGNOSIS — Z741 Need for assistance with personal care: Secondary | ICD-10-CM | POA: Diagnosis not present

## 2021-02-18 DIAGNOSIS — R531 Weakness: Secondary | ICD-10-CM | POA: Diagnosis not present

## 2021-02-18 DIAGNOSIS — R278 Other lack of coordination: Secondary | ICD-10-CM | POA: Diagnosis not present

## 2021-02-18 DIAGNOSIS — N1831 Chronic kidney disease, stage 3a: Secondary | ICD-10-CM | POA: Diagnosis present

## 2021-02-18 DIAGNOSIS — K55039 Acute (reversible) ischemia of large intestine, extent unspecified: Secondary | ICD-10-CM | POA: Diagnosis not present

## 2021-02-18 DIAGNOSIS — R197 Diarrhea, unspecified: Secondary | ICD-10-CM | POA: Diagnosis not present

## 2021-02-18 DIAGNOSIS — R110 Nausea: Secondary | ICD-10-CM | POA: Diagnosis not present

## 2021-02-18 DIAGNOSIS — Z7982 Long term (current) use of aspirin: Secondary | ICD-10-CM | POA: Diagnosis not present

## 2021-02-18 DIAGNOSIS — R933 Abnormal findings on diagnostic imaging of other parts of digestive tract: Secondary | ICD-10-CM | POA: Diagnosis not present

## 2021-02-18 DIAGNOSIS — D638 Anemia in other chronic diseases classified elsewhere: Secondary | ICD-10-CM | POA: Diagnosis not present

## 2021-02-18 DIAGNOSIS — Z880 Allergy status to penicillin: Secondary | ICD-10-CM | POA: Diagnosis not present

## 2021-02-18 DIAGNOSIS — M79632 Pain in left forearm: Secondary | ICD-10-CM | POA: Diagnosis not present

## 2021-02-18 DIAGNOSIS — R0602 Shortness of breath: Secondary | ICD-10-CM | POA: Diagnosis not present

## 2021-02-18 DIAGNOSIS — I4891 Unspecified atrial fibrillation: Secondary | ICD-10-CM | POA: Diagnosis not present

## 2021-02-18 DIAGNOSIS — D649 Anemia, unspecified: Secondary | ICD-10-CM | POA: Diagnosis not present

## 2021-02-18 DIAGNOSIS — K559 Vascular disorder of intestine, unspecified: Secondary | ICD-10-CM | POA: Diagnosis present

## 2021-02-18 DIAGNOSIS — Z888 Allergy status to other drugs, medicaments and biological substances status: Secondary | ICD-10-CM | POA: Diagnosis not present

## 2021-02-18 DIAGNOSIS — I2510 Atherosclerotic heart disease of native coronary artery without angina pectoris: Secondary | ICD-10-CM | POA: Diagnosis present

## 2021-02-18 DIAGNOSIS — K551 Chronic vascular disorders of intestine: Secondary | ICD-10-CM | POA: Diagnosis not present

## 2021-02-18 DIAGNOSIS — G319 Degenerative disease of nervous system, unspecified: Secondary | ICD-10-CM | POA: Diagnosis not present

## 2021-02-18 DIAGNOSIS — R2681 Unsteadiness on feet: Secondary | ICD-10-CM | POA: Diagnosis not present

## 2021-02-18 DIAGNOSIS — K6389 Other specified diseases of intestine: Secondary | ICD-10-CM

## 2021-02-18 DIAGNOSIS — D631 Anemia in chronic kidney disease: Secondary | ICD-10-CM | POA: Diagnosis present

## 2021-02-18 DIAGNOSIS — E861 Hypovolemia: Secondary | ICD-10-CM | POA: Diagnosis present

## 2021-02-18 DIAGNOSIS — Z91018 Allergy to other foods: Secondary | ICD-10-CM | POA: Diagnosis not present

## 2021-02-18 DIAGNOSIS — Z66 Do not resuscitate: Secondary | ICD-10-CM | POA: Diagnosis present

## 2021-02-18 DIAGNOSIS — K5730 Diverticulosis of large intestine without perforation or abscess without bleeding: Secondary | ICD-10-CM | POA: Diagnosis not present

## 2021-02-18 LAB — COMPREHENSIVE METABOLIC PANEL
ALT: 19 U/L (ref 0–44)
AST: 30 U/L (ref 15–41)
Albumin: 3.4 g/dL — ABNORMAL LOW (ref 3.5–5.0)
Alkaline Phosphatase: 64 U/L (ref 38–126)
Anion gap: 9 (ref 5–15)
BUN: 54 mg/dL — ABNORMAL HIGH (ref 8–23)
CO2: 21 mmol/L — ABNORMAL LOW (ref 22–32)
Calcium: 8.5 mg/dL — ABNORMAL LOW (ref 8.9–10.3)
Chloride: 108 mmol/L (ref 98–111)
Creatinine, Ser: 2.14 mg/dL — ABNORMAL HIGH (ref 0.44–1.00)
GFR, Estimated: 21 mL/min — ABNORMAL LOW (ref >60)
Glucose, Bld: 152 mg/dL — ABNORMAL HIGH (ref 70–99)
Potassium: 4.8 mmol/L (ref 3.5–5.1)
Sodium: 138 mmol/L (ref 135–145)
Total Bilirubin: 1 mg/dL (ref 0.3–1.2)
Total Protein: 6.1 g/dL — ABNORMAL LOW (ref 6.5–8.1)

## 2021-02-18 LAB — RESP PANEL BY RT-PCR (FLU A&B, COVID) ARPGX2
Influenza A by PCR: NEGATIVE
Influenza B by PCR: NEGATIVE
SARS Coronavirus 2 by RT PCR: NEGATIVE

## 2021-02-18 LAB — CBC
HCT: 34 % — ABNORMAL LOW (ref 36.0–46.0)
Hemoglobin: 11.5 g/dL — ABNORMAL LOW (ref 12.0–15.0)
MCH: 32.6 pg (ref 26.0–34.0)
MCHC: 33.8 g/dL (ref 30.0–36.0)
MCV: 96.3 fL (ref 80.0–100.0)
Platelets: 191 10*3/uL (ref 150–400)
RBC: 3.53 MIL/uL — ABNORMAL LOW (ref 3.87–5.11)
RDW: 12.7 % (ref 11.5–15.5)
WBC: 20.1 10*3/uL — ABNORMAL HIGH (ref 4.0–10.5)
nRBC: 0 % (ref 0.0–0.2)

## 2021-02-18 LAB — APTT: aPTT: 24 seconds (ref 24–36)

## 2021-02-18 LAB — URINALYSIS, COMPLETE (UACMP) WITH MICROSCOPIC
Bacteria, UA: NONE SEEN
Bilirubin Urine: NEGATIVE
Glucose, UA: NEGATIVE mg/dL
Hgb urine dipstick: NEGATIVE
Ketones, ur: NEGATIVE mg/dL
Nitrite: NEGATIVE
Protein, ur: NEGATIVE mg/dL
Specific Gravity, Urine: 1.018 (ref 1.005–1.030)
pH: 5 (ref 5.0–8.0)

## 2021-02-18 LAB — TYPE AND SCREEN
ABO/RH(D): A POS
Antibody Screen: NEGATIVE

## 2021-02-18 LAB — LACTIC ACID, PLASMA: Lactic Acid, Venous: 2 mmol/L (ref 0.5–1.9)

## 2021-02-18 LAB — HEPARIN LEVEL (UNFRACTIONATED)
Heparin Unfractionated: 0.38 IU/mL (ref 0.30–0.70)
Heparin Unfractionated: 0.3 IU/mL (ref 0.30–0.70)

## 2021-02-18 LAB — PROTIME-INR
INR: 1 (ref 0.8–1.2)
Prothrombin Time: 13.2 seconds (ref 11.4–15.2)

## 2021-02-18 LAB — TROPONIN I (HIGH SENSITIVITY): Troponin I (High Sensitivity): 6 ng/L (ref <18)

## 2021-02-18 MED ORDER — ALBUTEROL SULFATE (2.5 MG/3ML) 0.083% IN NEBU: 2.5000 mg | INHALATION_SOLUTION | Freq: Four times a day (QID) | RESPIRATORY_TRACT | Status: DC | PRN

## 2021-02-18 MED ORDER — METOPROLOL SUCCINATE ER 100 MG PO TB24: 200.0000 mg | ORAL_TABLET | Freq: Every day | ORAL | Status: DC

## 2021-02-18 MED ORDER — METOPROLOL SUCCINATE ER 100 MG PO TB24: 100.0000 mg | ORAL_TABLET | Freq: Every day | ORAL | Status: DC

## 2021-02-18 MED ORDER — FENTANYL CITRATE (PF) 100 MCG/2ML IJ SOLN
50.0000 ug | Freq: Once | INTRAMUSCULAR | Status: AC
Start: 2021-02-18 — End: 2021-02-18
  Administered 2021-02-18: 50 ug via INTRAVENOUS
  Filled 2021-02-18: qty 2

## 2021-02-18 MED ORDER — SODIUM CHLORIDE 0.9 % IV BOLUS (SEPSIS): 500.0000 mL | Freq: Once | INTRAVENOUS | Status: DC

## 2021-02-18 MED ORDER — ACETAMINOPHEN 325 MG PO TABS
650.0000 mg | ORAL_TABLET | Freq: Four times a day (QID) | ORAL | Status: DC | PRN
  Administered 2021-02-20 – 2021-02-22 (×2): 650 mg via ORAL
  Filled 2021-02-18 (×2): qty 2

## 2021-02-18 MED ORDER — SODIUM CHLORIDE 0.9 % IV SOLN
2.0000 g | INTRAVENOUS | Status: DC
  Administered 2021-02-19 – 2021-02-22 (×4): 2 g via INTRAVENOUS
  Filled 2021-02-18: qty 2
  Filled 2021-02-18: qty 20
  Filled 2021-02-18 (×2): qty 2

## 2021-02-18 MED ORDER — SODIUM CHLORIDE 0.9 % IV BOLUS (SEPSIS)
500.0000 mL | Freq: Once | INTRAVENOUS | Status: AC
  Administered 2021-02-18: 500 mL via INTRAVENOUS

## 2021-02-18 MED ORDER — SODIUM CHLORIDE 0.9 % IV SOLN
INTRAVENOUS | Status: DC
  Administered 2021-02-18: 125 mL via INTRAVENOUS

## 2021-02-18 MED ORDER — SODIUM CHLORIDE 0.9 % IV SOLN
1.0000 g | INTRAVENOUS | Status: DC
  Administered 2021-02-18: 1 g via INTRAVENOUS
  Filled 2021-02-18: qty 10

## 2021-02-18 MED ORDER — METRONIDAZOLE 500 MG/100ML IV SOLN
500.0000 mg | Freq: Once | INTRAVENOUS | Status: AC
  Administered 2021-02-18: 500 mg via INTRAVENOUS
  Filled 2021-02-18: qty 100

## 2021-02-18 MED ORDER — CIPROFLOXACIN IN D5W 400 MG/200ML IV SOLN
400.0000 mg | Freq: Once | INTRAVENOUS | Status: AC
  Administered 2021-02-18: 400 mg via INTRAVENOUS
  Filled 2021-02-18: qty 200

## 2021-02-18 MED ORDER — METRONIDAZOLE 500 MG/100ML IV SOLN
500.0000 mg | Freq: Three times a day (TID) | INTRAVENOUS | Status: DC
  Administered 2021-02-18 – 2021-02-23 (×16): 500 mg via INTRAVENOUS
  Filled 2021-02-18 (×19): qty 100

## 2021-02-18 MED ORDER — TRAZODONE HCL 50 MG PO TABS: 25.0000 mg | ORAL_TABLET | Freq: Every evening | ORAL | Status: DC | PRN

## 2021-02-18 MED ORDER — EPINEPHRINE 0.3 MG/0.3ML IJ SOAJ
0.3000 mg | INTRAMUSCULAR | Status: DC | PRN
  Filled 2021-02-18: qty 0.3

## 2021-02-18 MED ORDER — MECLIZINE HCL 25 MG PO TABS
12.5000 mg | ORAL_TABLET | Freq: Three times a day (TID) | ORAL | Status: DC | PRN
  Filled 2021-02-18: qty 0.5
  Filled 2021-02-18 (×2): qty 1

## 2021-02-18 MED ORDER — FENTANYL CITRATE (PF) 100 MCG/2ML IJ SOLN
50.0000 ug | INTRAMUSCULAR | Status: DC | PRN
  Administered 2021-02-18: 50 ug via INTRAVENOUS
  Filled 2021-02-18: qty 2

## 2021-02-18 MED ORDER — ACETAMINOPHEN 650 MG RE SUPP: 650.0000 mg | Freq: Four times a day (QID) | RECTAL | Status: DC | PRN

## 2021-02-18 MED ORDER — ONDANSETRON HCL 4 MG/2ML IJ SOLN
4.0000 mg | Freq: Once | INTRAMUSCULAR | Status: AC
  Administered 2021-02-18: 4 mg via INTRAVENOUS
  Filled 2021-02-18: qty 2

## 2021-02-18 MED ORDER — ONDANSETRON HCL 4 MG PO TABS: 4.0000 mg | ORAL_TABLET | Freq: Four times a day (QID) | ORAL | Status: DC | PRN

## 2021-02-18 MED ORDER — ONDANSETRON HCL 4 MG/2ML IJ SOLN
4.0000 mg | Freq: Four times a day (QID) | INTRAMUSCULAR | Status: DC | PRN
  Administered 2021-02-19 – 2021-02-20 (×2): 4 mg via INTRAVENOUS
  Filled 2021-02-18 (×4): qty 2

## 2021-02-18 MED ORDER — HEPARIN (PORCINE) 25000 UT/250ML-% IV SOLN
1150.0000 [IU]/h | INTRAVENOUS | Status: AC
  Administered 2021-02-18 – 2021-02-19 (×2): 800 [IU]/h via INTRAVENOUS
  Administered 2021-02-20: 1000 [IU]/h via INTRAVENOUS
  Filled 2021-02-18 (×3): qty 250

## 2021-02-18 MED ORDER — AMLODIPINE BESYLATE 10 MG PO TABS: 10.0000 mg | ORAL_TABLET | Freq: Every day | ORAL | Status: DC

## 2021-02-18 MED ORDER — SODIUM CHLORIDE 0.9 % IV SOLN: INTRAVENOUS | Status: DC

## 2021-02-18 MED ORDER — MORPHINE SULFATE (PF) 2 MG/ML IV SOLN
2.0000 mg | INTRAVENOUS | Status: DC | PRN
  Administered 2021-02-18 – 2021-02-19 (×6): 2 mg via INTRAVENOUS
  Filled 2021-02-18 (×7): qty 1

## 2021-02-18 MED ORDER — AMLODIPINE BESYLATE 5 MG PO TABS
5.0000 mg | ORAL_TABLET | Freq: Every day | ORAL | Status: DC
  Administered 2021-02-19: 5 mg via ORAL
  Filled 2021-02-18: qty 1

## 2021-02-18 MED ORDER — HEPARIN BOLUS VIA INFUSION
4000.0000 [IU] | Freq: Once | INTRAVENOUS | Status: AC
  Administered 2021-02-18: 4000 [IU] via INTRAVENOUS
  Filled 2021-02-18: qty 4000

## 2021-02-18 MED ORDER — ONDANSETRON HCL 4 MG/2ML IJ SOLN
4.0000 mg | Freq: Four times a day (QID) | INTRAMUSCULAR | Status: DC | PRN
  Administered 2021-02-18 (×3): 4 mg via INTRAVENOUS
  Filled 2021-02-18: qty 2

## 2021-02-18 NOTE — ED Notes (Signed)
Heparin lab result resulted back from lab. The order at this time does not look like a titratable drip. MD notified. Per MD pharmacy working on reviewing the order.

## 2021-02-18 NOTE — H&P (Signed)
[place]   PATIENT NAME: Jamie Hodges    MR#:  [id number]  DATE OF BIRTH:  April 12, 1928  DATE OF ADMISSION:  02/17/2021  PRIMARY CARE PHYSICIAN: [name], MD   Patient is coming from: Home  REQUESTING/REFERRING PHYSICIAN: Ward, Delice Bison, DO  CHIEF COMPLAINT:   Chief Complaint  Patient presents with   Abdominal Pain    HISTORY OF PRESENT ILLNESS:  Jamie Hodges is a 87 y.o. Caucasian female with medical history significant for hypertension, dyslipidemia, paroxysmal atrial fibrillation on aspirin, coronary artery disease and C. difficile colitis, who presents to the emergency room with acute onset of abdominal pain with associated nausea and vomiting over the last 3 days with no fever or chills.  The patient denies any coffee-ground emesis or bloody vomitus.  No melena or bright red bleeding per rectum.  No dysuria, oliguria or hematuria or flank pain.  No dyspnea or cough or wheezing hemoptysis.  No other bleeding diathesis.  ED Course: Upon presentation to the emergency room vital signs were within normal.  Labs revealed a BUN of 47 and creatinine 1.95 compared to 59/1.64.  Lactic acid was 2 and CBC was unremarkable.  Influenza antigens and COVID-19 PCR came back negative.  EKG as reviewed by me : Showed normal sinus rhythm with rate 67 Imaging: Abdominal pelvic CT scan revealed: Right colonic wall thickening with colonic pneumatosis and pericolonic edema. Extensive portal venous gas demonstrated in the liver. Changes are likely to represent colonic ischemia. Although there is significant aortic atherosclerosis, only minimal calcification is demonstrated in the superior mesenteric artery. Consider possible mesenteric venous thrombus or arterial embolus, entities which would not be visualized on noncontrast imaging.  Contact was made with Dr. Hampton Abbot and Dr. Delana Meyer.  The patient and family including her son who is an OB/GYN physician elected conservative management  but wanted to treatment and if there is lack of improvement they may consider comfort measures.  PAST MEDICAL HISTORY:   Past Medical History:  Diagnosis Date   Atrial fibrillation ([place]) 2010   One time during hospital while sick with severe diarrhea   C. difficile colitis    3/10  Severe C. dif ---had brief atrial fib then. Cath shows some blockages but no intervention   CAD (coronary artery disease) 2010   minor blockages --no intervention indicated   Hx of colonic polyps    Hyperlipidemia    Hypertension     PAST SURGICAL HISTORY:   Past Surgical History:  Procedure Laterality Date   CHOLECYSTECTOMY  3/16   ERCP N/A 01/14/2015   Procedure: ENDOSCOPIC RETROGRADE CHOLANGIOPANCREATOGRAPHY (ERCP);  Surgeon: Lucilla Lame, MD;  Location: [facility] ENDOSCOPY;  Service: Endoscopy;  Laterality: N/A;   TONSILLECTOMY AND ADENOIDECTOMY      SOCIAL HISTORY:   Social History   Tobacco Use   Smoking status: Former    Pack years: 0.00    Types: Cigarettes    Quit date: 08/16/1978    Years since quitting: 42.5   Smokeless tobacco: Never  Substance Use Topics   Alcohol use: Never    FAMILY HISTORY:   Family History  Problem Relation Age of Onset   COPD Mother    Coronary artery disease Neg Hx    Diabetes Neg Hx    Cancer Neg Hx        breast or colon cancer    DRUG ALLERGIES:   Allergies  Allergen Reactions   Codeine Sulfate     REACTION: rash/  welps   Other     Wild rice   Crestor [Rosuvastatin Calcium]     myalgia   Penicillins Hives and Rash    Has patient had a PCN reaction causing immediate rash, facial/tongue/throat swelling, SOB or lightheadedness with hypotension: Yes Has patient had a PCN reaction causing severe rash involving mucus membranes or skin necrosis: No Has patient had a PCN reaction that required hospitalization: No Has patient had a PCN reaction occurring within the last 10 years: Yes If all of the above answers are "NO", then may proceed with  Cephalosporin use.    REVIEW OF SYSTEMS:   ROS As per history of present illness. All pertinent systems were reviewed above. Constitutional, HEENT, cardiovascular, respiratory, GI, GU, musculoskeletal, neuro, psychiatric, endocrine, integumentary and hematologic systems were reviewed and are otherwise negative/unremarkable except for positive findings mentioned above in the HPI.   MEDICATIONS AT HOME:   Prior to Admission medications   Medication Sig Start Date End Date Taking? Authorizing Provider  acetaminophen (TYLENOL) 325 MG tablet Take 650 mg by mouth every 6 (six) hours as needed.   Yes [provider]  albuterol (VENTOLIN HFA) 108 (90 Base) MCG/ACT inhaler Inhale 2 puffs into the lungs every 6 (six) hours as needed for wheezing or shortness of breath. 12/06/19  Yes Viviana Simpler I, MD  amLODipine (NORVASC) 10 MG tablet Take 1 tablet (10 mg total) by mouth daily. 11/05/20  Yes Viviana Simpler I, MD  aspirin EC 81 MG tablet Take 81 mg by mouth daily.   Yes [provider]  EPINEPHrine 0.3 mg/0.3 mL IJ SOAJ injection Inject 0.3 mg into the muscle as needed (for allergic reaction).   Yes [provider]  hydrochlorothiazide (HYDRODIURIL) 25 MG tablet Take 1 tablet by mouth once daily 07/07/20  Yes Elby Beck, FNP  ibuprofen (ADVIL) 200 MG tablet Take 200 mg by mouth every 6 (six) hours as needed.   Yes [provider]  loperamide (IMODIUM) 2 MG capsule Take 2 mg by mouth 3 (three) times daily as needed for diarrhea or loose stools.   Yes [provider]  meclizine (ANTIVERT) 25 MG tablet TAKE 1 TABLET BY MOUTH THREE TIMES DAILY AS NEEDED FOR NAUSEA OR DIZZINESS Patient taking differently: Take 25 mg by mouth 3 (three) times daily as needed. 02/17/21  Yes [name], MD  metoprolol (TOPROL-XL) 200 MG 24 hr tablet TAKE ONE TABLET BY MOUTH DAILY Patient taking differently: Take 200 mg by mouth daily. 06/25/20  Yes Viviana Simpler I,  MD  ramipril (ALTACE) 10 MG capsule TAKE ONE CAPSULE BY MOUTH DAILY Patient taking differently: Take 10 mg by mouth daily. 10/06/20  Yes [name], MD  mometasone (ELOCON) 0.1 % cream Apply 1 application topically in the morning and at bedtime. Bid aa itchy rash on face, neck, chest and arms until clear Patient not taking: Reported on 02/18/2021 12/31/19   Brendolyn Patty, MD  predniSONE (DELTASONE) 20 MG tablet Take 2 tablets (40 mg total) by mouth daily. For 3 days, then 1 tab daily for 3 days Patient not taking: Reported on 02/18/2021 03/21/20   [name], MD  PREVALITE 4 g packet DISSOLVE ONE PACKET & TAKE BY MOUTH TWICE DAILY Patient not taking: Reported on 02/18/2021 06/11/19   [name], MD      VITAL SIGNS:  Blood pressure 105/86, pulse 70, temperature 98.2 F (36.8 C), temperature source Oral, resp. rate 16, height 5\' 7"  (1.702 m), weight  68 kg, SpO2 92 %.  PHYSICAL EXAMINATION:  Physical Exam  GENERAL:  87 y.o.-year-old Caucasian female patient lying in the bed with no acute distress.  EYES: Pupils equal, round, reactive to light and accommodation. No scleral icterus. Extraocular muscles intact.  HEENT: Head atraumatic, normocephalic. Oropharynx and nasopharynx clear.  NECK:  Supple, no jugular venous distention. No thyroid enlargement, no tenderness.  LUNGS: Normal breath sounds bilaterally, no wheezing, rales,rhonchi or crepitation. No use of accessory muscles of respiration.  CARDIOVASCULAR: Regular rate and rhythm, S1, S2 normal. No murmurs, rubs, or gallops.  ABDOMEN: Soft, nondistended, with mild generalized tenderness without rebound tenderness guarding or rigidity.  Significant diminished Bowel sounds with no organomegaly or mass.  EXTREMITIES: No pedal edema, cyanosis, or clubbing.  NEUROLOGIC: Cranial nerves II through XII are intact. Muscle strength 5/5 in all extremities. Sensation intact. Gait not checked.  PSYCHIATRIC: The patient is alert and oriented  x 3.  Normal affect and good eye contact. SKIN: No obvious rash, lesion, or ulcer.   LABORATORY PANEL:   CBC Recent Labs  Lab 02/17/21 1823  WBC 7.5  HGB 12.4  HCT 35.7*  PLT 210   ------------------------------------------------------------------------------------------------------------------  Chemistries  Recent Labs  Lab 02/17/21 1823  NA 138  K 4.8  CL 107  CO2 23  GLUCOSE 150*  BUN 47*  CREATININE 1.95*  CALCIUM 9.3  AST 19  ALT 15  ALKPHOS 73  BILITOT 1.0   ------------------------------------------------------------------------------------------------------------------  Cardiac Enzymes No results for input(s): TROPONINI in the last 168 hours. ------------------------------------------------------------------------------------------------------------------  RADIOLOGY:  CT ABDOMEN PELVIS WO CONTRAST  Result Date: 02/18/2021 CLINICAL DATA:  Right lower quadrant abdominal pain. Upper abdominal pain. Nausea. EXAM: CT ABDOMEN AND PELVIS WITHOUT CONTRAST TECHNIQUE: Multidetector CT imaging of the abdomen and pelvis was performed following the standard protocol without IV contrast. COMPARISON:  CT chest 07/11/2019.  CT abdomen and pelvis 10/13/2014 FINDINGS: Lower chest: Lung bases are clear. Hepatobiliary: Diffuse portal venous gas throughout the liver. Surgical absence of the gallbladder. No bile duct dilatation. Pancreas: Unremarkable. No pancreatic ductal dilatation or surrounding inflammatory changes. Spleen: Normal in size without focal abnormality. Adrenals/Urinary Tract: Adrenal glands are unremarkable. Kidneys are normal, without renal calculi, focal lesion, or hydronephrosis. Bladder is unremarkable. Stomach/Bowel: Stomach, small bowel, and colon are not abnormally distended. The ascending colon and proximal transverse colon demonstrate diffuse wall thickening with pericolonic edema and mural pneumatosis. Tiny gas in mesenteric portal veins. Changes are likely to  represent colonic ischemia. Colonic diverticulosis without evidence of diverticulitis. Appendix is not identified. Opaque medication in the colon. Vascular/Lymphatic: Aortic atherosclerosis. No enlarged abdominal or pelvic lymph nodes. Reproductive: Uterus and bilateral adnexa are unremarkable. Other: No free air or free fluid in the abdomen. Abdominal wall musculature appears intact. Musculoskeletal: Degenerative changes in the hips and spine. No destructive bone lesions. IMPRESSION: Right colonic wall thickening with colonic pneumatosis and pericolonic edema. Extensive portal venous gas demonstrated in the liver. Changes are likely to represent colonic ischemia. Although there is significant aortic atherosclerosis, only minimal calcification is demonstrated in the superior mesenteric artery. Consider possible mesenteric venous thrombus or arterial embolus, entities which would not be visualized on noncontrast imaging. Critical Value/emergent results were called by telephone at the time of interpretation on 02/18/2021 at 12:33 am to provider [facility] , who verbally acknowledged these results. Electronically Signed   By: Lucienne Capers M.D.   On: 02/18/2021 00:38      IMPRESSION AND PLAN:  Active Problems:   Ischemic bowel disease (  [place])  1.  Colonic ischemia that could be related to mesenteric possible venous thrombosis or arterial thrombosis. - The patient will be admitted to a previous unit bed. - Pain management will be provided. - We will continue the patient on IV Cipro and Flagyl. - We will continue IV heparin that was started in the ER. - General surgery consult to be obtained as well as vascular surgery consult. - I notified Dr. Hampton Abbot about the patient. - Vascular surgery consult will be obtained. - I notified Dr. Delana Meyer about the patient. - With clinical deterioration patient will likely go for comfort measures.  2.  Acute kidney injury, likely hypovolemic. - The patient will be  hydrated with IV normal saline and will follow BMP. - We will hold off HCTZ and ramipril.  3.  Essential hypertension. - We will continue amlodipine and hold off HCTZ and ramipril given acute kidney injury. -. The patient will be placed on as needed IV labetalol   DVT prophylaxis: IV heparin.   Code Status: The patient is DNR/DNI.   Family Communication:  The plan of care was discussed in details with the patient (and family). I answered all questions. The patient agreed to proceed with the above mentioned plan. Further management will depend upon hospital course. Disposition Plan: Back to previous home environment Consults called: Neurosurgery and vascular surgery consults.   All the records are reviewed and case discussed with ED provider.  Status is: Inpatient  Remains inpatient appropriate because:Ongoing active pain requiring inpatient pain management, Ongoing diagnostic testing needed not appropriate for outpatient work up, Unsafe d/c plan, IV treatments appropriate due to intensity of illness or inability to take PO, and Inpatient level of care appropriate due to severity of illness  Dispo: The patient is from: Home              Anticipated d/c is to: Home              Patient currently is not medically stable to d/c.   Difficult to place patient No   TOTAL TIME TAKING CARE OF THIS PATIENT: 55 minutes.    Christel Mormon M.D on 02/18/2021 at 4:27 AM  [facility]   From 7 PM-7 AM, contact night-coverage [email or web address]  CC: Primary care physician; [name], MD

## 2021-02-18 NOTE — Progress Notes (Signed)
ANTICOAGULATION CONSULT NOTE - Initial Consult  Pharmacy Consult for Heparin  Indication: ischemic right colon c/f potential underlying mesenteric thrombosis.  Allergies  Allergen Reactions   Codeine Sulfate     REACTION: rash/ welps   Other     Wild rice   Crestor [Rosuvastatin Calcium]     myalgia   Penicillins Hives and Rash    Has patient had a PCN reaction causing immediate rash, facial/tongue/throat swelling, SOB or lightheadedness with hypotension: Yes Has patient had a PCN reaction causing severe rash involving mucus membranes or skin necrosis: No Has patient had a PCN reaction that required hospitalization: No Has patient had a PCN reaction occurring within the last 10 years: Yes If all of the above answers are "NO", then may proceed with Cephalosporin use.    Patient Measurements: Height: 5\' 7"  (170.2 cm) Weight: 68 kg (150 lb) IBW/kg (Calculated) : 61.6 Heparin Dosing Weight: 68 kg   Vital Signs: BP: 114/54 (07/06 0700) Pulse Rate: 68 (07/06 0700)  Labs: Recent Labs    02/17/21 1823 02/18/21 0101 02/18/21 0719  HGB 12.4  --  11.5*  HCT 35.7*  --  34.0*  PLT 210  --  191  APTT  --  24  --   LABPROT  --  13.2  --   INR  --  1.0  --   CREATININE 1.95*  --  2.14*  TROPONINIHS 6  --   --      Estimated Creatinine Clearance: 16.7 mL/min (A) (by C-G formula based on SCr of 2.14 mg/dL (H)).   Medical History: Past Medical History:  Diagnosis Date   Atrial fibrillation ([place]) 2010   One time during hospital while sick with severe diarrhea   C. difficile colitis    3/10  Severe C. dif ---had brief atrial fib then. Cath shows some blockages but no intervention   CAD (coronary artery disease) 2010   minor blockages --no intervention indicated   Hx of colonic polyps    Hyperlipidemia    Hypertension     Medications:  Heparin Dosing Weight: 68 kg   Assessment: Pharmacy consulted to dose heparin in this 87 year old female w/ h/o HTN, HLD, parAFib (on  ASA w/o AC), CAD and C.diff colitis admitted with ischemic right colon and c/f potential underlying mesenteric thrombosis. No prior anticoag noted.  CrCl = 18.3 >16.7 ml/min ;  Date Time aPTT/HL Rate/Comment 07/06 0953 0.38  Thera x1; 800 un/hr      Baseline Labs: aPTT - 24s INR - 1.0 Hgb - 12.4>11.5 Plts - 210>191  troponin 6  Goal of Therapy:  Heparin level 0.3-0.7 units/ml Monitor platelets by anticoagulation protocol: Yes   Plan:  Heparin level therapeutic x1 @0 .38 (07/06 0953). Continue heparin infusion at 800 units/hr. Recheck HL in 8 hours and if consecutively therapeutic then daily while on heparin Continue to monitor H&H and platelets daily   Lorna Dibble 02/18/2021,10:14 AM

## 2021-02-18 NOTE — ED Notes (Signed)
1 assist to restroom. Dyspnea on exertion.

## 2021-02-18 NOTE — Progress Notes (Signed)
Patient ID: Jamie Hodges, female   DOB: 1927-11-05, 87 y.o.   MRN: [id number]  Spoke with son Jamie Hodges on the phone.  I gave an update on his mom's condition.  He also agrees that his mom does not want any surgical intervention at this time.  As needed pain medications.  We will check labs tomorrow morning and reassess.  May want to go back to [facility] with hospice.  We will start clear liquid diet tonight and reassess tomorrow.  Spoke with Dr. Delana Meyer (vascular surgery) earlier today and he said he did not have any procedure to offer.  Dr Loletha Grayer

## 2021-02-18 NOTE — Progress Notes (Addendum)
ANTICOAGULATION CONSULT NOTE - Initial Consult  Pharmacy Consult for Heparin  Indication: chest pain/ACS  Allergies  Allergen Reactions   Codeine Sulfate     REACTION: rash/ welps   Crestor [Rosuvastatin Calcium]     myalgia   Penicillins Hives and Rash    Has patient had a PCN reaction causing immediate rash, facial/tongue/throat swelling, SOB or lightheadedness with hypotension: Yes Has patient had a PCN reaction causing severe rash involving mucus membranes or skin necrosis: No Has patient had a PCN reaction that required hospitalization: No Has patient had a PCN reaction occurring within the last 10 years: Yes If all of the above answers are "NO", then may proceed with Cephalosporin use.    Patient Measurements: Height: 5\' 7"  (170.2 cm) Weight: 68 kg (150 lb) IBW/kg (Calculated) : 61.6 Heparin Dosing Weight: 68 kg   Vital Signs: Temp: 98.2 F (36.8 C) (07/05 1826) Temp Source: Oral (07/05 1826) BP: 147/67 (07/06 0000) Pulse Rate: 68 (07/06 0000)  Labs: Recent Labs    02/17/21 1823  HGB 12.4  HCT 35.7*  PLT 210  CREATININE 1.95*    Estimated Creatinine Clearance: 18.3 mL/min (A) (by C-G formula based on SCr of 1.95 mg/dL (H)).   Medical History: Past Medical History:  Diagnosis Date   Atrial fibrillation ([place]) 2010   One time during hospital while sick with severe diarrhea   C. difficile colitis    3/10  Severe C. dif ---had brief atrial fib then. Cath shows some blockages but no intervention   CAD (coronary artery disease) 2010   minor blockages --no intervention indicated   Hx of colonic polyps    Hyperlipidemia    Hypertension     Medications:  (Not in a hospital admission)   Assessment: Pharmacy consulted to dose heparin in this 87 year old female admitted with ACS/NSTEMI.   No prior anticoag noted.  CrCl = 18.3 ml/min   Goal of Therapy:  Heparin level 0.3-0.7 units/ml Monitor platelets by anticoagulation protocol: Yes   Plan:  Give 4000  units bolus x 1 Start heparin infusion at 800 units/hr Check anti-Xa level in 8 hours and daily while on heparin Continue to monitor H&H and platelets  Fitzpatrick Alberico D 02/18/2021,1:05 AM

## 2021-02-18 NOTE — Progress Notes (Signed)
ANTICOAGULATION CONSULT NOTE - Initial Consult  Pharmacy Consult for Heparin  Indication: ischemic right colon c/f potential underlying mesenteric thrombosis.  Allergies  Allergen Reactions   Codeine Sulfate     REACTION: rash/ welps   Other     Wild rice   Crestor [Rosuvastatin Calcium]     myalgia   Penicillins Hives and Rash    Has patient had a PCN reaction causing immediate rash, facial/tongue/throat swelling, SOB or lightheadedness with hypotension: Yes Has patient had a PCN reaction causing severe rash involving mucus membranes or skin necrosis: No Has patient had a PCN reaction that required hospitalization: No Has patient had a PCN reaction occurring within the last 10 years: Yes If all of the above answers are "NO", then may proceed with Cephalosporin use.    Patient Measurements: Height: 5\' 7"  (170.2 cm) Weight: 68 kg (150 lb) IBW/kg (Calculated) : 61.6 Heparin Dosing Weight: 68 kg   Vital Signs: Temp: 98.2 F (36.8 C) (07/06 1734) Temp Source: Oral (07/06 1734) BP: 157/58 (07/06 1734) Pulse Rate: 91 (07/06 1734)  Labs: Recent Labs    02/17/21 1823 02/18/21 0101 02/18/21 0719 02/18/21 0953 02/18/21 1832  HGB 12.4  --  11.5*  --   --   HCT 35.7*  --  34.0*  --   --   PLT 210  --  191  --   --   APTT  --  24  --   --   --   LABPROT  --  13.2  --   --   --   INR  --  1.0  --   --   --   HEPARINUNFRC  --   --   --  0.38 0.30  CREATININE 1.95*  --  2.14*  --   --   TROPONINIHS 6  --   --   --   --      Estimated Creatinine Clearance: 16.7 mL/min (A) (by C-G formula based on SCr of 2.14 mg/dL (H)).   Medical History: Past Medical History:  Diagnosis Date   Atrial fibrillation ([place]) 2010   One time during hospital while sick with severe diarrhea   C. difficile colitis    3/10  Severe C. dif ---had brief atrial fib then. Cath shows some blockages but no intervention   CAD (coronary artery disease) 2010   minor blockages --no intervention indicated    Hx of colonic polyps    Hyperlipidemia    Hypertension     Medications:  Heparin Dosing Weight: 68 kg   Assessment: Pharmacy consulted to dose heparin in this 87 year old female w/ h/o HTN, HLD, parAFib (on ASA w/o AC), CAD and C.diff colitis admitted with ischemic right colon and c/f potential underlying mesenteric thrombosis. No prior anticoag noted.  CrCl = 18.3 >16.7 ml/min ;  Date Time aPTT/HL Rate/Comment 07/06 0953 0.38  Thera x1; 800 un/hr   07/06 1832 0.30  Thera x 2; 800 units/hr    Baseline Labs: aPTT - 24s INR - 1.0 Hgb - 12.4>11.5 Plts - 210>191  troponin 6  Goal of Therapy:  Heparin level 0.3-0.7 units/ml Monitor platelets by anticoagulation protocol: Yes   Plan:  Heparin level therapeutic  Continue heparin infusion at 800 units/hr. Recheck HL with AM labs  Continue to monitor H&H and platelets daily   Dorothe Pea, PharmD, BCPS Clinical Pharmacist   02/18/2021,7:03 PM

## 2021-02-18 NOTE — Consult Note (Signed)
[facility] SURGICAL CONSULTATION NOTE (initial) - cpt: 99244   HISTORY OF PRESENT ILLNESS (HPI):  87 y.o. female presented to [facility] ED overnight for evaluation of abdominal pain. Patient reports around a 24 hour history of diffuse abdominal pain. This was generalized and unable to localize this. Nothing seemed to make this better or worse. No fever, chills, CP, SOB, nausea, emesis, or urinary changes. She does endorse diarrhea at baseline, no blood. Only previous intra-abdominal surgery is positive for cholecystectomy. She does have a history of PAF and CAD but not on anticoagulation. Work up in the ED revealed a normal WBC of 7.5K, slight bump in baseline renal function with sCr - 1.95, no electrolyte derangements, but she did have a lactic acidosis to 2.0. CT Abdomen/Pelvis was obtained overnight which showed right colonic pneumatosis and thickening with associated edema concerning for ischemic bowel, no free air.   These findings were reviewed extensively with the patient, and her family, last night via EDP. Vascular surgery consult was obtained, and she was started on heparin and IVF resuscitation. Tentatively recommending repeat imaging with contrast if possible. Additionally, our recommendation would be exploratory laparotomy with ileostomy and mucus fistula creation. After review with family and patient, they elected for conservative measures with the understanding this may be a fatal event. Should she clinically deteriorate, they would like to pursue comfort measures. This was corroborated again this morning.   Surgery is consulted by emergency medicine physician Dr. Pryor Curia, DO in this context for evaluation and management of ischemic right colon .  PAST MEDICAL HISTORY (PMH):  Past Medical History:  Diagnosis Date   Atrial fibrillation ([place]) 2010   One time during hospital while sick with severe diarrhea   C. difficile colitis    3/10  Severe C. dif ---had brief  atrial fib then. Cath shows some blockages but no intervention   CAD (coronary artery disease) 2010   minor blockages --no intervention indicated   Hx of colonic polyps    Hyperlipidemia    Hypertension      PAST SURGICAL HISTORY ([place]):  Past Surgical History:  Procedure Laterality Date   CHOLECYSTECTOMY  3/16   ERCP N/A 01/14/2015   Procedure: ENDOSCOPIC RETROGRADE CHOLANGIOPANCREATOGRAPHY (ERCP);  Surgeon: Lucilla Lame, MD;  Location: [facility] ENDOSCOPY;  Service: Endoscopy;  Laterality: N/A;   TONSILLECTOMY AND ADENOIDECTOMY       MEDICATIONS:  Prior to Admission medications   Medication Sig Start Date End Date Taking? Authorizing Provider  acetaminophen (TYLENOL) 325 MG tablet Take 650 mg by mouth every 6 (six) hours as needed.   Yes [provider]  albuterol (VENTOLIN HFA) 108 (90 Base) MCG/ACT inhaler Inhale 2 puffs into the lungs every 6 (six) hours as needed for wheezing or shortness of breath. 12/06/19  Yes Viviana Simpler I, MD  amLODipine (NORVASC) 10 MG tablet Take 1 tablet (10 mg total) by mouth daily. 11/05/20  Yes Viviana Simpler I, MD  aspirin EC 81 MG tablet Take 81 mg by mouth daily.   Yes [provider]  EPINEPHrine 0.3 mg/0.3 mL IJ SOAJ injection Inject 0.3 mg into the muscle as needed (for allergic reaction).   Yes [provider]  hydrochlorothiazide (HYDRODIURIL) 25 MG tablet Take 1 tablet by mouth once daily 07/07/20  Yes Elby Beck, FNP  ibuprofen (ADVIL) 200 MG tablet Take 200 mg by mouth every 6 (six) hours as needed.   Yes [provider]  loperamide (IMODIUM) 2 MG capsule Take 2 mg  by mouth 3 (three) times daily as needed for diarrhea or loose stools.   Yes [provider]  meclizine (ANTIVERT) 25 MG tablet TAKE 1 TABLET BY MOUTH THREE TIMES DAILY AS NEEDED FOR NAUSEA OR DIZZINESS Patient taking differently: Take 25 mg by mouth 3 (three) times daily as needed. 02/17/21  Yes [name], MD  metoprolol  (TOPROL-XL) 200 MG 24 hr tablet TAKE ONE TABLET BY MOUTH DAILY Patient taking differently: Take 200 mg by mouth daily. 06/25/20  Yes Viviana Simpler I, MD  ramipril (ALTACE) 10 MG capsule TAKE ONE CAPSULE BY MOUTH DAILY Patient taking differently: Take 10 mg by mouth daily. 10/06/20  Yes [name], MD  mometasone (ELOCON) 0.1 % cream Apply 1 application topically in the morning and at bedtime. Bid aa itchy rash on face, neck, chest and arms until clear Patient not taking: Reported on 02/18/2021 12/31/19   Brendolyn Patty, MD  predniSONE (DELTASONE) 20 MG tablet Take 2 tablets (40 mg total) by mouth daily. For 3 days, then 1 tab daily for 3 days Patient not taking: Reported on 02/18/2021 03/21/20   [name], MD  PREVALITE 4 g packet DISSOLVE ONE PACKET & TAKE BY MOUTH TWICE DAILY Patient not taking: Reported on 02/18/2021 06/11/19   [name], MD     ALLERGIES:  Allergies  Allergen Reactions   Codeine Sulfate     REACTION: rash/ welps   Other     Wild rice   Crestor [Rosuvastatin Calcium]     myalgia   Penicillins Hives and Rash    Has patient had a PCN reaction causing immediate rash, facial/tongue/throat swelling, SOB or lightheadedness with hypotension: Yes Has patient had a PCN reaction causing severe rash involving mucus membranes or skin necrosis: No Has patient had a PCN reaction that required hospitalization: No Has patient had a PCN reaction occurring within the last 10 years: Yes If all of the above answers are "NO", then may proceed with Cephalosporin use.     SOCIAL HISTORY:  Social History   Socioeconomic History   Marital status: Married    Spouse name: Not on file   Number of children: 3   Years of education: Not on file   Highest education level: Not on file  Occupational History   Occupation: retired 1st grade teacher  Tobacco Use   Smoking status: Former    Pack years: 0.00    Types: Cigarettes    Quit date: 08/16/1978    Years since quitting:  42.5   Smokeless tobacco: Never  Substance and Sexual Activity   Alcohol use: Never   Drug use: No   Sexual activity: Not on file  Other Topics Concern   Not on file  Social History Narrative   Has living will.    Son Mikki Santee (MD) to make health care decisions.--then son Laurey Arrow   Has DNR order in past and requests again--done   No tube feeds if cognitively unaware         Social Determinants of Health   Financial Resource Strain: Not on file  Food Insecurity: Not on file  Transportation Needs: Not on file  Physical Activity: Not on file  Stress: Not on file  Social Connections: Not on file  Intimate Partner Violence: Not on file     FAMILY HISTORY:  Family History  Problem Relation Age of Onset   COPD Mother    Coronary artery disease Neg Hx    Diabetes Neg Hx  Cancer Neg Hx        breast or colon cancer      REVIEW OF SYSTEMS:  Review of Systems  Constitutional:  Negative for chills and fever.  HENT:  Negative for congestion and sore throat.   Respiratory:  Negative for cough and shortness of breath.   Cardiovascular:  Negative for chest pain and palpitations.  Gastrointestinal:  Positive for abdominal pain and nausea. Negative for blood in stool, constipation, diarrhea and vomiting.  Genitourinary:  Negative for dysuria and urgency.  All other systems reviewed and are negative.  VITAL SIGNS:  Temp:  [98.2 F (36.8 C)] 98.2 F (36.8 C) (07/05 1826) Pulse Rate:  [64-90] 67 (07/06 0600) Resp:  [15-20] 15 (07/06 0600) BP: (105-153)/(50-86) 137/53 (07/06 0600) SpO2:  [92 %-100 %] 95 % (07/06 0600) Weight:  [68 kg] 68 kg (07/05 1827)     Height: 5\' 7"  (170.2 cm) Weight: 68 kg BMI (Calculated): 23.49   INTAKE/OUTPUT:  No intake/output data recorded.  PHYSICAL EXAM:  Physical Exam Vitals and nursing note reviewed. Exam conducted with a chaperone present.  Constitutional:      General: She is not in acute distress.    Appearance: She is well-developed and normal  weight. She is not ill-appearing.  HENT:     Head: Normocephalic and atraumatic.  Eyes:     General: No scleral icterus.    Extraocular Movements: Extraocular movements intact.     Pupils: Pupils are equal, round, and reactive to light.  Cardiovascular:     Rate and Rhythm: Normal rate and regular rhythm.     Heart sounds: No murmur heard. Pulmonary:     Effort: Pulmonary effort is normal. No respiratory distress.  Abdominal:     General: A surgical scar is present. There is no distension.     Palpations: Abdomen is soft.     Tenderness: There is abdominal tenderness in the right upper quadrant, right lower quadrant and suprapubic area. There is no guarding or rebound.     Comments: Abdomen is soft, non-tender, she is markedly tender throughout right abdomen and in suprapubic region, she does appear peritonitic   Genitourinary:    Comments: Deferred Skin:    General: Skin is warm and dry.     Coloration: Skin is not jaundiced or pale.  Neurological:     General: No focal deficit present.     Mental Status: She is alert and oriented to person, place, and time.  Psychiatric:        Mood and Affect: Mood normal.        Behavior: Behavior normal.     Labs:  CBC Latest Ref Rng & Units 02/17/2021 02/27/2020 07/11/2019  WBC 4.0 - 10.5 K/uL 7.5 6.7 7.7  Hemoglobin 12.0 - 15.0 g/dL 12.4 12.5 11.8(L)  Hematocrit 36.0 - 46.0 % 35.7(L) 36.5 34.2(L)  Platelets 150 - 400 K/uL 210 225.0 161   CMP Latest Ref Rng & Units 02/17/2021 02/27/2020 08/02/2019  Glucose 70 - 99 mg/dL 150(H) 114(H) 113(H)  BUN 8 - 23 mg/dL 47(H) 59(H) 32(H)  Creatinine 0.44 - 1.00 mg/dL 1.95(H) 1.64(H) 1.46(H)  Sodium 135 - 145 mmol/L 138 140 137  Potassium 3.5 - 5.1 mmol/L 4.8 5.0 5.0  Chloride 98 - 111 mmol/L 107 106 104  CO2 22 - 32 mmol/L 23 27 26   Calcium 8.9 - 10.3 mg/dL 9.3 9.6 9.1  Total Protein 6.5 - 8.1 g/dL 7.4 6.9 -  Total Bilirubin 0.3 - 1.2 mg/dL  1.0 0.9 -  Alkaline Phos 38 - 126 U/L 73 71 -  AST 15 -  41 U/L 19 17 -  ALT 0 - 44 U/L 15 14 -     Imaging studies:   CT Abdomen/Pelvis (02/18/2021) personally reviewed which shows pneumatosis and thickening of the right colon concerning for ischemia, no free air, extensive atherosclerosis, and radiologist report reviewed below:  IMPRESSION: Right colonic wall thickening with colonic pneumatosis and pericolonic edema. Extensive portal venous gas demonstrated in the liver. Changes are likely to represent colonic ischemia. Although there is significant aortic atherosclerosis, only minimal calcification is demonstrated in the superior mesenteric artery. Consider possible mesenteric venous thrombus or arterial embolus, entities which would not be visualized on noncontrast imaging.   Assessment/Plan: (ICD-10's: K64.9) 87 y.o. female with right sided abdominal pain found to have pneumatosis and associated edema of the right colon in the setting of significant atherosclerosis and peritonitis on examination, this is highly concerning for ischemic bowel    - From a general surgery perspective, I spent an extensive amount of time again reviewing the patient's presentation, work up, and examination findings with her and her son at bedside. They seemed to be well aware of the gravity of the situation and our high concern for bowel compromise. Again, I reviewed the typical recommendation in these scenario would be surgical intervention to include laparotomy, right hemicolectomy, ileostomy and mucus fistula creation. I reviewed risks of this procedure and potential complications, including but not limited to, prolonged ventilation, need for ICU stay, debilitation, and even death. I also discussed that if she chooses to forgo intervention this may likely be a fatal insult. She seemed very understanding of of discussion, and she continues to elect to forgo any surgical intervention. I gave her, and her son, multiple opportunities to ask questions, and forgoing  surgical intervention remained their decision. She stated that should she clinically deteriorate, she would like comfort measures only. I did briefly discuss the potential for home with hospice, but this would be completely dependent on her clinical condition. Again, at the end of the discussion, all her and her son's questions were addressed and answered.    - Appreciate vascular surgery input and assistance; agree that contrasted study if amenable would be helpful. However, I wonder if this adds much as she is not electing for major interventions.    - We will be available as needed. I will be happy to come back and discuss with other family members as they arrive or should they have questions  All of the above findings and recommendations were discussed with the patient and her family (son at bedside), and all of their questions were answered to heir expressed satisfaction.  Thank you for the opportunity to participate in this patient's care.   Face-to-face time spent with the patient and care providers was 60 minutes, with more than 50% of the time spent counseling, educating, and coordinating care of the patient.    -- Edison Simon, PA-C [facility] 02/18/2021, 7:14 AM [phone] M-F: 7am - 4pm

## 2021-02-18 NOTE — Progress Notes (Signed)
Patient ID: Jamie Hodges, female   DOB: 02-06-1928, 87 y.o.   MRN: [id number] [facility] PROGRESS NOTE  Jamie Hodges MHD:[id number] DOB: [birth year] DOA: 02/17/2021 PCP: [name], MD  HPI/Subjective: The patient feels a little bit better today than yesterday.  Was placed on heparin drip for an ischemic abdomen.  Patient absolutely does not want to go to the operating room even if it would mean her demise.  Some nausea and diarrhea.  Objective: Vitals:   02/18/21 1100 02/18/21 1200  BP: (!) 157/59 (!) 146/55  Pulse: 85 79  Resp: (!) 26 20  Temp: 98.2 F (36.8 C)   SpO2: 94% 96%   No intake or output data in the 24 hours ending 02/18/21 1310 Filed Weights   02/17/21 1827  Weight: 68 kg    ROS: Review of Systems  Respiratory:  Negative for cough and shortness of breath.   Cardiovascular:  Negative for chest pain.  Gastrointestinal:  Positive for abdominal pain, diarrhea and nausea. Negative for vomiting.  Exam: Physical Exam HENT:     Head: Normocephalic.     Mouth/Throat:     Pharynx: No oropharyngeal exudate.  Eyes:     General: Lids are normal.     Conjunctiva/sclera: Conjunctivae normal.     Pupils: Pupils are equal, round, and reactive to light.  Cardiovascular:     Rate and Rhythm: Normal rate and regular rhythm.     Heart sounds: Normal heart sounds, S1 normal and S2 normal.  Pulmonary:     Breath sounds: Normal breath sounds. No decreased breath sounds, wheezing, rhonchi or rales.  Abdominal:     Palpations: Abdomen is soft.     Tenderness: There is abdominal tenderness in the right upper quadrant and right lower quadrant.  Musculoskeletal:     Right lower leg: No swelling.     Left lower leg: No swelling.  Skin:    General: Skin is warm.     Findings: No rash.  Neurological:     Mental Status: She is alert and oriented to person, place, and time.     Data Reviewed: Basic Metabolic Panel: Recent Labs  Lab 02/17/21 1823 02/18/21 0719  NA  138 138  K 4.8 4.8  CL 107 108  CO2 23 21*  GLUCOSE 150* 152*  BUN 47* 54*  CREATININE 1.95* 2.14*  CALCIUM 9.3 8.5*   Liver Function Tests: Recent Labs  Lab 02/17/21 1823 02/18/21 0719  AST 19 30  ALT 15 19  ALKPHOS 73 64  BILITOT 1.0 1.0  PROT 7.4 6.1*  ALBUMIN 4.2 3.4*   Recent Labs  Lab 02/17/21 1823  LIPASE 32    CBC: Recent Labs  Lab 02/17/21 1823 02/18/21 0719  WBC 7.5 20.1*  HGB 12.4 11.5*  HCT 35.7* 34.0*  MCV 93.9 96.3  PLT 210 191     Recent Results (from the past 240 hour(s))  Resp Panel by RT-PCR (Flu A&B, Covid) Nasopharyngeal Swab     Status: None   Collection Time: 02/18/21 12:55 AM   Specimen: Nasopharyngeal Swab; Nasopharyngeal(NP) swabs in vial transport medium  Result Value Ref Range Status   SARS Coronavirus 2 by RT PCR NEGATIVE NEGATIVE Final    Comment: (NOTE) SARS-CoV-2 target nucleic acids are NOT DETECTED.  The SARS-CoV-2 RNA is generally detectable in upper respiratory specimens during the acute phase of infection. The lowest concentration of SARS-CoV-2 viral copies this assay can detect is 138 copies/mL. A negative result does not preclude SARS-Cov-2  infection and should not be used as the sole basis for treatment or other patient management decisions. A negative result may occur with  improper specimen collection/handling, submission of specimen other than nasopharyngeal swab, presence of viral mutation(s) within the areas targeted by this assay, and inadequate number of viral copies(<138 copies/mL). A negative result must be combined with clinical observations, patient history, and epidemiological information. The expected result is Negative.  Fact Sheet for Patients:  EntrepreneurPulse.com.au  Fact Sheet for Healthcare Providers:  IncredibleEmployment.be  This test is no t yet approved or cleared by the [place] FDA and  has been authorized for detection and/or diagnosis of  SARS-CoV-2 by FDA under an Emergency Use Authorization (EUA). This EUA will remain  in effect (meaning this test can be used) for the duration of the COVID-19 declaration under Section 564(b)(1) of the Act, 21 U.S.C.section 360bbb-3(b)(1), unless the authorization is terminated  or revoked sooner.       Influenza A by PCR NEGATIVE NEGATIVE Final   Influenza B by PCR NEGATIVE NEGATIVE Final    Comment: (NOTE) The Xpert Xpress SARS-CoV-2/FLU/RSV plus assay is intended as an aid in the diagnosis of influenza from Nasopharyngeal swab specimens and should not be used as a sole basis for treatment. Nasal washings and aspirates are unacceptable for Xpert Xpress SARS-CoV-2/FLU/RSV testing.  Fact Sheet for Patients: EntrepreneurPulse.com.au  Fact Sheet for Healthcare Providers: IncredibleEmployment.be  This test is not yet approved or cleared by the [place] FDA and has been authorized for detection and/or diagnosis of SARS-CoV-2 by FDA under an Emergency Use Authorization (EUA). This EUA will remain in effect (meaning this test can be used) for the duration of the COVID-19 declaration under Section 564(b)(1) of the Act, 21 U.S.C. section 360bbb-3(b)(1), unless the authorization is terminated or revoked.  Performed at [facility], [address] [place], [place] [address]      Studies: CT ABDOMEN PELVIS WO CONTRAST  Result Date: 02/18/2021 CLINICAL DATA:  Right lower quadrant abdominal pain. Upper abdominal pain. Nausea. EXAM: CT ABDOMEN AND PELVIS WITHOUT CONTRAST TECHNIQUE: Multidetector CT imaging of the abdomen and pelvis was performed following the standard protocol without IV contrast. COMPARISON:  CT chest 07/11/2019.  CT abdomen and pelvis 10/13/2014 FINDINGS: Lower chest: Lung bases are clear. Hepatobiliary: Diffuse portal venous gas throughout the liver. Surgical absence of the gallbladder. No bile duct dilatation. Pancreas:  Unremarkable. No pancreatic ductal dilatation or surrounding inflammatory changes. Spleen: Normal in size without focal abnormality. Adrenals/Urinary Tract: Adrenal glands are unremarkable. Kidneys are normal, without renal calculi, focal lesion, or hydronephrosis. Bladder is unremarkable. Stomach/Bowel: Stomach, small bowel, and colon are not abnormally distended. The ascending colon and proximal transverse colon demonstrate diffuse wall thickening with pericolonic edema and mural pneumatosis. Tiny gas in mesenteric portal veins. Changes are likely to represent colonic ischemia. Colonic diverticulosis without evidence of diverticulitis. Appendix is not identified. Opaque medication in the colon. Vascular/Lymphatic: Aortic atherosclerosis. No enlarged abdominal or pelvic lymph nodes. Reproductive: Uterus and bilateral adnexa are unremarkable. Other: No free air or free fluid in the abdomen. Abdominal wall musculature appears intact. Musculoskeletal: Degenerative changes in the hips and spine. No destructive bone lesions. IMPRESSION: Right colonic wall thickening with colonic pneumatosis and pericolonic edema. Extensive portal venous gas demonstrated in the liver. Changes are likely to represent colonic ischemia. Although there is significant aortic atherosclerosis, only minimal calcification is demonstrated in the superior mesenteric artery. Consider possible mesenteric venous thrombus or arterial embolus, entities which would not be  visualized on noncontrast imaging. Critical Value/emergent results were called by telephone at the time of interpretation on 02/18/2021 at 12:33 am to provider [facility] , who verbally acknowledged these results. Electronically Signed   By: Lucienne Capers M.D.   On: 02/18/2021 00:38    Scheduled Meds:  [START ON 02/19/2021] amLODipine  10 mg Oral Daily   [START ON 02/19/2021] metoprolol  200 mg Oral Daily   Continuous Infusions:  sodium chloride 125 mL (02/18/21 0400)   [START ON  02/19/2021] cefTRIAXone (ROCEPHIN)  IV     heparin 800 Units/hr (02/18/21 0159)   metronidazole 500 mg (02/18/21 1142)   sodium chloride Stopped (02/18/21 0404)    Assessment/Plan:  Acute colonic ischemia.  Case discussed with general surgeon and patient does not want any operation.  Case discussed with patient and son at the bedside and she does not want any operation even if it would mean her demise.  Empirically placed on heparin drip and antibiotics.  White blood cell count increasing.  May be able to start clear liquid diet later on this evening since no surgical intervention planned.  Vascular surgery also consulted.  Will order stool for C. difficile test but less likely with findings on CT scan. Acute kidney injury on chronic kidney disease stage IIIb.  IV fluid hydration.  Creatinine worsened to 2.14.  Baseline creatinine last year 1.64. Lactic acidosis.  IV fluids Essential hypertension.  Toprol and Norvasc prescribed Paroxysmal atrial fibrillation.  Currently in normal sinus rhythm on heparin drip for anticoagulation.  Toprol for rate control.     Code Status:     Code Status Orders  (From admission, onward)           Start     Ordered   02/18/21 0401  Do not attempt resuscitation (DNR)  Continuous       Question Answer Comment  In the event of cardiac or respiratory ARREST Do not call a "code blue"   In the event of cardiac or respiratory ARREST Do not perform Intubation, CPR, defibrillation or ACLS   In the event of cardiac or respiratory ARREST Use medication by any route, position, wound care, and other measures to relive pain and suffering. May use oxygen, suction and manual treatment of airway obstruction as needed for comfort.      02/18/21 0403           Code Status History     Date Active Date Inactive Code Status Order ID Comments User Context   02/18/2021 0141 02/18/2021 0403 DNR [id number]  Ward, Delice Bison, DO ED   06/23/2018 1726 06/24/2018 1512 DNR  [id number]  Hillary Bow, MD ED      Advance Directive Documentation    Flowsheet Row Most Recent Value  Type of Advance Directive Healthcare Power of Attorney, Living will  Pre-existing out of facility DNR order (yellow form or pink MOST form) --  "MOST" Form in Place? --      Family Communication: Spoke with son at the bedside Disposition Plan: Status is: Inpatient  Dispo: The patient is from: [facility] independent living              Anticipated d/c is to: [facility]              Patient currently does not want to undergo an operation at this point.  We will have to see how things go during the hospital course before making a decision on disposition.   Difficult to  place patient.  No.  Consultants: General surgery Vascular surgery  Time spent: 32 minutes, case discussed with general surgeon  Loletha Grayer  [facility]

## 2021-02-18 NOTE — ED Provider Notes (Signed)
[facility] Emergency Department Provider Note  ____________________________________________   Event Date/Time   First MD Initiated Contact with Patient 02/17/21 2350     (approximate)  I have reviewed the triage vital signs and the nursing notes.   HISTORY  Chief Complaint Abdominal Pain    HPI Jamie Hodges is a 87 y.o. female with history of paroxysmal atrial fibrillation, CAD, hypertension, hyperlipidemia, diverticulosis who presents to the emergency department with diffuse abdominal pain that started earlier today.  Unable to describe the pain further.  No aggravating or alleviating factors.  Had nausea and vomiting today.  Has chronic diarrhea that is unchanged.  No dysuria, hematuria, vaginal bleeding or discharge.  Status post cholecystectomy.        Past Medical History:  Diagnosis Date   Atrial fibrillation ([place]) 2010   One time during hospital while sick with severe diarrhea   C. difficile colitis    3/10  Severe C. dif ---had brief atrial fib then. Cath shows some blockages but no intervention   CAD (coronary artery disease) 2010   minor blockages --no intervention indicated   Hx of colonic polyps    Hyperlipidemia    Hypertension     Patient Active Problem List   Diagnosis Date Noted   Right hip pain 03/17/2020   Wheezing 12/06/2019   Aortic atherosclerosis ([place]) 07/20/2019   Chronic renal disease, stage III ([place]) 02/23/2019   Arachnoid cyst 06/29/2018   Cerebrovascular disease 06/29/2018   Vertigo 06/23/2018   Preventative health care 02/17/2018   IBS (irritable bowel syndrome) 05/26/2016   Counseling regarding advanced directives 02/27/2014   Benign paroxysmal positional vertigo 05/11/2013   Chronic diarrhea 06/09/2011   Essential hypertension, benign 09/16/2009   DIVERTICULOSIS, COLON 09/16/2009    Past Surgical History:  Procedure Laterality Date   CHOLECYSTECTOMY  3/16   ERCP N/A 01/14/2015   Procedure: ENDOSCOPIC  RETROGRADE CHOLANGIOPANCREATOGRAPHY (ERCP);  Surgeon: Lucilla Lame, MD;  Location: [facility] ENDOSCOPY;  Service: Endoscopy;  Laterality: N/A;   TONSILLECTOMY AND ADENOIDECTOMY      Prior to Admission medications   Medication Sig Start Date End Date Taking? Authorizing Provider  albuterol (VENTOLIN HFA) 108 (90 Base) MCG/ACT inhaler Inhale 2 puffs into the lungs every 6 (six) hours as needed for wheezing or shortness of breath. 12/06/19   [name], MD  amLODipine (NORVASC) 10 MG tablet Take 1 tablet (10 mg total) by mouth daily. 11/05/20   [name], MD  aspirin EC 81 MG tablet Take 81 mg by mouth daily.    [provider]  EPINEPHrine 0.3 mg/0.3 mL IJ SOAJ injection Inject 0.3 mg into the muscle as needed (for allergic reaction).    [provider]  hydrochlorothiazide (HYDRODIURIL) 25 MG tablet Take 1 tablet by mouth once daily 07/07/20   Elby Beck, FNP  loperamide (IMODIUM) 2 MG capsule Take 2 mg by mouth 3 (three) times daily as needed for diarrhea or loose stools.    [provider]  meclizine (ANTIVERT) 25 MG tablet TAKE 1 TABLET BY MOUTH THREE TIMES DAILY AS NEEDED FOR NAUSEA OR DIZZINESS 02/17/21   Viviana Simpler I, MD  metoprolol (TOPROL-XL) 200 MG 24 hr tablet TAKE ONE TABLET BY MOUTH DAILY 06/25/20   [name], MD  mometasone (ELOCON) 0.1 % cream Apply 1 application topically in the morning and at bedtime. Bid aa itchy rash on face, neck, chest and arms until clear 12/31/19   Brendolyn Patty, MD  predniSONE (  DELTASONE) 20 MG tablet Take 2 tablets (40 mg total) by mouth daily. For 3 days, then 1 tab daily for 3 days 03/21/20   [name], MD  PREVALITE 4 g packet DISSOLVE ONE PACKET & TAKE BY MOUTH TWICE DAILY 06/11/19   [name], MD  ramipril (ALTACE) 10 MG capsule TAKE ONE CAPSULE BY MOUTH DAILY 10/06/20   [name], MD    Allergies Codeine sulfate, Crestor [rosuvastatin calcium], and Penicillins  Family History   Problem Relation Age of Onset   COPD Mother    Coronary artery disease Neg Hx    Diabetes Neg Hx    Cancer Neg Hx        breast or colon cancer    Social History Social History   Tobacco Use   Smoking status: Former    Pack years: 0.00    Types: Cigarettes    Quit date: 08/16/1978    Years since quitting: 42.5   Smokeless tobacco: Never  Substance Use Topics   Alcohol use: Never   Drug use: No    Review of Systems Constitutional: No fever. Eyes: No visual changes. ENT: No sore throat. Cardiovascular: Denies chest pain. Respiratory: Denies shortness of breath. Gastrointestinal: + nausea, vomiting, diarrhea. Genitourinary: Negative for dysuria. Musculoskeletal: Negative for back pain. Skin: Negative for rash. Neurological: Negative for focal weakness or numbness.  ____________________________________________   PHYSICAL EXAM:  VITAL SIGNS: ED Triage Vitals  Enc Vitals Group     BP 02/17/21 1826 113/81     Pulse Rate 02/17/21 1826 90     Resp 02/17/21 1826 16     Temp 02/17/21 1826 98.2 F (36.8 C)     Temp Source 02/17/21 1826 Oral     SpO2 02/17/21 1826 100 %     Weight 02/17/21 1827 150 lb (68 kg)     Height 02/17/21 1827 5\' 7"  (1.702 m)     Head Circumference --      Peak Flow --      Pain Score 02/17/21 1827 6     Pain Loc --      Pain Edu? --      Excl. in [place]? --    CONSTITUTIONAL: Alert and oriented and responds appropriately to questions. Well-appearing; well-nourished, elderly, afebrile, in no distress HEAD: Normocephalic EYES: Conjunctivae clear, pupils appear equal, EOM appear intact ENT: normal nose; moist mucous membranes NECK: Supple, normal ROM CARD: RRR; S1 and S2 appreciated; no murmurs, no clicks, no rubs, no gallops RESP: Normal chest excursion without splinting or tachypnea; breath sounds clear and equal bilaterally; no wheezes, no rhonchi, no rales, no hypoxia or respiratory distress, speaking full sentences ABD/GI: Normal bowel  sounds; non-distended; tender to palpation diffusely throughout the abdomen which seems to be worse in the right lower quadrant with voluntary guarding, no rebound BACK: The back appears normal EXT: Normal ROM in all joints; no deformity noted, no edema; no cyanosis SKIN: Normal color for age and race; warm; no rash on exposed skin NEURO: Moves all extremities equally PSYCH: The patient's mood and manner are appropriate.  ____________________________________________   LABS (all labs ordered are listed, but only abnormal results are displayed)  Labs Reviewed  COMPREHENSIVE METABOLIC PANEL - Abnormal; Notable for the following components:      Result Value   Glucose, Bld 150 (*)    BUN 47 (*)    Creatinine, Ser 1.95 (*)    GFR, Estimated 24 (*)    All other components  within normal limits  CBC - Abnormal; Notable for the following components:   RBC 3.80 (*)    HCT 35.7 (*)    All other components within normal limits  LACTIC ACID, PLASMA - Abnormal; Notable for the following components:   Lactic Acid, Venous 2.0 (*)    All other components within normal limits  RESP PANEL BY RT-PCR (FLU A&B, COVID) ARPGX2  LIPASE, BLOOD  URINALYSIS, COMPLETE (UACMP) WITH MICROSCOPIC  PROTIME-INR  APTT  CBC  TYPE AND SCREEN  TROPONIN I (HIGH SENSITIVITY)   ____________________________________________  EKG   EKG Interpretation  Date/Time:  Tuesday February 17 2021 18:40:40 EDT Ventricular Rate:  67 PR Interval:  182 QRS Duration: 88 QT Interval:  416 QTC Calculation: 439 R Axis:   64 Text Interpretation: Normal sinus rhythm Possible Anterior infarct , age undetermined Abnormal ECG Confirmed by Pryor Curia [phone]) on 02/17/2021 11:44:56 PM         ____________________________________________  RADIOLOGY Jessie Foot Nieves Barberi, personally viewed and evaluated these images (plain radiographs) as part of my medical decision making, as well as reviewing the written report by the  radiologist.  ED MD interpretation: Extensive right-sided ischemic bowel.  Official radiology report(s): CT ABDOMEN PELVIS WO CONTRAST  Result Date: 02/18/2021 CLINICAL DATA:  Right lower quadrant abdominal pain. Upper abdominal pain. Nausea. EXAM: CT ABDOMEN AND PELVIS WITHOUT CONTRAST TECHNIQUE: Multidetector CT imaging of the abdomen and pelvis was performed following the standard protocol without IV contrast. COMPARISON:  CT chest 07/11/2019.  CT abdomen and pelvis 10/13/2014 FINDINGS: Lower chest: Lung bases are clear. Hepatobiliary: Diffuse portal venous gas throughout the liver. Surgical absence of the gallbladder. No bile duct dilatation. Pancreas: Unremarkable. No pancreatic ductal dilatation or surrounding inflammatory changes. Spleen: Normal in size without focal abnormality. Adrenals/Urinary Tract: Adrenal glands are unremarkable. Kidneys are normal, without renal calculi, focal lesion, or hydronephrosis. Bladder is unremarkable. Stomach/Bowel: Stomach, small bowel, and colon are not abnormally distended. The ascending colon and proximal transverse colon demonstrate diffuse wall thickening with pericolonic edema and mural pneumatosis. Tiny gas in mesenteric portal veins. Changes are likely to represent colonic ischemia. Colonic diverticulosis without evidence of diverticulitis. Appendix is not identified. Opaque medication in the colon. Vascular/Lymphatic: Aortic atherosclerosis. No enlarged abdominal or pelvic lymph nodes. Reproductive: Uterus and bilateral adnexa are unremarkable. Other: No free air or free fluid in the abdomen. Abdominal wall musculature appears intact. Musculoskeletal: Degenerative changes in the hips and spine. No destructive bone lesions. IMPRESSION: Right colonic wall thickening with colonic pneumatosis and pericolonic edema. Extensive portal venous gas demonstrated in the liver. Changes are likely to represent colonic ischemia. Although there is significant aortic  atherosclerosis, only minimal calcification is demonstrated in the superior mesenteric artery. Consider possible mesenteric venous thrombus or arterial embolus, entities which would not be visualized on noncontrast imaging. Critical Value/emergent results were called by telephone at the time of interpretation on 02/18/2021 at 12:33 am to provider [facility] , who verbally acknowledged these results. Electronically Signed   By: Lucienne Capers M.D.   On: 02/18/2021 00:38    ____________________________________________   PROCEDURES  Procedure(s) performed (including Critical Care):  Procedures  CRITICAL CARE Performed by: Cyril Mourning Bona Hubbard   Total critical care time: 65 minutes  Critical care time was exclusive of separately billable procedures and treating other patients.  Critical care was necessary to treat or prevent imminent or life-threatening deterioration.  Critical care was time spent personally by me on the following activities: development of treatment plan with patient  and/or surrogate as well as nursing, discussions with consultants, evaluation of patient's response to treatment, examination of patient, obtaining history from patient or surrogate, ordering and performing treatments and interventions, ordering and review of laboratory studies, ordering and review of radiographic studies, pulse oximetry and re-evaluation of patient's condition.  ____________________________________________   INITIAL IMPRESSION / ASSESSMENT AND PLAN / ED COURSE  As part of my medical decision making, I reviewed the following data within the [place] History obtained from family, Nursing notes reviewed and incorporated, Labs reviewed , EKG interpreted , Old chart reviewed, Radiograph reviewed , Discussed with admitting physician , A consult was requested and obtained from this/these consultant(s) General surgery and vascular surgery, and Notes from prior ED visits          Patient here with diffuse abdominal pain, worse in the right lower quadrant.  Differential includes appendicitis, UTI, pyelonephritis, kidney stone, colitis, diverticulitis, viral gastroenteritis.  Will give pain and nausea medicine.  She has slightly elevated creatinine from her baseline.  Will hydrate.  Will obtain CT of the abdomen pelvis without contrast.  ED PROGRESS  Patient CT scan concerning for colonic ischemia.  Will obtain lactic, COVID swab, coags, type and screen.  Had lengthy discussion with patient and son at bedside as well as her son who is an OB/GYN by phone.  We discussed risk and benefits and different treatment plans.  At this time patient is not sure if she would want surgical resection of her ischemic bowel as she does not want a colostomy.  Imaging is limited due to this being a noncontrast study given her creatinine.  We will continue to hydrate.  She is aware that without surgical intervention this is a life-threatening diagnosis.  Family would like for me to talk to vascular and general surgery about treatment plans, prognosis prior to deciding on treatment planned.  12:55 AM  Discussed with Dr. Delana Meyer with vascular surgery.  He agrees with starting IV heparin and aggressive hydration to see if we can repeat a contrasted study in 4 to 6 hours.  Agrees with general surgery consultation.  1:05 AM  Discussed with Dr. Hampton Abbot with general surgery.  He states that this would likely be a laparotomy, right-sided hemicolectomy with ileostomy and mucosal fistula.  Likely unable to be performed laparoscopically.  Agrees with Cipro, Flagyl in ED.  Patient is obviously very high risk given multiple comorbidities and her age.   1:45 AM  Discussed treatment plans, prognosis with patient and family at length.  At this time they have decided to proceed with IV antibiotics and IV heparin and reassess in the morning but do not want any surgical intervention.  Patient is aware that this is a  life-threatening diagnosis and will likely be fatal for her.  She confirms that she is a DNR/DNI and at this time is interested in the idea of passing naturally.  She would like to go back to [facility] where she currently resides.  She is currently in independent living.  We discussed that she would likely need to go to palliative care, SNF and that this could be something that social work could try to help arrange in the morning.  Will discuss with hospitalist for admission for IV hydration, antibiotics, heparin, pain control.  I have updated vascular and general surgery.   1:52 AM  Discussed patient's case with hospitalist, Dr. Sidney Ace.  I have recommended admission and patient (and family if present) agree with this plan. Admitting  physician will place admission orders.   I reviewed all nursing notes, vitals, pertinent previous records and reviewed/interpreted all EKGs, lab and urine results, imaging (as available).  ____________________________________________   FINAL CLINICAL IMPRESSION(S) / ED DIAGNOSES  Final diagnoses:  Ischemic bowel disease [facility]     ED Discharge Orders     None       *Please note:  Antigone Crowell was evaluated in Emergency Department on 02/18/2021 for the symptoms described in the history of present illness. She was evaluated in the context of the global COVID-19 pandemic, which necessitated consideration that the patient might be at risk for infection with the SARS-CoV-2 virus that causes COVID-19. Institutional protocols and algorithms that pertain to the evaluation of patients at risk for COVID-19 are in a state of rapid change based on information released by regulatory bodies including the CDC and federal and state organizations. These policies and algorithms were followed during the patient's care in the ED.  Some ED evaluations and interventions may be delayed as a result of limited staffing during and the pandemic.*   Note:  This document was prepared using  Dragon voice recognition software and may include unintentional dictation errors.    Madalen Gavin, Delice Bison, DO 02/18/21 0211

## 2021-02-19 ENCOUNTER — Telehealth: Payer: Self-pay | Admitting: *Deleted

## 2021-02-19 DIAGNOSIS — K55039 Acute (reversible) ischemia of large intestine, extent unspecified: Secondary | ICD-10-CM

## 2021-02-19 DIAGNOSIS — N179 Acute kidney failure, unspecified: Secondary | ICD-10-CM | POA: Diagnosis not present

## 2021-02-19 DIAGNOSIS — E872 Acidosis: Secondary | ICD-10-CM | POA: Diagnosis not present

## 2021-02-19 DIAGNOSIS — K559 Vascular disorder of intestine, unspecified: Secondary | ICD-10-CM | POA: Diagnosis not present

## 2021-02-19 DIAGNOSIS — I10 Essential (primary) hypertension: Secondary | ICD-10-CM | POA: Diagnosis not present

## 2021-02-19 LAB — CBC
HCT: 30.1 % — ABNORMAL LOW (ref 36.0–46.0)
Hemoglobin: 10.3 g/dL — ABNORMAL LOW (ref 12.0–15.0)
MCH: 32.3 pg (ref 26.0–34.0)
MCHC: 34.2 g/dL (ref 30.0–36.0)
MCV: 94.4 fL (ref 80.0–100.0)
Platelets: 154 10*3/uL (ref 150–400)
RBC: 3.19 MIL/uL — ABNORMAL LOW (ref 3.87–5.11)
RDW: 12.9 % (ref 11.5–15.5)
WBC: 16.1 10*3/uL — ABNORMAL HIGH (ref 4.0–10.5)
nRBC: 0 % (ref 0.0–0.2)

## 2021-02-19 LAB — BASIC METABOLIC PANEL
Anion gap: 7 (ref 5–15)
BUN: 42 mg/dL — ABNORMAL HIGH (ref 8–23)
CO2: 23 mmol/L (ref 22–32)
Calcium: 8.3 mg/dL — ABNORMAL LOW (ref 8.9–10.3)
Chloride: 109 mmol/L (ref 98–111)
Creatinine, Ser: 1.62 mg/dL — ABNORMAL HIGH (ref 0.44–1.00)
GFR, Estimated: 30 mL/min — ABNORMAL LOW (ref >60)
Glucose, Bld: 155 mg/dL — ABNORMAL HIGH (ref 70–99)
Potassium: 4.3 mmol/L (ref 3.5–5.1)
Sodium: 139 mmol/L (ref 135–145)

## 2021-02-19 LAB — HEPARIN LEVEL (UNFRACTIONATED)
Heparin Unfractionated: 0.18 IU/mL — ABNORMAL LOW (ref 0.30–0.70)
Heparin Unfractionated: 0.61 IU/mL (ref 0.30–0.70)

## 2021-02-19 MED ORDER — MORPHINE SULFATE (CONCENTRATE) 10 MG/0.5ML PO SOLN
5.0000 mg | ORAL | Status: DC | PRN
  Administered 2021-02-19 (×2): 5 mg via ORAL
  Filled 2021-02-19 (×2): qty 0.5

## 2021-02-19 MED ORDER — METOPROLOL SUCCINATE ER 100 MG PO TB24
200.0000 mg | ORAL_TABLET | Freq: Every day | ORAL | Status: DC
  Administered 2021-02-19 – 2021-02-23 (×5): 200 mg via ORAL
  Filled 2021-02-19 (×5): qty 2

## 2021-02-19 MED ORDER — HEPARIN BOLUS VIA INFUSION
2000.0000 [IU] | Freq: Once | INTRAVENOUS | Status: AC
  Administered 2021-02-19: 2000 [IU] via INTRAVENOUS
  Filled 2021-02-19: qty 2000

## 2021-02-19 MED ORDER — SODIUM CHLORIDE 0.9 % IV SOLN
6.2500 mg | Freq: Four times a day (QID) | INTRAVENOUS | Status: DC | PRN
  Filled 2021-02-19 (×2): qty 0.25

## 2021-02-19 MED ORDER — SODIUM CHLORIDE 0.9 % IV SOLN
6.2500 mg | Freq: Once | INTRAVENOUS | Status: AC
  Administered 2021-02-19: 6.25 mg via INTRAVENOUS
  Filled 2021-02-19: qty 0.25

## 2021-02-19 NOTE — Progress Notes (Signed)
Patient ID: Jamie Hodges, female   DOB: 08-07-1928, 87 y.o.   MRN: [id number] [facility] PROGRESS NOTE  Taneesha Edgin DPO:[id number] DOB: 03-21-1928 DOA: 02/17/2021 PCP: Viviana Simpler I, MD  HPI/Subjective: Patient's feeling some nausea/vomiting and some abdominal pain.  Receiving pain medications IV.  Patient on liquid diet.  Needed to change the nausea medications to Phenergan today.  Patient again very adamant that she does not want surgical intervention.  Family and patient interested in hospice.  Objective: Vitals:   02/19/21 0700 02/19/21 1000  BP: (!) 146/51 (!) 159/69  Pulse: 87 92  Resp: 18   Temp: 98.9 F (37.2 C)   SpO2: 96% 95%    Intake/Output Summary (Last 24 hours) at 02/19/2021 1515 Last data filed at 02/19/2021 0313 Gross per 24 hour  Intake 2808.87 ml  Output --  Net 2808.87 ml   Filed Weights   02/17/21 1827  Weight: 68 kg    ROS: Review of Systems  Respiratory:  Negative for shortness of breath.   Cardiovascular:  Negative for chest pain.  Gastrointestinal:  Positive for abdominal pain and nausea. Negative for vomiting.  Exam: Physical Exam HENT:     Head: Normocephalic.  Eyes:     General: Lids are normal.     Conjunctiva/sclera: Conjunctivae normal.  Cardiovascular:     Rate and Rhythm: Normal rate and regular rhythm.     Heart sounds: Normal heart sounds, S1 normal and S2 normal.  Pulmonary:     Breath sounds: Normal breath sounds. No decreased breath sounds, wheezing, rhonchi or rales.  Abdominal:     General: There is distension.     Palpations: Abdomen is soft.     Tenderness: There is abdominal tenderness in the right upper quadrant and right lower quadrant.  Musculoskeletal:     Right lower leg: No swelling.     Left lower leg: No swelling.  Skin:    General: Skin is warm.     Findings: No rash.  Neurological:     Mental Status: She is alert and oriented to person, place, and time.     Data Reviewed: Basic Metabolic  Panel: Recent Labs  Lab 02/17/21 1823 02/18/21 0719 02/19/21 0631  NA 138 138 139  K 4.8 4.8 4.3  CL 107 108 109  CO2 23 21* 23  GLUCOSE 150* 152* 155*  BUN 47* 54* 42*  CREATININE 1.95* 2.14* 1.62*  CALCIUM 9.3 8.5* 8.3*   Liver Function Tests: Recent Labs  Lab 02/17/21 1823 02/18/21 0719  AST 19 30  ALT 15 19  ALKPHOS 73 64  BILITOT 1.0 1.0  PROT 7.4 6.1*  ALBUMIN 4.2 3.4*   Recent Labs  Lab 02/17/21 1823  LIPASE 32   CBC: Recent Labs  Lab 02/17/21 1823 02/18/21 0719 02/19/21 0631  WBC 7.5 20.1* 16.1*  HGB 12.4 11.5* 10.3*  HCT 35.7* 34.0* 30.1*  MCV 93.9 96.3 94.4  PLT 210 191 154     Recent Results (from the past 240 hour(s))  Resp Panel by RT-PCR (Flu A&B, Covid) Nasopharyngeal Swab     Status: None   Collection Time: 02/18/21 12:55 AM   Specimen: Nasopharyngeal Swab; Nasopharyngeal(NP) swabs in vial transport medium  Result Value Ref Range Status   SARS Coronavirus 2 by RT PCR NEGATIVE NEGATIVE Final    Comment: (NOTE) SARS-CoV-2 target nucleic acids are NOT DETECTED.  The SARS-CoV-2 RNA is generally detectable in upper respiratory specimens during the acute phase of infection. The lowest concentration of  SARS-CoV-2 viral copies this assay can detect is 138 copies/mL. A negative result does not preclude SARS-Cov-2 infection and should not be used as the sole basis for treatment or other patient management decisions. A negative result may occur with  improper specimen collection/handling, submission of specimen other than nasopharyngeal swab, presence of viral mutation(s) within the areas targeted by this assay, and inadequate number of viral copies(<138 copies/mL). A negative result must be combined with clinical observations, patient history, and epidemiological information. The expected result is Negative.  Fact Sheet for Patients:  EntrepreneurPulse.com.au  Fact Sheet for Healthcare Providers:   IncredibleEmployment.be  This test is no t yet approved or cleared by the [place] FDA and  has been authorized for detection and/or diagnosis of SARS-CoV-2 by FDA under an Emergency Use Authorization (EUA). This EUA will remain  in effect (meaning this test can be used) for the duration of the COVID-19 declaration under Section 564(b)(1) of the Act, 21 U.S.C.section 360bbb-3(b)(1), unless the authorization is terminated  or revoked sooner.       Influenza A by PCR NEGATIVE NEGATIVE Final   Influenza B by PCR NEGATIVE NEGATIVE Final    Comment: (NOTE) The Xpert Xpress SARS-CoV-2/FLU/RSV plus assay is intended as an aid in the diagnosis of influenza from Nasopharyngeal swab specimens and should not be used as a sole basis for treatment. Nasal washings and aspirates are unacceptable for Xpert Xpress SARS-CoV-2/FLU/RSV testing.  Fact Sheet for Patients: EntrepreneurPulse.com.au  Fact Sheet for Healthcare Providers: IncredibleEmployment.be  This test is not yet approved or cleared by the [place] FDA and has been authorized for detection and/or diagnosis of SARS-CoV-2 by FDA under an Emergency Use Authorization (EUA). This EUA will remain in effect (meaning this test can be used) for the duration of the COVID-19 declaration under Section 564(b)(1) of the Act, 21 U.S.C. section 360bbb-3(b)(1), unless the authorization is terminated or revoked.  Performed at [facility], [address], [place], [place] [address]      Studies: CT ABDOMEN PELVIS WO CONTRAST  Result Date: 02/18/2021 CLINICAL DATA:  Right lower quadrant abdominal pain. Upper abdominal pain. Nausea. EXAM: CT ABDOMEN AND PELVIS WITHOUT CONTRAST TECHNIQUE: Multidetector CT imaging of the abdomen and pelvis was performed following the standard protocol without IV contrast. COMPARISON:  CT chest 07/11/2019.  CT abdomen and pelvis 10/13/2014  FINDINGS: Lower chest: Lung bases are clear. Hepatobiliary: Diffuse portal venous gas throughout the liver. Surgical absence of the gallbladder. No bile duct dilatation. Pancreas: Unremarkable. No pancreatic ductal dilatation or surrounding inflammatory changes. Spleen: Normal in size without focal abnormality. Adrenals/Urinary Tract: Adrenal glands are unremarkable. Kidneys are normal, without renal calculi, focal lesion, or hydronephrosis. Bladder is unremarkable. Stomach/Bowel: Stomach, small bowel, and colon are not abnormally distended. The ascending colon and proximal transverse colon demonstrate diffuse wall thickening with pericolonic edema and mural pneumatosis. Tiny gas in mesenteric portal veins. Changes are likely to represent colonic ischemia. Colonic diverticulosis without evidence of diverticulitis. Appendix is not identified. Opaque medication in the colon. Vascular/Lymphatic: Aortic atherosclerosis. No enlarged abdominal or pelvic lymph nodes. Reproductive: Uterus and bilateral adnexa are unremarkable. Other: No free air or free fluid in the abdomen. Abdominal wall musculature appears intact. Musculoskeletal: Degenerative changes in the hips and spine. No destructive bone lesions. IMPRESSION: Right colonic wall thickening with colonic pneumatosis and pericolonic edema. Extensive portal venous gas demonstrated in the liver. Changes are likely to represent colonic ischemia. Although there is significant aortic atherosclerosis, only minimal calcification is demonstrated in  the superior mesenteric artery. Consider possible mesenteric venous thrombus or arterial embolus, entities which would not be visualized on noncontrast imaging. Critical Value/emergent results were called by telephone at the time of interpretation on 02/18/2021 at 12:33 am to provider [facility] , who verbally acknowledged these results. Electronically Signed   By: Lucienne Capers M.D.   On: 02/18/2021 00:38    Scheduled Meds:   amLODipine  5 mg Oral Daily   metoprolol  200 mg Oral Daily   Continuous Infusions:  sodium chloride 75 mL/hr at 02/19/21 0735   cefTRIAXone (ROCEPHIN)  IV 2 g (02/19/21 1502)   heparin 1,000 Units/hr (02/19/21 0747)   metronidazole 100 mL/hr at 02/19/21 1218   promethazine (PHENERGAN) injection (IM or IVPB)     sodium chloride Stopped (02/18/21 0404)    Assessment/Plan:  Acute colonic ischemia.  Patient with abdominal pain and nausea.  Changed Zofran over to Phenergan.  As needed pain medications.  Asked her to try Roxanol orally also.  Patient empirically on heparin drip and antibiotics but likely will discontinue if goes with hospice.  Full liquid diet today.  White blood cell count down slightly from 20.1 down to 16.1. Acute kidney injury on chronic kidney disease stage IIIb.  On gentle IV fluid hydration.  Creatinine 2.14 at its peak and came down to 1.62. Lactic acidosis on gentle IV fluids Essential hypertension cut back on Norvasc.  Continue metoprolol Paroxysmal atrial fibrillation.  Currently in normal sinus rhythm.  Toprol for rate control.    Code Status:     Code Status Orders  (From admission, onward)           Start     Ordered   02/18/21 0401  Do not attempt resuscitation (DNR)  Continuous       Question Answer Comment  In the event of cardiac or respiratory ARREST Do not call a "code blue"   In the event of cardiac or respiratory ARREST Do not perform Intubation, CPR, defibrillation or ACLS   In the event of cardiac or respiratory ARREST Use medication by any route, position, wound care, and other measures to relive pain and suffering. May use oxygen, suction and manual treatment of airway obstruction as needed for comfort.      02/18/21 0403           Code Status History     Date Active Date Inactive Code Status Order ID Comments User Context   02/18/2021 0141 02/18/2021 0403 DNR [id number]  Ward, Delice Bison, DO ED   06/23/2018 1726 06/24/2018 1512 DNR  [id number]  Hillary Bow, MD ED      Advance Directive Documentation    Flowsheet Row Most Recent Value  Type of Advance Directive Healthcare Power of Attorney  Pre-existing out of facility DNR order (yellow form or pink MOST form) --  "MOST" Form in Place? --      Family Communication: Spoke with both sons at the bedside in the morning and spoke with Mikki Santee on the phone in the afternoon Disposition Plan: Status is: Inpatient  Dispo: The patient is from: Home              Anticipated d/c is to: Potentially home with hospice              Patient currently    Difficult to place patient. no  Consultants: General surgery  Antibiotics: Rocephin Flagyl  Time spent: 28 minutes  [place]

## 2021-02-19 NOTE — Progress Notes (Signed)
Brief Progress Note I briefly stopped by to see the patient and close loop with family at bedside. Patient reports she continues to feel "about the same as yesterday." She was not able to tolerate CLD well and now endorsing nausea. She did have mild improvement in leukocytosis to 16.1K (from 20.1K) and renal function improved some with sCr - 1.62 (from 2.14). She remains hemodynamically stable at the moment. On examination, she appears now more diffusely tender to light palpation and continues to have signs of peritonitis. She, and her family at bedside, remain adamant on comfort measures only and are interested in discussing hospice resources, which I think is reasonable. I gave them another opportunity to ask questions, which I answered to the best of my ability.   We will remain available as needed.   -- Edison Simon, PA-C [facility] 02/19/2021, 9:15 AM [phone] M-F: 7am - 4pm

## 2021-02-19 NOTE — Telephone Encounter (Signed)
I spoke to son ( and patient on speaker phone) Numbers some better but still nausea and doesn't feel good. Trying to decide whether to stop Rx altogether (if she does, I recommended the hospice home for what may be rapidly changing needs)--or continue the antibiotics/fluids, etc Still determined not to have surgery He will check in with [place] tomorrow

## 2021-02-19 NOTE — Telephone Encounter (Signed)
Dr. Georgana Curio left voicemail on triage stating he was calling in regards to his mom Jamie Hodges.  She is currently in patient at [facility] and is requesting a call back from Dr. Silvio Pate to discuss his mom's condition, care and further treatment plan.

## 2021-02-19 NOTE — Telephone Encounter (Signed)
I will forward this to Dr Silvio Pate in his secure email and send this to his inbox.

## 2021-02-19 NOTE — Clinical Social Work Note (Signed)
CSW acknowledges consult that patient is from [place]. Will keep facility representative updated on plan of care.  Dayton Scrape, [place]

## 2021-02-19 NOTE — Progress Notes (Signed)
   02/19/21 1200  Clinical Encounter Type  Visited With Patient and family together  Visit Type Initial  Referral From Patient  Consult/Referral To Chaplain  Spiritual Encounters  Spiritual Needs Prayer;Ritual  Stress Factors  Patient Stress Factors Health changes  Chaplain Ormond received a call from Pt's son requesting that his mother be seen by a priest. Romie Jumper called contacted [employer] and left message for Father Evette Doffing. Father Evette Doffing returned our call and stated he will come right over. Chaplain Ormond did a FU call with PT's son Mr. Cavey and relayed to him the estimated time of arrival. Father Evette Doffing arrived at [facility] at 1241 pm.

## 2021-02-19 NOTE — Progress Notes (Signed)
[place] for Heparin  Indication: ischemic right colon c/f potential underlying mesenteric thrombosis  Patient Measurements: Height: 5\' 7"  (170.2 cm) Weight: 68 kg (150 lb) IBW/kg (Calculated) : 61.6 Heparin Dosing Weight: 68 kg   Vital Signs: Temp: 98.9 F (37.2 C) (07/07 1545) Temp Source: Oral (07/07 0700) BP: 157/73 (07/07 1545) Pulse Rate: 88 (07/07 1545)  Labs: Recent Labs    02/17/21 1823 02/18/21 0101 02/18/21 0719 02/18/21 0953 02/18/21 1832 02/19/21 0631 02/19/21 1704  HGB 12.4  --  11.5*  --   --  10.3*  --   HCT 35.7*  --  34.0*  --   --  30.1*  --   PLT 210  --  191  --   --  154  --   APTT  --  24  --   --   --   --   --   LABPROT  --  13.2  --   --   --   --   --   INR  --  1.0  --   --   --   --   --   HEPARINUNFRC  --   --   --    < > 0.30 0.18* 0.61  CREATININE 1.95*  --  2.14*  --   --  1.62*  --   TROPONINIHS 6  --   --   --   --   --   --    < > = values in this interval not displayed.     Estimated Creatinine Clearance: 22 mL/min (A) (by C-G formula based on SCr of 1.62 mg/dL (H)).   Medical History: Past Medical History:  Diagnosis Date   Atrial fibrillation ([place]) 2010   One time during hospital while sick with severe diarrhea   C. difficile colitis    3/10  Severe C. dif ---had brief atrial fib then. Cath shows some blockages but no intervention   CAD (coronary artery disease) 2010   minor blockages --no intervention indicated   Hx of colonic polyps    Hyperlipidemia    Hypertension     Medications:  Heparin Dosing Weight: 68 kg   Assessment: Pharmacy consulted to dose heparin in this 87 year old female w/ h/o HTN, HLD, parAFib (on ASA w/o AC), CAD and C.diff colitis admitted with ischemic right colon and c/f potential underlying mesenteric thrombosis. No prior anticoag noted. H&H, platelets trending down  Date Time Anti-Xa Rate/Comment 07/06 0953 0.38  No change; 800  un/hr   07/06 1832 0.30  No change; 800 units/hr 07/07 0631 0.18  800 > 1000 units/hr 07/07 1704 0.61  No change; 1000 units/hr  Goal of Therapy:  anti-Xa level 0.3-0.7 units/ml Monitor platelets by anticoagulation protocol: Yes   Plan:  Heparin level therapeutic x 1  Continue heparin infusion rate at 1000 units/hr. Recheck anti-Xa level 8 hours to confirm Continue to monitor H&H and platelets daily   Dorothe Pea, PharmD, BCPS Clinical Pharmacist   02/19/2021,5:52 PM

## 2021-02-19 NOTE — Progress Notes (Signed)
ANTICOAGULATION CONSULT NOTE  Pharmacy Consult for Heparin  Indication: ischemic right colon c/f potential underlying mesenteric thrombosis  Patient Measurements: Height: 5\' 7"  (170.2 cm) Weight: 68 kg (150 lb) IBW/kg (Calculated) : 61.6 Heparin Dosing Weight: 68 kg   Vital Signs: Temp: 98.6 F (37 C) (07/07 0604) Temp Source: Oral (07/07 0604) BP: 160/67 (07/07 0604) Pulse Rate: 99 (07/07 0604)  Labs: Recent Labs    02/17/21 1823 02/18/21 0101 02/18/21 0719 02/18/21 0953 02/18/21 1832 02/19/21 0631  HGB 12.4  --  11.5*  --   --  10.3*  HCT 35.7*  --  34.0*  --   --  30.1*  PLT 210  --  191  --   --  154  APTT  --  24  --   --   --   --   LABPROT  --  13.2  --   --   --   --   INR  --  1.0  --   --   --   --   HEPARINUNFRC  --   --   --  0.38 0.30  --   CREATININE 1.95*  --  2.14*  --   --   --   TROPONINIHS 6  --   --   --   --   --      Estimated Creatinine Clearance: 16.7 mL/min (A) (by C-G formula based on SCr of 2.14 mg/dL (H)).   Medical History: Past Medical History:  Diagnosis Date   Atrial fibrillation ([place]) 2010   One time during hospital while sick with severe diarrhea   C. difficile colitis    3/10  Severe C. dif ---had brief atrial fib then. Cath shows some blockages but no intervention   CAD (coronary artery disease) 2010   minor blockages --no intervention indicated   Hx of colonic polyps    Hyperlipidemia    Hypertension     Medications:  Heparin Dosing Weight: 68 kg   Assessment: Pharmacy consulted to dose heparin in this 87 year old female w/ h/o HTN, HLD, parAFib (on ASA w/o AC), CAD and C.diff colitis admitted with ischemic right colon and c/f potential underlying mesenteric thrombosis. No prior anticoag noted. H&H, platelets trending down  Date Time Anti-Xa Rate/Comment 07/06 0953 0.38  No change; 800 un/hr   07/06 1832 0.30  No change; 800 units/hr 07/07 0631 0.18  1000 units/hr  Goal of Therapy:  anti-Xa level 0.3-0.7  units/ml Monitor platelets by anticoagulation protocol: Yes   Plan:  Heparin level subtherapeutic  d/w RN: no apparent interruptions to therapy Bolus 2000 units IV heparin increase heparin infusion rate to 1000 units/hr. Recheck anti-Xa level 8 hours after rate change Continue to monitor H&H and platelets daily   Dallie Piles, PharmD, BCPS Clinical Pharmacist   02/19/2021,7:07 AM

## 2021-02-19 NOTE — TOC Initial Note (Addendum)
Transition of Care [facility]) - Initial/Assessment Note    Patient Details  Name: Jamie Hodges MRN: [id number] Date of Birth: [birth year]  Transition of Care [facility]) CM/SW Contact:    Candie Chroman, LCSW Phone Number: 02/19/2021, 10:29 AM  Clinical Narrative:   CSW met with patient and her sons at bedside. Patient not feeling well enough to talk to had conversation with sons outside of room. Discussed options for hospice care. She could either return to ILF with hospice, go to the healthcare side at [facility] with hospice (room and board would be $320 out of pocket per day) or hospice house. Notified them that preferred hospice agency for [facility] is [facility] but provided full list of agencies that serve [facility]. Sons will consider options and follow up with TOC team. No further concerns. CSW encouraged patient's sons to contact CSW as needed. CSW will continue to follow patient and her sons for support and facilitate discharge once stable.            3:39 pm: Gave heads up hospice referral to Margaretmary Eddy, RN with [facility]. Told her discharge venue had not been confirmed yet.  Expected Discharge Plan: [facility] (vs SNF with hospice vs Hospice house) Barriers to Discharge: Continued Medical Work up   Patient Goals and CMS Choice Patient states their goals for this hospitalization and ongoing recovery are:: Patient doesn't feel well enough to talk.      Expected Discharge Plan and Services Expected Discharge Plan: [place] (vs SNF with hospice vs Hospice house)     Post Acute Care Choice:  (Hospice. Venue TBD.) Living arrangements for the past 2 months: [place]                                      Prior Living Arrangements/Services Living arrangements for the past 2 months: [place] Lives with:: Self Patient language and need for interpreter reviewed:: Yes Do you feel safe going back to the place where you  live?: Yes      Need for Family Participation in Patient Care: Yes (Comment) Care giver support system in place?: Yes (comment)   Criminal Activity/Legal Involvement Pertinent to Current Situation/Hospitalization: No - Comment as needed  Activities of Daily Living Home Assistive Devices/Equipment: None ADL Screening (condition at time of admission) Patient's cognitive ability adequate to safely complete daily activities?: Yes Is the patient deaf or have difficulty hearing?: Yes Does the patient have difficulty seeing, even when wearing glasses/contacts?: No Does the patient have difficulty concentrating, remembering, or making decisions?: No Patient able to express need for assistance with ADLs?: Yes Does the patient have difficulty dressing or bathing?: No Independently performs ADLs?: Yes (appropriate for developmental age) Does the patient have difficulty walking or climbing stairs?: No Weakness of Legs: None Weakness of Arms/Hands: None  Permission Sought/Granted Permission sought to share information with : Facility Sport and exercise psychologist, Family Supports    Share Information with NAME: Herbie [place] and [place] granted to share info w AGENCY: [place] granted to share info w Relationship: Sons  Permission granted to share info w Contact Information: Herbie [place]: [phone], Laurey [place]: [phone]  Emotional Assessment Appearance:: Appears stated age Attitude/Demeanor/Rapport: Unable to Assess Affect (typically observed): Unable to Assess Orientation: : Oriented to Self, Oriented to Place, Oriented to  Time, Oriented to Situation Alcohol / Substance Use: Not  Applicable Psych Involvement: No (comment)  Admission diagnosis:  Ischemic bowel disease ([place]) [K55.9] Colonic ischemia ([place]) [K55.9] Patient Active Problem List   Diagnosis Date Noted   Ischemic bowel disease ([place]) 02/18/2021   Colonic ischemia ([place]) 02/18/2021   Diarrhea    Acute kidney injury  superimposed on CKD (HCC)    Lactic acidosis    Right hip pain 03/17/2020   Wheezing 12/06/2019   Aortic atherosclerosis ([place]) 07/20/2019   Chronic renal disease, stage III ([place]) 02/23/2019   Arachnoid cyst 06/29/2018   Cerebrovascular disease 06/29/2018   Vertigo 06/23/2018   Preventative health care 02/17/2018   IBS (irritable bowel syndrome) 05/26/2016   Counseling regarding advanced directives 02/27/2014   Benign paroxysmal positional vertigo 05/11/2013   Chronic diarrhea 06/09/2011   Essential hypertension 09/16/2009   AF (paroxysmal atrial fibrillation) ([place]) 09/16/2009   DIVERTICULOSIS, COLON 09/16/2009   PCP:  [name], MD Pharmacy:   [facility] - [address], [place] [address] Phone: [phone] Fax: [phone]  [facility] [address] [place] [address] Phone: [phone] Fax: [place] [address] Phone: [phone] Fax: [phone]     Social Determinants of Health (SDOH) Interventions    Readmission Risk Interventions No flowsheet data found.

## 2021-02-19 NOTE — Progress Notes (Addendum)
02/19/2021  Subjective: Patient reports still having pain.  When asking her sons, they report that she seems to be having more diffuse pain this morning.  Her vital signs have been stable without any hypotension or tachycardia.  Her WBC and Cr are improved today.  She's having some nausea this morning and while I was in there room, stated she did not feel good.  Vital signs: Temp:  [98.2 F (36.8 C)-98.9 F (37.2 C)] 98.9 F (37.2 C) (07/07 0700) Pulse Rate:  [79-99] 92 (07/07 1000) Resp:  [16-26] 18 (07/07 0700) BP: (117-160)/(51-72) 159/69 (07/07 1000) SpO2:  [91 %-98 %] 95 % (07/07 1000)   Intake/Output: 07/06 0701 - 07/07 0700 In: 2808.9 [P.O.:120; I.V.:2416.7; IV Piggyback:272.2] Out: -  Last BM Date: 02/18/21  Physical Exam: Constitutional: No acute distress Abdomen:  soft, mildly distended, with more diffuse tenderness now extending from right upper/lower quadrants towards the umbilicus.  No significant tenderness in the left side yet.    Labs:  Recent Labs    02/18/21 0719 02/19/21 0631  WBC 20.1* 16.1*  HGB 11.5* 10.3*  HCT 34.0* 30.1*  PLT 191 154   Recent Labs    02/18/21 0719 02/19/21 0631  NA 138 139  K 4.8 4.3  CL 108 109  CO2 21* 23  GLUCOSE 152* 155*  BUN 54* 42*  CREATININE 2.14* 1.62*  CALCIUM 8.5* 8.3*   Recent Labs    02/18/21 0101  LABPROT 13.2  INR 1.0    Imaging: No results found.  Assessment/Plan: This is a 87 y.o. female with ischemic right colon  --Discussed with the sons that although her Cr and WBC are improved, clinically she has not and is deteriorating in that her pain is getting more diffuse.  I think her abx and fluids are masking the numbers, and as her ischemia starts turning to necrosis, will start seeing signs of sepsis.  Surprisingly her vital signs remain stable at this point.  The patient does not want surgery and we're respecting that.  She and her sons are looking into hospice options for [facility] vs at home.  I  have asked SW team to see her to help them navigate through this process.  They have also contacted more family members so they can see her before transitioning to comfort measures. --No surgical needs at this point.  We'll sign off but will be available if needed.  Face-to-face time spent with the patient and care providers was 25 minutes, with more than 50% of the time spent counseling, educating, and coordinating care of the patient.    Melvyn Neth, [facility]

## 2021-02-20 ENCOUNTER — Inpatient Hospital Stay: Payer: Medicare Other

## 2021-02-20 DIAGNOSIS — N179 Acute kidney failure, unspecified: Secondary | ICD-10-CM | POA: Diagnosis not present

## 2021-02-20 DIAGNOSIS — K559 Vascular disorder of intestine, unspecified: Secondary | ICD-10-CM | POA: Diagnosis not present

## 2021-02-20 DIAGNOSIS — R531 Weakness: Secondary | ICD-10-CM

## 2021-02-20 DIAGNOSIS — E872 Acidosis: Secondary | ICD-10-CM | POA: Diagnosis not present

## 2021-02-20 DIAGNOSIS — I4891 Unspecified atrial fibrillation: Secondary | ICD-10-CM | POA: Diagnosis not present

## 2021-02-20 DIAGNOSIS — I480 Paroxysmal atrial fibrillation: Secondary | ICD-10-CM | POA: Diagnosis not present

## 2021-02-20 LAB — BASIC METABOLIC PANEL
Anion gap: 7 (ref 5–15)
BUN: 28 mg/dL — ABNORMAL HIGH (ref 8–23)
CO2: 20 mmol/L — ABNORMAL LOW (ref 22–32)
Calcium: 8.3 mg/dL — ABNORMAL LOW (ref 8.9–10.3)
Chloride: 112 mmol/L — ABNORMAL HIGH (ref 98–111)
Creatinine, Ser: 1.4 mg/dL — ABNORMAL HIGH (ref 0.44–1.00)
GFR, Estimated: 36 mL/min — ABNORMAL LOW (ref >60)
Glucose, Bld: 121 mg/dL — ABNORMAL HIGH (ref 70–99)
Potassium: 3.8 mmol/L (ref 3.5–5.1)
Sodium: 139 mmol/L (ref 135–145)

## 2021-02-20 LAB — HEPARIN LEVEL (UNFRACTIONATED)
Heparin Unfractionated: 0.32 IU/mL (ref 0.30–0.70)
Heparin Unfractionated: 0.14 IU/mL — ABNORMAL LOW (ref 0.30–0.70)

## 2021-02-20 LAB — CBC
HCT: 29.6 % — ABNORMAL LOW (ref 36.0–46.0)
Hemoglobin: 9.8 g/dL — ABNORMAL LOW (ref 12.0–15.0)
MCH: 32.8 pg (ref 26.0–34.0)
MCHC: 33.1 g/dL (ref 30.0–36.0)
MCV: 99 fL (ref 80.0–100.0)
Platelets: 158 10*3/uL (ref 150–400)
RBC: 2.99 MIL/uL — ABNORMAL LOW (ref 3.87–5.11)
RDW: 13.4 % (ref 11.5–15.5)
WBC: 14.7 10*3/uL — ABNORMAL HIGH (ref 4.0–10.5)
nRBC: 0 % (ref 0.0–0.2)

## 2021-02-20 LAB — LACTIC ACID, PLASMA: Lactic Acid, Venous: 1.3 mmol/L (ref 0.5–1.9)

## 2021-02-20 MED ORDER — DILTIAZEM HCL-DEXTROSE 125-5 MG/125ML-% IV SOLN (PREMIX)
5.0000 mg/h | INTRAVENOUS | Status: DC
  Administered 2021-02-20: 5 mg/h via INTRAVENOUS
  Filled 2021-02-20: qty 125

## 2021-02-20 MED ORDER — METOPROLOL TARTRATE 5 MG/5ML IV SOLN
2.5000 mg | Freq: Once | INTRAVENOUS | Status: AC
  Administered 2021-02-20: 2.5 mg via INTRAVENOUS
  Filled 2021-02-20: qty 5

## 2021-02-20 MED ORDER — HEPARIN BOLUS VIA INFUSION
2000.0000 [IU] | Freq: Once | INTRAVENOUS | Status: AC
  Administered 2021-02-20: 2000 [IU] via INTRAVENOUS
  Filled 2021-02-20: qty 2000

## 2021-02-20 MED ORDER — METOPROLOL TARTRATE 5 MG/5ML IV SOLN
INTRAVENOUS | Status: AC
  Filled 2021-02-20: qty 5

## 2021-02-20 MED ORDER — APIXABAN 5 MG PO TABS: 5.0000 mg | ORAL_TABLET | Freq: Two times a day (BID) | ORAL | Status: DC

## 2021-02-20 MED ORDER — METOPROLOL TARTRATE 5 MG/5ML IV SOLN
2.5000 mg | Freq: Once | INTRAVENOUS | Status: AC
  Administered 2021-02-20: 2.5 mg via INTRAVENOUS

## 2021-02-20 MED ORDER — APIXABAN 5 MG PO TABS
10.0000 mg | ORAL_TABLET | Freq: Two times a day (BID) | ORAL | Status: DC
  Administered 2021-02-20: 10 mg via ORAL
  Filled 2021-02-20: qty 2

## 2021-02-20 MED ORDER — DILTIAZEM HCL ER COATED BEADS 120 MG PO CP24
120.0000 mg | ORAL_CAPSULE | Freq: Every day | ORAL | Status: DC
  Administered 2021-02-20 – 2021-02-22 (×3): 120 mg via ORAL
  Filled 2021-02-20 (×3): qty 1

## 2021-02-20 MED ORDER — IOHEXOL 9 MG/ML PO SOLN
500.0000 mL | Freq: Once | ORAL | Status: AC
  Administered 2021-02-20: 500 mL via ORAL

## 2021-02-20 NOTE — Progress Notes (Signed)
ANTICOAGULATION CONSULT NOTE  Pharmacy Consult for Eliquis  Indication: ischemic right colon c/f potential underlying mesenteric thrombosis  Patient Measurements: Height: 5\' 7"  (170.2 cm) Weight: 72.6 kg (160 lb) IBW/kg (Calculated) : 61.6 Heparin Dosing Weight: 68 kg   Vital Signs: Temp: 98 F (36.7 C) (07/08 1723) Temp Source: Oral (07/08 1104) BP: 153/57 (07/08 1723) Pulse Rate: 70 (07/08 1723)  Labs: Recent Labs    02/17/21 1823 02/18/21 0101 02/18/21 0719 02/18/21 0953 02/19/21 0631 02/19/21 1704 02/20/21 0113 02/20/21 0842  HGB 12.4  --  11.5*  --  10.3*  --  9.8*  --   HCT 35.7*  --  34.0*  --  30.1*  --  29.6*  --   PLT 210  --  191  --  154  --  158  --   APTT  --  24  --   --   --   --   --   --   LABPROT  --  13.2  --   --   --   --   --   --   INR  --  1.0  --   --   --   --   --   --   HEPARINUNFRC  --   --   --    < > 0.18* 0.61 0.32 0.14*  CREATININE 1.95*  --  2.14*  --  1.62*  --  1.40*  --   TROPONINIHS 6  --   --   --   --   --   --   --    < > = values in this interval not displayed.     Estimated Creatinine Clearance: 25.5 mL/min (A) (by C-G formula based on SCr of 1.4 mg/dL (H)).   Medical History: Past Medical History:  Diagnosis Date   Atrial fibrillation ([place]) 2010   One time during hospital while sick with severe diarrhea   C. difficile colitis    3/10  Severe C. dif ---had brief atrial fib then. Cath shows some blockages but no intervention   CAD (coronary artery disease) 2010   minor blockages --no intervention indicated   Hx of colonic polyps    Hyperlipidemia    Hypertension     Assessment: Pharmacy consulted to dose heparin in this 87 year old female w/ h/o HTN, HLD, parAFib (on ASA w/o AC), CAD and C.diff colitis admitted with ischemic right colon and c/f potential underlying mesenteric thrombosis. No prior anticoag noted. H&H, platelets trending down. Pharmacy has been consulted to transition from heparin to Eliquis.    Date Time Anti-Xa Rate/Comment 07/06 0953 0.38  No change; 800 un/hr   07/06 1832 0.30  No change; 800 units/hr 07/07 0631 0.18  800 > 1000 units/hr 07/07 1704 0.61  No change; 1000 units/hr 07/08   0113    0.32                1000 units/hr, therapeutic X 2  07/08 0842 0.14  1000 units/hr, subtherapeutic.   Goal of Therapy:  anti-Xa level 0.3-0.7 units/ml Monitor platelets by anticoagulation protocol: Yes   Plan:  Stop heparin gtt and start Eliquis 10 mg BID x 7 days, followed by 5 mg BID Monitor weekly CBC, s/s of bleed   Darnelle Bos, PharmD Clinical Pharmacist   02/20/2021,5:30 PM

## 2021-02-20 NOTE — TOC Progression Note (Signed)
Transition of Care [facility]) - Progression Note    Patient Details  Name: Jamie Hodges MRN: [id number] Date of Birth: 16-May-1928  Transition of Care [facility]) CM/SW [place], [place] Phone Number: 02/20/2021, 2:36 PM  Clinical Narrative:     CSW met with patient son at bedside to review discharge planning, as previously family was deciding appropriate discharge venue for patient amongst hospice house, returning to ILF with hospice, and going to SNF side of [facility] with hospice.   Patient's son reports they are pending another CT scan result to see if they misinterpreted her clinical course since she seems somewhat improved today per son.  Family decision for discharge venue will be pending those results and after his younger brother's wife arrives from [place] tomorrow morning.   Reports he will keep treatment team updated on their decision.   Should hospice home referral need to be made on weekend, weekend staff should call [phone].     Expected Discharge Plan: [place] (vs SNF with hospice vs Hospice house) Barriers to Discharge: Continued Medical Work up  Expected Discharge Plan and Services Expected Discharge Plan: [place] (vs SNF with hospice vs Hospice house)     Post Acute Care Choice:  (Hospice. Venue TBD.) Living arrangements for the past 2 months: [place]                                       Social Determinants of Health (SDOH) Interventions    Readmission Risk Interventions No flowsheet data found.

## 2021-02-20 NOTE — Progress Notes (Signed)
ANTICOAGULATION CONSULT NOTE  Pharmacy Consult for Heparin  Indication: ischemic right colon c/f potential underlying mesenteric thrombosis  Patient Measurements: Height: 5\' 7"  (170.2 cm) Weight: 68 kg (150 lb) IBW/kg (Calculated) : 61.6 Heparin Dosing Weight: 68 kg   Vital Signs: Temp: 98.1 F (36.7 C) (07/08 0012) Temp Source: Oral (07/08 0012) BP: 139/70 (07/08 0012) Pulse Rate: 98 (07/08 0012)  Labs: Recent Labs    02/17/21 1823 02/18/21 0101 02/18/21 0719 02/18/21 0953 02/19/21 0631 02/19/21 1704 02/20/21 0113  HGB 12.4  --  11.5*  --  10.3*  --  9.8*  HCT 35.7*  --  34.0*  --  30.1*  --  29.6*  PLT 210  --  191  --  154  --  158  APTT  --  24  --   --   --   --   --   LABPROT  --  13.2  --   --   --   --   --   INR  --  1.0  --   --   --   --   --   HEPARINUNFRC  --   --   --    < > 0.18* 0.61 0.32  CREATININE 1.95*  --  2.14*  --  1.62*  --  1.40*  TROPONINIHS 6  --   --   --   --   --   --    < > = values in this interval not displayed.     Estimated Creatinine Clearance: 25.5 mL/min (A) (by C-G formula based on SCr of 1.4 mg/dL (H)).   Medical History: Past Medical History:  Diagnosis Date   Atrial fibrillation ([place]) 2010   One time during hospital while sick with severe diarrhea   C. difficile colitis    3/10  Severe C. dif ---had brief atrial fib then. Cath shows some blockages but no intervention   CAD (coronary artery disease) 2010   minor blockages --no intervention indicated   Hx of colonic polyps    Hyperlipidemia    Hypertension     Medications:  Heparin Dosing Weight: 68 kg   Assessment: Pharmacy consulted to dose heparin in this 87 year old female w/ h/o HTN, HLD, parAFib (on ASA w/o AC), CAD and C.diff colitis admitted with ischemic right colon and c/f potential underlying mesenteric thrombosis. No prior anticoag noted. H&H, platelets trending down  Date Time Anti-Xa Rate/Comment 07/06 0953 0.38  No change; 800  un/hr   07/06 1832 0.30  No change; 800 units/hr 07/07 0631 0.18  800 > 1000 units/hr 07/07 1704 0.61  No change; 1000 units/hr 07/08   0113    0.32                1000 units/hr, therapeutic X 2   Goal of Therapy:  anti-Xa level 0.3-0.7 units/ml Monitor platelets by anticoagulation protocol: Yes   Plan:  7/8:  HL @ 0113 = 0.32, therapeutic X 2  HL is still therapeutic but has fallen ~ 0.3 in 8 hrs and is borderline subtherapeutic.  Will recheck HL in 8 hrs.   Orene Desanctis, PharmD Clinical Pharmacist   02/20/2021,2:15 AM

## 2021-02-20 NOTE — Progress Notes (Signed)
Patient ID: Jamie Hodges, female   DOB: 04-14-1928, 87 y.o.   MRN: [id number] [facility] PROGRESS NOTE  Sehaj Kolden XBW:[id number] DOB: [birth year] DOA: 02/17/2021 PCP: [name], MD  HPI/Subjective: Patient seen this morning.  Pain comes and goes.  Abdomen is distended.  Still with some nausea and vomiting.  Not able to eat much.  Still having bowel movements but no blood seen.  Admitted with ischemic bowel.  Objective: Vitals:   02/20/21 0813 02/20/21 1104  BP: (!) 131/59 (!) 149/53  Pulse: 85 75  Resp: 18 18  Temp: 98.7 F (37.1 C) 98.8 F (37.1 C)  SpO2: 93% 96%    Intake/Output Summary (Last 24 hours) at 02/20/2021 1524 Last data filed at 02/20/2021 1200 Gross per 24 hour  Intake 2621.3 ml  Output 300 ml  Net 2321.3 ml   Filed Weights   02/17/21 1827 02/20/21 0012  Weight: 68 kg 72.6 kg    ROS: Review of Systems  Respiratory:  Negative for shortness of breath.   Cardiovascular:  Negative for chest pain.  Gastrointestinal:  Positive for abdominal pain, nausea and vomiting.  Exam: Physical Exam HENT:     Head: Normocephalic.  Eyes:     General: Lids are normal.     Conjunctiva/sclera: Conjunctivae normal.  Cardiovascular:     Rate and Rhythm: Normal rate and regular rhythm.     Heart sounds: Normal heart sounds, S1 normal and S2 normal.  Pulmonary:     Breath sounds: No decreased breath sounds, wheezing, rhonchi or rales.  Abdominal:     Palpations: Abdomen is soft.     Tenderness: There is generalized abdominal tenderness.  Musculoskeletal:     Right lower leg: No swelling.     Left lower leg: No swelling.  Skin:    General: Skin is warm.     Findings: No rash.  Neurological:     Mental Status: She is alert and oriented to person, place, and time.     Data Reviewed: Basic Metabolic Panel: Recent Labs  Lab 02/17/21 1823 02/18/21 0719 02/19/21 0631 02/20/21 0113  NA 138 138 139 139  K 4.8 4.8 4.3 3.8  CL 107 108 109 112*  CO2 23 21* 23  20*  GLUCOSE 150* 152* 155* 121*  BUN 47* 54* 42* 28*  CREATININE 1.95* 2.14* 1.62* 1.40*  CALCIUM 9.3 8.5* 8.3* 8.3*   Liver Function Tests: Recent Labs  Lab 02/17/21 1823 02/18/21 0719  AST 19 30  ALT 15 19  ALKPHOS 73 64  BILITOT 1.0 1.0  PROT 7.4 6.1*  ALBUMIN 4.2 3.4*   Recent Labs  Lab 02/17/21 1823  LIPASE 32   CBC: Recent Labs  Lab 02/17/21 1823 02/18/21 0719 02/19/21 0631 02/20/21 0113  WBC 7.5 20.1* 16.1* 14.7*  HGB 12.4 11.5* 10.3* 9.8*  HCT 35.7* 34.0* 30.1* 29.6*  MCV 93.9 96.3 94.4 99.0  PLT 210 191 154 158     Recent Results (from the past 240 hour(s))  Resp Panel by RT-PCR (Flu A&B, Covid) Nasopharyngeal Swab     Status: None   Collection Time: 02/18/21 12:55 AM   Specimen: Nasopharyngeal Swab; Nasopharyngeal(NP) swabs in vial transport medium  Result Value Ref Range Status   SARS Coronavirus 2 by RT PCR NEGATIVE NEGATIVE Final    Comment: (NOTE) SARS-CoV-2 target nucleic acids are NOT DETECTED.  The SARS-CoV-2 RNA is generally detectable in upper respiratory specimens during the acute phase of infection. The lowest concentration of SARS-CoV-2 viral copies  this assay can detect is 138 copies/mL. A negative result does not preclude SARS-Cov-2 infection and should not be used as the sole basis for treatment or other patient management decisions. A negative result may occur with  improper specimen collection/handling, submission of specimen other than nasopharyngeal swab, presence of viral mutation(s) within the areas targeted by this assay, and inadequate number of viral copies(<138 copies/mL). A negative result must be combined with clinical observations, patient history, and epidemiological information. The expected result is Negative.  Fact Sheet for Patients:  EntrepreneurPulse.com.au  Fact Sheet for Healthcare Providers:  IncredibleEmployment.be  This test is no t yet approved or cleared by the  [place] FDA and  has been authorized for detection and/or diagnosis of SARS-CoV-2 by FDA under an Emergency Use Authorization (EUA). This EUA will remain  in effect (meaning this test can be used) for the duration of the COVID-19 declaration under Section 564(b)(1) of the Act, 21 U.S.C.section 360bbb-3(b)(1), unless the authorization is terminated  or revoked sooner.       Influenza A by PCR NEGATIVE NEGATIVE Final   Influenza B by PCR NEGATIVE NEGATIVE Final    Comment: (NOTE) The Xpert Xpress SARS-CoV-2/FLU/RSV plus assay is intended as an aid in the diagnosis of influenza from Nasopharyngeal swab specimens and should not be used as a sole basis for treatment. Nasal washings and aspirates are unacceptable for Xpert Xpress SARS-CoV-2/FLU/RSV testing.  Fact Sheet for Patients: EntrepreneurPulse.com.au  Fact Sheet for Healthcare Providers: IncredibleEmployment.be  This test is not yet approved or cleared by the [place] FDA and has been authorized for detection and/or diagnosis of SARS-CoV-2 by FDA under an Emergency Use Authorization (EUA). This EUA will remain in effect (meaning this test can be used) for the duration of the COVID-19 declaration under Section 564(b)(1) of the Act, 21 U.S.C. section 360bbb-3(b)(1), unless the authorization is terminated or revoked.  Performed at [facility], [address] [place], [place] [address]      Studies: CT ABDOMEN PELVIS WO CONTRAST  Result Date: 02/20/2021 CLINICAL DATA:  Shortness of breath, feeling unwell, rapid atrial fibrillation, ischemic bowel refusing surgery EXAM: CT ABDOMEN AND PELVIS WITHOUT CONTRAST TECHNIQUE: Multidetector CT imaging of the abdomen and pelvis was performed following the standard protocol without IV contrast. Sagittal and coronal MPR images reconstructed from axial data set. Patient drank dilute oral contrast for exam COMPARISON:  02/18/2021  FINDINGS: Lower chest: Tiny bibasilar pleural effusions and minimal atelectasis Hepatobiliary: Gallbladder surgically absent. Liver normal appearance. Intrahepatic portal venous gas seen on the prior CT exam no longer identified. Pancreas: Normal appearance Spleen: Normal appearance.  Tiny splenule anterior to spleen. Adrenals/Urinary Tract: Adrenal glands, kidneys, ureters, and bladder normal appearance Stomach/Bowel: Appendix not visualized. Persistent wall thickening of the proximal colon, from tip of cecum through mid transverse colon, consistent with colitis. Resolution of pneumatosis seen on previous exam at proximal colon no extra colonic gas. Diverticulosis of descending and sigmoid colon without evidence of diverticulitis. Stomach decompressed. Small bowel loops unremarkable. Vascular/Lymphatic: Atherosclerotic calcifications aorta and iliac arteries without aneurysm. No adenopathy. Reproductive: Atrophic uterus and ovaries. Other: Free fluid in pelvis new since previous exam. No free intraperitoneal air. Small amount of fluid/thickening at the LEFT pericolic gutter and lateral conal fascia. No definite hernia. Musculoskeletal: Osseous demineralization. IMPRESSION: Persistent wall thickening of the proximal colon from tip of cecum through mid transverse colon consistent with colitis; this could be seen with ischemia, infection or inflammatory bowel disease, though the presence of preceding colonic  pneumatosis on this study from 02/18/2021 favors Interval resolution of previously identified portal venous gas. Distal colonic diverticulosis without evidence of diverticulitis. New small amount of nonspecific free pelvic fluid. Tiny bibasilar pleural effusions and minimal atelectasis. Aortic Atherosclerosis (ICD10-I70.0). Electronically Signed   By: Lavonia Dana M.D.   On: 02/20/2021 15:07   DG Chest Port 1 View  Result Date: 02/20/2021 CLINICAL DATA:  Shortness of breath EXAM: PORTABLE CHEST 1 VIEW  COMPARISON:  CTA chest dated 07/11/2019 FINDINGS: Lungs are clear.  No pleural effusion or pneumothorax. The heart is normal in size.  Thoracic aortic atherosclerosis. IMPRESSION: No evidence of acute cardiopulmonary disease. Electronically Signed   By: Julian Hy M.D.   On: 02/20/2021 00:59    Scheduled Meds:  diltiazem  120 mg Oral Q1500   metoprolol  200 mg Oral Daily   Continuous Infusions:  sodium chloride 75 mL/hr at 02/20/21 0046   cefTRIAXone (ROCEPHIN)  IV 2 g (02/20/21 0916)   diltiazem (CARDIZEM) infusion 5 mg/hr (02/20/21 1414)   heparin 1,150 Units/hr (02/20/21 1057)   metronidazole 500 mg (02/20/21 1055)   promethazine (PHENERGAN) injection (IM or IVPB)     sodium chloride Stopped (02/18/21 0404)    Assessment/Plan:  Acute colonic ischemia.  Wondering if this is from paroxysmal atrial fibrillation.  Seems a little bit better with the heparin drip.  Repeat CT scan does not show pneumatosis at this point.  Still shows inflammation of the colon which could be ischemic or infectious in nature.  Unable to do CT scan with contrast at this point.  Change heparin drip over to high-dose Eliquis just in case clot.  Continue empiric antibiotics for now.  Full liquid diet today.  Patient will still need to eat and tolerate diet in order to get out of the hospital. Acute kidney injury on chronic kidney disease stage IIIb.  Creatinine 2.4 at its peak and came down to 1.40. Lactic acid has improved with IV fluids Paroxysmal atrial fibrillation.  Needed to be placed on Cardizem drip last night.  We will start oral Cardizem CD and try to taper off drip.  Continue high-dose Toprol-XL Essential hypertension.  Continue Toprol-XL and Cardizem CD. Weakness we will get physical therapy evaluation.    Code Status:     Code Status Orders  (From admission, onward)           Start     Ordered   02/18/21 0401  Do not attempt resuscitation (DNR)  Continuous       Question Answer Comment   In the event of cardiac or respiratory ARREST Do not call a "code blue"   In the event of cardiac or respiratory ARREST Do not perform Intubation, CPR, defibrillation or ACLS   In the event of cardiac or respiratory ARREST Use medication by any route, position, wound care, and other measures to relive pain and suffering. May use oxygen, suction and manual treatment of airway obstruction as needed for comfort.      02/18/21 0403           Code Status History     Date Active Date Inactive Code Status Order ID Comments User Context   02/18/2021 0141 02/18/2021 0403 DNR [id number]  Ward, Delice Bison, DO ED   06/23/2018 1726 06/24/2018 1512 DNR [id number]  Hillary Bow, MD ED      Advance Directive Documentation    Flowsheet Row Most Recent Value  Type of Advance Directive Healthcare Power of Attorney  Pre-existing out  of facility DNR order (yellow form or pink MOST form) --  "MOST" Form in Place? --      Family Communication: Spoke with son at the bedside this morning and on the phone this afternoon. Disposition Plan: Status is: Inpatient  Dispo: The patient is from: Home              Anticipated d/c is to: Likely back to [facility] but will have to tolerate diet and have less pain prior to disposition.              Patient currently repeat CBC scan shows little improvement.  Still with colitis but no pneumatosis seen.  Continue empiric antibiotics and switch heparin drip over to Eliquis.   Difficult to place patient.  No  Consultants: General surgery  Antibiotics: Rocephin Flagyl  Time spent: 27 minutes  [place]

## 2021-02-20 NOTE — Progress Notes (Signed)
ANTICOAGULATION CONSULT NOTE  Pharmacy Consult for Heparin  Indication: ischemic right colon c/f potential underlying mesenteric thrombosis  Patient Measurements: Height: 5\' 7"  (170.2 cm) Weight: 72.6 kg (160 lb) IBW/kg (Calculated) : 61.6 Heparin Dosing Weight: 68 kg   Vital Signs: Temp: 98.7 F (37.1 C) (07/08 0813) Temp Source: Oral (07/08 0813) BP: 131/59 (07/08 0813) Pulse Rate: 85 (07/08 0813)  Labs: Recent Labs    02/17/21 1823 02/18/21 0101 02/18/21 0719 02/18/21 1761 02/19/21 0631 02/19/21 1704 02/20/21 0113 02/20/21 0842  HGB 12.4  --  11.5*  --  10.3*  --  9.8*  --   HCT 35.7*  --  34.0*  --  30.1*  --  29.6*  --   PLT 210  --  191  --  154  --  158  --   APTT  --  24  --   --   --   --   --   --   LABPROT  --  13.2  --   --   --   --   --   --   INR  --  1.0  --   --   --   --   --   --   HEPARINUNFRC  --   --   --    < > 0.18* 0.61 0.32 0.14*  CREATININE 1.95*  --  2.14*  --  1.62*  --  1.40*  --   TROPONINIHS 6  --   --   --   --   --   --   --    < > = values in this interval not displayed.     Estimated Creatinine Clearance: 25.5 mL/min (A) (by C-G formula based on SCr of 1.4 mg/dL (H)).   Medical History: Past Medical History:  Diagnosis Date   Atrial fibrillation ([place]) 2010   One time during hospital while sick with severe diarrhea   C. difficile colitis    3/10  Severe C. dif ---had brief atrial fib then. Cath shows some blockages but no intervention   CAD (coronary artery disease) 2010   minor blockages --no intervention indicated   Hx of colonic polyps    Hyperlipidemia    Hypertension     Assessment: Pharmacy consulted to dose heparin in this 87 year old female w/ h/o HTN, HLD, parAFib (on ASA w/o AC), CAD and C.diff colitis admitted with ischemic right colon and c/f potential underlying mesenteric thrombosis. No prior anticoag noted. H&H, platelets trending down  Date Time Anti-Xa Rate/Comment 07/06 0953 0.38  No change; 800  un/hr   07/06 1832 0.30  No change; 800 units/hr 07/07 0631 0.18  800 > 1000 units/hr 07/07 1704 0.61  No change; 1000 units/hr 07/08   0113    0.32                1000 units/hr, therapeutic X 2  07/08 0842 0.14  1000 units/hr, subtherapeutic.   Goal of Therapy:  anti-Xa level 0.3-0.7 units/ml Monitor platelets by anticoagulation protocol: Yes   Plan:  Heparin level is subtherapeutic. Will give 2000 units bolus and increase heparin infusion to 1150 units/hr. Recheck heparin level in 8 hours. CBC daily while on heparin.   Oswald Hillock, PharmD Clinical Pharmacist   02/20/2021,9:50 AM

## 2021-02-20 NOTE — Progress Notes (Signed)
Witnessed fall by NT , Patient states she was on the Bedside commode and " the floor hit her " . golf ball size hematoma to left eye brow. Abrasion to right knuckles, ecchymosis to bilateral forearms .  Marland Kitchen Alert X 4. NIH completed and intact. BP updated in chart. Discussed with son in detail at bedside. He would like MD to have neurology see patient before discharge.

## 2021-02-20 NOTE — Progress Notes (Addendum)
When entered room 259, pt found face down on the floor. Nurse tech, Isa Rankin, standing beside BSC. Pt was assessed by this RN. Cranial nerves intact, No changes in mentation, cognitive function and pt is alert and oriented x4. When assessed, patient able to freely move joints: hip, knees, feet, shoulders and forearm with ease and no pain. When asked what happen pt stated "it felt like some one hit me on the back of the head" and pt is complaining of dizziness. Only injuries observed is small swollen area above the right forehead, large bruising to both forearms, when assessed pt c/o tenderness, 3/10. Pt with no neurological deficits to arms, hands and fingers. Also noted two small abrasion about 3cm to right hand above index finger and under right nostril. Scant blood noted to both sites. Both areas cleaned, and foam dressing to right hand. Pt able to get up off floor with minimal assistance. Pt in bed.  Vitals stable. NSR on monitor 80's. No ectopy or rhythm changes noted since 2000.   Notified H. Damita Dunnings MD at 2022 of the incident and STAT CT of the head and xray of both forearms noted. Pt agreeable to tests.  Called CT and Xray made aware of STAT orders.   Notified son Mikki Santee at 2056 and explained how his mother was found, vitals stable, no changes in heart rhythm or mentation. Informed of findings with assessment and pending CT/Xray ordered. Informed son that she was and is complaining of dizziness. Son states "she gets dizzy a lot and she takes medication for it". Again reassured him that his mother is in no significant pain,

## 2021-02-20 NOTE — Progress Notes (Signed)
PROGRESS NOTE    Jamie Hodges  UDJ:[id number] DOB: 21-Dec-1927 DOA: 02/17/2021 PCP: [name], MD  Cross coverage progress note  Brief Narrative:  Patient admitted with ischemic bowel, declining surgery.  Currently on a heparin infusion  Patient complained of shortness of breath and feeling unwell and noticed to be in rapid A. fib with rate up to 167.  She denied chest pain   Patient was seen and evaluated at bedside prior to administration of medication.  She was clear that she was DNR and did not want any procedures or heroic interventions.  I spoke with her son who is an OB/GYN over the phone.    Assessment & Plan: Rapid A. fib -She was administered metoprolol IV 2.5 mg x 2 doses without response.  Her son presented about 30 minutes later at the bedside.  I again spoke with him.  He was agreeable to having patient transferred to the PCU for Cardizem infusion.  I spoke to him about the chance of going into heart block given that patient was on a high oral dose of Toprol and already received metoprolol.  He advised that should this happen he wanted no intervention and that his mother had already made peace and was ready to go.  Her priest administered last rites earlier in the day. -Continue Cardizem infusion - Patient remains DNR - Family clear no heroic interventions  Active Problems:   Essential hypertension   AF (paroxysmal atrial fibrillation) (HCC)   Ischemic bowel disease ([place])   Colonic ischemia ([place])  CRITICAL CARE Performed by: Athena Masse   Total critical care time: 60 minutes  Critical care time was exclusive of separately billable procedures and treating other patients.  Critical care was necessary to treat or prevent imminent or life-threatening deterioration.  Critical care was time spent personally by me on the following activities: development of treatment plan with patient and/or surrogate as well as nursing, discussions with consultants, evaluation of  patient's response to treatment, examination of patient, obtaining history from patient or surrogate, ordering and performing treatments and interventions, ordering and review of laboratory studies, ordering and review of radiographic studies, pulse oximetry and re-evaluation of patient's condition.  Objective: Vitals:   02/20/21 0327 02/20/21 0351 02/20/21 0423 02/20/21 0500  BP: 120/78 109/64 107/69 114/74  Pulse: (!) 149 (!) 128 (!) 122 (!) 124  Resp: 20 18  (!) 29  Temp:      TempSrc:      SpO2:      Weight:      Height:        Intake/Output Summary (Last 24 hours) at 02/20/2021 0601 Last data filed at 02/20/2021 0400 Gross per 24 hour  Intake 2381.3 ml  Output 300 ml  Net 2081.3 ml   Filed Weights   02/17/21 1827 02/20/21 0012  Weight: 68 kg 72.6 kg    Examination:  General exam: Appears calm and comfortable, mild conversational dyspnea Respiratory system: Clear to auscultation. Respiratory effort normal. Cardiovascular system: S1 & S2 heard, RRR. No JVD, murmurs, rubs, gallops or clicks. No pedal edema. Gastrointestinal system: Abdomen is nondistended, soft and nontender. No organomegaly or masses felt. Normal bowel sounds heard. Central nervous system: Alert and oriented. No focal neurological deficits. Extremities: Symmetric 5 x 5 power. Skin: No rashes, lesions or ulcers Psychiatry: Judgement and insight appear normal. Mood & affect appropriate.     Data Reviewed: I have personally reviewed following labs and imaging studies  CBC: Recent Labs  Lab  02/17/21 1823 02/18/21 0719 02/19/21 0631 02/20/21 0113  WBC 7.5 20.1* 16.1* 14.7*  HGB 12.4 11.5* 10.3* 9.8*  HCT 35.7* 34.0* 30.1* 29.6*  MCV 93.9 96.3 94.4 99.0  PLT 210 191 154 532   Basic Metabolic Panel: Recent Labs  Lab 02/17/21 1823 02/18/21 0719 02/19/21 0631 02/20/21 0113  NA 138 138 139 139  K 4.8 4.8 4.3 3.8  CL 107 108 109 112*  CO2 23 21* 23 20*  GLUCOSE 150* 152* 155* 121*  BUN 47* 54*  42* 28*  CREATININE 1.95* 2.14* 1.62* 1.40*  CALCIUM 9.3 8.5* 8.3* 8.3*   GFR: Estimated Creatinine Clearance: 25.5 mL/min (A) (by C-G formula based on SCr of 1.4 mg/dL (H)). Liver Function Tests: Recent Labs  Lab 02/17/21 1823 02/18/21 0719  AST 19 30  ALT 15 19  ALKPHOS 73 64  BILITOT 1.0 1.0  PROT 7.4 6.1*  ALBUMIN 4.2 3.4*   Recent Labs  Lab 02/17/21 1823  LIPASE 32   No results for input(s): AMMONIA in the last 168 hours. Coagulation Profile: Recent Labs  Lab 02/18/21 0101  INR 1.0   Cardiac Enzymes: No results for input(s): CKTOTAL, CKMB, CKMBINDEX, TROPONINI in the last 168 hours. BNP (last 3 results) No results for input(s): PROBNP in the last 8760 hours. HbA1C: No results for input(s): HGBA1C in the last 72 hours. CBG: No results for input(s): GLUCAP in the last 168 hours. Lipid Profile: No results for input(s): CHOL, HDL, LDLCALC, TRIG, CHOLHDL, LDLDIRECT in the last 72 hours. Thyroid Function Tests: No results for input(s): TSH, T4TOTAL, FREET4, T3FREE, THYROIDAB in the last 72 hours. Anemia Panel: No results for input(s): VITAMINB12, FOLATE, FERRITIN, TIBC, IRON, RETICCTPCT in the last 72 hours. Sepsis Labs: Recent Labs  Lab 02/18/21 0055 02/20/21 0113  LATICACIDVEN 2.0* 1.3    Recent Results (from the past 240 hour(s))  Resp Panel by RT-PCR (Flu A&B, Covid) Nasopharyngeal Swab     Status: None   Collection Time: 02/18/21 12:55 AM   Specimen: Nasopharyngeal Swab; Nasopharyngeal(NP) swabs in vial transport medium  Result Value Ref Range Status   SARS Coronavirus 2 by RT PCR NEGATIVE NEGATIVE Final    Comment: (NOTE) SARS-CoV-2 target nucleic acids are NOT DETECTED.  The SARS-CoV-2 RNA is generally detectable in upper respiratory specimens during the acute phase of infection. The lowest concentration of SARS-CoV-2 viral copies this assay can detect is 138 copies/mL. A negative result does not preclude SARS-Cov-2 infection and should not be  used as the sole basis for treatment or other patient management decisions. A negative result may occur with  improper specimen collection/handling, submission of specimen other than nasopharyngeal swab, presence of viral mutation(s) within the areas targeted by this assay, and inadequate number of viral copies(<138 copies/mL). A negative result must be combined with clinical observations, patient history, and epidemiological information. The expected result is Negative.  Fact Sheet for Patients:  EntrepreneurPulse.com.au  Fact Sheet for Healthcare Providers:  IncredibleEmployment.be  This test is no t yet approved or cleared by the [place] FDA and  has been authorized for detection and/or diagnosis of SARS-CoV-2 by FDA under an Emergency Use Authorization (EUA). This EUA will remain  in effect (meaning this test can be used) for the duration of the COVID-19 declaration under Section 564(b)(1) of the Act, 21 U.S.C.section 360bbb-3(b)(1), unless the authorization is terminated  or revoked sooner.       Influenza A by PCR NEGATIVE NEGATIVE Final   Influenza B by PCR NEGATIVE  NEGATIVE Final    Comment: (NOTE) The Xpert Xpress SARS-CoV-2/FLU/RSV plus assay is intended as an aid in the diagnosis of influenza from Nasopharyngeal swab specimens and should not be used as a sole basis for treatment. Nasal washings and aspirates are unacceptable for Xpert Xpress SARS-CoV-2/FLU/RSV testing.  Fact Sheet for Patients: EntrepreneurPulse.com.au  Fact Sheet for Healthcare Providers: IncredibleEmployment.be  This test is not yet approved or cleared by the [place] FDA and has been authorized for detection and/or diagnosis of SARS-CoV-2 by FDA under an Emergency Use Authorization (EUA). This EUA will remain in effect (meaning this test can be used) for the duration of the COVID-19 declaration under Section  564(b)(1) of the Act, 21 U.S.C. section 360bbb-3(b)(1), unless the authorization is terminated or revoked.  Performed at [facility], [address], [place], [place] [address]          Radiology Studies: DG Chest [place] 1 View  Result Date: 02/20/2021 CLINICAL DATA:  Shortness of breath EXAM: PORTABLE CHEST 1 VIEW COMPARISON:  CTA chest dated 07/11/2019 FINDINGS: Lungs are clear.  No pleural effusion or pneumothorax. The heart is normal in size.  Thoracic aortic atherosclerosis. IMPRESSION: No evidence of acute cardiopulmonary disease. Electronically Signed   By: Julian Hy M.D.   On: 02/20/2021 00:59        Scheduled Meds:  metoprolol  200 mg Oral Daily   metoprolol tartrate       Continuous Infusions:  sodium chloride 75 mL/hr at 02/20/21 0046   cefTRIAXone (ROCEPHIN)  IV 2 g (02/19/21 1502)   diltiazem (CARDIZEM) infusion 7.5 mg/hr (02/20/21 0328)   heparin 1,000 Units/hr (02/20/21 0539)   metronidazole 500 mg (02/20/21 0211)   promethazine (PHENERGAN) injection (IM or IVPB)     sodium chloride Stopped (02/18/21 0404)     LOS: 2 days    Time spent: [place], MD [facility]

## 2021-02-21 DIAGNOSIS — N179 Acute kidney failure, unspecified: Secondary | ICD-10-CM | POA: Diagnosis not present

## 2021-02-21 DIAGNOSIS — R55 Syncope and collapse: Secondary | ICD-10-CM | POA: Diagnosis not present

## 2021-02-21 DIAGNOSIS — E872 Acidosis: Secondary | ICD-10-CM | POA: Diagnosis not present

## 2021-02-21 DIAGNOSIS — D638 Anemia in other chronic diseases classified elsewhere: Secondary | ICD-10-CM

## 2021-02-21 DIAGNOSIS — D649 Anemia, unspecified: Secondary | ICD-10-CM

## 2021-02-21 DIAGNOSIS — K559 Vascular disorder of intestine, unspecified: Secondary | ICD-10-CM | POA: Diagnosis not present

## 2021-02-21 LAB — BASIC METABOLIC PANEL
Anion gap: 5 (ref 5–15)
BUN: 22 mg/dL (ref 8–23)
CO2: 19 mmol/L — ABNORMAL LOW (ref 22–32)
Calcium: 7.9 mg/dL — ABNORMAL LOW (ref 8.9–10.3)
Chloride: 112 mmol/L — ABNORMAL HIGH (ref 98–111)
Creatinine, Ser: 1.27 mg/dL — ABNORMAL HIGH (ref 0.44–1.00)
GFR, Estimated: 40 mL/min — ABNORMAL LOW (ref >60)
Glucose, Bld: 90 mg/dL (ref 70–99)
Potassium: 3.7 mmol/L (ref 3.5–5.1)
Sodium: 136 mmol/L (ref 135–145)

## 2021-02-21 LAB — CBC
HCT: 27.2 % — ABNORMAL LOW (ref 36.0–46.0)
Hemoglobin: 9.1 g/dL — ABNORMAL LOW (ref 12.0–15.0)
MCH: 32.7 pg (ref 26.0–34.0)
MCHC: 33.5 g/dL (ref 30.0–36.0)
MCV: 97.8 fL (ref 80.0–100.0)
Platelets: 141 10*3/uL — ABNORMAL LOW (ref 150–400)
RBC: 2.78 MIL/uL — ABNORMAL LOW (ref 3.87–5.11)
RDW: 13.3 % (ref 11.5–15.5)
WBC: 7.1 10*3/uL (ref 4.0–10.5)
nRBC: 0 % (ref 0.0–0.2)

## 2021-02-21 MED ORDER — LOPERAMIDE HCL 2 MG PO CAPS
4.0000 mg | ORAL_CAPSULE | Freq: Three times a day (TID) | ORAL | Status: DC | PRN
  Administered 2021-02-21 – 2021-02-22 (×2): 4 mg via ORAL
  Filled 2021-02-21 (×2): qty 2

## 2021-02-21 MED ORDER — SODIUM CHLORIDE 0.9 % IV SOLN
INTRAVENOUS | Status: DC | PRN
  Administered 2021-02-21 – 2021-02-23 (×4): 1000 mL via INTRAVENOUS

## 2021-02-21 NOTE — Evaluation (Signed)
Occupational Therapy Evaluation Patient Details Name: Jamie Hodges MRN: [id number] DOB: 1927-11-27 Today's Date: 02/21/2021    History of Present Illness Pt is a 87 y/o F admitteed on 02/17/21 with c/c of abdominal pain & N&V x 3 days. Pt being treated for ischemic bowel with pt declining surgery. During hospital stay pt also found to be in rapid a-fib after pt c/o SOB & feeling unwell. Pt c/o dizziness throughout hospital stay as well, as experienced a fall in her room. PMH: HTN, dyslipidemia, paroxysmal a-fib on aspirin, CAD   Clinical Impression   Pt seen for OT evaluation this date in setting of acute hospitalization d/t ischemic bowel. Pt presents this date with decreased balance and is currently requiring MNI A for ADL transfers and all standing self care as she is at an increased fall risk. Pt lives in [place] at baseline alone. Anticipate she will require STR f/u therapies to reduce fall risk. Recommending SNF at this time, but will continue to assess for progress.     Follow Up Recommendations  SNF    Equipment Recommendations  3 in 1 bedside commode;Tub/shower seat;Other (comment) (2ww)    Recommendations for Other Services       Precautions / Restrictions Precautions Precautions: Fall Restrictions Weight Bearing Restrictions: No      Mobility Bed Mobility Overal bed mobility: Needs Assistance Bed Mobility: Supine to Sit;Sit to Supine     Supine to sit: HOB elevated;Supervision Sit to supine: Supervision   General bed mobility comments: reliance on bed rails    Transfers Overall transfer level: Needs assistance Equipment used: None Transfers: Sit to/from [employer] Transfers Sit to Stand: Min guard Stand pivot transfers: Min guard;Min assist       General transfer comment: requires at least unilateral support, she doesn;'t prefer AD, but is noted to be reaching for support and cannot tolerate challenge    Balance Overall balance assessment: Needs  assistance Sitting-balance support: Feet supported;No upper extremity supported Sitting balance-Leahy Scale: Good       Standing balance-Leahy Scale: Fair                             ADL either performed or assessed with clinical judgement   ADL Overall ADL's : Needs assistance/impaired                                       General ADL Comments: INDEP with sitting UB ADLs, MIN A with steanding LB ADLs d/t decreased balance     Vision Patient Visual Report: No change from baseline       Perception     Praxis      Pertinent Vitals/Pain Pain Assessment: No/denies pain     Hand Dominance     Extremity/Trunk Assessment Upper Extremity Assessment Upper Extremity Assessment: Overall WFL for tasks assessed;Generalized weakness   Lower Extremity Assessment Lower Extremity Assessment: Overall WFL for tasks assessed;Generalized weakness       Communication Communication Communication: No difficulties   Cognition Arousal/Alertness: Awake/alert Behavior During Therapy: WFL for tasks assessed/performed Overall Cognitive Status: Within Functional Limits for tasks assessed                                 General Comments: Very pleasant lady   General Comments  Exercises Other Exercises Other Exercises: ed re: role of OT   Shoulder Instructions      Home Living Family/patient expects to be discharged to::  (ILF cottage at [facility]) Living Arrangements: Alone                                      Prior Functioning/Environment Level of Independence: Independent        Comments: Independent without AD, driving, no falls        OT Problem List: Decreased strength;Decreased activity tolerance;Impaired balance (sitting and/or standing)      OT Treatment/Interventions: Self-care/ADL training;Therapeutic activities;Therapeutic exercise;Balance training    OT Goals(Current goals can be found in the  care plan section) Acute Rehab OT Goals Patient Stated Goal: go home, return to PLOF OT Goal Formulation: With patient Time For Goal Achievement: 03/07/21 Potential to Achieve Goals: Good ADL Goals Pt Will Transfer to Toilet: with supervision;ambulating (with LRAD) Pt Will Perform Toileting - Clothing Manipulation and hygiene: with supervision;sit to/from stand Pt/caregiver will Perform Home Exercise Program: Increased strength;Both right and left upper extremity;With Supervision  OT Frequency: Min 1X/week   Barriers to D/C:            Co-evaluation              AM-PAC OT "6 Clicks" Daily Activity     Outcome Measure Help from another person eating meals?: None Help from another person taking care of personal grooming?: A Little Help from another person toileting, which includes using toliet, bedpan, or urinal?: A Little Help from another person bathing (including washing, rinsing, drying)?: A Little Help from another person to put on and taking off regular upper body clothing?: None Help from another person to put on and taking off regular lower body clothing?: A Little 6 Click Score: 20   End of Session Equipment Utilized During Treatment: Gait belt;Rolling walker  Activity Tolerance: Patient tolerated treatment well Patient left: in bed;with call bell/phone within reach;with bed alarm set  OT Visit Diagnosis: Unsteadiness on feet (R26.81);Muscle weakness (generalized) (M62.81)                Time: 7510-2585 OT Time Calculation (min): 18 min Charges:  OT General Charges $OT Visit: 1 Visit OT Evaluation $OT Eval Moderate Complexity: 1 Mod OT Treatments $Self Care/Home Management : 8-22 mins  Gerrianne Scale, MS, OTR/L ascom [phone] 02/21/21, 3:17 PM

## 2021-02-21 NOTE — Progress Notes (Signed)
Patient ID: Jamie Hodges, female   DOB: [birth year], 87 y.o.   MRN: [id number] [facility] PROGRESS NOTE  Jamie Hodges WUJ:[id number] DOB: 1927/12/08 DOA: 02/17/2021 PCP: [name], MD  HPI/Subjective: Patient was on the commode yesterday and ended up falling to the ground and hitting her head.  Had some slight blood from her nose.  This morning feeling better.  She did not have any symptoms of near syncope.  The next thing she knew she ended up on the ground.  She states she did not pass out.  Initially admitted with colonic ischemia.  Abdomen is feeling better today.  Interested in trying solid food.  Objective: Vitals:   02/21/21 0331 02/21/21 0741  BP: (!) 148/70 (!) 167/69  Pulse: 77 81  Resp:    Temp: 98 F (36.7 C) 98.9 F (37.2 C)  SpO2: 98% 98%    Intake/Output Summary (Last 24 hours) at 02/21/2021 0806 Last data filed at 02/21/2021 0600 Gross per 24 hour  Intake 870 ml  Output 3 ml  Net 867 ml   Filed Weights   02/17/21 1827 02/20/21 0012  Weight: 68 kg 72.6 kg    ROS: Review of Systems  Respiratory:  Negative for shortness of breath.   Cardiovascular:  Negative for chest pain.  Gastrointestinal:  Positive for abdominal pain. Negative for nausea and vomiting.  Exam: Physical Exam HENT:     Head: Normocephalic.     Mouth/Throat:     Pharynx: No oropharyngeal exudate.  Eyes:     General: Lids are normal.     Conjunctiva/sclera: Conjunctivae normal.  Cardiovascular:     Rate and Rhythm: Normal rate and regular rhythm.     Heart sounds: Normal heart sounds, S1 normal and S2 normal.  Pulmonary:     Breath sounds: No decreased breath sounds, wheezing, rhonchi or rales.  Abdominal:     Palpations: Abdomen is soft.     Tenderness: There is no abdominal tenderness.     Comments: Generalized tenderness but a lot less than yesterday.  Musculoskeletal:     Right ankle: No swelling.     Left ankle: No swelling.  Skin:    General: Skin is warm.     Findings:  No rash.  Neurological:     Mental Status: She is alert and oriented to person, place, and time.     Comments: Able to move all extremities without a problem.     Data Reviewed: Basic Metabolic Panel: Recent Labs  Lab 02/17/21 1823 02/18/21 0719 02/19/21 0631 02/20/21 0113 02/21/21 0421  NA 138 138 139 139 136  K 4.8 4.8 4.3 3.8 3.7  CL 107 108 109 112* 112*  CO2 23 21* 23 20* 19*  GLUCOSE 150* 152* 155* 121* 90  BUN 47* 54* 42* 28* 22  CREATININE 1.95* 2.14* 1.62* 1.40* 1.27*  CALCIUM 9.3 8.5* 8.3* 8.3* 7.9*   Liver Function Tests: Recent Labs  Lab 02/17/21 1823 02/18/21 0719  AST 19 30  ALT 15 19  ALKPHOS 73 64  BILITOT 1.0 1.0  PROT 7.4 6.1*  ALBUMIN 4.2 3.4*   Recent Labs  Lab 02/17/21 1823  LIPASE 32   CBC: Recent Labs  Lab 02/17/21 1823 02/18/21 0719 02/19/21 0631 02/20/21 0113 02/21/21 0421  WBC 7.5 20.1* 16.1* 14.7* 7.1  HGB 12.4 11.5* 10.3* 9.8* 9.1*  HCT 35.7* 34.0* 30.1* 29.6* 27.2*  MCV 93.9 96.3 94.4 99.0 97.8  PLT 210 191 154 158 141*  Recent Results (from the past 240 hour(s))  Resp Panel by RT-PCR (Flu A&B, Covid) Nasopharyngeal Swab     Status: None   Collection Time: 02/18/21 12:55 AM   Specimen: Nasopharyngeal Swab; Nasopharyngeal(NP) swabs in vial transport medium  Result Value Ref Range Status   SARS Coronavirus 2 by RT PCR NEGATIVE NEGATIVE Final    Comment: (NOTE) SARS-CoV-2 target nucleic acids are NOT DETECTED.  The SARS-CoV-2 RNA is generally detectable in upper respiratory specimens during the acute phase of infection. The lowest concentration of SARS-CoV-2 viral copies this assay can detect is 138 copies/mL. A negative result does not preclude SARS-Cov-2 infection and should not be used as the sole basis for treatment or other patient management decisions. A negative result may occur with  improper specimen collection/handling, submission of specimen other than nasopharyngeal swab, presence of viral mutation(s)  within the areas targeted by this assay, and inadequate number of viral copies(<138 copies/mL). A negative result must be combined with clinical observations, patient history, and epidemiological information. The expected result is Negative.  Fact Sheet for Patients:  EntrepreneurPulse.com.au  Fact Sheet for Healthcare Providers:  IncredibleEmployment.be  This test is no t yet approved or cleared by the [place] FDA and  has been authorized for detection and/or diagnosis of SARS-CoV-2 by FDA under an Emergency Use Authorization (EUA). This EUA will remain  in effect (meaning this test can be used) for the duration of the COVID-19 declaration under Section 564(b)(1) of the Act, 21 U.S.C.section 360bbb-3(b)(1), unless the authorization is terminated  or revoked sooner.       Influenza A by PCR NEGATIVE NEGATIVE Final   Influenza B by PCR NEGATIVE NEGATIVE Final    Comment: (NOTE) The Xpert Xpress SARS-CoV-2/FLU/RSV plus assay is intended as an aid in the diagnosis of influenza from Nasopharyngeal swab specimens and should not be used as a sole basis for treatment. Nasal washings and aspirates are unacceptable for Xpert Xpress SARS-CoV-2/FLU/RSV testing.  Fact Sheet for Patients: EntrepreneurPulse.com.au  Fact Sheet for Healthcare Providers: IncredibleEmployment.be  This test is not yet approved or cleared by the [place] FDA and has been authorized for detection and/or diagnosis of SARS-CoV-2 by FDA under an Emergency Use Authorization (EUA). This EUA will remain in effect (meaning this test can be used) for the duration of the COVID-19 declaration under Section 564(b)(1) of the Act, 21 U.S.C. section 360bbb-3(b)(1), unless the authorization is terminated or revoked.  Performed at [facility], [address] [place], [place] [address]      Studies: CT ABDOMEN PELVIS WO  CONTRAST  Result Date: 02/20/2021 CLINICAL DATA:  Shortness of breath, feeling unwell, rapid atrial fibrillation, ischemic bowel refusing surgery EXAM: CT ABDOMEN AND PELVIS WITHOUT CONTRAST TECHNIQUE: Multidetector CT imaging of the abdomen and pelvis was performed following the standard protocol without IV contrast. Sagittal and coronal MPR images reconstructed from axial data set. Patient drank dilute oral contrast for exam COMPARISON:  02/18/2021 FINDINGS: Lower chest: Tiny bibasilar pleural effusions and minimal atelectasis Hepatobiliary: Gallbladder surgically absent. Liver normal appearance. Intrahepatic portal venous gas seen on the prior CT exam no longer identified. Pancreas: Normal appearance Spleen: Normal appearance.  Tiny splenule anterior to spleen. Adrenals/Urinary Tract: Adrenal glands, kidneys, ureters, and bladder normal appearance Stomach/Bowel: Appendix not visualized. Persistent wall thickening of the proximal colon, from tip of cecum through mid transverse colon, consistent with colitis. Resolution of pneumatosis seen on previous exam at proximal colon no extra colonic gas. Diverticulosis of descending and sigmoid colon  without evidence of diverticulitis. Stomach decompressed. Small bowel loops unremarkable. Vascular/Lymphatic: Atherosclerotic calcifications aorta and iliac arteries without aneurysm. No adenopathy. Reproductive: Atrophic uterus and ovaries. Other: Free fluid in pelvis new since previous exam. No free intraperitoneal air. Small amount of fluid/thickening at the LEFT pericolic gutter and lateral conal fascia. No definite hernia. Musculoskeletal: Osseous demineralization. IMPRESSION: Persistent wall thickening of the proximal colon from tip of cecum through mid transverse colon consistent with colitis; this could be seen with ischemia, infection or inflammatory bowel disease, though the presence of preceding colonic pneumatosis on this study from 02/18/2021 favors Interval  resolution of previously identified portal venous gas. Distal colonic diverticulosis without evidence of diverticulitis. New small amount of nonspecific free pelvic fluid. Tiny bibasilar pleural effusions and minimal atelectasis. Aortic Atherosclerosis (ICD10-I70.0). Electronically Signed   By: Lavonia Dana M.D.   On: 02/20/2021 15:07   DG Forearm Left  Result Date: 02/20/2021 CLINICAL DATA:  Fall.  Pain to both forearms. EXAM: LEFT FOREARM - 2 VIEW COMPARISON:  None. FINDINGS: There is no evidence of fracture or other focal bone lesions. Visualized wrist and elbow grossly unremarkable. Soft tissues are unremarkable. Vascular calcifications. IV tubing overlying the distal forearm. IMPRESSION: No acute displaced fracture or dislocation. Electronically Signed   By: Iven Finn M.D.   On: 02/20/2021 21:08   DG Forearm Right  Result Date: 02/20/2021 CLINICAL DATA:  Fall. EXAM: RIGHT FOREARM - 2 VIEW COMPARISON:  None. FINDINGS: There is no evidence of fracture or other focal bone lesions. Visualized wrist and elbow grossly unremarkable. Soft tissues are unremarkable. Vascular calcifications. IV tubing overlying the distal forearm. IMPRESSION: Negative. Electronically Signed   By: Iven Finn M.D.   On: 02/20/2021 21:09   CT HEAD WO CONTRAST  Result Date: 02/20/2021 CLINICAL DATA:  Recent head trauma and weakness EXAM: CT HEAD WITHOUT CONTRAST TECHNIQUE: Contiguous axial images were obtained from the base of the skull through the vertex without intravenous contrast. COMPARISON:  06/23/2018 FINDINGS: Brain: Mild atrophic changes and chronic white matter ischemic changes are noted stable from the prior exam. Left-sided arachnoid cyst is seen lateral to the frontal lobe stable in appearance from the prior study. Bilateral basal ganglia calcifications are seen. No acute hemorrhage is noted. Vascular: No hyperdense vessel or unexpected calcification. Skull: Normal. Negative for fracture or focal lesion.  Sinuses/Orbits: No acute finding. Other: None. IMPRESSION: Chronic atrophic and ischemic changes stable from the previous exam. Stable left-sided arachnoid cyst. No acute intracranial abnormality noted. Electronically Signed   By: Inez Catalina M.D.   On: 02/20/2021 21:52   DG Chest Port 1 View  Result Date: 02/20/2021 CLINICAL DATA:  Shortness of breath EXAM: PORTABLE CHEST 1 VIEW COMPARISON:  CTA chest dated 07/11/2019 FINDINGS: Lungs are clear.  No pleural effusion or pneumothorax. The heart is normal in size.  Thoracic aortic atherosclerosis. IMPRESSION: No evidence of acute cardiopulmonary disease. Electronically Signed   By: Julian Hy M.D.   On: 02/20/2021 00:59    Scheduled Meds:  diltiazem  120 mg Oral Q1500   metoprolol  200 mg Oral Daily   Continuous Infusions:  cefTRIAXone (ROCEPHIN)  IV 2 g (02/20/21 0916)   metronidazole 500 mg (02/21/21 0300)   promethazine (PHENERGAN) injection (IM or IVPB)     sodium chloride Stopped (02/18/21 0404)    Assessment/Plan:  Vasovagal syncope last night while being on the commode.  Since she hit her head and being on blood thinner we will hold the blood thinner at this  point.  No arrhythmias noted on telemetry last night. Acute colonic ischemia could be infectious versus ischemic in nature.  Repeat CT scan did not show pneumatosis at this point.  Holding Eliquis with vasovagal syncope and head trauma right last night.  Continue empiric antibiotics.  Abdomen with less pain today Will advance diet and see how she does. Acute kidney injury on chronic kidney disease stage IIIb.  Creatinine peaked at 2.14 and improved to 1.27. Lactic acidosis improved with IV fluids Paroxysmal atrial fibrillation on Cardizem CD and Toprol-XL.  Holding Eliquis at this point secondary to syncope and head trauma.  Risk of stroke is higher being off blood thinner. Essential hypertension.  Continue Toprol-XL and Cardizem CD Weakness.  Physical therapy recommends  rehab. Anemia unspecified.  Send off iron studies tomorrow morning.  Hemoglobin drifting down with IV fluid hydration.  Discontinue IV fluids.  With fall now has bruising on her arm.        Code Status:     Code Status Orders  (From admission, onward)           Start     Ordered   02/18/21 0401  Do not attempt resuscitation (DNR)  Continuous       Question Answer Comment  In the event of cardiac or respiratory ARREST Do not call a "code blue"   In the event of cardiac or respiratory ARREST Do not perform Intubation, CPR, defibrillation or ACLS   In the event of cardiac or respiratory ARREST Use medication by any route, position, wound care, and other measures to relive pain and suffering. May use oxygen, suction and manual treatment of airway obstruction as needed for comfort.      02/18/21 0403           Code Status History     Date Active Date Inactive Code Status Order ID Comments User Context   02/18/2021 0141 02/18/2021 0403 DNR [id number]  Ward, Delice Bison, DO ED   06/23/2018 1726 06/24/2018 1512 DNR [id number]  Hillary Bow, MD ED      Advance Directive Documentation    Flowsheet Row Most Recent Value  Type of Advance Directive Healthcare Power of Attorney  Pre-existing out of facility DNR order (yellow form or pink MOST form) --  "MOST" Form in Place? --      Family Communication: Spoke with son on the phone this morning and outside the room late morning Disposition Plan: Status is: Inpatient  Dispo: The patient is from: Independent living              Anticipated d/c is to: Rehab              Patient currently had a syncopal episode while on Eliquis and hit her head.  Watch neuro status closely over the next 48 hours   Difficult to place patient.  No.  Time spent: 27 minutes  [place]

## 2021-02-21 NOTE — Evaluation (Addendum)
Physical Therapy Evaluation Patient Details Name: Jamie Hodges MRN: [id number] DOB: 25-Nov-1927 Today's Date: 02/21/2021   History of Present Illness  Pt is a 87 y/o F admitteed on 02/17/21 with c/c of abdominal pain & N&V x 3 days. Pt being treated for ischemic bowel with pt declining surgery. During hospital stay pt also found to be in rapid a-fib after pt c/o SOB & feeling unwell. Pt c/o dizziness throughout hospital stay as well, as experienced a fall in her room. PMH: HTN, dyslipidemia, paroxysmal a-fib on aspirin, CAD  Clinical Impression  Pt seen for PT evaluation with son present for session. Pt negative for orthostatic hypotension & only endorses slight dizziness after ambulating to bathroom with son but reports it dissipates once sitting. Pt is able to complete bed mobility with supervision but reliance on bed rails, transfers with min assist & gait around nurses station without AD with min assist 2/2 impaired balance. Pt's son requesting to assist pt with ambulation in room & educated him & pt on use of gait belt & they demonstrated ability to safely ambulate to bathroom door & back to recliner. At this time pt is not safe to d/c home alone & would benefit from ongoing PT services to address balance & endurance to reduce fall risk prior to return home alone. Recommending pt d/c to STR for additional therapies prior to return home to [facility].   BP checked in LUE: Supine: 154/57 mmHg (MAP 85) Sitting: 149/65 mmHg (MAP 90) Standing at 0: 159/79 mmHg (MAP 100) Sitting in chair after gait in room: 146/68 mmHg (MAP 93) SpO2 >90% on room air (pt c/o SOB after gait, PT educated pt on pursed lip breathing) HR 81 bpm at rest, up to 111 bpm after gait      Follow Up Recommendations SNF;Supervision/Assistance - 24 hour    Equipment Recommendations   (TBD in next venue)    Recommendations for Other Services   OT consult    Precautions / Restrictions Precautions Precautions:  Fall Restrictions Weight Bearing Restrictions: No      Mobility  Bed Mobility Overal bed mobility: Needs Assistance Bed Mobility: Supine to Sit     Supine to sit: HOB elevated;Supervision     General bed mobility comments: reliance on bed rails    Transfers Overall transfer level: Needs assistance Equipment used: None Transfers: Sit to/from [employer] Transfers Sit to Stand: Min guard Stand pivot transfers: Min assist       General transfer comment: Pt holding to bed rails/chair armrests  Ambulation/Gait Ambulation/Gait assistance: Min assist Gait Distance (Feet): 170 Feet Assistive device: None Gait Pattern/deviations: Decreased stride length;Decreased step length - right;Decreased step length - left Gait velocity: decreased   General Gait Details: posterior bias during gait  Stairs            Wheelchair Mobility    Modified Rankin (Stroke Patients Only)       Balance Overall balance assessment: Needs assistance Sitting-balance support: Feet supported;No upper extremity supported Sitting balance-Leahy Scale: Good       Standing balance-Leahy Scale: Fair                               Pertinent Vitals/Pain Pain Assessment: No/denies pain    Home Living Family/patient expects to be discharged to::  (ILF cottage at [facility]) Living Arrangements: Alone  Prior Function Level of Independence: Independent         Comments: Independent without AD, driving, no falls     Hand Dominance        Extremity/Trunk Assessment   Upper Extremity Assessment Upper Extremity Assessment: Overall WFL for tasks assessed    Lower Extremity Assessment Lower Extremity Assessment: Generalized weakness       Communication   Communication: No difficulties  Cognition Arousal/Alertness: Awake/alert Behavior During Therapy: WFL for tasks assessed/performed Overall Cognitive Status: Within Functional Limits  for tasks assessed                                 General Comments: Very pleasant lady      General Comments      Exercises     Assessment/Plan    PT Assessment Patient needs continued PT services  PT Problem List Decreased strength;Decreased mobility;Decreased activity tolerance;Decreased balance       PT Treatment Interventions DME instruction;Therapeutic exercise;Gait training;Balance training;Functional mobility training;Neuromuscular re-education;Modalities;Therapeutic activities;Patient/family education    PT Goals (Current goals can be found in the Care Plan section)  Acute Rehab PT Goals Patient Stated Goal: go home, return to PLOF PT Goal Formulation: With patient/family Time For Goal Achievement: 03/07/21 Potential to Achieve Goals: Good    Frequency Min 2X/week   Barriers to discharge Decreased caregiver support lives alone    Co-evaluation               AM-PAC PT "6 Clicks" Mobility  Outcome Measure Help needed turning from your back to your side while in a flat bed without using bedrails?: A Little Help needed moving from lying on your back to sitting on the side of a flat bed without using bedrails?: A Little Help needed moving to and from a bed to a chair (including a wheelchair)?: A Little Help needed standing up from a chair using your arms (e.g., wheelchair or bedside chair)?: A Little Help needed to walk in hospital room?: A Little Help needed climbing 3-5 steps with a railing? : A Lot 6 Click Score: 17    End of Session Equipment Utilized During Treatment: Gait belt Activity Tolerance: Patient tolerated treatment well Patient left: in chair;with call bell/phone within reach;with family/visitor present Nurse Communication: Mobility status PT Visit Diagnosis: Unsteadiness on feet (R26.81);Muscle weakness (generalized) (M62.81)    Time: 1025-8527 PT Time Calculation (min) (ACUTE ONLY): 39 min   Charges:   PT  Evaluation $PT Eval Low Complexity: 1 Low PT Treatments $Therapeutic Activity: 23-37 mins        Lavone Nian, PT, DPT 02/21/21, 9:32 AM   Waunita Schooner 02/21/2021, 9:27 AM

## 2021-02-22 DIAGNOSIS — N179 Acute kidney failure, unspecified: Secondary | ICD-10-CM | POA: Diagnosis not present

## 2021-02-22 DIAGNOSIS — K559 Vascular disorder of intestine, unspecified: Secondary | ICD-10-CM | POA: Diagnosis not present

## 2021-02-22 DIAGNOSIS — E872 Acidosis: Secondary | ICD-10-CM | POA: Diagnosis not present

## 2021-02-22 DIAGNOSIS — R55 Syncope and collapse: Secondary | ICD-10-CM | POA: Diagnosis not present

## 2021-02-22 DIAGNOSIS — D638 Anemia in other chronic diseases classified elsewhere: Secondary | ICD-10-CM

## 2021-02-22 LAB — BASIC METABOLIC PANEL
Anion gap: 4 — ABNORMAL LOW (ref 5–15)
BUN: 15 mg/dL (ref 8–23)
CO2: 23 mmol/L (ref 22–32)
Calcium: 8.3 mg/dL — ABNORMAL LOW (ref 8.9–10.3)
Chloride: 113 mmol/L — ABNORMAL HIGH (ref 98–111)
Creatinine, Ser: 1.02 mg/dL — ABNORMAL HIGH (ref 0.44–1.00)
GFR, Estimated: 52 mL/min — ABNORMAL LOW (ref >60)
Glucose, Bld: 92 mg/dL (ref 70–99)
Potassium: 3.8 mmol/L (ref 3.5–5.1)
Sodium: 140 mmol/L (ref 135–145)

## 2021-02-22 LAB — IRON AND TIBC
Iron: 49 ug/dL (ref 28–170)
Saturation Ratios: 28 % (ref 10.4–31.8)
TIBC: 176 ug/dL — ABNORMAL LOW (ref 250–450)
UIBC: 127 ug/dL

## 2021-02-22 LAB — CBC
HCT: 30.3 % — ABNORMAL LOW (ref 36.0–46.0)
Hemoglobin: 10.3 g/dL — ABNORMAL LOW (ref 12.0–15.0)
MCH: 32.7 pg (ref 26.0–34.0)
MCHC: 34 g/dL (ref 30.0–36.0)
MCV: 96.2 fL (ref 80.0–100.0)
Platelets: 194 10*3/uL (ref 150–400)
RBC: 3.15 MIL/uL — ABNORMAL LOW (ref 3.87–5.11)
RDW: 13.2 % (ref 11.5–15.5)
WBC: 7.2 10*3/uL (ref 4.0–10.5)
nRBC: 0 % (ref 0.0–0.2)

## 2021-02-22 LAB — FERRITIN: Ferritin: 85 ng/mL (ref 11–307)

## 2021-02-22 MED ORDER — CHOLESTYRAMINE 4 G PO PACK
4.0000 g | PACK | Freq: Every day | ORAL | Status: DC
  Administered 2021-02-22: 4 g via ORAL
  Filled 2021-02-22 (×2): qty 1

## 2021-02-22 MED ORDER — APIXABAN 5 MG PO TABS
5.0000 mg | ORAL_TABLET | Freq: Two times a day (BID) | ORAL | Status: DC
  Administered 2021-02-22 – 2021-02-23 (×3): 5 mg via ORAL
  Filled 2021-02-22 (×3): qty 1

## 2021-02-22 MED ORDER — CEFDINIR 300 MG PO CAPS
300.0000 mg | ORAL_CAPSULE | Freq: Two times a day (BID) | ORAL | Status: DC
  Administered 2021-02-23: 300 mg via ORAL
  Filled 2021-02-22 (×2): qty 1

## 2021-02-22 NOTE — Progress Notes (Signed)
Patient ID: Jamie Hodges, female   DOB: 10-09-1927, 87 y.o.   MRN: [id number] [facility] PROGRESS NOTE  Shirleen Mcfaul DJT:[id number] DOB: May 07, 1928 DOA: 02/17/2021 PCP: [name], MD  HPI/Subjective: Patient feeling better this morning.  States that she is having some diarrhea.  No abdominal pain.  Tolerated eating a bagel this morning.  Admitted with colonic ischemia.  Objective: Vitals:   02/22/21 0808 02/22/21 1135  BP: (!) 157/69 (!) 144/74  Pulse: 83 79  Resp: 20 16  Temp: 98.3 F (36.8 C) 98.5 F (36.9 C)  SpO2: 97% 99%    Intake/Output Summary (Last 24 hours) at 02/22/2021 1257 Last data filed at 02/22/2021 1035 Gross per 24 hour  Intake 973.74 ml  Output --  Net 973.74 ml   Filed Weights   02/17/21 1827 02/20/21 0012  Weight: 68 kg 72.6 kg    ROS: Review of Systems  Respiratory:  Negative for shortness of breath.   Cardiovascular:  Negative for chest pain.  Gastrointestinal:  Positive for diarrhea. Negative for abdominal pain, nausea and vomiting.  Exam: Physical Exam HENT:     Head: Normocephalic.     Mouth/Throat:     Pharynx: No oropharyngeal exudate.  Eyes:     General: Lids are normal.     Conjunctiva/sclera: Conjunctivae normal.  Cardiovascular:     Rate and Rhythm: Normal rate and regular rhythm.     Heart sounds: Normal heart sounds, S1 normal and S2 normal.  Pulmonary:     Breath sounds: No decreased breath sounds, wheezing, rhonchi or rales.  Abdominal:     Palpations: Abdomen is soft.     Tenderness: There is no abdominal tenderness.  Musculoskeletal:     Right ankle: No swelling.     Left ankle: No swelling.  Skin:    General: Skin is warm.     Findings: No rash.  Neurological:     Mental Status: She is alert and oriented to person, place, and time.     Data Reviewed: Basic Metabolic Panel: Recent Labs  Lab 02/18/21 0719 02/19/21 0631 02/20/21 0113 02/21/21 0421 02/22/21 0409  NA 138 139 139 136 140  K 4.8 4.3 3.8  3.7 3.8  CL 108 109 112* 112* 113*  CO2 21* 23 20* 19* 23  GLUCOSE 152* 155* 121* 90 92  BUN 54* 42* 28* 22 15  CREATININE 2.14* 1.62* 1.40* 1.27* 1.02*  CALCIUM 8.5* 8.3* 8.3* 7.9* 8.3*   Liver Function Tests: Recent Labs  Lab 02/17/21 1823 02/18/21 0719  AST 19 30  ALT 15 19  ALKPHOS 73 64  BILITOT 1.0 1.0  PROT 7.4 6.1*  ALBUMIN 4.2 3.4*   Recent Labs  Lab 02/17/21 1823  LIPASE 32    CBC: Recent Labs  Lab 02/18/21 0719 02/19/21 0631 02/20/21 0113 02/21/21 0421 02/22/21 0409  WBC 20.1* 16.1* 14.7* 7.1 7.2  HGB 11.5* 10.3* 9.8* 9.1* 10.3*  HCT 34.0* 30.1* 29.6* 27.2* 30.3*  MCV 96.3 94.4 99.0 97.8 96.2  PLT 191 154 158 141* 194     Recent Results (from the past 240 hour(s))  Resp Panel by RT-PCR (Flu A&B, Covid) Nasopharyngeal Swab     Status: None   Collection Time: 02/18/21 12:55 AM   Specimen: Nasopharyngeal Swab; Nasopharyngeal(NP) swabs in vial transport medium  Result Value Ref Range Status   SARS Coronavirus 2 by RT PCR NEGATIVE NEGATIVE Final    Comment: (NOTE) SARS-CoV-2 target nucleic acids are NOT DETECTED.  The SARS-CoV-2 RNA  is generally detectable in upper respiratory specimens during the acute phase of infection. The lowest concentration of SARS-CoV-2 viral copies this assay can detect is 138 copies/mL. A negative result does not preclude SARS-Cov-2 infection and should not be used as the sole basis for treatment or other patient management decisions. A negative result may occur with  improper specimen collection/handling, submission of specimen other than nasopharyngeal swab, presence of viral mutation(s) within the areas targeted by this assay, and inadequate number of viral copies(<138 copies/mL). A negative result must be combined with clinical observations, patient history, and epidemiological information. The expected result is Negative.  Fact Sheet for Patients:  EntrepreneurPulse.com.au  Fact Sheet for  Healthcare Providers:  IncredibleEmployment.be  This test is no t yet approved or cleared by the [place] FDA and  has been authorized for detection and/or diagnosis of SARS-CoV-2 by FDA under an Emergency Use Authorization (EUA). This EUA will remain  in effect (meaning this test can be used) for the duration of the COVID-19 declaration under Section 564(b)(1) of the Act, 21 U.S.C.section 360bbb-3(b)(1), unless the authorization is terminated  or revoked sooner.       Influenza A by PCR NEGATIVE NEGATIVE Final   Influenza B by PCR NEGATIVE NEGATIVE Final    Comment: (NOTE) The Xpert Xpress SARS-CoV-2/FLU/RSV plus assay is intended as an aid in the diagnosis of influenza from Nasopharyngeal swab specimens and should not be used as a sole basis for treatment. Nasal washings and aspirates are unacceptable for Xpert Xpress SARS-CoV-2/FLU/RSV testing.  Fact Sheet for Patients: EntrepreneurPulse.com.au  Fact Sheet for Healthcare Providers: IncredibleEmployment.be  This test is not yet approved or cleared by the [place] FDA and has been authorized for detection and/or diagnosis of SARS-CoV-2 by FDA under an Emergency Use Authorization (EUA). This EUA will remain in effect (meaning this test can be used) for the duration of the COVID-19 declaration under Section 564(b)(1) of the Act, 21 U.S.C. section 360bbb-3(b)(1), unless the authorization is terminated or revoked.  Performed at [facility], [address] [place], [place] [address]      Studies: CT ABDOMEN PELVIS WO CONTRAST  Result Date: 02/20/2021 CLINICAL DATA:  Shortness of breath, feeling unwell, rapid atrial fibrillation, ischemic bowel refusing surgery EXAM: CT ABDOMEN AND PELVIS WITHOUT CONTRAST TECHNIQUE: Multidetector CT imaging of the abdomen and pelvis was performed following the standard protocol without IV contrast. Sagittal and coronal  MPR images reconstructed from axial data set. Patient drank dilute oral contrast for exam COMPARISON:  02/18/2021 FINDINGS: Lower chest: Tiny bibasilar pleural effusions and minimal atelectasis Hepatobiliary: Gallbladder surgically absent. Liver normal appearance. Intrahepatic portal venous gas seen on the prior CT exam no longer identified. Pancreas: Normal appearance Spleen: Normal appearance.  Tiny splenule anterior to spleen. Adrenals/Urinary Tract: Adrenal glands, kidneys, ureters, and bladder normal appearance Stomach/Bowel: Appendix not visualized. Persistent wall thickening of the proximal colon, from tip of cecum through mid transverse colon, consistent with colitis. Resolution of pneumatosis seen on previous exam at proximal colon no extra colonic gas. Diverticulosis of descending and sigmoid colon without evidence of diverticulitis. Stomach decompressed. Small bowel loops unremarkable. Vascular/Lymphatic: Atherosclerotic calcifications aorta and iliac arteries without aneurysm. No adenopathy. Reproductive: Atrophic uterus and ovaries. Other: Free fluid in pelvis new since previous exam. No free intraperitoneal air. Small amount of fluid/thickening at the LEFT pericolic gutter and lateral conal fascia. No definite hernia. Musculoskeletal: Osseous demineralization. IMPRESSION: Persistent wall thickening of the proximal colon from tip of cecum through mid transverse colon  consistent with colitis; this could be seen with ischemia, infection or inflammatory bowel disease, though the presence of preceding colonic pneumatosis on this study from 02/18/2021 favors Interval resolution of previously identified portal venous gas. Distal colonic diverticulosis without evidence of diverticulitis. New small amount of nonspecific free pelvic fluid. Tiny bibasilar pleural effusions and minimal atelectasis. Aortic Atherosclerosis (ICD10-I70.0). Electronically Signed   By: Lavonia Dana M.D.   On: 02/20/2021 15:07   DG  Forearm Left  Result Date: 02/20/2021 CLINICAL DATA:  Fall.  Pain to both forearms. EXAM: LEFT FOREARM - 2 VIEW COMPARISON:  None. FINDINGS: There is no evidence of fracture or other focal bone lesions. Visualized wrist and elbow grossly unremarkable. Soft tissues are unremarkable. Vascular calcifications. IV tubing overlying the distal forearm. IMPRESSION: No acute displaced fracture or dislocation. Electronically Signed   By: Iven Finn M.D.   On: 02/20/2021 21:08   DG Forearm Right  Result Date: 02/20/2021 CLINICAL DATA:  Fall. EXAM: RIGHT FOREARM - 2 VIEW COMPARISON:  None. FINDINGS: There is no evidence of fracture or other focal bone lesions. Visualized wrist and elbow grossly unremarkable. Soft tissues are unremarkable. Vascular calcifications. IV tubing overlying the distal forearm. IMPRESSION: Negative. Electronically Signed   By: Iven Finn M.D.   On: 02/20/2021 21:09   CT HEAD WO CONTRAST  Result Date: 02/20/2021 CLINICAL DATA:  Recent head trauma and weakness EXAM: CT HEAD WITHOUT CONTRAST TECHNIQUE: Contiguous axial images were obtained from the base of the skull through the vertex without intravenous contrast. COMPARISON:  06/23/2018 FINDINGS: Brain: Mild atrophic changes and chronic white matter ischemic changes are noted stable from the prior exam. Left-sided arachnoid cyst is seen lateral to the frontal lobe stable in appearance from the prior study. Bilateral basal ganglia calcifications are seen. No acute hemorrhage is noted. Vascular: No hyperdense vessel or unexpected calcification. Skull: Normal. Negative for fracture or focal lesion. Sinuses/Orbits: No acute finding. Other: None. IMPRESSION: Chronic atrophic and ischemic changes stable from the previous exam. Stable left-sided arachnoid cyst. No acute intracranial abnormality noted. Electronically Signed   By: Inez Catalina M.D.   On: 02/20/2021 21:52    Scheduled Meds:  apixaban  5 mg Oral BID   diltiazem  120 mg Oral  Q1500   metoprolol  200 mg Oral Daily   Continuous Infusions:  sodium chloride Stopped (02/22/21 0310)   cefTRIAXone (ROCEPHIN)  IV Stopped (02/22/21 1006)   metronidazole 100 mL/hr at 02/22/21 1035   promethazine (PHENERGAN) injection (IM or IVPB)     sodium chloride Stopped (02/18/21 0404)    Assessment/Plan:  Acute colonic ischemia which could be infectious versus ischemic in nature.  Initial CT scan showed pneumatosis.  Patient absolutely did not want surgery.  Repeat CT scan did not show pneumatosis but did show inflammation of the colon.  Initially was on heparin drip and then started on Eliquis.  We held Eliquis yesterday since the patient had a syncopal episode.  Restart Eliquis today.  Continue empiric antibiotics.  Tolerated breakfast this morning without any nausea or vomiting.  Anticipate discharge to [facility] rehab tomorrow. Vasovagal syncope while on the commode.  No arrhythmias on telemetry with the events. Acute kidney injury on chronic kidney disease stage IIIb.  Creatinine peaked at 2.14 and improved to 1.02 Lactic acidosis improved with fluids Paroxysmal atrial fibrillation.  Currently in normal sinus rhythm on Cardizem CD and Toprol-XL.  Restart Eliquis today Essential hypertension on Toprol-XL and Cardizem CD Anemia of chronic disease.  Today's  hemoglobin 10.3 Weakness.  Physical therapy recommends rehab.     Code Status:     Code Status Orders  (From admission, onward)           Start     Ordered   02/18/21 0401  Do not attempt resuscitation (DNR)  Continuous       Question Answer Comment  In the event of cardiac or respiratory ARREST Do not call a "code blue"   In the event of cardiac or respiratory ARREST Do not perform Intubation, CPR, defibrillation or ACLS   In the event of cardiac or respiratory ARREST Use medication by any route, position, wound care, and other measures to relive pain and suffering. May use oxygen, suction and manual treatment of  airway obstruction as needed for comfort.      02/18/21 0403           Code Status History     Date Active Date Inactive Code Status Order ID Comments User Context   02/18/2021 0141 02/18/2021 0403 DNR [id number]  Ward, Delice Bison, DO ED   06/23/2018 1726 06/24/2018 1512 DNR [id number]  Hillary Bow, MD ED      Advance Directive Documentation    Flowsheet Row Most Recent Value  Type of Advance Directive Healthcare Power of Attorney  Pre-existing out of facility DNR order (yellow form or pink MOST form) --  "MOST" Form in Place? --      Family Communication: Spoke with son at the bedside this morning and patient's other son on the phone also this morning. Disposition Plan: Status is: Inpatient  Dispo: The patient is from: [facility] independent living              Anticipated d/c is to: [facility] rehab likely tomorrow              Patient currently doing better as of this morning with no abdominal pain and tolerating diet   Difficult to place patient.  No.  Consultants: General surgery  Time spent: 26 minutes  [place]

## 2021-02-22 NOTE — TOC Progression Note (Signed)
Transition of Care [facility]) - Progression Note    Patient Details  Name: Alexza Norbeck MRN: [id number] Date of Birth: Dec 17, 1927  Transition of Care [facility]) CM/SW [place], LCSW Phone Number: 02/22/2021, 2:09 PM  Clinical Narrative: PT and OT recommended SNF. CSW met with patient and she confirmed this plan. CSW offered to call son but she said it wasn't necessary because he already knows about it. MD hoping to discharge tomorrow. Left message for [facility] admissions coordinator to notify.    Expected Discharge Plan: [place] (vs SNF with hospice vs Hospice house) Barriers to Discharge: Continued Medical Work up  Expected Discharge Plan and Services Expected Discharge Plan: [place] (vs SNF with hospice vs Hospice house)     Post Acute Care Choice:  (Hospice. Venue TBD.) Living arrangements for the past 2 months: [place]                                       Social Determinants of Health (SDOH) Interventions    Readmission Risk Interventions No flowsheet data found.

## 2021-02-22 NOTE — NC FL2 (Signed)
[place] LEVEL OF CARE SCREENING TOOL     IDENTIFICATION  Patient Name: Jamie Hodges Birthdate: 1928-07-11 Sex: female Admission Date (Current Location): 02/17/2021  [facility] and [place] Number:  Engineering geologist and Address:  [facility], [address] [place], [place] [address]      Provider Number: [id number]  Attending Physician Name and Address:  Loletha Grayer, MD  Relative Name and Phone Number:       Current Level of Care: Hospital Recommended Level of Care: [place] Prior Approval Number:    Date Approved/Denied:   PASRR Number: [id number]  Discharge Plan: SNF    Current Diagnoses: Patient Active Problem List   Diagnosis Date Noted   Syncope, vasovagal    Anemia of chronic disease    Weakness    Ischemic bowel disease ([place]) 02/18/2021   Colonic ischemia ([place]) 02/18/2021   Diarrhea    Acute kidney injury superimposed on CKD ([place])    Lactic acidosis    Right hip pain 03/17/2020   Wheezing 12/06/2019   Aortic atherosclerosis ([place]) 07/20/2019   Chronic renal disease, stage III ([place]) 02/23/2019   Arachnoid cyst 06/29/2018   Cerebrovascular disease 06/29/2018   Vertigo 06/23/2018   Preventative health care 02/17/2018   IBS (irritable bowel syndrome) 05/26/2016   Counseling regarding advanced directives 02/27/2014   Benign paroxysmal positional vertigo 05/11/2013   Chronic diarrhea 06/09/2011   Essential hypertension 09/16/2009   AF (paroxysmal atrial fibrillation) ([place]) 09/16/2009   DIVERTICULOSIS, COLON 09/16/2009    Orientation RESPIRATION BLADDER Height & Weight     Self, Time, Situation, Place  Normal Continent Weight: 160 lb (72.6 kg) Height:  5\' 7"  (170.2 cm)  BEHAVIORAL SYMPTOMS/MOOD NEUROLOGICAL BOWEL NUTRITION STATUS   (None)  (None) Incontinent Diet (Heart healthy)  AMBULATORY STATUS COMMUNICATION OF NEEDS Skin   Limited Assist Verbally Skin abrasions, Bruising, Other (Comment)  (Rash.)                       Personal Care Assistance Level of Assistance  Bathing, Feeding, Dressing Bathing Assistance: Limited assistance Feeding assistance: Independent Dressing Assistance: Limited assistance     Functional Limitations Info  Sight, Hearing, Speech Sight Info: Adequate Hearing Info: Adequate (Has hearing aides) Speech Info: Adequate    SPECIAL CARE FACTORS FREQUENCY  PT (By licensed PT), OT (By licensed OT)     PT Frequency: 5 x week OT Frequency: 5 x week            Contractures Contractures Info: Not present    Additional Factors Info  Code Status, Allergies Code Status Info: DNR Allergies Info: Codeine Sulfate, Wild rice, Crestor (Rosuvastatin Calcium), Penicillins           Current Medications (02/22/2021):  This is the current hospital active medication list Current Facility-Administered Medications  Medication Dose Route Frequency Provider Last Rate Last Admin   0.9 %  sodium chloride infusion   Intravenous PRN Loletha Grayer, MD   Stopped at 02/22/21 0310   acetaminophen (TYLENOL) tablet 650 mg  650 mg Oral Q6H PRN Mansy, Jan A, MD   650 mg at 02/22/21 1104   Or   acetaminophen (TYLENOL) suppository 650 mg  650 mg Rectal Q6H PRN Mansy, Jan A, MD       albuterol (PROVENTIL) (2.5 MG/3ML) 0.083% nebulizer solution 2.5 mg  2.5 mg Inhalation Q6H PRN Mansy, Arvella Merles, MD       apixaban (ELIQUIS) tablet 5 mg  5 mg Oral BID Loletha Grayer, MD   5 mg at 02/22/21 1018   [START ON 02/23/2021] cefdinir (OMNICEF) capsule 300 mg  300 mg Oral Q12H Wieting, Richard, MD       cholestyramine Lucrezia Starch) packet 4 g  4 g Oral Daily Loletha Grayer, MD   4 g at 02/22/21 1407   diltiazem (CARDIZEM CD) 24 hr capsule 120 mg  120 mg Oral Q1500 Loletha Grayer, MD   120 mg at 02/22/21 1407   EPINEPHrine (EPI-PEN) injection 0.3 mg  0.3 mg Intramuscular PRN Mansy, Jan A, MD       loperamide (IMODIUM) capsule 4 mg  4 mg Oral Q8H PRN Loletha Grayer, MD   4 mg at  02/22/21 0804   meclizine (ANTIVERT) tablet 12.5 mg  12.5 mg Oral TID PRN Loletha Grayer, MD       metoprolol succinate (TOPROL-XL) 24 hr tablet 200 mg  200 mg Oral Daily Loletha Grayer, MD   200 mg at 02/22/21 0931   metroNIDAZOLE (FLAGYL) IVPB 500 mg  500 mg Intravenous Q8H Mansy, Jan A, MD 100 mL/hr at 02/22/21 1035 Infusion Verify at 02/22/21 1035   morphine 2 MG/ML injection 2 mg  2 mg Intravenous Q2H PRN Mansy, Jan A, MD   2 mg at 02/19/21 9166   morphine CONCENTRATE 10 MG/0.5ML oral solution 5 mg  5 mg Oral Q2H PRN Loletha Grayer, MD   5 mg at 02/19/21 2234   ondansetron (ZOFRAN) injection 4 mg  4 mg Intravenous Q6H PRN Ward, Kristen N, DO   4 mg at 02/20/21 0049   promethazine (PHENERGAN) 6.25 mg in sodium chloride 0.9 % 50 mL IVPB  6.25 mg Intravenous Q6H PRN Wieting, Richard, MD       sodium chloride 0.9 % bolus 500 mL  500 mL Intravenous Once Ward, Delice Bison, DO   Held at 02/18/21 0404   traZODone (DESYREL) tablet 25 mg  25 mg Oral QHS PRN Mansy, Arvella Merles, MD         Discharge Medications: Please see discharge summary for a list of discharge medications.  Relevant Imaging Results:  Relevant Lab Results:   Additional Information SS#: [id number]  Candie Chroman, LCSW

## 2021-02-23 DIAGNOSIS — R7989 Other specified abnormal findings of blood chemistry: Secondary | ICD-10-CM | POA: Diagnosis not present

## 2021-02-23 DIAGNOSIS — R2681 Unsteadiness on feet: Secondary | ICD-10-CM | POA: Diagnosis not present

## 2021-02-23 DIAGNOSIS — R000 Tachycardia, unspecified: Secondary | ICD-10-CM | POA: Diagnosis not present

## 2021-02-23 DIAGNOSIS — I471 Supraventricular tachycardia: Secondary | ICD-10-CM | POA: Diagnosis present

## 2021-02-23 DIAGNOSIS — Z825 Family history of asthma and other chronic lower respiratory diseases: Secondary | ICD-10-CM | POA: Diagnosis not present

## 2021-02-23 DIAGNOSIS — E876 Hypokalemia: Secondary | ICD-10-CM | POA: Diagnosis present

## 2021-02-23 DIAGNOSIS — N1832 Chronic kidney disease, stage 3b: Secondary | ICD-10-CM | POA: Diagnosis not present

## 2021-02-23 DIAGNOSIS — R778 Other specified abnormalities of plasma proteins: Secondary | ICD-10-CM | POA: Diagnosis not present

## 2021-02-23 DIAGNOSIS — N183 Chronic kidney disease, stage 3 unspecified: Secondary | ICD-10-CM | POA: Diagnosis not present

## 2021-02-23 DIAGNOSIS — I700 Atherosclerosis of aorta: Secondary | ICD-10-CM | POA: Diagnosis present

## 2021-02-23 DIAGNOSIS — I129 Hypertensive chronic kidney disease with stage 1 through stage 4 chronic kidney disease, or unspecified chronic kidney disease: Secondary | ICD-10-CM | POA: Diagnosis present

## 2021-02-23 DIAGNOSIS — Z9049 Acquired absence of other specified parts of digestive tract: Secondary | ICD-10-CM | POA: Diagnosis not present

## 2021-02-23 DIAGNOSIS — N1831 Chronic kidney disease, stage 3a: Secondary | ICD-10-CM | POA: Diagnosis present

## 2021-02-23 DIAGNOSIS — I4891 Unspecified atrial fibrillation: Secondary | ICD-10-CM | POA: Diagnosis not present

## 2021-02-23 DIAGNOSIS — R109 Unspecified abdominal pain: Secondary | ICD-10-CM | POA: Diagnosis not present

## 2021-02-23 DIAGNOSIS — R278 Other lack of coordination: Secondary | ICD-10-CM | POA: Diagnosis not present

## 2021-02-23 DIAGNOSIS — Z87891 Personal history of nicotine dependence: Secondary | ICD-10-CM | POA: Diagnosis not present

## 2021-02-23 DIAGNOSIS — I248 Other forms of acute ischemic heart disease: Secondary | ICD-10-CM | POA: Diagnosis present

## 2021-02-23 DIAGNOSIS — Z66 Do not resuscitate: Secondary | ICD-10-CM | POA: Diagnosis present

## 2021-02-23 DIAGNOSIS — D638 Anemia in other chronic diseases classified elsewhere: Secondary | ICD-10-CM | POA: Diagnosis not present

## 2021-02-23 DIAGNOSIS — R079 Chest pain, unspecified: Secondary | ICD-10-CM | POA: Diagnosis not present

## 2021-02-23 DIAGNOSIS — K529 Noninfective gastroenteritis and colitis, unspecified: Secondary | ICD-10-CM | POA: Diagnosis not present

## 2021-02-23 DIAGNOSIS — Z888 Allergy status to other drugs, medicaments and biological substances status: Secondary | ICD-10-CM | POA: Diagnosis not present

## 2021-02-23 DIAGNOSIS — Z20822 Contact with and (suspected) exposure to covid-19: Secondary | ICD-10-CM | POA: Diagnosis present

## 2021-02-23 DIAGNOSIS — R55 Syncope and collapse: Secondary | ICD-10-CM | POA: Diagnosis not present

## 2021-02-23 DIAGNOSIS — I480 Paroxysmal atrial fibrillation: Secondary | ICD-10-CM | POA: Diagnosis present

## 2021-02-23 DIAGNOSIS — K559 Vascular disorder of intestine, unspecified: Secondary | ICD-10-CM | POA: Diagnosis present

## 2021-02-23 DIAGNOSIS — Z7901 Long term (current) use of anticoagulants: Secondary | ICD-10-CM | POA: Diagnosis not present

## 2021-02-23 DIAGNOSIS — Z91011 Allergy to milk products: Secondary | ICD-10-CM | POA: Diagnosis not present

## 2021-02-23 DIAGNOSIS — D631 Anemia in chronic kidney disease: Secondary | ICD-10-CM | POA: Diagnosis present

## 2021-02-23 DIAGNOSIS — Z741 Need for assistance with personal care: Secondary | ICD-10-CM | POA: Diagnosis not present

## 2021-02-23 DIAGNOSIS — K55032 Diffuse acute (reversible) ischemia of large intestine: Secondary | ICD-10-CM | POA: Diagnosis not present

## 2021-02-23 DIAGNOSIS — E785 Hyperlipidemia, unspecified: Secondary | ICD-10-CM | POA: Diagnosis present

## 2021-02-23 DIAGNOSIS — Z880 Allergy status to penicillin: Secondary | ICD-10-CM | POA: Diagnosis not present

## 2021-02-23 DIAGNOSIS — R531 Weakness: Secondary | ICD-10-CM | POA: Diagnosis not present

## 2021-02-23 DIAGNOSIS — I2510 Atherosclerotic heart disease of native coronary artery without angina pectoris: Secondary | ICD-10-CM | POA: Diagnosis present

## 2021-02-23 DIAGNOSIS — N189 Chronic kidney disease, unspecified: Secondary | ICD-10-CM | POA: Diagnosis not present

## 2021-02-23 DIAGNOSIS — M6281 Muscle weakness (generalized): Secondary | ICD-10-CM | POA: Diagnosis not present

## 2021-02-23 DIAGNOSIS — I10 Essential (primary) hypertension: Secondary | ICD-10-CM | POA: Diagnosis not present

## 2021-02-23 DIAGNOSIS — Z79899 Other long term (current) drug therapy: Secondary | ICD-10-CM | POA: Diagnosis not present

## 2021-02-23 DIAGNOSIS — N179 Acute kidney failure, unspecified: Secondary | ICD-10-CM | POA: Diagnosis not present

## 2021-02-23 LAB — RESP PANEL BY RT-PCR (FLU A&B, COVID) ARPGX2
Influenza A by PCR: NEGATIVE
Influenza B by PCR: NEGATIVE
SARS Coronavirus 2 by RT PCR: NEGATIVE

## 2021-02-23 MED ORDER — DILTIAZEM HCL 25 MG/5ML IV SOLN
10.0000 mg | Freq: Once | INTRAVENOUS | Status: DC
  Filled 2021-02-23: qty 5

## 2021-02-23 MED ORDER — DIPHENOXYLATE-ATROPINE 2.5-0.025 MG PO TABS
1.0000 | ORAL_TABLET | Freq: Once | ORAL | Status: AC
  Administered 2021-02-23: 1 via ORAL

## 2021-02-23 MED ORDER — APIXABAN 5 MG PO TABS: 5.0000 mg | ORAL_TABLET | Freq: Two times a day (BID) | ORAL | 0 refills | Status: DC

## 2021-02-23 MED ORDER — DILTIAZEM HCL 30 MG PO TABS
60.0000 mg | ORAL_TABLET | Freq: Once | ORAL | Status: AC
  Administered 2021-02-23: 60 mg via ORAL
  Filled 2021-02-23: qty 2

## 2021-02-23 MED ORDER — DILTIAZEM HCL 25 MG/5ML IV SOLN: 15.0000 mg | Freq: Once | INTRAVENOUS | Status: DC

## 2021-02-23 MED ORDER — PROMETHAZINE HCL 12.5 MG PO TABS: 12.5000 mg | ORAL_TABLET | Freq: Three times a day (TID) | ORAL | 0 refills | Status: DC | PRN

## 2021-02-23 MED ORDER — DILTIAZEM HCL ER COATED BEADS 240 MG PO CP24: 240.0000 mg | ORAL_CAPSULE | Freq: Every evening | ORAL | 0 refills | Status: DC

## 2021-02-23 MED ORDER — MECLIZINE HCL 12.5 MG PO TABS: 12.5000 mg | ORAL_TABLET | Freq: Three times a day (TID) | ORAL | 0 refills | Status: DC | PRN

## 2021-02-23 MED ORDER — CEFDINIR 300 MG PO CAPS: 300.0000 mg | ORAL_CAPSULE | Freq: Two times a day (BID) | ORAL | 0 refills | Status: DC

## 2021-02-23 MED ORDER — METRONIDAZOLE 500 MG PO TABS: 500.0000 mg | ORAL_TABLET | Freq: Three times a day (TID) | ORAL | 0 refills | Status: DC

## 2021-02-23 NOTE — Care Management Important Message (Signed)
Important Message  Patient Details  Name: Jamie Hodges MRN: [id number] Date of Birth: 1928/01/28   Medicare Important Message Given:  No  Patient discharged prior to arrival to unit to deliver concurrent Medicare IM.  Initial electronically signed by patient on 02/18/2021 at 3:59am.    Dannette Barbara 02/23/2021, 11:49 AM

## 2021-02-23 NOTE — Discharge Summary (Signed)
[facility] NAME: Jamie Hodges    MR#:  [id number]  DATE OF BIRTH:  [birth year]  DATE OF ADMISSION:  02/17/2021 ADMITTING PHYSICIAN: Loletha Grayer, MD  DATE OF DISCHARGE: 02/23/2021  PRIMARY CARE PHYSICIAN: [name], MD    ADMISSION DIAGNOSIS:  Ischemic bowel disease ([place]) [K55.9] Colonic ischemia ([place]) [K55.9]  DISCHARGE DIAGNOSIS:  1.  Acute colonic ischemia 2.  Vasovagal syncope 3.  Paroxysmal atrial fibrillation 4.  Acute kidney injury on chronic kidney disease stage 3a 5.  Lactic acidosis 6.  Essential hypertension 7.  Anemia of chronic disease 8.  Weakness  SECONDARY DIAGNOSIS:   Past Medical History:  Diagnosis Date   Atrial fibrillation ([place]) 2010   One time during hospital while sick with severe diarrhea   C. difficile colitis    3/10  Severe C. dif ---had brief atrial fib then. Cath shows some blockages but no intervention   CAD (coronary artery disease) 2010   minor blockages --no intervention indicated   Hx of colonic polyps    Hyperlipidemia    Hypertension     HOSPITAL COURSE:   Acute colonic ischemia.  Patient was admitted 02/18/2021 with abdominal pain.  Initial CT scan showed right colonic wall thickening with colonic pneumatosis and pericolonic edema.  Also seen was extensive portal venous gas demonstrated in the liver likely representing colonic ischemia.  Patient was started on empiric antibiotics with Rocephin and Flagyl.  Patient was started on empiric heparin drip.  The patient was seen by general surgery and absolutely refused any surgical intervention.  Consideration for hospice was thought about but since the patient has improved and repeat CT scan did show thickening of the colon but no pneumatosis the patient will go back to [facility] rehab portion.  Will finish up 1 week of antibiotic with oral Omnicef and Flagyl.  The patient was converted over to Eliquis for anticoagulation.  Patient  is a DNR.  Can consider hospice if declines.  If continues to improve hopefully can get back to her independent living Vasovagal syncope while on commode.  No arrhythmias on telemetry with that event. Paroxysmal atrial fibrillation.  Currently on normal sinus rhythm.  Continue Toprol-XL.  Since the patient did have rapid atrial fibrillation of 128 last night and converted to normal sinus rhythm prior to needing any intervention I will increase Cardizem CD to 240 mg daily.  Continue Eliquis for anticoagulation for stroke prevention Acute kidney injury on chronic kidney disease stage IIIa.  Creatinine peaked at 2.14 and improved to 1.02 Lactic acidosis improved with IV fluids Essential hypertension.  Continue Toprol-XL and Cardizem CD Anemia of chronic disease.  Last hemoglobin 10.3 Weakness.  Physical therapy recommends rehab  DISCHARGE CONDITIONS:   Satisfactory  CONSULTS OBTAINED:    General surgery  DRUG ALLERGIES:   Allergies  Allergen Reactions   Codeine Sulfate     REACTION: rash/ welps   Other     Wild rice   Crestor [Rosuvastatin Calcium]     myalgia   Penicillins Hives and Rash    Has patient had a PCN reaction causing immediate rash, facial/tongue/throat swelling, SOB or lightheadedness with hypotension: Yes Has patient had a PCN reaction causing severe rash involving mucus membranes or skin necrosis: No Has patient had a PCN reaction that required hospitalization: No Has patient had a PCN reaction occurring within the last 10 years: Yes If all of the above answers are "NO", then may proceed  with Cephalosporin use.    DISCHARGE MEDICATIONS:   Allergies as of 02/23/2021       Reactions   Codeine Sulfate    REACTION: rash/ welps   Other    Wild rice   Crestor [rosuvastatin Calcium]    myalgia   Penicillins Hives, Rash   Has patient had a PCN reaction causing immediate rash, facial/tongue/throat swelling, SOB or lightheadedness with hypotension: Yes Has patient had  a PCN reaction causing severe rash involving mucus membranes or skin necrosis: No Has patient had a PCN reaction that required hospitalization: No Has patient had a PCN reaction occurring within the last 10 years: Yes If all of the above answers are "NO", then may proceed with Cephalosporin use.        Medication List     STOP taking these medications    amLODipine 10 MG tablet Commonly known as: NORVASC   aspirin EC 81 MG tablet   hydrochlorothiazide 25 MG tablet Commonly known as: HYDRODIURIL   ibuprofen 200 MG tablet Commonly known as: ADVIL   mometasone 0.1 % cream Commonly known as: ELOCON   predniSONE 20 MG tablet Commonly known as: DELTASONE   Prevalite 4 g packet Generic drug: cholestyramine light   ramipril 10 MG capsule Commonly known as: ALTACE       TAKE these medications    acetaminophen 325 MG tablet Commonly known as: TYLENOL Take 650 mg by mouth every 6 (six) hours as needed.   albuterol 108 (90 Base) MCG/ACT inhaler Commonly known as: VENTOLIN HFA Inhale 2 puffs into the lungs every 6 (six) hours as needed for wheezing or shortness of breath.   apixaban 5 MG Tabs tablet Commonly known as: ELIQUIS Take 1 tablet (5 mg total) by mouth 2 (two) times daily.   cefdinir 300 MG capsule Commonly known as: OMNICEF Take 1 capsule (300 mg total) by mouth every 12 (twelve) hours for 5 doses.   diltiazem 240 MG 24 hr capsule Commonly known as: Cartia XT Take 1 capsule (240 mg total) by mouth every evening.   EPINEPHrine 0.3 mg/0.3 mL Soaj injection Commonly known as: EPI-PEN Inject 0.3 mg into the muscle as needed (for allergic reaction).   loperamide 2 MG capsule Commonly known as: IMODIUM Take 2 mg by mouth 3 (three) times daily as needed for diarrhea or loose stools.   meclizine 12.5 MG tablet Commonly known as: ANTIVERT Take 1 tablet (12.5 mg total) by mouth 3 (three) times daily as needed for dizziness or nausea. What changed:   medication strength See the new instructions.   metoprolol 200 MG 24 hr tablet Commonly known as: TOPROL-XL TAKE ONE TABLET BY MOUTH DAILY   metroNIDAZOLE 500 MG tablet Commonly known as: Flagyl Take 1 tablet (500 mg total) by mouth 3 (three) times daily for 8 doses.   promethazine 12.5 MG tablet Commonly known as: PHENERGAN Take 1 tablet (12.5 mg total) by mouth every 8 (eight) hours as needed for nausea or vomiting.         DISCHARGE INSTRUCTIONS:   Follow-up with Dr. At rehab 1 to 2 days  If you experience worsening of your admission symptoms, develop shortness of breath, life threatening emergency, suicidal or homicidal thoughts you must seek medical attention immediately by calling 911 or calling your MD immediately  if symptoms less severe.  You Must read complete instructions/literature along with all the possible adverse reactions/side effects for all the Medicines you take and that have been prescribed to you. Take  any new Medicines after you have completely understood and accept all the possible adverse reactions/side effects.   Please note  You were cared for by a hospitalist during your hospital stay. If you have any questions about your discharge medications or the care you received while you were in the hospital after you are discharged, you can call the unit and asked to speak with the hospitalist on call if the hospitalist that took care of you is not available. Once you are discharged, your primary care physician will handle any further medical issues. Please note that NO REFILLS for any discharge medications will be authorized once you are discharged, as it is imperative that you return to your primary care physician (or establish a relationship with a primary care physician if you do not have one) for your aftercare needs so that they can reassess your need for medications and monitor your lab values.    Today   CHIEF COMPLAINT:   Chief Complaint  Patient  presents with   Abdominal Pain    HISTORY OF PRESENT ILLNESS:  Dahna Hattabaugh  is a 87 y.o. female with a known history of presented with abdominal pain and found to have colonic ischemia   VITAL SIGNS:  Blood pressure (!) 173/75, pulse 84, temperature 98.5 F (36.9 C), temperature source Oral, resp. rate 17, height 5\' 7"  (1.702 m), weight 72.6 kg, SpO2 97 %.  I/O:   Intake/Output Summary (Last 24 hours) at 02/23/2021 0753 Last data filed at 02/23/2021 0526 Gross per 24 hour  Intake 670.87 ml  Output 100 ml  Net 570.87 ml    PHYSICAL EXAMINATION:  GENERAL:  87 y.o.-year-old patient lying in the bed with no acute distress.  EYES: Pupils equal, round, reactive to light and accommodation. No scleral icterus.  HEENT: Head atraumatic, normocephalic. Oropharynx and nasopharynx clear.   LUNGS: Normal breath sounds bilaterally, no wheezing, rales,rhonchi or crepitation. No use of accessory muscles of respiration.  CARDIOVASCULAR: S1, S2 normal. No murmurs, rubs, or gallops.  ABDOMEN: Soft, non-tender, non-distended.  EXTREMITIES: No pedal edema, NEUROLOGIC: Cranial nerves II through XII are intact. Muscle strength 5/5 in all extremities. Sensation intact. Gait not checked.  PSYCHIATRIC: The patient is alert and oriented x 3.  SKIN: No obvious rash, lesion, or ulcer.   DATA REVIEW:   CBC Recent Labs  Lab 02/22/21 0409  WBC 7.2  HGB 10.3*  HCT 30.3*  PLT 194    Chemistries  Recent Labs  Lab 02/18/21 0719 02/19/21 0631 02/22/21 0409  NA 138   < > 140  K 4.8   < > 3.8  CL 108   < > 113*  CO2 21*   < > 23  GLUCOSE 152*   < > 92  BUN 54*   < > 15  CREATININE 2.14*   < > 1.02*  CALCIUM 8.5*   < > 8.3*  AST 30  --   --   ALT 19  --   --   ALKPHOS 64  --   --   BILITOT 1.0  --   --    < > = values in this interval not displayed.    Microbiology Results  Results for orders placed or performed during the hospital encounter of 02/17/21  Resp Panel by RT-PCR (Flu A&B,  Covid) Nasopharyngeal Swab     Status: None   Collection Time: 02/18/21 12:55 AM   Specimen: Nasopharyngeal Swab; Nasopharyngeal(NP) swabs in vial transport medium  Result Value Ref  Range Status   SARS Coronavirus 2 by RT PCR NEGATIVE NEGATIVE Final    Comment: (NOTE) SARS-CoV-2 target nucleic acids are NOT DETECTED.  The SARS-CoV-2 RNA is generally detectable in upper respiratory specimens during the acute phase of infection. The lowest concentration of SARS-CoV-2 viral copies this assay can detect is 138 copies/mL. A negative result does not preclude SARS-Cov-2 infection and should not be used as the sole basis for treatment or other patient management decisions. A negative result may occur with  improper specimen collection/handling, submission of specimen other than nasopharyngeal swab, presence of viral mutation(s) within the areas targeted by this assay, and inadequate number of viral copies(<138 copies/mL). A negative result must be combined with clinical observations, patient history, and epidemiological information. The expected result is Negative.  Fact Sheet for Patients:  EntrepreneurPulse.com.au  Fact Sheet for Healthcare Providers:  IncredibleEmployment.be  This test is no t yet approved or cleared by the [place] FDA and  has been authorized for detection and/or diagnosis of SARS-CoV-2 by FDA under an Emergency Use Authorization (EUA). This EUA will remain  in effect (meaning this test can be used) for the duration of the COVID-19 declaration under Section 564(b)(1) of the Act, 21 U.S.C.section 360bbb-3(b)(1), unless the authorization is terminated  or revoked sooner.       Influenza A by PCR NEGATIVE NEGATIVE Final   Influenza B by PCR NEGATIVE NEGATIVE Final    Comment: (NOTE) The Xpert Xpress SARS-CoV-2/FLU/RSV plus assay is intended as an aid in the diagnosis of influenza from Nasopharyngeal swab specimens and should  not be used as a sole basis for treatment. Nasal washings and aspirates are unacceptable for Xpert Xpress SARS-CoV-2/FLU/RSV testing.  Fact Sheet for Patients: EntrepreneurPulse.com.au  Fact Sheet for Healthcare Providers: IncredibleEmployment.be  This test is not yet approved or cleared by the [place] FDA and has been authorized for detection and/or diagnosis of SARS-CoV-2 by FDA under an Emergency Use Authorization (EUA). This EUA will remain in effect (meaning this test can be used) for the duration of the COVID-19 declaration under Section 564(b)(1) of the Act, 21 U.S.C. section 360bbb-3(b)(1), unless the authorization is terminated or revoked.  Performed at [facility], [address] [place], [place] [address]       Management plans discussed with the patient, family (son on the phone) and they are in agreement.  CODE STATUS:     Code Status Orders  (From admission, onward)           Start     Ordered   02/18/21 0401  Do not attempt resuscitation (DNR)  Continuous       Question Answer Comment  In the event of cardiac or respiratory ARREST Do not call a "code blue"   In the event of cardiac or respiratory ARREST Do not perform Intubation, CPR, defibrillation or ACLS   In the event of cardiac or respiratory ARREST Use medication by any route, position, wound care, and other measures to relive pain and suffering. May use oxygen, suction and manual treatment of airway obstruction as needed for comfort.      02/18/21 0403           Code Status History     Date Active Date Inactive Code Status Order ID Comments User Context   02/18/2021 0141 02/18/2021 0403 DNR [id number]  Ward, Delice Bison, DO ED   06/23/2018 1726 06/24/2018 1512 DNR [id number]  Hillary Bow, MD ED      Advance Directive  Documentation    Flowsheet Row Most Recent Value  Type of Advance Directive Healthcare Power of Attorney  Pre-existing out  of facility DNR order (yellow form or pink MOST form) --  "MOST" Form in Place? --       TOTAL TIME TAKING CARE OF THIS PATIENT: 35 minutes.    Loletha Grayer M.D on 02/23/2021 at 7:53 AM   [facility]  CC: Primary care physician; [name], MD

## 2021-02-23 NOTE — Progress Notes (Signed)
Report called to Shirlean Mylar at [facility]. Discharge instructions explained to pt/verbalized understanding. IV and tele removed. Will transport off unit via wheelchair when son arrives who is transporting her to rehab.

## 2021-02-23 NOTE — Progress Notes (Signed)
Diltiazem 10mg  IV held due to pt converting back to NSR. Will notify oncoming RN. VSS at this time and MEWS score is 0 at 2hr recheck.  Earleen Reaper, RN

## 2021-02-23 NOTE — Progress Notes (Signed)
[place] [address]   Manufacturing engineer [facility]) RN Hospital Liaison Note:  Per Pricilla Riffle, LCSW, request for [facility] Palliative services at [place] after discharge.   [facility] hospital liaison will follow patient through discharge. Plan is for patient to discharge to [facility] on 7.11.22.   Please call with any hospice or outpatient palliative care related questions.   Thank you for the opportunity to participate in this patient's care.   Bobbie "Loren Racer, RN, BSN [facility] Liaison [phone]

## 2021-02-23 NOTE — Progress Notes (Signed)
   02/23/21 0249  Assess: MEWS Score  Temp 97.7 F (36.5 C)  BP (!) 123/91  Pulse Rate (!) 128  Resp 16  SpO2 97 %  O2 Device Room Air  Assess: MEWS Score  MEWS Temp 0  MEWS Systolic 0  MEWS Pulse 2  MEWS RR 0  MEWS LOC 0  MEWS Score 2  MEWS Score Color Yellow  Assess: if the MEWS score is Yellow or Red  Were vital signs taken at a resting state? Yes  Focused Assessment Change from prior assessment (see assessment flowsheet)  Does the patient meet 2 or more of the SIRS criteria? No  MEWS guidelines implemented *See Row Information* Yes  Treat  MEWS Interventions Escalated (See documentation below)  Pain Scale 0-10  Pain Score 0  Take Vital Signs  Increase Vital Sign Frequency  Yellow: Q 2hr X 2 then Q 4hr X 2, if remains yellow, continue Q 4hrs  Escalate  MEWS: Escalate Yellow: discuss with charge nurse/RN and consider discussing with provider and RRT  Notify: Charge Nurse/RN  Name of Charge Nurse/RN Notified Jake Samples RN  Date Charge Nurse/RN Notified 02/23/21  Time Charge Nurse/RN Notified 0253  Notify: Provider  Provider Name/Title Judd Gaudier, MD  Date Provider Notified 02/23/21  Time Provider Notified 0246  Notification Type Page  Notification Reason Change in status  Provider response Other (Comment) (Awaiting orders)  Date of Provider Response 02/23/21  Time of Provider Response 0258  Assess: SIRS CRITERIA  SIRS Temperature  0  SIRS Pulse 1  SIRS Respirations  0  SIRS WBC 0  SIRS Score Sum  1   Order placed by MD for diltiazem 10mg  IV x1. Will reassess in 2 hrs per protocol. Earleen Reaper, RN

## 2021-02-23 NOTE — TOC Transition Note (Addendum)
Transition of Care [facility]) - CM/SW Discharge Note   Patient Details  Name: Jamie Hodges MRN: [id number] Date of Birth: [birth year]  [facility] CM/SW Contact:  Alberteen Sam, LCSW Phone Number: 02/23/2021, 9:23 AM   Clinical Narrative:     Patient will DC to: [facility] Anticipated DC date: 02/23/21 Transport by:son to transport  Per MD patient ready for DC to [facility] . RN, patient, patient's family, and facility notified of DC. Discharge Summary sent to facility. RN given number for report  [phone] Room 112. DC packet on chart. Son to transport.  CSW signing off.  Pricilla Riffle, LCSW    Final next level of care: Skilled Nursing Facility Barriers to Discharge: No Barriers Identified   Patient Goals and CMS Choice Patient states their goals for this hospitalization and ongoing recovery are:: to go to SNF CMS Medicare.gov Compare Post Acute Care list provided to:: Patient Choice offered to / list presented to : Patient  Discharge Placement              Patient [facility] bed at: [facility] Patient to be transferred to facility by: [facility] Name of family member notified: family to notify Patient and family notified of of transfer: 02/23/21  Discharge Plan and Services     Post Acute Care Choice:  (Hospice. Venue TBD.)                               Social Determinants of Health (SDOH) Interventions     Readmission Risk Interventions No flowsheet data found.

## 2021-02-26 ENCOUNTER — Observation Stay: Payer: Medicare Other

## 2021-02-26 ENCOUNTER — Encounter: Payer: Self-pay | Admitting: Physician Assistant

## 2021-02-26 ENCOUNTER — Telehealth: Payer: Self-pay | Admitting: *Deleted

## 2021-02-26 ENCOUNTER — Other Ambulatory Visit: Payer: Self-pay

## 2021-02-26 ENCOUNTER — Inpatient Hospital Stay
Admission: EM | Admit: 2021-02-26 | Discharge: 2021-02-28 | DRG: 309 | Disposition: A | Discharge status: Home Health | Payer: Medicare Other | Attending: Internal Medicine | Admitting: Internal Medicine

## 2021-02-26 DIAGNOSIS — I4891 Unspecified atrial fibrillation: Secondary | ICD-10-CM | POA: Diagnosis present

## 2021-02-26 DIAGNOSIS — R7989 Other specified abnormal findings of blood chemistry: Secondary | ICD-10-CM | POA: Diagnosis not present

## 2021-02-26 DIAGNOSIS — N1832 Chronic kidney disease, stage 3b: Secondary | ICD-10-CM

## 2021-02-26 DIAGNOSIS — Z20822 Contact with and (suspected) exposure to covid-19: Secondary | ICD-10-CM | POA: Diagnosis not present

## 2021-02-26 DIAGNOSIS — Z79899 Other long term (current) drug therapy: Secondary | ICD-10-CM

## 2021-02-26 DIAGNOSIS — K529 Noninfective gastroenteritis and colitis, unspecified: Secondary | ICD-10-CM | POA: Diagnosis present

## 2021-02-26 DIAGNOSIS — K559 Vascular disorder of intestine, unspecified: Secondary | ICD-10-CM | POA: Diagnosis not present

## 2021-02-26 DIAGNOSIS — I2510 Atherosclerotic heart disease of native coronary artery without angina pectoris: Secondary | ICD-10-CM | POA: Diagnosis present

## 2021-02-26 DIAGNOSIS — N184 Chronic kidney disease, stage 4 (severe): Secondary | ICD-10-CM

## 2021-02-26 DIAGNOSIS — I129 Hypertensive chronic kidney disease with stage 1 through stage 4 chronic kidney disease, or unspecified chronic kidney disease: Secondary | ICD-10-CM | POA: Diagnosis present

## 2021-02-26 DIAGNOSIS — Z880 Allergy status to penicillin: Secondary | ICD-10-CM

## 2021-02-26 DIAGNOSIS — I248 Other forms of acute ischemic heart disease: Secondary | ICD-10-CM | POA: Diagnosis present

## 2021-02-26 DIAGNOSIS — Z825 Family history of asthma and other chronic lower respiratory diseases: Secondary | ICD-10-CM

## 2021-02-26 DIAGNOSIS — D631 Anemia in chronic kidney disease: Secondary | ICD-10-CM | POA: Diagnosis not present

## 2021-02-26 DIAGNOSIS — Z91011 Allergy to milk products: Secondary | ICD-10-CM

## 2021-02-26 DIAGNOSIS — I480 Paroxysmal atrial fibrillation: Secondary | ICD-10-CM | POA: Diagnosis present

## 2021-02-26 DIAGNOSIS — Z9049 Acquired absence of other specified parts of digestive tract: Secondary | ICD-10-CM

## 2021-02-26 DIAGNOSIS — R000 Tachycardia, unspecified: Secondary | ICD-10-CM | POA: Diagnosis not present

## 2021-02-26 DIAGNOSIS — I471 Supraventricular tachycardia: Secondary | ICD-10-CM | POA: Diagnosis not present

## 2021-02-26 DIAGNOSIS — N1831 Chronic kidney disease, stage 3a: Secondary | ICD-10-CM | POA: Diagnosis present

## 2021-02-26 DIAGNOSIS — R0602 Shortness of breath: Secondary | ICD-10-CM

## 2021-02-26 DIAGNOSIS — Z87891 Personal history of nicotine dependence: Secondary | ICD-10-CM

## 2021-02-26 DIAGNOSIS — Z7901 Long term (current) use of anticoagulants: Secondary | ICD-10-CM

## 2021-02-26 DIAGNOSIS — I10 Essential (primary) hypertension: Secondary | ICD-10-CM | POA: Diagnosis not present

## 2021-02-26 DIAGNOSIS — R109 Unspecified abdominal pain: Secondary | ICD-10-CM | POA: Diagnosis not present

## 2021-02-26 DIAGNOSIS — I679 Cerebrovascular disease, unspecified: Secondary | ICD-10-CM | POA: Diagnosis present

## 2021-02-26 DIAGNOSIS — Z888 Allergy status to other drugs, medicaments and biological substances status: Secondary | ICD-10-CM

## 2021-02-26 DIAGNOSIS — N183 Chronic kidney disease, stage 3 unspecified: Secondary | ICD-10-CM

## 2021-02-26 DIAGNOSIS — R778 Other specified abnormalities of plasma proteins: Secondary | ICD-10-CM | POA: Diagnosis not present

## 2021-02-26 DIAGNOSIS — E876 Hypokalemia: Secondary | ICD-10-CM | POA: Diagnosis present

## 2021-02-26 DIAGNOSIS — R079 Chest pain, unspecified: Secondary | ICD-10-CM | POA: Diagnosis not present

## 2021-02-26 DIAGNOSIS — E785 Hyperlipidemia, unspecified: Secondary | ICD-10-CM | POA: Diagnosis present

## 2021-02-26 DIAGNOSIS — I700 Atherosclerosis of aorta: Secondary | ICD-10-CM | POA: Diagnosis present

## 2021-02-26 DIAGNOSIS — Z66 Do not resuscitate: Secondary | ICD-10-CM | POA: Diagnosis present

## 2021-02-26 LAB — RESP PANEL BY RT-PCR (FLU A&B, COVID) ARPGX2
Influenza A by PCR: NEGATIVE
Influenza B by PCR: NEGATIVE
SARS Coronavirus 2 by RT PCR: NEGATIVE

## 2021-02-26 LAB — CBC WITH DIFFERENTIAL/PLATELET
Abs Immature Granulocytes: 0.08 10*3/uL — ABNORMAL HIGH (ref 0.00–0.07)
Basophils Absolute: 0 10*3/uL (ref 0.0–0.1)
Basophils Relative: 1 %
Eosinophils Absolute: 0.1 10*3/uL (ref 0.0–0.5)
Eosinophils Relative: 1 %
HCT: 31.2 % — ABNORMAL LOW (ref 36.0–46.0)
Hemoglobin: 10.7 g/dL — ABNORMAL LOW (ref 12.0–15.0)
Immature Granulocytes: 1 %
Lymphocytes Relative: 11 %
Lymphs Abs: 0.7 10*3/uL (ref 0.7–4.0)
MCH: 33.8 pg (ref 26.0–34.0)
MCHC: 34.3 g/dL (ref 30.0–36.0)
MCV: 98.4 fL (ref 80.0–100.0)
Monocytes Absolute: 0.5 10*3/uL (ref 0.1–1.0)
Monocytes Relative: 7 %
Neutro Abs: 5.2 10*3/uL (ref 1.7–7.7)
Neutrophils Relative %: 79 %
Platelets: 223 10*3/uL (ref 150–400)
RBC: 3.17 MIL/uL — ABNORMAL LOW (ref 3.87–5.11)
RDW: 13.7 % (ref 11.5–15.5)
WBC: 6.5 10*3/uL (ref 4.0–10.5)
nRBC: 0 % (ref 0.0–0.2)

## 2021-02-26 LAB — COMPREHENSIVE METABOLIC PANEL
ALT: 18 U/L (ref 0–44)
AST: 28 U/L (ref 15–41)
Albumin: 3 g/dL — ABNORMAL LOW (ref 3.5–5.0)
Alkaline Phosphatase: 50 U/L (ref 38–126)
Anion gap: 7 (ref 5–15)
BUN: 11 mg/dL (ref 8–23)
CO2: 24 mmol/L (ref 22–32)
Calcium: 8.2 mg/dL — ABNORMAL LOW (ref 8.9–10.3)
Chloride: 109 mmol/L (ref 98–111)
Creatinine, Ser: 1.19 mg/dL — ABNORMAL HIGH (ref 0.44–1.00)
GFR, Estimated: 43 mL/min — ABNORMAL LOW (ref >60)
Glucose, Bld: 161 mg/dL — ABNORMAL HIGH (ref 70–99)
Potassium: 3.4 mmol/L — ABNORMAL LOW (ref 3.5–5.1)
Sodium: 140 mmol/L (ref 135–145)
Total Bilirubin: 0.7 mg/dL (ref 0.3–1.2)
Total Protein: 5.8 g/dL — ABNORMAL LOW (ref 6.5–8.1)

## 2021-02-26 LAB — MAGNESIUM: Magnesium: 1.9 mg/dL (ref 1.7–2.4)

## 2021-02-26 LAB — TROPONIN I (HIGH SENSITIVITY)
Troponin I (High Sensitivity): 19 ng/L — ABNORMAL HIGH (ref <18)
Troponin I (High Sensitivity): 20 ng/L — ABNORMAL HIGH (ref <18)

## 2021-02-26 MED ORDER — DIGOXIN 0.25 MG/ML IJ SOLN
0.2500 mg | Freq: Every day | INTRAMUSCULAR | Status: DC
  Administered 2021-02-26: 0.25 mg via INTRAVENOUS
  Filled 2021-02-26: qty 2

## 2021-02-26 MED ORDER — DILTIAZEM HCL ER COATED BEADS 180 MG PO CP24
360.0000 mg | ORAL_CAPSULE | Freq: Every day | ORAL | Status: DC
  Administered 2021-02-26 – 2021-02-28 (×3): 360 mg via ORAL
  Filled 2021-02-26 (×3): qty 2

## 2021-02-26 MED ORDER — DILTIAZEM HCL ER BEADS 240 MG PO CP24
360.0000 mg | ORAL_CAPSULE | Freq: Every day | ORAL | Status: DC
  Filled 2021-02-26: qty 1

## 2021-02-26 MED ORDER — ACETAMINOPHEN 325 MG PO TABS: 650.0000 mg | ORAL_TABLET | Freq: Four times a day (QID) | ORAL | Status: DC | PRN

## 2021-02-26 MED ORDER — SODIUM CHLORIDE 0.9 % IV SOLN
2.0000 g | Freq: Once | INTRAVENOUS | Status: AC
  Administered 2021-02-26: 2 g via INTRAVENOUS
  Filled 2021-02-26: qty 20

## 2021-02-26 MED ORDER — LACTATED RINGERS IV SOLN: INTRAVENOUS | Status: AC

## 2021-02-26 MED ORDER — ONDANSETRON HCL 4 MG PO TABS
4.0000 mg | ORAL_TABLET | Freq: Four times a day (QID) | ORAL | Status: DC | PRN
Start: 2021-02-26 — End: 2021-02-28

## 2021-02-26 MED ORDER — IOHEXOL 300 MG/ML  SOLN
75.0000 mL | Freq: Once | INTRAMUSCULAR | Status: AC | PRN
  Administered 2021-02-26: 75 mL via INTRAVENOUS

## 2021-02-26 MED ORDER — APIXABAN 5 MG PO TABS
5.0000 mg | ORAL_TABLET | Freq: Two times a day (BID) | ORAL | Status: DC
  Administered 2021-02-26 – 2021-02-28 (×4): 5 mg via ORAL
  Filled 2021-02-26 (×4): qty 1

## 2021-02-26 MED ORDER — IOHEXOL 9 MG/ML PO SOLN
500.0000 mL | Freq: Once | ORAL | Status: AC | PRN
  Administered 2021-02-26 (×2): 500 mL via ORAL

## 2021-02-26 MED ORDER — ACETAMINOPHEN 650 MG RE SUPP: 650.0000 mg | Freq: Four times a day (QID) | RECTAL | Status: DC | PRN

## 2021-02-26 MED ORDER — POTASSIUM CHLORIDE CRYS ER 20 MEQ PO TBCR
30.0000 meq | EXTENDED_RELEASE_TABLET | Freq: Once | ORAL | Status: AC
  Administered 2021-02-26: 30 meq via ORAL
  Filled 2021-02-26: qty 2

## 2021-02-26 MED ORDER — METOPROLOL SUCCINATE ER 100 MG PO TB24: 200.0000 mg | ORAL_TABLET | Freq: Every day | ORAL | Status: DC

## 2021-02-26 MED ORDER — SODIUM CHLORIDE 0.9 % IV SOLN: 1.0000 g | Freq: Once | INTRAVENOUS | Status: DC

## 2021-02-26 MED ORDER — ONDANSETRON HCL 4 MG/2ML IJ SOLN: 4.0000 mg | Freq: Four times a day (QID) | INTRAMUSCULAR | Status: DC | PRN

## 2021-02-26 MED ORDER — DILTIAZEM HCL-DEXTROSE 125-5 MG/125ML-% IV SOLN (PREMIX)
5.0000 mg/h | INTRAVENOUS | Status: DC
  Administered 2021-02-26: 5 mg/h via INTRAVENOUS
  Administered 2021-02-26: 2.5 mg/h via INTRAVENOUS
  Filled 2021-02-26 (×2): qty 125

## 2021-02-26 NOTE — Telephone Encounter (Signed)
Dr. Georgana Curio called Triage line asking that PCP call him back directly. He has a few questions regarding his mother that he wants to speak directly with Dr. Silvio Pate about.  Please call Dr. Janne Napoleon back at [phone]

## 2021-02-26 NOTE — Telephone Encounter (Signed)
Called him and reviewed the situation. I sent her back to the ER this morning due to a regular tachycardia of 150. (Though she was tolerating pretty well). I favored a flutter with 2:1 block but could be SVT ER evaluation ongoing and expectation of admission

## 2021-02-26 NOTE — Progress Notes (Signed)
Jamie Hodges is a 87 y.o. female  [id number]  Primary Cardiologist: unknown Reason for Consultation: SVT  HPI: Jamie Hodges is a 87 y.o. female with medical history significant for paroxysmal atrial fibrillation, hypertension, history of acute colonic ischemia, CKD 3A, primary hypertension, anemia of chronic disease. In no acute distress.   Review of Systems: Denies chest pain and shortness of breath.   Past Medical History:  Diagnosis Date   Atrial fibrillation ([place]) 2010   One time during hospital while sick with severe diarrhea   C. difficile colitis    3/10  Severe C. dif ---had brief atrial fib then. Cath shows some blockages but no intervention   CAD (coronary artery disease) 2010   minor blockages --no intervention indicated   Hx of colonic polyps    Hyperlipidemia    Hypertension     (Not in a hospital admission)     apixaban  5 mg Oral BID   diltiazem  360 mg Oral Daily   [START ON 02/27/2021] metoprolol  200 mg Oral Daily   potassium chloride  30 mEq Oral Once    Infusions:  cefTRIAXone (ROCEPHIN)  IV     diltiazem (CARDIZEM) infusion 15 mg/hr (02/26/21 1311)   lactated ringers      Allergies  Allergen Reactions   Codeine Sulfate     REACTION: rash/ welps   Other     Wild rice   Crestor [Rosuvastatin Calcium]     myalgia   Penicillins Hives and Rash    Has patient had a PCN reaction causing immediate rash, facial/tongue/throat swelling, SOB or lightheadedness with hypotension: Yes Has patient had a PCN reaction causing severe rash involving mucus membranes or skin necrosis: No Has patient had a PCN reaction that required hospitalization: No Has patient had a PCN reaction occurring within the last 10 years: Yes If all of the above answers are "NO", then may proceed with Cephalosporin use.    Social History   Socioeconomic History   Marital status: Married    Spouse name: Not on file   Number of children: 3   Years of education: Not on file    Highest education level: Not on file  Occupational History   Occupation: retired 1st grade teacher  Tobacco Use   Smoking status: Former    Types: Cigarettes    Quit date: 08/16/1978    Years since quitting: 42.5   Smokeless tobacco: Never  Substance and Sexual Activity   Alcohol use: Never   Drug use: No   Sexual activity: Not on file  Other Topics Concern   Not on file  Social History Narrative   Has living will.    Son Mikki Santee (MD) to make health care decisions.--then son Laurey Arrow   Has DNR order in past and requests again--done   No tube feeds if cognitively unaware         Social Determinants of Health   Financial Resource Strain: Not on file  Food Insecurity: Not on file  Transportation Needs: Not on file  Physical Activity: Not on file  Stress: Not on file  Social Connections: Not on file  Intimate Partner Violence: Not on file    Family History  Problem Relation Age of Onset   COPD Mother    Coronary artery disease Neg Hx    Diabetes Neg Hx    Cancer Neg Hx        breast or colon cancer    PHYSICAL EXAM: Vitals:  02/26/21 1415 02/26/21 1430  BP: 117/76 133/72  Pulse: (!) 146 (!) 146  Resp: (!) 25 (!) 23  Temp:    SpO2: 96% 95%    No intake or output data in the 24 hours ending 02/26/21 1516  General:  Well appearing. No respiratory difficulty HEENT: normal Neck: supple. no JVD. Carotids 2+ bilat; no bruits. No lymphadenopathy or thryomegaly appreciated. Cor: PMI nondisplaced. Regular rate & rhythm. No rubs, gallops or murmurs. Lungs: clear Abdomen: soft, nontender, nondistended. No hepatosplenomegaly. No bruits or masses. Good bowel sounds. Extremities: no cyanosis, clubbing, rash, edema Neuro: alert & oriented x 3, cranial nerves grossly intact. moves all 4 extremities w/o difficulty. Affect pleasant.  ECG: SVT, HR 146  Results for orders placed or performed during the hospital encounter of 02/26/21 (from the past 24 hour(s))  Comprehensive metabolic  panel     Status: Abnormal   Collection Time: 02/26/21 10:56 AM  Result Value Ref Range   Sodium 140 135 - 145 mmol/L   Potassium 3.4 (L) 3.5 - 5.1 mmol/L   Chloride 109 98 - 111 mmol/L   CO2 24 22 - 32 mmol/L   Glucose, Bld 161 (H) 70 - 99 mg/dL   BUN 11 8 - 23 mg/dL   Creatinine, Ser 1.19 (H) 0.44 - 1.00 mg/dL   Calcium 8.2 (L) 8.9 - 10.3 mg/dL   Total Protein 5.8 (L) 6.5 - 8.1 g/dL   Albumin 3.0 (L) 3.5 - 5.0 g/dL   AST 28 15 - 41 U/L   ALT 18 0 - 44 U/L   Alkaline Phosphatase 50 38 - 126 U/L   Total Bilirubin 0.7 0.3 - 1.2 mg/dL   GFR, Estimated 43 (L) >60 mL/min   Anion gap 7 5 - 15  Troponin I (High Sensitivity)     Status: Abnormal   Collection Time: 02/26/21 10:56 AM  Result Value Ref Range   Troponin I (High Sensitivity) 19 (H) <18 ng/L  CBC with Differential     Status: Abnormal   Collection Time: 02/26/21 10:56 AM  Result Value Ref Range   WBC 6.5 4.0 - 10.5 K/uL   RBC 3.17 (L) 3.87 - 5.11 MIL/uL   Hemoglobin 10.7 (L) 12.0 - 15.0 g/dL   HCT 31.2 (L) 36.0 - 46.0 %   MCV 98.4 80.0 - 100.0 fL   MCH 33.8 26.0 - 34.0 pg   MCHC 34.3 30.0 - 36.0 g/dL   RDW 13.7 11.5 - 15.5 %   Platelets 223 150 - 400 K/uL   nRBC 0.0 0.0 - 0.2 %   Neutrophils Relative % 79 %   Neutro Abs 5.2 1.7 - 7.7 K/uL   Lymphocytes Relative 11 %   Lymphs Abs 0.7 0.7 - 4.0 K/uL   Monocytes Relative 7 %   Monocytes Absolute 0.5 0.1 - 1.0 K/uL   Eosinophils Relative 1 %   Eosinophils Absolute 0.1 0.0 - 0.5 K/uL   Basophils Relative 1 %   Basophils Absolute 0.0 0.0 - 0.1 K/uL   Immature Granulocytes 1 %   Abs Immature Granulocytes 0.08 (H) 0.00 - 0.07 K/uL  Resp Panel by RT-PCR (Flu A&B, Covid) Nasopharyngeal Swab     Status: None   Collection Time: 02/26/21 11:41 AM   Specimen: Nasopharyngeal Swab; Nasopharyngeal(NP) swabs in vial transport medium  Result Value Ref Range   SARS Coronavirus 2 by RT PCR NEGATIVE NEGATIVE   Influenza A by PCR NEGATIVE NEGATIVE   Influenza B by PCR NEGATIVE  NEGATIVE  Troponin I (High Sensitivity)     Status: Abnormal   Collection Time: 02/26/21  1:45 PM  Result Value Ref Range   Troponin I (High Sensitivity) 20 (H) <18 ng/L   DG Chest Portable 1 View  Result Date: 02/26/2021 CLINICAL DATA:  Chest pain. EXAM: PORTABLE CHEST 1 VIEW COMPARISON:  02/20/2021 FINDINGS: 1324 hours. Lungs are hyperexpanded. The lungs are clear without focal pneumonia, edema, pneumothorax or pleural effusion. Interstitial markings are diffusely coarsened with chronic features. Cardiopericardial silhouette is at upper limits of normal for size. The visualized bony structures of the thorax show no acute abnormality. Telemetry leads overlie the chest. IMPRESSION: Hyperexpansion without acute cardiopulmonary findings. Electronically Signed   By: Misty Stanley M.D.   On: 02/26/2021 13:51     ASSESSMENT AND PLAN: Patient maxed out on cardizem dose. Add digoxin 0.25 IV once daily. Potassium low, add potassium chloride 40 meq IV over 4 hours.   Engineer, drilling  FNP-C

## 2021-02-26 NOTE — ED Notes (Signed)
Patient to CT at this time

## 2021-02-26 NOTE — ED Notes (Signed)
Pt ambulated to toilet to try to urinate and have BM.

## 2021-02-26 NOTE — ED Notes (Signed)
Patient finished oral contrast at this time. CT aware.

## 2021-02-26 NOTE — H&P (Addendum)
History and Physical   Jamie Hodges IOM:[id number] DOB: 09-08-1927 DOA: 02/26/2021  PCP: [name], MD  Patient coming from: [facility]  I have personally briefly reviewed patient's old medical records in [place].  Chief Concern: Elevated heart rate  HPI: Jamie Hodges is a 87 y.o. female with medical history significant for paroxysmal atrial fibrillation, hypertension, history of acute colonic ischemia, CKD 3A, primary hypertension, anemia of chronic disease, presents emergency department from [facility] for chief concern of elevated heart rate.  At bedside patient is able to tell me her name, her age, knows her daughter-in-law Pamala Hurry is at bedside and knows that she is in the hospital.  She provided the majority of the HPI and participated fully in my evaluation and discussion of her care.  She reports that after dinner on 02/25/2021 in the evening her heart rate has been in the 140s and has remained so throughout the night.  She reports she took her a.m. medications and continue to have elevated heart rate.  Her primary care doctor recommended that she be evaluated in the emergency department.  In the emergency department patient received Cardizem gtt and the initiation of this drip started at 1150 for [facility].  At bedside, patient denies chest pain, shortness of breath, abdominal pain, nausea, vomiting, poor p.o. intake.  She endorses persistent chronic loose stool and this has been ongoing for many years.  She denies vision changes, dysphagia, dysuria, blood in her stool.  She reports its been a long time since she has had a well formed bowel movement.    She also endorses a chronic nonproductive cough.  She denies dizziness, headache, episodes of loss of consciousness.  Social history: She is from [facility]  She is a former tobacco user and quit at least 20 years ago.  She denies EtOH and recreational drug use.  ROS: Constitutional: no weight change, no fever ENT/Mouth: no  sore throat, no rhinorrhea Eyes: no eye pain, no vision changes Cardiovascular: no chest pain, no dyspnea,  no edema, no palpitations Respiratory: no cough, no sputum, no wheezing Gastrointestinal: no nausea, no vomiting, no diarrhea, no constipation Genitourinary: no urinary incontinence, no dysuria, no hematuria Musculoskeletal: no arthralgias, no myalgias Skin: no skin lesions, no pruritus, Neuro: + weakness, no loss of consciousness, no syncope Psych: no anxiety, no depression, + decrease appetite Heme/Lymph: no bruising, no bleeding  ED Course: Discussed with ED provider, patient requiring hospitalization for irregular heartbeat.  Vitals in the emergency department was remarkable for temperature of 98.7, respiration rate of 15, heart rate 147, blood pressure 124/74, SPO2 of 98% on room air.  Labs in the emergency department was remarkable for potassium 3.4, chloride 109, bicarb 24, BUN 11, serum creatinine of 1.19, nonfasting blood glucose 161, WBC 6.5, hemoglobin 10.7, platelets 223, EGFR 43, troponin 19.  COVID/influenza A/influenza B PCR were negative.  Patient was started on Cardizem gtt.  Assessment/Plan  Principal Problem:   SVT (supraventricular tachycardia) (HCC) Active Problems:   Essential hypertension   AF (paroxysmal atrial fibrillation) (HCC)   Chronic diarrhea   Ischemic bowel disease (HCC)   Colonic ischemia (HCC)   Elevated troponin   Hypokalemia   # SVT versus A. fib with RVR versus a flutter-etiology work-up in progress - Patient was given her home dose of metoprolol XL 200 mg in the a.m. prior to presentation to the emergency department - Patient was started on Cardizem gtt. at max dose of 15 - Heart rate remains in  the 140s, goal heart rate is less than 120 - Adenosine was considered and discussed however given the patient's heart rate is in the 140s and she is otherwise hemodynamically stable and awake and alert and oriented x4, I will defer adenosine  at this time - Patient and family would like to also defer adenosine at this time pending cardiology evaluation - Complete echo ordered - No indication for CT of the head and patient has been compliant with Eliquis imaging at this time as patient does not have slurred speech and or asymmetrical weakness and/or facial drooping - Etiology work-up: CT abdomen and pelvis with contrast, UA, portable chest x-ray ordered - Cardiology consulted by myself via secure chat, Dr. Humphrey Rolls is aware and will see the patient - Admit to progressive cardiac, observation, telemetry  # Paroxysmal atrial fibrillation-treat as above, resumed Eliquis - If indicated pending CT imaging of the abdomen, will transition to heparin  # Elevated troponin-I suspect this is secondary to demand ischemia in setting of elevated heart rate - Initial troponin is 19, we will continue to follow - Cardiology consulted, complete echo ordered  # Hypertension - resumed metoprolol succinate 200 mg daily starting 02/27/2021 - Patient got her a.m. dose - Per med reconciliation, patient did not receive her diltiazem 360 mg p.o. daily prior to hospital presentation  # Hypokalemia - etiology may be related to history of acute ischemic bowel and/or chronic diarrhea - Potassium chloride 30 mill equivalent p.o. once - Check magnesium  # Anemia of chronic disease-her hemoglobin range in the last 9 days is from 9.1-12.4 - This appears to be a overall decline - Hemoglobin on admission is within this range at 10.7 - No indication for blood transfusion at this time  # History of acute colonic ischemia -CT abdomen pelvis with contrast as above # CKD 3A - at baseline, serum creatinine on presentation is 1.19 # Patient and family at bedside desires that upon discharge patient be discharged home.  TOC has been consulted for home PT and/or home health aide inquiry - A.m. team to consult PT and OT once patient's heart rate is stabilized  Chart  reviewed.   Hospitalization from 02/17/2021 to 02/23/2021 for acute colonic ischemia. General surgery was consulted and patient refused surgical intervention. Patient was started on Rocephin and Flagyl IV. Repeat CT imaging on 02/21/2020 revealed resolved resolution of pneumatosis seen on previous exam. CT abdomen pelvis did reveal persistent wall thickening of the proximal colon from tip of cecum through mid transverse colon consistent with colitis that could be seen in ischemia, infection, inflammatory bowel disease. Interval resolution of previously identified portal venous gas. Distal colonic diverticulosis without evidence of diverticulitis.  Heparin GTT was discontinued, patient was transition to Eliquis twice daily and discharged home with 1 week of antibiotic with Omnicef and Flagyl p.o. Patient was also discharged home with Cardizem to 50 mg daily, metoprolol XL 200 mg daily.  DVT prophylaxis: Eliquis 5 mg twice daily Code Status: DNR Diet: Heart healthy Family Communication: Pamala Hurry, daughter-in-law at bedside and Mikki Santee, son-in-law over the phone Disposition Plan: Pending clinical course Consults called: Cardiology Admission status: Progressive cardiac, observation, telemetry  Past Medical History:  Diagnosis Date   Atrial fibrillation ([place]) 2010   One time during hospital while sick with severe diarrhea   C. difficile colitis    3/10  Severe C. dif ---had brief atrial fib then. Cath shows some blockages but no intervention   CAD (coronary artery disease) 2010   minor  blockages --no intervention indicated   Hx of colonic polyps    Hyperlipidemia    Hypertension    Past Surgical History:  Procedure Laterality Date   CHOLECYSTECTOMY  3/16   ERCP N/A 01/14/2015   Procedure: ENDOSCOPIC RETROGRADE CHOLANGIOPANCREATOGRAPHY (ERCP);  Surgeon: Lucilla Lame, MD;  [facility] ENDOSCOPY;  Service: Endoscopy;  Laterality: N/A;   TONSILLECTOMY AND ADENOIDECTOMY     Social History:  reports  that she quit smoking about 42 years ago. She has never used smokeless tobacco. She reports that she does not drink alcohol and does not use drugs.  Allergies  Allergen Reactions   Codeine Sulfate     REACTION: rash/ welps   Other     Wild rice   Crestor [Rosuvastatin Calcium]     myalgia   Penicillins Hives and Rash    Has patient had a PCN reaction causing immediate rash, facial/tongue/throat swelling, SOB or lightheadedness with hypotension: Yes Has patient had a PCN reaction causing severe rash involving mucus membranes or skin necrosis: No Has patient had a PCN reaction that required hospitalization: No Has patient had a PCN reaction occurring within the last 10 years: Yes If all of the above answers are "NO", then may proceed with Cephalosporin use.   Family History  Problem Relation Age of Onset   COPD Mother    Coronary artery disease Neg Hx    Diabetes Neg Hx    Cancer Neg Hx        breast or colon cancer   Family history: Family history reviewed and not pertinent  Prior to Admission medications   Medication Sig Start Date End Date Taking? Authorizing Provider  acetaminophen (TYLENOL) 325 MG tablet Take 650 mg by mouth every 6 (six) hours as needed.    [provider]  albuterol (VENTOLIN HFA) 108 (90 Base) MCG/ACT inhaler Inhale 2 puffs into the lungs every 6 (six) hours as needed for wheezing or shortness of breath. 12/06/19   [name], MD  apixaban (ELIQUIS) 5 MG TABS tablet Take 1 tablet (5 mg total) by mouth 2 (two) times daily. 02/23/21   Loletha Grayer, MD  cefdinir (OMNICEF) 300 MG capsule Take 1 capsule (300 mg total) by mouth every 12 (twelve) hours for 5 doses. 02/23/21 02/26/21  Loletha Grayer, MD  diltiazem (CARTIA XT) 240 MG 24 hr capsule Take 1 capsule (240 mg total) by mouth every evening. 02/23/21 02/23/22  Loletha Grayer, MD  EPINEPHrine 0.3 mg/0.3 mL IJ SOAJ injection Inject 0.3 mg into the muscle as needed (for allergic reaction).     [provider]  loperamide (IMODIUM) 2 MG capsule Take 2 mg by mouth 3 (three) times daily as needed for diarrhea or loose stools.    [provider]  meclizine (ANTIVERT) 12.5 MG tablet Take 1 tablet (12.5 mg total) by mouth 3 (three) times daily as needed for dizziness or nausea. 02/23/21   Loletha Grayer, MD  metoprolol (TOPROL-XL) 200 MG 24 hr tablet TAKE ONE TABLET BY MOUTH DAILY 06/25/20   [name], MD  metroNIDAZOLE (FLAGYL) 500 MG tablet Take 1 tablet (500 mg total) by mouth 3 (three) times daily for 8 doses. 02/23/21 02/26/21  Loletha Grayer, MD  promethazine (PHENERGAN) 12.5 MG tablet Take 1 tablet (12.5 mg total) by mouth every 8 (eight) hours as needed for nausea or vomiting. 02/23/21   Loletha Grayer, MD   Physical Exam: Vitals:   02/26/21 1230 02/26/21 1300 02/26/21 1330 02/26/21 1415  BP: 127/76  129/81 (!) 116/99 117/76  Pulse: (!) 144 (!) 145 (!) 144 (!) 146  Resp: (!) 21 17 (!) 23 (!) 25  Temp:      TempSrc:      SpO2: 95% 96% 96% 96%  Weight:      Height:       Constitutional: appears younger than chronological age, NAD, calm, comfortable Eyes: PERRL, lids and conjunctivae normal ENMT: Mucous membranes are moist. Posterior pharynx clear of any exudate or lesions. Age-appropriate dentition.  Mild hearing loss Neck: normal, supple, no masses, no thyromegaly Respiratory: clear to auscultation bilaterally, no wheezing, no crackles. Normal respiratory effort. No accessory muscle use.  Cardiovascular: Elevated heart rate, unable to access rhythm due to rate. No extremity edema. 2+ pedal pulses.  Abdomen: no tenderness, no masses palpated, no hepatosplenomegaly. Bowel sounds positive.  Musculoskeletal: no clubbing / cyanosis. No joint deformity upper and lower extremities. Good ROM, no contractures, no atrophy. Normal muscle tone.  Skin: no rashes, lesions, ulcers. No induration Neurologic: Sensation intact. Strength 5/5 in all 4.  Psychiatric:  Normal judgment and insight. Alert and oriented x 3. Normal mood.   EKG: independently reviewed, showing supraventricular tachycardia with rate of 146, qtc 496  Chest x-ray on Admission: I personally reviewed and I agree with radiologist reading as below.  DG Chest Portable 1 View  Result Date: 02/26/2021 CLINICAL DATA:  Chest pain. EXAM: PORTABLE CHEST 1 VIEW COMPARISON:  02/20/2021 FINDINGS: 1324 hours. Lungs are hyperexpanded. The lungs are clear without focal pneumonia, edema, pneumothorax or pleural effusion. Interstitial markings are diffusely coarsened with chronic features. Cardiopericardial silhouette is at upper limits of normal for size. The visualized bony structures of the thorax show no acute abnormality. Telemetry leads overlie the chest. IMPRESSION: Hyperexpansion without acute cardiopulmonary findings. Electronically Signed   By: Misty Stanley M.D.   On: 02/26/2021 13:51    Labs on Admission: I have personally reviewed following labs  CBC: Recent Labs  Lab 02/20/21 0113 02/21/21 0421 02/22/21 0409 02/26/21 1056  WBC 14.7* 7.1 7.2 6.5  NEUTROABS  --   --   --  5.2  HGB 9.8* 9.1* 10.3* 10.7*  HCT 29.6* 27.2* 30.3* 31.2*  MCV 99.0 97.8 96.2 98.4  PLT 158 141* 194 161   Basic Metabolic Panel: Recent Labs  Lab 02/20/21 0113 02/21/21 0421 02/22/21 0409 02/26/21 1056  NA 139 136 140 140  K 3.8 3.7 3.8 3.4*  CL 112* 112* 113* 109  CO2 20* 19* 23 24  GLUCOSE 121* 90 92 161*  BUN 28* '22 15 11  ' CREATININE 1.40* 1.27* 1.02* 1.19*  CALCIUM 8.3* 7.9* 8.3* 8.2*   GFR: Estimated Creatinine Clearance: 29.9 mL/min (A) (by C-G formula based on SCr of 1.19 mg/dL (H)).  Liver Function Tests: Recent Labs  Lab 02/26/21 1056  AST 28  ALT 18  ALKPHOS 50  BILITOT 0.7  PROT 5.8*  ALBUMIN 3.0*   Urine analysis:    Component Value Date/Time   COLORURINE YELLOW (A) 02/18/2021 0345   APPEARANCEUR HAZY (A) 02/18/2021 0345   APPEARANCEUR Cloudy 04/24/2013 2036   LABSPEC  1.018 02/18/2021 0345   LABSPEC 1.019 04/24/2013 2036   PHURINE 5.0 02/18/2021 0345   GLUCOSEU NEGATIVE 02/18/2021 0345   GLUCOSEU Negative 04/24/2013 2036   HGBUR NEGATIVE 02/18/2021 0345   BILIRUBINUR NEGATIVE 02/18/2021 0345   BILIRUBINUR Negative 03/09/2018 1027   BILIRUBINUR Negative 04/24/2013 2036   KETONESUR NEGATIVE 02/18/2021 0345   PROTEINUR NEGATIVE 02/18/2021 0345   UROBILINOGEN 0.2  03/09/2018 1027   NITRITE NEGATIVE 02/18/2021 0345   LEUKOCYTESUR SMALL (A) 02/18/2021 0345   LEUKOCYTESUR 3+ 04/24/2013 2036   CRITICAL CARE Performed by: Briant Cedar Connelly [place]  Total critical care time: 45 minutes  Critical care time was exclusive of separately billable procedures and treating other patients.  Critical care was necessary to treat or prevent imminent or life-threatening deterioration.  Circulatory failure  Critical care was time spent personally by me on the following activities: development of treatment plan with patient and/or surrogate as well as nursing, discussions with consultants, evaluation of patient's response to treatment, examination of patient, obtaining history from patient or surrogate, ordering and performing treatments and interventions, ordering and review of laboratory studies, ordering and review of radiographic studies, pulse oximetry and re-evaluation of patient's condition.  Dr. Tobie Poet [facility]  If 7PM-7AM, please contact overnight-coverage provider If 7AM-7PM, please contact day coverage provider [email or web address]  02/26/2021, 2:39 PM

## 2021-02-26 NOTE — Plan of Care (Signed)

## 2021-02-26 NOTE — ED Provider Notes (Signed)
[facility] Emergency Department Provider Note  ____________________________________________   Event Date/Time   First MD Initiated Contact with Patient 02/26/21 1118     (approximate)  I have reviewed the triage vital signs and the nursing notes.   HISTORY  Chief Complaint Irregular Heart Beat    HPI Jamie Hodges is a 87 y.o. female presents emergency department via EMS from [facility] rehab center.  Patient has a history of A. fib.  However her heart rate became very rapid last night and continued into this morning.  Patient took her metoprolol and a Cardizem without any relief.  She denies chest pain or shortness of breath.  She states just tired of her heart racing.  Past Medical History:  Diagnosis Date   Atrial fibrillation ([place]) 2010   One time during hospital while sick with severe diarrhea   C. difficile colitis    3/10  Severe C. dif ---had brief atrial fib then. Cath shows some blockages but no intervention   CAD (coronary artery disease) 2010   minor blockages --no intervention indicated   Hx of colonic polyps    Hyperlipidemia    Hypertension     Patient Active Problem List   Diagnosis Date Noted   SVT (supraventricular tachycardia) ([place]) 02/26/2021   Syncope, vasovagal    Anemia of chronic disease    Weakness    Ischemic bowel disease ([place]) 02/18/2021   Colonic ischemia ([place]) 02/18/2021   Diarrhea    Acute kidney injury superimposed on CKD (HCC)    Lactic acidosis    Right hip pain 03/17/2020   Wheezing 12/06/2019   Aortic atherosclerosis ([place]) 07/20/2019   Chronic renal disease, stage III ([place]) 02/23/2019   Arachnoid cyst 06/29/2018   Cerebrovascular disease 06/29/2018   Vertigo 06/23/2018   Preventative health care 02/17/2018   IBS (irritable bowel syndrome) 05/26/2016   Counseling regarding advanced directives 02/27/2014   Benign paroxysmal positional vertigo 05/11/2013   Chronic diarrhea 06/09/2011   Essential  hypertension 09/16/2009   AF (paroxysmal atrial fibrillation) ([place]) 09/16/2009   DIVERTICULOSIS, COLON 09/16/2009    Past Surgical History:  Procedure Laterality Date   CHOLECYSTECTOMY  3/16   ERCP N/A 01/14/2015   Procedure: ENDOSCOPIC RETROGRADE CHOLANGIOPANCREATOGRAPHY (ERCP);  Surgeon: Lucilla Lame, MD;  [facility] ENDOSCOPY;  Service: Endoscopy;  Laterality: N/A;   TONSILLECTOMY AND ADENOIDECTOMY      Prior to Admission medications   Medication Sig Start Date End Date Taking? Authorizing Provider  acetaminophen (TYLENOL) 325 MG tablet Take 650 mg by mouth every 6 (six) hours as needed.   Yes [provider]  albuterol (VENTOLIN HFA) 108 (90 Base) MCG/ACT inhaler Inhale 2 puffs into the lungs every 6 (six) hours as needed for wheezing or shortness of breath. 12/06/19  Yes [name], MD  apixaban (ELIQUIS) 5 MG TABS tablet Take 1 tablet (5 mg total) by mouth 2 (two) times daily. 02/23/21  Yes Wieting, Richard, MD  cefdinir (OMNICEF) 300 MG capsule Take 1 capsule (300 mg total) by mouth every 12 (twelve) hours for 5 doses. 02/23/21 02/26/21 Yes Wieting, Richard, MD  diltiazem (CARDIZEM) 30 MG tablet Take 30 mg by mouth every 6 (six) hours as needed.   Yes [provider]  diltiazem (CARTIA XT) 240 MG 24 hr capsule Take 1 capsule (240 mg total) by mouth every evening. 02/23/21 02/23/22 Yes Wieting, Richard, MD  diltiazem [facility]) 360 MG 24 hr capsule Take 360 mg by mouth daily.   Yes  [provider]  EPINEPHrine 0.3 mg/0.3 mL IJ SOAJ injection Inject 0.3 mg into the muscle as needed (for allergic reaction).   Yes [provider]  loperamide (IMODIUM) 2 MG capsule Take 2 mg by mouth 3 (three) times daily as needed for diarrhea or loose stools.   Yes [provider]  meclizine (ANTIVERT) 12.5 MG tablet Take 1 tablet (12.5 mg total) by mouth 3 (three) times daily as needed for dizziness or nausea. 02/23/21  Yes Wieting, Richard, MD  metoprolol  (TOPROL-XL) 200 MG 24 hr tablet TAKE ONE TABLET BY MOUTH DAILY 06/25/20  Yes [name], MD  metroNIDAZOLE (FLAGYL) 500 MG tablet Take 1 tablet (500 mg total) by mouth 3 (three) times daily for 8 doses. 02/23/21 02/26/21 Yes Wieting, Richard, MD  promethazine (PHENERGAN) 12.5 MG tablet Take 1 tablet (12.5 mg total) by mouth every 8 (eight) hours as needed for nausea or vomiting. 02/23/21  Yes Wieting, Richard, MD    Allergies Codeine sulfate, Other, Crestor [rosuvastatin calcium], and Penicillins  Family History  Problem Relation Age of Onset   COPD Mother    Coronary artery disease Neg Hx    Diabetes Neg Hx    Cancer Neg Hx        breast or colon cancer    Social History Social History   Tobacco Use   Smoking status: Former    Types: Cigarettes    Quit date: 08/16/1978    Years since quitting: 42.5   Smokeless tobacco: Never  Substance Use Topics   Alcohol use: Never   Drug use: No    Review of Systems  Constitutional: No fever/chills Eyes: No visual changes. ENT: No sore throat. Respiratory: Denies cough Cardiovascular: Denies chest pain, positive for rapid heart rate Gastrointestinal: Denies abdominal pain Genitourinary: Negative for dysuria. Musculoskeletal: Negative for back pain. Skin: Negative for rash. Psychiatric: no mood changes,     ____________________________________________   PHYSICAL EXAM:  VITAL SIGNS: ED Triage Vitals  Enc Vitals Group     BP 02/26/21 1058 132/86     Pulse Rate 02/26/21 1058 (!) 148     Resp 02/26/21 1058 16     Temp 02/26/21 1058 98.7 F (37.1 C)     Temp Source 02/26/21 1058 Oral     SpO2 02/26/21 1058 100 %     Weight 02/26/21 1100 150 lb (68 kg)     Height 02/26/21 1100 5\' 7"  (1.702 m)     Head Circumference --      Peak Flow --      Pain Score 02/26/21 1058 0     Pain Loc --      Pain Edu? --      Excl. in [place]? --     Constitutional: Alert and oriented. Well appearing and in no acute distress. Eyes:  Conjunctivae are normal.  Head: Atraumatic. Nose: No congestion/rhinnorhea. Mouth/Throat: Mucous membranes are moist.   Neck:  supple no lymphadenopathy noted Cardiovascular: Rapid rate with a regular rhythm. Heart sounds are normal Respiratory: Normal respiratory effort.  No retractions, lungs c t a  Abd: soft nontender bs normal all 4 quad GU: deferred Musculoskeletal: FROM all extremities, warm and well perfused Neurologic:  Normal speech and language.  Skin:  Skin is warm, dry and intact. No rash noted. Psychiatric: Mood and affect are normal. Speech and behavior are normal.  ____________________________________________   LABS (all labs ordered are listed, but only abnormal results are displayed)  Labs Reviewed  COMPREHENSIVE  METABOLIC PANEL - Abnormal; Notable for the following components:      Result Value   Potassium 3.4 (*)    Glucose, Bld 161 (*)    Creatinine, Ser 1.19 (*)    Calcium 8.2 (*)    Total Protein 5.8 (*)    Albumin 3.0 (*)    GFR, Estimated 43 (*)    All other components within normal limits  CBC WITH DIFFERENTIAL/PLATELET - Abnormal; Notable for the following components:   RBC 3.17 (*)    Hemoglobin 10.7 (*)    HCT 31.2 (*)    Abs Immature Granulocytes 0.08 (*)    All other components within normal limits  TROPONIN I (HIGH SENSITIVITY) - Abnormal; Notable for the following components:   Troponin I (High Sensitivity) 19 (*)    All other components within normal limits  RESP PANEL BY RT-PCR (FLU A&B, COVID) ARPGX2  URINALYSIS, COMPLETE (UACMP) WITH MICROSCOPIC  TROPONIN I (HIGH SENSITIVITY)   ____________________________________________   ____________________________________________  RADIOLOGY  Chest x-ray  ____________________________________________   PROCEDURES  Procedure(s) performed: Cardizem infusion  .Critical Care E&M  Date/Time: 02/26/2021 1:19 PM Performed by: Versie Starks, PA-C  Critical care provider statement:     Critical care time (minutes):  45   Critical care time was exclusive of:  Separately billable procedures and treating other patients   Critical care was necessary to treat or prevent imminent or life-threatening deterioration of the following conditions:  Circulatory failure and cardiac failure   Critical care was time spent personally by me on the following activities:  Blood draw for specimens, development of treatment plan with patient or surrogate, evaluation of patient's response to treatment, examination of patient, obtaining history from patient or surrogate, ordering and performing treatments and interventions, ordering and review of laboratory studies, ordering and review of radiographic studies, pulse oximetry, re-evaluation of patient's condition and review of old charts After initial E/M assessment, critical care services were subsequently performed that were exclusive of separately billable procedures or treatment.      ____________________________________________   INITIAL IMPRESSION / [place] / ED COURSE  Pertinent labs & imaging results that were available during my care of the patient were reviewed by me and considered in my medical decision making (see chart for details).   Patient is a 87 year old female presents with rapid A. fib.  See HPI.  Physical exam shows patient stable this time.  DDx: A. fib RVR, dehydration, sinus tachycardia, MI, PE, covid  Respiratory panel is negative, negative for COVID and influenza.  CBC has decreased H&H, is normal at patient's trend Metabolic panel shows decreased potassium 3.4, glucose elevated 161, BUN is normal, creatinine is elevated  Troponin is elevated, patient's troponin 9 days ago was 6 and today is 19  Chest x-ray reviewed by me confirmed by radiology to be negative  Consult to hospitalist for admission.  Patient has been maxed out on Cardizem, patient still has a rapid rate of 144   Dr cox to admit  Naiyah Klostermann was evaluated in Emergency Department on 02/26/2021 for the symptoms described in the history of present illness. She was evaluated in the context of the global COVID-19 pandemic, which necessitated consideration that the patient might be at risk for infection with the SARS-CoV-2 virus that causes COVID-19. Institutional protocols and algorithms that pertain to the evaluation of patients at risk for COVID-19 are in a state of rapid change based on information released by regulatory bodies including the CDC and  federal and state organizations. These policies and algorithms were followed during the patient's care in the ED.    As part of my medical decision making, I reviewed the following data within the [place] History obtained from family, Nursing notes reviewed and incorporated, Labs reviewed , EKG interpreted atrial fibrillation, Old chart reviewed, Radiograph reviewed , Discussed with admitting physician , Notes from prior ED visits, and [place] Controlled Substance Database  ____________________________________________   FINAL CLINICAL IMPRESSION(S) / ED DIAGNOSES  Final diagnoses:  Elevated troponin  Atrial fibrillation with rapid ventricular response (Center)      NEW MEDICATIONS STARTED DURING THIS VISIT:  New Prescriptions   No medications on file     Note:  This document was prepared using Dragon voice recognition software and may include unintentional dictation errors.    Versie Starks, PA-C 02/26/21 1401    Naaman Plummer, MD 02/28/21 [phone]

## 2021-02-26 NOTE — ED Triage Notes (Signed)
BIB EMS from [facility]. admitted there within last 7 days for AFIB. Pt denies any complaint. Was given metoprolol at 0500am with no changes.   20G RAC. 148 HR EKG 191 BGL 136/72 96%on RA

## 2021-02-26 NOTE — ED Notes (Signed)
Assisted pt to toilet for BM. Then placed back in bed, clean depends and purewick back in place.

## 2021-02-27 ENCOUNTER — Observation Stay
Admit: 2021-02-27 | Discharge: 2021-02-27 | Disposition: A | Discharge status: Home / Self Care | Payer: Medicare Other | Attending: Internal Medicine | Admitting: Internal Medicine

## 2021-02-27 DIAGNOSIS — E785 Hyperlipidemia, unspecified: Secondary | ICD-10-CM | POA: Diagnosis present

## 2021-02-27 DIAGNOSIS — Z66 Do not resuscitate: Secondary | ICD-10-CM | POA: Diagnosis present

## 2021-02-27 DIAGNOSIS — R7989 Other specified abnormal findings of blood chemistry: Secondary | ICD-10-CM | POA: Diagnosis not present

## 2021-02-27 DIAGNOSIS — I248 Other forms of acute ischemic heart disease: Secondary | ICD-10-CM | POA: Diagnosis present

## 2021-02-27 DIAGNOSIS — D631 Anemia in chronic kidney disease: Secondary | ICD-10-CM | POA: Diagnosis present

## 2021-02-27 DIAGNOSIS — Z91011 Allergy to milk products: Secondary | ICD-10-CM | POA: Diagnosis not present

## 2021-02-27 DIAGNOSIS — I129 Hypertensive chronic kidney disease with stage 1 through stage 4 chronic kidney disease, or unspecified chronic kidney disease: Secondary | ICD-10-CM | POA: Diagnosis present

## 2021-02-27 DIAGNOSIS — I700 Atherosclerosis of aorta: Secondary | ICD-10-CM | POA: Diagnosis present

## 2021-02-27 DIAGNOSIS — Z87891 Personal history of nicotine dependence: Secondary | ICD-10-CM | POA: Diagnosis not present

## 2021-02-27 DIAGNOSIS — N1831 Chronic kidney disease, stage 3a: Secondary | ICD-10-CM | POA: Diagnosis present

## 2021-02-27 DIAGNOSIS — Z9049 Acquired absence of other specified parts of digestive tract: Secondary | ICD-10-CM | POA: Diagnosis not present

## 2021-02-27 DIAGNOSIS — Z20822 Contact with and (suspected) exposure to covid-19: Secondary | ICD-10-CM | POA: Diagnosis present

## 2021-02-27 DIAGNOSIS — I480 Paroxysmal atrial fibrillation: Secondary | ICD-10-CM | POA: Diagnosis present

## 2021-02-27 DIAGNOSIS — Z79899 Other long term (current) drug therapy: Secondary | ICD-10-CM | POA: Diagnosis not present

## 2021-02-27 DIAGNOSIS — E876 Hypokalemia: Secondary | ICD-10-CM | POA: Diagnosis present

## 2021-02-27 DIAGNOSIS — Z888 Allergy status to other drugs, medicaments and biological substances status: Secondary | ICD-10-CM | POA: Diagnosis not present

## 2021-02-27 DIAGNOSIS — I471 Supraventricular tachycardia: Secondary | ICD-10-CM | POA: Diagnosis present

## 2021-02-27 DIAGNOSIS — Z825 Family history of asthma and other chronic lower respiratory diseases: Secondary | ICD-10-CM | POA: Diagnosis not present

## 2021-02-27 DIAGNOSIS — K559 Vascular disorder of intestine, unspecified: Secondary | ICD-10-CM | POA: Diagnosis present

## 2021-02-27 DIAGNOSIS — I10 Essential (primary) hypertension: Secondary | ICD-10-CM | POA: Diagnosis not present

## 2021-02-27 DIAGNOSIS — Z880 Allergy status to penicillin: Secondary | ICD-10-CM | POA: Diagnosis not present

## 2021-02-27 DIAGNOSIS — Z7901 Long term (current) use of anticoagulants: Secondary | ICD-10-CM | POA: Diagnosis not present

## 2021-02-27 DIAGNOSIS — I2510 Atherosclerotic heart disease of native coronary artery without angina pectoris: Secondary | ICD-10-CM | POA: Diagnosis present

## 2021-02-27 LAB — GASTROINTESTINAL PANEL BY PCR, STOOL (REPLACES STOOL CULTURE)

## 2021-02-27 LAB — CBC
HCT: 31.9 % — ABNORMAL LOW (ref 36.0–46.0)
Hemoglobin: 10.7 g/dL — ABNORMAL LOW (ref 12.0–15.0)
MCH: 32.4 pg (ref 26.0–34.0)
MCHC: 33.5 g/dL (ref 30.0–36.0)
MCV: 96.7 fL (ref 80.0–100.0)
Platelets: 198 10*3/uL (ref 150–400)
RBC: 3.3 MIL/uL — ABNORMAL LOW (ref 3.87–5.11)
RDW: 13.6 % (ref 11.5–15.5)
WBC: 4.7 10*3/uL (ref 4.0–10.5)
nRBC: 0 % (ref 0.0–0.2)

## 2021-02-27 LAB — BASIC METABOLIC PANEL
Anion gap: 7 (ref 5–15)
BUN: 8 mg/dL (ref 8–23)
CO2: 23 mmol/L (ref 22–32)
Calcium: 8.2 mg/dL — ABNORMAL LOW (ref 8.9–10.3)
Chloride: 109 mmol/L (ref 98–111)
Creatinine, Ser: 1.01 mg/dL — ABNORMAL HIGH (ref 0.44–1.00)
GFR, Estimated: 53 mL/min — ABNORMAL LOW (ref >60)
Glucose, Bld: 144 mg/dL — ABNORMAL HIGH (ref 70–99)
Potassium: 3.6 mmol/L (ref 3.5–5.1)
Sodium: 139 mmol/L (ref 135–145)

## 2021-02-27 LAB — ECHOCARDIOGRAM COMPLETE
AR max vel: 2.98 cm2
AV Area VTI: 3.3 cm2
AV Area mean vel: 3.03 cm2
AV Mean grad: 5.5 mmHg
AV Peak grad: 8.2 mmHg
Ao pk vel: 1.44 m/s
Area-P 1/2: 5.06 cm2
Height: 67 in
MV VTI: 2.39 cm2
S' Lateral: 1.95 cm
Weight: 2494.4 [oz_av]

## 2021-02-27 LAB — TSH: TSH: 2.283 u[IU]/mL (ref 0.350–4.500)

## 2021-02-27 MED ORDER — AMIODARONE HCL 200 MG PO TABS
400.0000 mg | ORAL_TABLET | Freq: Two times a day (BID) | ORAL | Status: DC
  Administered 2021-02-27 – 2021-02-28 (×3): 400 mg via ORAL
  Filled 2021-02-27 (×3): qty 2

## 2021-02-27 MED ORDER — POTASSIUM CHLORIDE CRYS ER 20 MEQ PO TBCR
40.0000 meq | EXTENDED_RELEASE_TABLET | Freq: Once | ORAL | Status: AC
  Administered 2021-02-27: 40 meq via ORAL
  Filled 2021-02-27: qty 2

## 2021-02-27 MED ORDER — METOPROLOL TARTRATE 50 MG PO TABS
50.0000 mg | ORAL_TABLET | Freq: Two times a day (BID) | ORAL | Status: DC
  Administered 2021-02-27 – 2021-02-28 (×3): 50 mg via ORAL
  Filled 2021-02-27 (×4): qty 1

## 2021-02-27 MED ORDER — MAGNESIUM SULFATE 2 GM/50ML IV SOLN
2.0000 g | Freq: Once | INTRAVENOUS | Status: AC
  Administered 2021-02-27: 2 g via INTRAVENOUS
  Filled 2021-02-27: qty 50

## 2021-02-27 MED ORDER — CHOLESTYRAMINE 4 G PO PACK
4.0000 g | PACK | Freq: Two times a day (BID) | ORAL | Status: DC
  Administered 2021-02-27: 4 g via ORAL
  Filled 2021-02-27 (×4): qty 1

## 2021-02-27 MED ORDER — MECLIZINE HCL 25 MG PO TABS
25.0000 mg | ORAL_TABLET | Freq: Two times a day (BID) | ORAL | Status: DC | PRN
  Filled 2021-02-27: qty 1

## 2021-02-27 NOTE — Progress Notes (Signed)
PROGRESS NOTE    Jamie Hodges  NTI:[id number] DOB: 1927/09/04 DOA: 02/26/2021 PCP: [name], MD    Brief Narrative:  87 y.o. female with medical history significant for paroxysmal atrial fibrillation, hypertension, history of acute colonic ischemia, CKD 3A, primary hypertension, anemia of chronic disease, presents emergency department from [facility] for chief concern of elevated heart rate.   At bedside patient is able to tell me her name, her age, knows her daughter-in-law Pamala Hurry is at bedside and knows that she is in the hospital.  She provided the majority of the HPI and participated fully in my evaluation and discussion of her care.   She reports that after dinner on 02/25/2021 in the evening her heart rate has been in the 140s and has remained so throughout the night.  She reports she took her a.m. medications and continue to have elevated heart rate.  Her primary care doctor recommended that she be evaluated in the emergency department.   In the emergency department patient received Cardizem gtt and the initiation of this drip started at 1150.  Rate control improved after addition of IV digoxin.  Patient was seen the following morning.  Rate control improved.  Initiated oral regimen of amiodarone, metoprolol, Cardizem.  Cardizem drip weaned off.  Patient stable otherwise.   Assessment & Plan:   Principal Problem:   SVT (supraventricular tachycardia) (HCC) Active Problems:   Essential hypertension   AF (paroxysmal atrial fibrillation) (HCC)   Chronic diarrhea   Cerebrovascular disease   Ischemic bowel disease (HCC)   Colonic ischemia (HCC)   Elevated troponin   Hypokalemia   CKD (chronic kidney disease), stage III (HCC)  Supraventricular tachycardia Patient initially required max Cardizem drip in addition to IV digoxin Rate improved Patient now back in sinus rhythm Plan: Amiodarone 400 mg p.o. twice daily Cardizem CD 360 mg daily Metoprolol titrate 50 mg twice  daily Telemetry monitoring Follow-up 2D echocardiogram Patient remains rate controlled discharge back to [facility] 7/16  History of paroxysmal atrial fibrillation See above for rate control management Continue Eliquis for anticoagulation  Elevated troponin No chest pain, low suspicion for ACS Suspect supply demand ischemia  Essential hypertension Blood pressure controlled Continue current regimen of Cardizem, metoprolol, amiodarone  Anemia of chronic kidney disease Hemoglobin stable, no indication for transfusion  History of ischemic colitis CT abdomen pelvis demonstrates resolving ischemic colitis Patient tolerating p.o. without issue Outpatient follow-up  CKD stage IIIa Creatinine baseline Monitor and avoid nonessential nephrotoxins    DVT prophylaxis: Eliquis Code Status: DNR Family Communication: Daughter at bedside 7/15 Disposition Plan: Status is: Observation  The patient will require care spanning > 2 midnights and should be moved to inpatient because: Inpatient level of care appropriate due to severity of illness  Dispo: The patient is from: Home              Anticipated d/c is to: Home              Patient currently is not medically stable to d/c.   Difficult to place patient No  Patient is just been weaned off gtt. on to oral medications.  Need to confirm hemodynamic stability, tolerance of medications, stability of arrhythmia prior to discharge.  If patient remains rate controlled anticipate discharge back to [facility] independent living on 7/16     Level of care: Progressive Cardiac  Consultants:  Cardiology  Procedures:  None  Antimicrobials:  None   Subjective: Patient seen and examined.  Reports some  dizziness which is chronic issue for her.  No other complaints  Objective: Vitals:   02/27/21 0318 02/27/21 0733 02/27/21 1021 02/27/21 1149  BP: (!) 142/71 (!) 172/80 (!) 147/61 (!) 156/68  Pulse: 97 (!) 107 99 75  Resp: 18 17 20 17    Temp: 97.8 F (36.6 C) 98 F (36.7 C)  97.8 F (36.6 C)  TempSrc:  Oral  Oral  SpO2: 98% 100% 98% 97%  Weight:      Height:        Intake/Output Summary (Last 24 hours) at 02/27/2021 1247 Last data filed at 02/27/2021 1051 Gross per 24 hour  Intake 2301.04 ml  Output --  Net 2301.04 ml   Filed Weights   02/26/21 1100 02/26/21 1846  Weight: 68 kg 70.7 kg    Examination:  General exam: Appears calm and comfortable  Respiratory system: Clear to auscultation. Respiratory effort normal. Cardiovascular system: S1-S2 heard, regular rate, tachycardic, no pedal edema  gastrointestinal system: Abdomen is nondistended, soft and nontender. No organomegaly or masses felt. Normal bowel sounds heard. Central nervous system: Alert and oriented. No focal neurological deficits. Extremities: Symmetric 5 x 5 power. Skin: No rashes, lesions or ulcers Psychiatry: Judgement and insight appear normal. Mood & affect appropriate.     Data Reviewed: I have personally reviewed following labs and imaging studies  CBC: Recent Labs  Lab 02/21/21 0421 02/22/21 0409 02/26/21 1056 02/27/21 0950  WBC 7.1 7.2 6.5 4.7  NEUTROABS  --   --  5.2  --   HGB 9.1* 10.3* 10.7* 10.7*  HCT 27.2* 30.3* 31.2* 31.9*  MCV 97.8 96.2 98.4 96.7  PLT 141* 194 223 025   Basic Metabolic Panel: Recent Labs  Lab 02/21/21 0421 02/22/21 0409 02/26/21 1056 02/27/21 0950  NA 136 140 140 139  K 3.7 3.8 3.4* 3.6  CL 112* 113* 109 109  CO2 19* 23 24 23   GLUCOSE 90 92 161* 144*  BUN 22 15 11 8   CREATININE 1.27* 1.02* 1.19* 1.01*  CALCIUM 7.9* 8.3* 8.2* 8.2*  MG  --   --  1.9  --    GFR: Estimated Creatinine Clearance: 35.3 mL/min (A) (by C-G formula based on SCr of 1.01 mg/dL (H)). Liver Function Tests: Recent Labs  Lab 02/26/21 1056  AST 28  ALT 18  ALKPHOS 50  BILITOT 0.7  PROT 5.8*  ALBUMIN 3.0*   No results for input(s): LIPASE, AMYLASE in the last 168 hours. No results for input(s): AMMONIA in  the last 168 hours. Coagulation Profile: No results for input(s): INR, PROTIME in the last 168 hours. Cardiac Enzymes: No results for input(s): CKTOTAL, CKMB, CKMBINDEX, TROPONINI in the last 168 hours. BNP (last 3 results) No results for input(s): PROBNP in the last 8760 hours. HbA1C: No results for input(s): HGBA1C in the last 72 hours. CBG: No results for input(s): GLUCAP in the last 168 hours. Lipid Profile: No results for input(s): CHOL, HDL, LDLCALC, TRIG, CHOLHDL, LDLDIRECT in the last 72 hours. Thyroid Function Tests: Recent Labs    02/27/21 0950  TSH 2.283   Anemia Panel: No results for input(s): VITAMINB12, FOLATE, FERRITIN, TIBC, IRON, RETICCTPCT in the last 72 hours. Sepsis Labs: No results for input(s): PROCALCITON, LATICACIDVEN in the last 168 hours.  Recent Results (from the past 240 hour(s))  Resp Panel by RT-PCR (Flu A&B, Covid) Nasopharyngeal Swab     Status: None   Collection Time: 02/18/21 12:55 AM   Specimen: Nasopharyngeal Swab; Nasopharyngeal(NP) swabs in vial transport  medium  Result Value Ref Range Status   SARS Coronavirus 2 by RT PCR NEGATIVE NEGATIVE Final    Comment: (NOTE) SARS-CoV-2 target nucleic acids are NOT DETECTED.  The SARS-CoV-2 RNA is generally detectable in upper respiratory specimens during the acute phase of infection. The lowest concentration of SARS-CoV-2 viral copies this assay can detect is 138 copies/mL. A negative result does not preclude SARS-Cov-2 infection and should not be used as the sole basis for treatment or other patient management decisions. A negative result may occur with  improper specimen collection/handling, submission of specimen other than nasopharyngeal swab, presence of viral mutation(s) within the areas targeted by this assay, and inadequate number of viral copies(<138 copies/mL). A negative result must be combined with clinical observations, patient history, and epidemiological information. The expected  result is Negative.  Fact Sheet for Patients:  EntrepreneurPulse.com.au  Fact Sheet for Healthcare Providers:  IncredibleEmployment.be  This test is no t yet approved or cleared by the [place] FDA and  has been authorized for detection and/or diagnosis of SARS-CoV-2 by FDA under an Emergency Use Authorization (EUA). This EUA will remain  in effect (meaning this test can be used) for the duration of the COVID-19 declaration under Section 564(b)(1) of the Act, 21 U.S.C.section 360bbb-3(b)(1), unless the authorization is terminated  or revoked sooner.       Influenza A by PCR NEGATIVE NEGATIVE Final   Influenza B by PCR NEGATIVE NEGATIVE Final    Comment: (NOTE) The Xpert Xpress SARS-CoV-2/FLU/RSV plus assay is intended as an aid in the diagnosis of influenza from Nasopharyngeal swab specimens and should not be used as a sole basis for treatment. Nasal washings and aspirates are unacceptable for Xpert Xpress SARS-CoV-2/FLU/RSV testing.  Fact Sheet for Patients: EntrepreneurPulse.com.au  Fact Sheet for Healthcare Providers: IncredibleEmployment.be  This test is not yet approved or cleared by the [place] FDA and has been authorized for detection and/or diagnosis of SARS-CoV-2 by FDA under an Emergency Use Authorization (EUA). This EUA will remain in effect (meaning this test can be used) for the duration of the COVID-19 declaration under Section 564(b)(1) of the Act, 21 U.S.C. section 360bbb-3(b)(1), unless the authorization is terminated or revoked.  Performed at [facility], [address] [place], [place] [address]   Resp Panel by RT-PCR (Flu A&B, Covid) Nasopharyngeal Swab     Status: None   Collection Time: 02/23/21  8:40 AM   Specimen: Nasopharyngeal Swab; Nasopharyngeal(NP) swabs in vial transport medium  Result Value Ref Range Status   SARS Coronavirus 2 by RT PCR NEGATIVE  NEGATIVE Final    Comment: (NOTE) SARS-CoV-2 target nucleic acids are NOT DETECTED.  The SARS-CoV-2 RNA is generally detectable in upper respiratory specimens during the acute phase of infection. The lowest concentration of SARS-CoV-2 viral copies this assay can detect is 138 copies/mL. A negative result does not preclude SARS-Cov-2 infection and should not be used as the sole basis for treatment or other patient management decisions. A negative result may occur with  improper specimen collection/handling, submission of specimen other than nasopharyngeal swab, presence of viral mutation(s) within the areas targeted by this assay, and inadequate number of viral copies(<138 copies/mL). A negative result must be combined with clinical observations, patient history, and epidemiological information. The expected result is Negative.  Fact Sheet for Patients:  EntrepreneurPulse.com.au  Fact Sheet for Healthcare Providers:  IncredibleEmployment.be  This test is no t yet approved or cleared by the [place] and  has been authorized  for detection and/or diagnosis of SARS-CoV-2 by FDA under an Emergency Use Authorization (EUA). This EUA will remain  in effect (meaning this test can be used) for the duration of the COVID-19 declaration under Section 564(b)(1) of the Act, 21 U.S.C.section 360bbb-3(b)(1), unless the authorization is terminated  or revoked sooner.       Influenza A by PCR NEGATIVE NEGATIVE Final   Influenza B by PCR NEGATIVE NEGATIVE Final    Comment: (NOTE) The Xpert Xpress SARS-CoV-2/FLU/RSV plus assay is intended as an aid in the diagnosis of influenza from Nasopharyngeal swab specimens and should not be used as a sole basis for treatment. Nasal washings and aspirates are unacceptable for Xpert Xpress SARS-CoV-2/FLU/RSV testing.  Fact Sheet for Patients: EntrepreneurPulse.com.au  Fact Sheet for Healthcare  Providers: IncredibleEmployment.be  This test is not yet approved or cleared by the [place] FDA and has been authorized for detection and/or diagnosis of SARS-CoV-2 by FDA under an Emergency Use Authorization (EUA). This EUA will remain in effect (meaning this test can be used) for the duration of the COVID-19 declaration under Section 564(b)(1) of the Act, 21 U.S.C. section 360bbb-3(b)(1), unless the authorization is terminated or revoked.  Performed at [facility], [address] [place], [place] [address]   Resp Panel by RT-PCR (Flu A&B, Covid) Nasopharyngeal Swab     Status: None   Collection Time: 02/26/21 11:41 AM   Specimen: Nasopharyngeal Swab; Nasopharyngeal(NP) swabs in vial transport medium  Result Value Ref Range Status   SARS Coronavirus 2 by RT PCR NEGATIVE NEGATIVE Final    Comment: (NOTE) SARS-CoV-2 target nucleic acids are NOT DETECTED.  The SARS-CoV-2 RNA is generally detectable in upper respiratory specimens during the acute phase of infection. The lowest concentration of SARS-CoV-2 viral copies this assay can detect is 138 copies/mL. A negative result does not preclude SARS-Cov-2 infection and should not be used as the sole basis for treatment or other patient management decisions. A negative result may occur with  improper specimen collection/handling, submission of specimen other than nasopharyngeal swab, presence of viral mutation(s) within the areas targeted by this assay, and inadequate number of viral copies(<138 copies/mL). A negative result must be combined with clinical observations, patient history, and epidemiological information. The expected result is Negative.  Fact Sheet for Patients:  EntrepreneurPulse.com.au  Fact Sheet for Healthcare Providers:  IncredibleEmployment.be  This test is no t yet approved or cleared by the [place] FDA and  has been authorized for  detection and/or diagnosis of SARS-CoV-2 by FDA under an Emergency Use Authorization (EUA). This EUA will remain  in effect (meaning this test can be used) for the duration of the COVID-19 declaration under Section 564(b)(1) of the Act, 21 U.S.C.section 360bbb-3(b)(1), unless the authorization is terminated  or revoked sooner.       Influenza A by PCR NEGATIVE NEGATIVE Final   Influenza B by PCR NEGATIVE NEGATIVE Final    Comment: (NOTE) The Xpert Xpress SARS-CoV-2/FLU/RSV plus assay is intended as an aid in the diagnosis of influenza from Nasopharyngeal swab specimens and should not be used as a sole basis for treatment. Nasal washings and aspirates are unacceptable for Xpert Xpress SARS-CoV-2/FLU/RSV testing.  Fact Sheet for Patients: EntrepreneurPulse.com.au  Fact Sheet for Healthcare Providers: IncredibleEmployment.be  This test is not yet approved or cleared by the [place] FDA and has been authorized for detection and/or diagnosis of SARS-CoV-2 by FDA under an Emergency Use Authorization (EUA). This EUA will remain in effect (meaning this test  can be used) for the duration of the COVID-19 declaration under Section 564(b)(1) of the Act, 21 U.S.C. section 360bbb-3(b)(1), unless the authorization is terminated or revoked.  Performed at [facility], [address] [place], [place] [address]          Radiology Studies: CT ABDOMEN PELVIS W CONTRAST  Result Date: 02/26/2021 CLINICAL DATA:  Abdominal pain EXAM: CT ABDOMEN AND PELVIS WITH CONTRAST TECHNIQUE: Multidetector CT imaging of the abdomen and pelvis was performed using the standard protocol following bolus administration of intravenous contrast. CONTRAST:  15mL OMNIPAQUE IOHEXOL 300 MG/ML  SOLN COMPARISON:  February 20, 2021 FINDINGS: Lower chest: Bibasilar atelectasis. Hepatobiliary: Subcentimeter hypodense hepatic lesion in the right lobe of the liver which is  technically too small to accurately characterized but favored to be a benign cyst. No suspicious hepatic lesions. Gallbladder surgically absent. No biliary ductal dilation. No portal venous gas. Pancreas: Within normal limits. Spleen: Within normal limits. Adrenals/Urinary Tract: Adrenal glands are unremarkable. Kidneys are normal, without renal calculi, solid enhancing lesion, or hydronephrosis. Bladder is unremarkable. Stomach/Bowel: Radiopaque enteric contrast traverses the descending colon. Persistent wall thickening of the ascending and transverse colon. No pneumatosis. Stomach is grossly unremarkable. No pathologic dilation of small bowel. Appendix is not definitely visualized however there is no pericecal inflammation. Terminal ileum appears within normal limits. Left-sided colonic diverticulosis without findings of acute diverticulitis. Vascular/Lymphatic: Aortic and branch vessel atherosclerosis without aneurysmal dilation. No pathologically enlarged abdominal or pelvic lymph nodes. Reproductive: Uterus and bilateral adnexa are unremarkable. Other: Trace pelvic free fluid with fluid in the right pericolic gutter, decreased from prior. No pneumoperitoneum. Musculoskeletal: Diffuse demineralization of bone. Multilevel degenerative changes spine with most notable discogenic disease at L3-L4 with Modic type endplate changes. Degenerative changes bilateral hips and SI joints. No acute osseous abnormality. IMPRESSION: 1. Persistent wall thickening of the ascending and transverse colon. No pneumatosis, portal venous gas, pneumoperitoneum or evidence of obstruction. Given imaging findings of pneumatosis and portal venous gas on prior CTs this likely represents resolving ischemic colitis. However, other etiologies of colitis such as infection and inflammatory are still a differential consideration. 2. Trace pelvic free fluid, decreased from prior. 3. Left-sided colonic diverticulosis without findings of acute  diverticulitis. 4.  Aortic Atherosclerosis (ICD10-I70.0). Electronically Signed   By: Dahlia Bailiff MD   On: 02/26/2021 15:54   DG Chest Portable 1 View  Result Date: 02/26/2021 CLINICAL DATA:  Chest pain. EXAM: PORTABLE CHEST 1 VIEW COMPARISON:  02/20/2021 FINDINGS: 1324 hours. Lungs are hyperexpanded. The lungs are clear without focal pneumonia, edema, pneumothorax or pleural effusion. Interstitial markings are diffusely coarsened with chronic features. Cardiopericardial silhouette is at upper limits of normal for size. The visualized bony structures of the thorax show no acute abnormality. Telemetry leads overlie the chest. IMPRESSION: Hyperexpansion without acute cardiopulmonary findings. Electronically Signed   By: Misty Stanley M.D.   On: 02/26/2021 13:51   ECHOCARDIOGRAM COMPLETE  Result Date: 02/27/2021    ECHOCARDIOGRAM REPORT   Patient Name:   Jamie Hodges Date of Exam: 02/27/2021 Medical Rec #:  [id number]   Height:       67.0 in Accession #:    [id number]  Weight:       155.9 lb Date of Birth:  Apr 13, 1928   BSA:          1.819 m Patient Age:    52 years    BP:           172/80 mmHg Patient Gender: F  HR:           107 bpm. Exam Location:  [facility] Procedure: 2D Echo, Cardiac Doppler and Color Doppler Indications:     Elevated Troponin  History:         Patient has no prior history of Echocardiogram examinations.                  Arrythmias:Atrial Fibrillation; Risk Factors:Hypertension.  Sonographer:     Sherrie Sport RDCS (AE) Referring Phys:  [id number] AMY N COX Diagnosing Phys: Neoma Laming MD  Sonographer Comments: Suboptimal apical window. IMPRESSIONS  1. Left ventricular ejection fraction, by estimation, is 60 to 65%. The left ventricle has normal function. The left ventricle has no regional wall motion abnormalities. There is moderate concentric left ventricular hypertrophy. Left ventricular diastolic parameters are consistent with Grade I diastolic dysfunction (impaired relaxation).  2.  Right ventricular systolic function is normal. The right ventricular size is normal.  3. Left atrial size was mildly dilated.  4. Right atrial size was mildly dilated.  5. The mitral valve is normal in structure. Mild to moderate mitral valve regurgitation. No evidence of mitral stenosis. Severe mitral annular calcification.  6. The aortic valve is normal in structure. Aortic valve regurgitation is not visualized. Mild aortic valve sclerosis is present, with no evidence of aortic valve stenosis.  7. The inferior vena cava is normal in size with greater than 50% respiratory variability, suggesting right atrial pressure of 3 mmHg. FINDINGS  Left Ventricle: Left ventricular ejection fraction, by estimation, is 60 to 65%. The left ventricle has normal function. The left ventricle has no regional wall motion abnormalities. The left ventricular internal cavity size was normal in size. There is  moderate concentric left ventricular hypertrophy. Left ventricular diastolic parameters are consistent with Grade I diastolic dysfunction (impaired relaxation). Right Ventricle: The right ventricular size is normal. No increase in right ventricular wall thickness. Right ventricular systolic function is normal. Left Atrium: Left atrial size was mildly dilated. Right Atrium: Right atrial size was mildly dilated. Pericardium: There is no evidence of pericardial effusion. Mitral Valve: The mitral valve is normal in structure. Severe mitral annular calcification. Mild to moderate mitral valve regurgitation. No evidence of mitral valve stenosis. MV peak gradient, 16.2 mmHg. The mean mitral valve gradient is 6.0 mmHg. Tricuspid Valve: The tricuspid valve is normal in structure. Tricuspid valve regurgitation is mild . No evidence of tricuspid stenosis. Aortic Valve: The aortic valve is normal in structure. Aortic valve regurgitation is not visualized. Mild aortic valve sclerosis is present, with no evidence of aortic valve stenosis. Aortic  valve mean gradient measures 5.5 mmHg. Aortic valve peak gradient measures 8.2 mmHg. Aortic valve area, by VTI measures 3.30 cm. Pulmonic Valve: The pulmonic valve was normal in structure. Pulmonic valve regurgitation is not visualized. No evidence of pulmonic stenosis. Aorta: The aortic root is normal in size and structure. Venous: The inferior vena cava is normal in size with greater than 50% respiratory variability, suggesting right atrial pressure of 3 mmHg. IAS/Shunts: No atrial level shunt detected by color flow Doppler.  LEFT VENTRICLE PLAX 2D LVIDd:         3.47 cm  Diastology LVIDs:         1.95 cm  LV e' medial:    5.22 cm/s LV PW:         1.48 cm  LV E/e' medial:  18.9 LV IVS:        0.93 cm  LV e' lateral:  9.36 cm/s LVOT diam:     2.00 cm  LV E/e' lateral: 10.5 LV SV:         81 LV SV Index:   44 LVOT Area:     3.14 cm  RIGHT VENTRICLE RV Basal diam:  3.44 cm RV S prime:     16.00 cm/s TAPSE (M-mode): 3.1 cm LEFT ATRIUM           Index       RIGHT ATRIUM           Index LA diam:      2.90 cm 1.59 cm/m  RA Area:     15.60 cm LA Vol (A2C): 26.0 ml 14.29 ml/m RA Volume:   44.60 ml  24.52 ml/m LA Vol (A4C): 39.1 ml 21.49 ml/m  AORTIC VALVE                    PULMONIC VALVE AV Area (Vmax):    2.98 cm     PV Vmax:       0.29 m/s AV Area (Vmean):   3.03 cm     PV Peak grad:  0.3 mmHg AV Area (VTI):     3.30 cm AV Vmax:           143.50 cm/s AV Vmean:          109.000 cm/s AV VTI:            0.244 m AV Peak Grad:      8.2 mmHg AV Mean Grad:      5.5 mmHg LVOT Vmax:         136.00 cm/s LVOT Vmean:        105.000 cm/s LVOT VTI:          0.257 m LVOT/AV VTI ratio: 1.05  AORTA Ao Root diam: 2.90 cm MITRAL VALVE                TRICUSPID VALVE MV Area (PHT): 5.06 cm     TR Peak grad:   21.3 mmHg MV Area VTI:   2.39 cm     TR Vmax:        231.00 cm/s MV Peak grad:  16.2 mmHg MV Mean grad:  6.0 mmHg     SHUNTS MV Vmax:       2.01 m/s     Systemic VTI:  0.26 m MV Vmean:      116.0 cm/s   Systemic Diam: 2.00  cm MV Decel Time: 150 msec MV E velocity: 98.50 cm/s MV A velocity: 146.00 cm/s MV E/A ratio:  0.67 Neoma Laming MD Electronically signed by Neoma Laming MD Signature Date/Time: 02/27/2021/12:24:30 PM    Final         Scheduled Meds:  amiodarone  400 mg Oral BID   apixaban  5 mg Oral BID   cholestyramine  4 g Oral BID   diltiazem  360 mg Oral Daily   metoprolol tartrate  50 mg Oral BID   Continuous Infusions:  lactated ringers Stopped (02/27/21 1011)   magnesium sulfate bolus IVPB 2 g (02/27/21 1235)     LOS: 0 days    Time spent: 25 minutes    Sidney Ace, MD [facility] Pager [phone]  If 7PM-7AM, please contact night-coverage 02/27/2021, 12:47 PM

## 2021-02-27 NOTE — Progress Notes (Signed)
*  PRELIMINARY RESULTS* Echocardiogram 2D Echocardiogram has been performed.  Jamie Hodges 02/27/2021, 9:00 AM

## 2021-02-27 NOTE — Progress Notes (Addendum)
Orders for PO amiodarone, cardizem, and metoprolol given. Cardizem drip stopped. Cardiologist and attending aware drip was stopped. Will continue to monitor.

## 2021-02-27 NOTE — Progress Notes (Signed)
SUBJECTIVE: Jamie Hodges is a 87 y.o. female with medical history significant for paroxysmal atrial fibrillation, hypertension, history of acute colonic ischemia, CKD 3A, primary hypertension, anemia of chronic disease.   Patient denies chest pain, shortness of breath, dizziness, palpitations.    Vitals:   02/26/21 1944 02/26/21 2350 02/27/21 0318 02/27/21 0733  BP: 133/60 (!) 135/59 (!) 142/71 (!) 172/80  Pulse: 93 86 97 (!) 107  Resp: 18 18 18 17   Temp: 97.6 F (36.4 C) 98.6 F (37 C) 97.8 F (36.6 C) 98 F (36.7 C)  TempSrc:      SpO2: 99% 98% 98% 100%  Weight:      Height:        Intake/Output Summary (Last 24 hours) at 02/27/2021 0853 Last data filed at 02/27/2021 0333 Gross per 24 hour  Intake 1361.78 ml  Output --  Net 1361.78 ml    LABS: Basic Metabolic Panel: Recent Labs    02/26/21 1056  NA 140  K 3.4*  CL 109  CO2 24  GLUCOSE 161*  BUN 11  CREATININE 1.19*  CALCIUM 8.2*  MG 1.9   Liver Function Tests: Recent Labs    02/26/21 1056  AST 28  ALT 18  ALKPHOS 50  BILITOT 0.7  PROT 5.8*  ALBUMIN 3.0*   No results for input(s): LIPASE, AMYLASE in the last 72 hours. CBC: Recent Labs    02/26/21 1056  WBC 6.5  NEUTROABS 5.2  HGB 10.7*  HCT 31.2*  MCV 98.4  PLT 223   Cardiac Enzymes: No results for input(s): CKTOTAL, CKMB, CKMBINDEX, TROPONINI in the last 72 hours. BNP: Invalid input(s): POCBNP D-Dimer: No results for input(s): DDIMER in the last 72 hours. Hemoglobin A1C: No results for input(s): HGBA1C in the last 72 hours. Fasting Lipid Panel: No results for input(s): CHOL, HDL, LDLCALC, TRIG, CHOLHDL, LDLDIRECT in the last 72 hours. Thyroid Function Tests: No results for input(s): TSH, T4TOTAL, T3FREE, THYROIDAB in the last 72 hours.  Invalid input(s): FREET3 Anemia Panel: No results for input(s): VITAMINB12, FOLATE, FERRITIN, TIBC, IRON, RETICCTPCT in the last 72 hours.   PHYSICAL EXAM General: Well developed, well nourished, in  no acute distress HEENT:  Normocephalic and atramatic Neck:  No JVD.  Lungs: Clear bilaterally to auscultation and percussion. Heart: HRRR . Normal S1 and S2 without gallops or murmurs.  Abdomen: Bowel sounds are positive, abdomen soft and non-tender  Msk:  Back normal, normal gait. Normal strength and tone for age. Extremities: No clubbing, cyanosis or edema.   Neuro: Alert and oriented X 3. Psych:  Good affect, responds appropriately  TELEMETRY:   ASSESSMENT AND PLAN: Echo results pending. Patient to be discharged on amiodarone 400 mg twice a day. Follow up in office on Tuesday 03/03/21 at 10:00 am.  Principal Problem:   SVT (supraventricular tachycardia) (HCC) Active Problems:   Essential hypertension   AF (paroxysmal atrial fibrillation) (HCC)   Chronic diarrhea   Cerebrovascular disease   Ischemic bowel disease (HCC)   Colonic ischemia (HCC)   Elevated troponin   Hypokalemia   CKD (chronic kidney disease), stage III ([place])    Kasheena Sambrano, FNP-C 02/27/2021 8:53 AM

## 2021-02-27 NOTE — Evaluation (Signed)
Physical Therapy Evaluation Patient Details Name: Jamie Hodges MRN: [id number] DOB: [birth year] Today's Date: 02/27/2021   History of Present Illness  Pt is a 87 y/o F admitted on 02/26/21 with c/c of elevated HR. Pt is being treated for supraventricular tachycardia. PMH: paroxysmal a-fib, HTN, acute colonic ischemia, CKD 3A, primary HTN, anemia of chronic disease  Clinical Impression  Pt seen for PT evaluation with daughter in law Jamie Hodges) arriving shortly after start of session. Pt ambulates several laps around pod, with pt requiring min assist for gait without AD & with SPC, close supervision when using RW. Jamie Hodges reports she can stay with pt & provide 24 hr supervision so recommend HHPT f/u. Discussed use of appropriate AD at d/c & pt & Jamie Hodges in agreement. Will continue to follow pt acutely to progress mobility & address balance.     Follow Up Recommendations Supervision/Assistance - 24 hour;Home health PT    Equipment Recommendations  Rolling walker with 5" wheels    Recommendations for Other Services       Precautions / Restrictions Precautions Precautions: Fall Restrictions Weight Bearing Restrictions: No      Mobility  Bed Mobility               General bed mobility comments: not observed, pt received & left in recliner    Transfers Overall transfer level: Needs assistance Equipment used: None Transfers: Sit to/from Stand;Stand Pivot Transfers Sit to Stand: Supervision            Ambulation/Gait Ambulation/Gait assistance: Min assist;Supervision Gait Distance (Feet): 100 Feet (+ 200 ft + 100) Assistive device: Rolling walker (2 wheeled);None;Straight cane Gait Pattern/deviations: Decreased stride length;Decreased step length - right;Decreased step length - left Gait velocity: decreased   General Gait Details: slight posterior bias, cuing/education for proper use of RW & cuing for gait pattern when using SPC (100 ft without AD & min assist, 200 ft with  RW, 50 ft with RW & 50 ft with SPC with CGA)  Stairs            Wheelchair Mobility    Modified Rankin (Stroke Patients Only)       Balance Overall balance assessment: Needs assistance Sitting-balance support: Feet supported;No upper extremity supported Sitting balance-Leahy Scale: Good     Standing balance support: No upper extremity supported;During functional activity Standing balance-Leahy Scale: Fair                               Pertinent Vitals/Pain Pain Assessment: No/denies pain    Home Living Family/patient expects to be discharged to::  (ILF) Living Arrangements: Alone                    Prior Function Level of Independence: Independent         Comments: Independent without AD, driving, no falls     Hand Dominance        Extremity/Trunk Assessment   Upper Extremity Assessment Upper Extremity Assessment: Generalized weakness;Overall [facility] for tasks assessed    Lower Extremity Assessment Lower Extremity Assessment: Generalized weakness       Communication   Communication: HOH (hearing aides)  Cognition Arousal/Alertness: Awake/alert Behavior During Therapy: WFL for tasks assessed/performed Overall Cognitive Status: Within Functional Limits for tasks assessed  General Comments: Very pleasant lady      General Comments General comments (skin integrity, edema, etc.): HR 86 - 112 bpm during session, spent time discussing d/c disposition, use of life alert necklace, need to clear/secure rugs on floor, use of RW until balance improves, transitioning AD with HHPT, 24 hr supervision at d/c with all questions answered to pt & daughter in [place] satisfaction    Exercises     Assessment/Plan    PT Assessment Patient needs continued PT services  PT Problem List Decreased strength;Decreased mobility;Decreased activity tolerance;Decreased balance;Decreased knowledge of use of  DME;Cardiopulmonary status limiting activity       PT Treatment Interventions DME instruction;Therapeutic exercise;Gait training;Balance training;Functional mobility training;Neuromuscular re-education;Modalities;Therapeutic activities;Patient/family education    PT Goals (Current goals can be found in the Care Plan section)  Acute Rehab PT Goals Patient Stated Goal: go home with HHPT f/u PT Goal Formulation: With patient/family Time For Goal Achievement: 03/13/21 Potential to Achieve Goals: Good    Frequency Min 2X/week   Barriers to discharge        Co-evaluation               AM-PAC PT "6 Clicks" Mobility  Outcome Measure Help needed turning from your back to your side while in a flat bed without using bedrails?: None Help needed moving from lying on your back to sitting on the side of a flat bed without using bedrails?: A Little Help needed moving to and from a bed to a chair (including a wheelchair)?: A Little Help needed standing up from a chair using your arms (e.g., wheelchair or bedside chair)?: A Little Help needed to walk in hospital room?: A Little Help needed climbing 3-5 steps with a railing? : A Little 6 Click Score: 19    End of Session Equipment Utilized During Treatment: Gait belt Activity Tolerance: Patient tolerated treatment well Patient left: in chair;with call bell/phone within reach;with family/visitor present Nurse Communication: Mobility status PT Visit Diagnosis: Unsteadiness on feet (R26.81);Muscle weakness (generalized) (M62.81)    Time: 4825-0037 PT Time Calculation (min) (ACUTE ONLY): 28 min   Charges:   PT Evaluation $PT Eval Low Complexity: 1 Low PT Treatments $Therapeutic Activity: 8-22 mins        Lavone Nian, PT, DPT 02/27/21, 3:47 PM   Jamie Hodges 02/27/2021, 3:46 PM

## 2021-02-27 NOTE — TOC Initial Note (Signed)
Transition of Care [facility]) - Initial/Assessment Note    Patient Details  Name: Jamie Hodges MRN: [id number] Date of Birth: [birth year]  Transition of Care [facility]) CM/SW Contact:    Kerin Salen, RN Phone Number: 02/27/2021, 2:27 PM  Clinical Narrative:  Spoke with patient, daughter in law Baird at bedside. Patient is alert and oriented x4, says she lives alone at the [place] for ten years. Patient says she has HHPT/HHOT, ambulates with cane. Daughter in [place] requests PT evaluation for walker. Patient says she was scheduled for discharge today, however feeling dizzy and feels that she may need another day. PT/OT referral done. TOC will continue to track for discharge needs.                Expected Discharge Plan: [place] Barriers to Discharge: Continued Medical Work up   Patient Goals and CMS Choice Patient states their goals for this hospitalization and ongoing recovery are:: To return to [facility], ILF      Expected Discharge Plan and Services Expected Discharge Plan: [place]       Living arrangements for the past 2 months: [place]                                      Prior Living Arrangements/Services Living arrangements for the past 2 months: [place] Lives with:: Self Patient language and need for interpreter reviewed:: Yes Do you feel safe going back to the place where you live?: Yes      Need for Family Participation in Patient Care: Yes (Comment) Care giver support system in place?: Yes (comment) Current home services: Home PT, Home OT Criminal Activity/Legal Involvement Pertinent to Current Situation/Hospitalization: No - Comment as needed  Activities of Daily Living Home Assistive Devices/Equipment: Cane (specify quad or straight) ADL Screening (condition at time of admission) Patient's cognitive ability adequate to safely complete daily activities?: Yes Is the patient  deaf or have difficulty hearing?: Yes Does the patient have difficulty seeing, even when wearing glasses/contacts?: No Does the patient have difficulty concentrating, remembering, or making decisions?: No Patient able to express need for assistance with ADLs?: Yes Does the patient have difficulty dressing or bathing?: No Independently performs ADLs?: Yes (appropriate for developmental age) Does the patient have difficulty walking or climbing stairs?: No Weakness of Legs: None Weakness of Arms/Hands: None  Permission Sought/Granted   Permission granted to share information with : Yes, Verbal Permission Granted  Share Information with NAME: Pamala Hurry     Permission granted to share info w Relationship: Daughter in Sports coach  Permission granted to share info w Contact Information: [phone]  Emotional Assessment Appearance:: Appears stated age Attitude/Demeanor/Rapport: Engaged Affect (typically observed): Accepting Orientation: : Oriented to Self, Oriented to Place, Oriented to  Time, Oriented to Situation Alcohol / Substance Use: Not Applicable Psych Involvement: No (comment)  Admission diagnosis:  Shortness of breath [R06.02] SVT (supraventricular tachycardia) (HCC) [I47.1] Elevated troponin [R77.8] Atrial fibrillation with rapid ventricular response (HCC) [I48.91] Patient Active Problem List   Diagnosis Date Noted   SVT (supraventricular tachycardia) (HCC) 02/26/2021   Elevated troponin 02/26/2021   Hypokalemia 02/26/2021   CKD (chronic kidney disease), stage III ([place]) 02/26/2021   Syncope, vasovagal    Anemia of chronic disease    Weakness    Ischemic bowel disease ([place]) 02/18/2021   Colonic ischemia ([place]) 02/18/2021  Diarrhea    Acute kidney injury superimposed on CKD (HCC)    Lactic acidosis    Right hip pain 03/17/2020   Wheezing 12/06/2019   Aortic atherosclerosis ([place]) 07/20/2019   Chronic renal disease, stage III ([place]) 02/23/2019   Arachnoid cyst 06/29/2018    Cerebrovascular disease 06/29/2018   Vertigo 06/23/2018   Preventative health care 02/17/2018   IBS (irritable bowel syndrome) 05/26/2016   Counseling regarding advanced directives 02/27/2014   Benign paroxysmal positional vertigo 05/11/2013   Chronic diarrhea 06/09/2011   Essential hypertension 09/16/2009   AF (paroxysmal atrial fibrillation) ([place]) 09/16/2009   DIVERTICULOSIS, COLON 09/16/2009   PCP:  [name], MD Pharmacy:   [facility] - [address] [place] [address] Phone: [phone] Fax: [phone]  [facility] [address], [place] - [place] [address] Phone: [phone] Fax: [place] [address] Phone: [phone] Fax: [place], [place] [address] Phone: [phone] Fax: [phone]     Social Determinants of Health ([employer]) Interventions    Readmission Risk Interventions No flowsheet data found.

## 2021-02-28 DIAGNOSIS — I471 Supraventricular tachycardia: Secondary | ICD-10-CM | POA: Diagnosis not present

## 2021-02-28 MED ORDER — DILTIAZEM HCL ER COATED BEADS 360 MG PO CP24: 360.0000 mg | ORAL_CAPSULE | Freq: Every day | ORAL | 0 refills | Status: DC

## 2021-02-28 MED ORDER — AMIODARONE HCL 400 MG PO TABS: 400.0000 mg | ORAL_TABLET | Freq: Two times a day (BID) | ORAL | 0 refills | Status: DC

## 2021-02-28 MED ORDER — CHOLESTYRAMINE 4 G PO PACK: 4.0000 g | PACK | Freq: Two times a day (BID) | ORAL | 0 refills | Status: DC

## 2021-02-28 MED ORDER — METOPROLOL TARTRATE 50 MG PO TABS: 50.0000 mg | ORAL_TABLET | Freq: Two times a day (BID) | ORAL | 0 refills | Status: DC

## 2021-02-28 NOTE — Evaluation (Signed)
Occupational Therapy Evaluation Patient Details Name: Jamie Hodges MRN: [id number] DOB: [birth year] Today's Date: 02/28/2021    History of Present Illness Pt is a 87 y/o F admitted on 02/26/21 with c/c of elevated HR. Pt is being treated for supraventricular tachycardia. PMH: paroxysmal a-fib, HTN, acute colonic ischemia, CKD 3A, primary HTN, anemia of chronic disease   Clinical Impression   Pt seen for OT eval this date in setting of acute hospitalization d/t SVT. Pt reports being INDEP at baseline including driving and no AD for fxl mobility. Lives in [place]. Pt presents with some slight balance sway in standing, and benefits from UE support, at least unilaterally with cane. Pt able to perform STS and fxl mobility with SUPV and no AD, but again, decreased sway with use of cane. Pt able to perform LB ADLs in standing with CGA/SUPV and could benefit from [facility] f/u to ensure improved dynamic balance as it pertains to ADLs/IADLs as well as home setup modifications as needed to reduce risk of falling. No further acute OT needs detected.     Follow Up Recommendations  Home health OT;Supervision - Intermittent    Equipment Recommendations  3 in 1 bedside commode;Tub/shower seat;Other (comment)    Recommendations for Other Services       Precautions / Restrictions Precautions Precautions: Fall Restrictions Weight Bearing Restrictions: No      Mobility Bed Mobility               General bed mobility comments: not observed, pt received & left in recliner    Transfers Overall transfer level: Needs assistance Equipment used: None Transfers: Sit to/from Stand;Stand Pivot Transfers Sit to Stand: Supervision         General transfer comment: some decreased balance, could benefit from UE support on AD. Overall, able to come to stand with no assistance physically    Balance Overall balance assessment: Needs assistance Sitting-balance support: Feet supported;No upper extremity  supported Sitting balance-Leahy Scale: Good     Standing balance support: No upper extremity supported;During functional activity Standing balance-Leahy Scale: Fair Standing balance comment: does not require UE support, but benefits from UE support at least unilaterally with cane                           ADL either performed or assessed with clinical judgement   ADL Overall ADL's : Needs assistance/impaired                                       General ADL Comments: INDEP with sitting UB ADLs, SETUP/SUPV with standing LB ADLs d/t decreased balance     Vision Patient Visual Report: No change from baseline       Perception     Praxis      Pertinent Vitals/Pain Pain Assessment: No/denies pain     Hand Dominance     Extremity/Trunk Assessment Upper Extremity Assessment Upper Extremity Assessment: Overall WFL for tasks assessed;Generalized weakness   Lower Extremity Assessment Lower Extremity Assessment: Overall WFL for tasks assessed;Generalized weakness       Communication Communication Communication: HOH   Cognition Arousal/Alertness: Awake/alert Behavior During Therapy: WFL for tasks assessed/performed Overall Cognitive Status: Within Functional Limits for tasks assessed  General Comments: Very pleasant lady   General Comments       Exercises Other Exercises Other Exercises: ed re: home setup modifcation recommendation for safety including f/u with HHOT and removal of any fall hazard including throw rugs   Shoulder Instructions      Home Living Family/patient expects to be discharged to:: Other (Comment) (ILF at [facility]) Living Arrangements: Alone                                      Prior Functioning/Environment Level of Independence: Independent        Comments: Independent without AD, driving, no falls        OT Problem List: Decreased strength;Decreased  activity tolerance;Impaired balance (sitting and/or standing)      OT Treatment/Interventions: Self-care/ADL training;Therapeutic activities;Therapeutic exercise;Balance training    OT Goals(Current goals can be found in the care plan section) Acute Rehab OT Goals Patient Stated Goal: go home with [place] f/u OT Goal Formulation: All assessment and education complete, DC therapy  OT Frequency:     Barriers to D/C:            Co-evaluation              AM-PAC OT "6 Clicks" Daily Activity     Outcome Measure Help from another person eating meals?: None Help from another person taking care of personal grooming?: None Help from another person toileting, which includes using toliet, bedpan, or urinal?: None Help from another person bathing (including washing, rinsing, drying)?: A Little Help from another person to put on and taking off regular upper body clothing?: None Help from another person to put on and taking off regular lower body clothing?: A Little 6 Click Score: 22   End of Session Equipment Utilized During Treatment: Gait belt;Rolling walker  Activity Tolerance: Patient tolerated treatment well Patient left: with call bell/phone within reach;in chair;with nursing/sitter in room;with family/visitor present  OT Visit Diagnosis: Unsteadiness on feet (R26.81);Muscle weakness (generalized) (M62.81)                Time: 5427-0623 OT Time Calculation (min): 10 min Charges:  OT General Charges $OT Visit: 1 Visit OT Evaluation $OT Eval Moderate Complexity: [place], [place], OTR/L ascom [phone] 02/28/21, 12:55 PM

## 2021-02-28 NOTE — Discharge Summary (Signed)
Physician Discharge Summary  Jamie Hodges GDJ:[id number] DOB: 06-09-1928 DOA: 02/26/2021  PCP: Jamie Carbon, MD  Admit date: 02/26/2021 Discharge date: 02/28/2021  Admitted From: Home Disposition: Home  Recommendations for Outpatient Follow-up:  Follow up with PCP in 1-2 weeks Follow-up cardiology Follow-up GI  Home Health: Yes Equipment/Devices: None  Discharge Condition: Stable CODE STATUS: DNR Diet recommendation: Heart healthy  Brief/Interim Summary: 87 y.o. female with medical history significant for paroxysmal atrial fibrillation, hypertension, history of acute colonic ischemia, CKD 3A, primary hypertension, anemia of chronic disease, presents emergency department from [facility] for chief concern of elevated heart rate.   At bedside patient is able to tell me her name, her age, knows her daughter-in-law Pamala Hurry is at bedside and knows that she is in the hospital.  She provided the majority of the HPI and participated fully in my evaluation and discussion of her care.   She reports that after dinner on 02/25/2021 in the evening her heart rate has been in the 140s and has remained so throughout the night.  She reports she took her a.m. medications and continue to have elevated heart rate.  Her primary care doctor recommended that she be evaluated in the emergency department.   In the emergency department patient received Cardizem gtt and the initiation of this drip started at 1150.  Rate control improved after addition of IV digoxin.  Patient was seen the following morning.  Rate control improved.  Initiated oral regimen of amiodarone, metoprolol, Cardizem.  Cardizem drip weaned off.  Patient stable otherwise.  Patient monitored for 24 hours after transition off diltiazem gtt..  Remained stable on multimodal regimen of diltiazem, metoprolol, amiodarone.  Will discharge on above regimen.  We will follow-up with cardiology on Tuesday, 03/03/2021.  Discharge Diagnoses:  Principal  Problem:   SVT (supraventricular tachycardia) (HCC) Active Problems:   Essential hypertension   AF (paroxysmal atrial fibrillation) (HCC)   Chronic diarrhea   Cerebrovascular disease   Ischemic bowel disease (HCC)   Colonic ischemia (HCC)   Elevated troponin   Hypokalemia   CKD (chronic kidney disease), stage III (HCC)  Supraventricular tachycardia Patient initially required max Cardizem drip in addition to IV digoxin Rate improved Patient now back in sinus rhythm Plan: Amiodarone 400 mg p.o. twice daily Cardizem CD 360 mg daily Metoprolol titrate 50 mg twice daily Follow-up in cardiology clinic on Tuesday, 03/03/2021 for medication titration   History of paroxysmal atrial fibrillation See above for rate control management Continue Eliquis for anticoagulation   Elevated troponin No chest pain, low suspicion for ACS Suspect supply demand ischemia   Essential hypertension Blood pressure controlled Continue current regimen of Cardizem, metoprolol, amiodarone   Anemia of chronic kidney disease Hemoglobin stable, no indication for transfusion   History of ischemic colitis CT abdomen pelvis demonstrates resolving ischemic colitis Patient tolerating p.o. without issue Outpatient follow-up   CKD stage IIIa Creatinine baseline Monitor and avoid nonessential nephrotoxins  Discharge Instructions  Discharge Instructions     Diet - low sodium heart healthy   Complete by: As directed    Increase activity slowly   Complete by: As directed       Allergies as of 02/28/2021       Reactions   Codeine Sulfate    REACTION: rash/ welps   Other    Wild rice   Crestor [rosuvastatin Calcium]    myalgia   Penicillins Hives, Rash   Has patient had a PCN reaction causing immediate rash, facial/tongue/throat swelling, SOB  or lightheadedness with hypotension: Yes Has patient had a PCN reaction causing severe rash involving mucus membranes or skin necrosis: No Has patient had a  PCN reaction that required hospitalization: No Has patient had a PCN reaction occurring within the last 10 years: Yes If all of the above answers are "NO", then may proceed with Cephalosporin use.        Medication List     STOP taking these medications    diltiazem 30 MG tablet Commonly known as: CARDIZEM   diltiazem 360 MG 24 hr capsule Commonly known as: TIAZAC   metoprolol 200 MG 24 hr tablet Commonly known as: TOPROL-XL       TAKE these medications    acetaminophen 325 MG tablet Commonly known as: TYLENOL Take 650 mg by mouth every 6 (six) hours as needed.   albuterol 108 (90 Base) MCG/ACT inhaler Commonly known as: VENTOLIN HFA Inhale 2 puffs into the lungs every 6 (six) hours as needed for wheezing or shortness of breath.   amiodarone 400 MG tablet Commonly known as: PACERONE Take 1 tablet (400 mg total) by mouth 2 (two) times daily.   apixaban 5 MG Tabs tablet Commonly known as: ELIQUIS Take 1 tablet (5 mg total) by mouth 2 (two) times daily.   cholestyramine 4 g packet Commonly known as: QUESTRAN Take 1 packet (4 g total) by mouth 2 (two) times daily.   diltiazem 360 MG 24 hr capsule Commonly known as: CARDIZEM CD Take 1 capsule (360 mg total) by mouth daily. Start taking on: March 01, 2021   EPINEPHrine 0.3 mg/0.3 mL Soaj injection Commonly known as: EPI-PEN Inject 0.3 mg into the muscle as needed (for allergic reaction).   loperamide 2 MG capsule Commonly known as: IMODIUM Take 2 mg by mouth 3 (three) times daily as needed for diarrhea or loose stools.   meclizine 12.5 MG tablet Commonly known as: ANTIVERT Take 1 tablet (12.5 mg total) by mouth 3 (three) times daily as needed for dizziness or nausea.   metoprolol tartrate 50 MG tablet Commonly known as: LOPRESSOR Take 1 tablet (50 mg total) by mouth 2 (two) times daily.   promethazine 12.5 MG tablet Commonly known as: PHENERGAN Take 1 tablet (12.5 mg total) by mouth every 8 (eight) hours  as needed for nausea or vomiting.               Durable Medical Equipment  (From admission, onward)           Start     Ordered   02/28/21 0710  For home use only DME Walker rolling  Once       Question Answer Comment  Walker: With 5 Inch Wheels   Patient needs a walker to treat with the following condition Weakness      02/28/21 0709            Follow-up Information     Gollan, Kathlene November, MD Follow up.   Specialty: Cardiology Why: Per your request, here is office phone number and name for [place] cardiology office Contact information: [place] [address] [phone]         Jonathon Bellows, MD Follow up.   Specialty: Gastroenterology Why: You can establish care with Dr. Vicente Males for followup from ischemic colitis as well as further workup for chronic diarrhea.  In the meantime, you can take Questran up to twice a day for persistent diarrhea.  I recommend daily metamucil for stool bulking  effect.  Can take a daily probiotic, or yogurt with active cultures Contact information: [address] [place] [address] [phone]         Dionisio David, MD Follow up.   Specialty: Cardiology Contact information: [place] [address] [phone]                Allergies  Allergen Reactions   Codeine Sulfate     REACTION: rash/ welps   Other     Wild rice   Crestor [Rosuvastatin Calcium]     myalgia   Penicillins Hives and Rash    Has patient had a PCN reaction causing immediate rash, facial/tongue/throat swelling, SOB or lightheadedness with hypotension: Yes Has patient had a PCN reaction causing severe rash involving mucus membranes or skin necrosis: No Has patient had a PCN reaction that required hospitalization: No Has patient had a PCN reaction occurring within the last 10 years: Yes If all of the above answers are "NO", then may proceed with Cephalosporin use.     Consultations: Cardiology   Procedures/Studies: CT ABDOMEN PELVIS WO CONTRAST  Result Date: 02/20/2021 CLINICAL DATA:  Shortness of breath, feeling unwell, rapid atrial fibrillation, ischemic bowel refusing surgery EXAM: CT ABDOMEN AND PELVIS WITHOUT CONTRAST TECHNIQUE: Multidetector CT imaging of the abdomen and pelvis was performed following the standard protocol without IV contrast. Sagittal and coronal MPR images reconstructed from axial data set. Patient drank dilute oral contrast for exam COMPARISON:  02/18/2021 FINDINGS: Lower chest: Tiny bibasilar pleural effusions and minimal atelectasis Hepatobiliary: Gallbladder surgically absent. Liver normal appearance. Intrahepatic portal venous gas seen on the prior CT exam no longer identified. Pancreas: Normal appearance Spleen: Normal appearance.  Tiny splenule anterior to spleen. Adrenals/Urinary Tract: Adrenal glands, kidneys, ureters, and bladder normal appearance Stomach/Bowel: Appendix not visualized. Persistent wall thickening of the proximal colon, from tip of cecum through mid transverse colon, consistent with colitis. Resolution of pneumatosis seen on previous exam at proximal colon no extra colonic gas. Diverticulosis of descending and sigmoid colon without evidence of diverticulitis. Stomach decompressed. Small bowel loops unremarkable. Vascular/Lymphatic: Atherosclerotic calcifications aorta and iliac arteries without aneurysm. No adenopathy. Reproductive: Atrophic uterus and ovaries. Other: Free fluid in pelvis new since previous exam. No free intraperitoneal air. Small amount of fluid/thickening at the LEFT pericolic gutter and lateral conal fascia. No definite hernia. Musculoskeletal: Osseous demineralization. IMPRESSION: Persistent wall thickening of the proximal colon from tip of cecum through mid transverse colon consistent with colitis; this could be seen with ischemia, infection or inflammatory bowel disease, though the presence of  preceding colonic pneumatosis on this study from 02/18/2021 favors Interval resolution of previously identified portal venous gas. Distal colonic diverticulosis without evidence of diverticulitis. New small amount of nonspecific free pelvic fluid. Tiny bibasilar pleural effusions and minimal atelectasis. Aortic Atherosclerosis (ICD10-I70.0). Electronically Signed   By: Lavonia Dana M.D.   On: 02/20/2021 15:07   CT ABDOMEN PELVIS WO CONTRAST  Result Date: 02/18/2021 CLINICAL DATA:  Right lower quadrant abdominal pain. Upper abdominal pain. Nausea. EXAM: CT ABDOMEN AND PELVIS WITHOUT CONTRAST TECHNIQUE: Multidetector CT imaging of the abdomen and pelvis was performed following the standard protocol without IV contrast. COMPARISON:  CT chest 07/11/2019.  CT abdomen and pelvis 10/13/2014 FINDINGS: Lower chest: Lung bases are clear. Hepatobiliary: Diffuse portal venous gas throughout the liver. Surgical absence of the gallbladder. No bile duct dilatation. Pancreas: Unremarkable. No pancreatic ductal dilatation or surrounding inflammatory changes. Spleen: Normal in size without focal abnormality.  Adrenals/Urinary Tract: Adrenal glands are unremarkable. Kidneys are normal, without renal calculi, focal lesion, or hydronephrosis. Bladder is unremarkable. Stomach/Bowel: Stomach, small bowel, and colon are not abnormally distended. The ascending colon and proximal transverse colon demonstrate diffuse wall thickening with pericolonic edema and mural pneumatosis. Tiny gas in mesenteric portal veins. Changes are likely to represent colonic ischemia. Colonic diverticulosis without evidence of diverticulitis. Appendix is not identified. Opaque medication in the colon. Vascular/Lymphatic: Aortic atherosclerosis. No enlarged abdominal or pelvic lymph nodes. Reproductive: Uterus and bilateral adnexa are unremarkable. Other: No free air or free fluid in the abdomen. Abdominal wall musculature appears intact. Musculoskeletal:  Degenerative changes in the hips and spine. No destructive bone lesions. IMPRESSION: Right colonic wall thickening with colonic pneumatosis and pericolonic edema. Extensive portal venous gas demonstrated in the liver. Changes are likely to represent colonic ischemia. Although there is significant aortic atherosclerosis, only minimal calcification is demonstrated in the superior mesenteric artery. Consider possible mesenteric venous thrombus or arterial embolus, entities which would not be visualized on noncontrast imaging. Critical Value/emergent results were called by telephone at the time of interpretation on 02/18/2021 at 12:33 am to provider [facility] , who verbally acknowledged these results. Electronically Signed   By: Lucienne Capers M.D.   On: 02/18/2021 00:38   DG Forearm Left  Result Date: 02/20/2021 CLINICAL DATA:  Fall.  Pain to both forearms. EXAM: LEFT FOREARM - 2 VIEW COMPARISON:  None. FINDINGS: There is no evidence of fracture or other focal bone lesions. Visualized wrist and elbow grossly unremarkable. Soft tissues are unremarkable. Vascular calcifications. IV tubing overlying the distal forearm. IMPRESSION: No acute displaced fracture or dislocation. Electronically Signed   By: Iven Finn M.D.   On: 02/20/2021 21:08   DG Forearm Right  Result Date: 02/20/2021 CLINICAL DATA:  Fall. EXAM: RIGHT FOREARM - 2 VIEW COMPARISON:  None. FINDINGS: There is no evidence of fracture or other focal bone lesions. Visualized wrist and elbow grossly unremarkable. Soft tissues are unremarkable. Vascular calcifications. IV tubing overlying the distal forearm. IMPRESSION: Negative. Electronically Signed   By: Iven Finn M.D.   On: 02/20/2021 21:09   CT HEAD WO CONTRAST  Result Date: 02/20/2021 CLINICAL DATA:  Recent head trauma and weakness EXAM: CT HEAD WITHOUT CONTRAST TECHNIQUE: Contiguous axial images were obtained from the base of the skull through the vertex without intravenous contrast.  COMPARISON:  06/23/2018 FINDINGS: Brain: Mild atrophic changes and chronic white matter ischemic changes are noted stable from the prior exam. Left-sided arachnoid cyst is seen lateral to the frontal lobe stable in appearance from the prior study. Bilateral basal ganglia calcifications are seen. No acute hemorrhage is noted. Vascular: No hyperdense vessel or unexpected calcification. Skull: Normal. Negative for fracture or focal lesion. Sinuses/Orbits: No acute finding. Other: None. IMPRESSION: Chronic atrophic and ischemic changes stable from the previous exam. Stable left-sided arachnoid cyst. No acute intracranial abnormality noted. Electronically Signed   By: Inez Catalina M.D.   On: 02/20/2021 21:52   CT ABDOMEN PELVIS W CONTRAST  Result Date: 02/26/2021 CLINICAL DATA:  Abdominal pain EXAM: CT ABDOMEN AND PELVIS WITH CONTRAST TECHNIQUE: Multidetector CT imaging of the abdomen and pelvis was performed using the standard protocol following bolus administration of intravenous contrast. CONTRAST:  31mL OMNIPAQUE IOHEXOL 300 MG/ML  SOLN COMPARISON:  February 20, 2021 FINDINGS: Lower chest: Bibasilar atelectasis. Hepatobiliary: Subcentimeter hypodense hepatic lesion in the right lobe of the liver which is technically too small to accurately characterized but favored to be a benign  cyst. No suspicious hepatic lesions. Gallbladder surgically absent. No biliary ductal dilation. No portal venous gas. Pancreas: Within normal limits. Spleen: Within normal limits. Adrenals/Urinary Tract: Adrenal glands are unremarkable. Kidneys are normal, without renal calculi, solid enhancing lesion, or hydronephrosis. Bladder is unremarkable. Stomach/Bowel: Radiopaque enteric contrast traverses the descending colon. Persistent wall thickening of the ascending and transverse colon. No pneumatosis. Stomach is grossly unremarkable. No pathologic dilation of small bowel. Appendix is not definitely visualized however there is no pericecal  inflammation. Terminal ileum appears within normal limits. Left-sided colonic diverticulosis without findings of acute diverticulitis. Vascular/Lymphatic: Aortic and branch vessel atherosclerosis without aneurysmal dilation. No pathologically enlarged abdominal or pelvic lymph nodes. Reproductive: Uterus and bilateral adnexa are unremarkable. Other: Trace pelvic free fluid with fluid in the right pericolic gutter, decreased from prior. No pneumoperitoneum. Musculoskeletal: Diffuse demineralization of bone. Multilevel degenerative changes spine with most notable discogenic disease at L3-L4 with Modic type endplate changes. Degenerative changes bilateral hips and SI joints. No acute osseous abnormality. IMPRESSION: 1. Persistent wall thickening of the ascending and transverse colon. No pneumatosis, portal venous gas, pneumoperitoneum or evidence of obstruction. Given imaging findings of pneumatosis and portal venous gas on prior CTs this likely represents resolving ischemic colitis. However, other etiologies of colitis such as infection and inflammatory are still a differential consideration. 2. Trace pelvic free fluid, decreased from prior. 3. Left-sided colonic diverticulosis without findings of acute diverticulitis. 4.  Aortic Atherosclerosis (ICD10-I70.0). Electronically Signed   By: Dahlia Bailiff MD   On: 02/26/2021 15:54   DG Chest Portable 1 View  Result Date: 02/26/2021 CLINICAL DATA:  Chest pain. EXAM: PORTABLE CHEST 1 VIEW COMPARISON:  02/20/2021 FINDINGS: 1324 hours. Lungs are hyperexpanded. The lungs are clear without focal pneumonia, edema, pneumothorax or pleural effusion. Interstitial markings are diffusely coarsened with chronic features. Cardiopericardial silhouette is at upper limits of normal for size. The visualized bony structures of the thorax show no acute abnormality. Telemetry leads overlie the chest. IMPRESSION: Hyperexpansion without acute cardiopulmonary findings. Electronically Signed    By: Misty Stanley M.D.   On: 02/26/2021 13:51   DG Chest Port 1 View  Result Date: 02/20/2021 CLINICAL DATA:  Shortness of breath EXAM: PORTABLE CHEST 1 VIEW COMPARISON:  CTA chest dated 07/11/2019 FINDINGS: Lungs are clear.  No pleural effusion or pneumothorax. The heart is normal in size.  Thoracic aortic atherosclerosis. IMPRESSION: No evidence of acute cardiopulmonary disease. Electronically Signed   By: Julian Hy M.D.   On: 02/20/2021 00:59   ECHOCARDIOGRAM COMPLETE  Result Date: 02/27/2021    ECHOCARDIOGRAM REPORT   Patient Name:   Jamie Hodges Date of Exam: 02/27/2021 Medical Rec #:  [id number]   Height:       67.0 in Accession #:    [id number]  Weight:       155.9 lb Date of Birth:  September 04, 1927   BSA:          1.819 m Patient Age:    9 years    BP:           172/80 mmHg Patient Gender: F           HR:           107 bpm. Exam Location:  [facility] Procedure: 2D Echo, Cardiac Doppler and Color Doppler Indications:     Elevated Troponin  History:         Patient has no prior history of Echocardiogram examinations.  Arrythmias:Atrial Fibrillation; Risk Factors:Hypertension.  Sonographer:     Sherrie Sport RDCS (AE) Referring Phys:  [id number] AMY N [facility] Diagnosing Phys: Neoma Laming MD  Sonographer Comments: Suboptimal apical window. IMPRESSIONS  1. Left ventricular ejection fraction, by estimation, is 60 to 65%. The left ventricle has normal function. The left ventricle has no regional wall motion abnormalities. There is moderate concentric left ventricular hypertrophy. Left ventricular diastolic parameters are consistent with Grade I diastolic dysfunction (impaired relaxation).  2. Right ventricular systolic function is normal. The right ventricular size is normal.  3. Left atrial size was mildly dilated.  4. Right atrial size was mildly dilated.  5. The mitral valve is normal in structure. Mild to moderate mitral valve regurgitation. No evidence of mitral stenosis. Severe mitral annular  calcification.  6. The aortic valve is normal in structure. Aortic valve regurgitation is not visualized. Mild aortic valve sclerosis is present, with no evidence of aortic valve stenosis.  7. The inferior vena cava is normal in size with greater than 50% respiratory variability, suggesting right atrial pressure of 3 mmHg. FINDINGS  Left Ventricle: Left ventricular ejection fraction, by estimation, is 60 to 65%. The left ventricle has normal function. The left ventricle has no regional wall motion abnormalities. The left ventricular internal cavity size was normal in size. There is  moderate concentric left ventricular hypertrophy. Left ventricular diastolic parameters are consistent with Grade I diastolic dysfunction (impaired relaxation). Right Ventricle: The right ventricular size is normal. No increase in right ventricular wall thickness. Right ventricular systolic function is normal. Left Atrium: Left atrial size was mildly dilated. Right Atrium: Right atrial size was mildly dilated. Pericardium: There is no evidence of pericardial effusion. Mitral Valve: The mitral valve is normal in structure. Severe mitral annular calcification. Mild to moderate mitral valve regurgitation. No evidence of mitral valve stenosis. MV peak gradient, 16.2 mmHg. The mean mitral valve gradient is 6.0 mmHg. Tricuspid Valve: The tricuspid valve is normal in structure. Tricuspid valve regurgitation is mild . No evidence of tricuspid stenosis. Aortic Valve: The aortic valve is normal in structure. Aortic valve regurgitation is not visualized. Mild aortic valve sclerosis is present, with no evidence of aortic valve stenosis. Aortic valve mean gradient measures 5.5 mmHg. Aortic valve peak gradient measures 8.2 mmHg. Aortic valve area, by VTI measures 3.30 cm. Pulmonic Valve: The pulmonic valve was normal in structure. Pulmonic valve regurgitation is not visualized. No evidence of pulmonic stenosis. Aorta: The aortic root is normal in  size and structure. Venous: The inferior vena cava is normal in size with greater than 50% respiratory variability, suggesting right atrial pressure of 3 mmHg. IAS/Shunts: No atrial level shunt detected by color flow Doppler.  LEFT VENTRICLE PLAX 2D LVIDd:         3.47 cm  Diastology LVIDs:         1.95 cm  LV e' medial:    5.22 cm/s LV PW:         1.48 cm  LV E/e' medial:  18.9 LV IVS:        0.93 cm  LV e' lateral:   9.36 cm/s LVOT diam:     2.00 cm  LV E/e' lateral: 10.5 LV SV:         81 LV SV Index:   44 LVOT Area:     3.14 cm  RIGHT VENTRICLE RV Basal diam:  3.44 cm RV S prime:     16.00 cm/s TAPSE (M-mode): 3.1 cm LEFT ATRIUM  Index       RIGHT ATRIUM           Index LA diam:      2.90 cm 1.59 cm/m  RA Area:     15.60 cm LA Vol (A2C): 26.0 ml 14.29 ml/m RA Volume:   44.60 ml  24.52 ml/m LA Vol (A4C): 39.1 ml 21.49 ml/m  AORTIC VALVE                    PULMONIC VALVE AV Area (Vmax):    2.98 cm     PV Vmax:       0.29 m/s AV Area (Vmean):   3.03 cm     PV Peak grad:  0.3 mmHg AV Area (VTI):     3.30 cm AV Vmax:           143.50 cm/s AV Vmean:          109.000 cm/s AV VTI:            0.244 m AV Peak Grad:      8.2 mmHg AV Mean Grad:      5.5 mmHg LVOT Vmax:         136.00 cm/s LVOT Vmean:        105.000 cm/s LVOT VTI:          0.257 m LVOT/AV VTI ratio: 1.05  AORTA Ao Root diam: 2.90 cm MITRAL VALVE                TRICUSPID VALVE MV Area (PHT): 5.06 cm     TR Peak grad:   21.3 mmHg MV Area VTI:   2.39 cm     TR Vmax:        231.00 cm/s MV Peak grad:  16.2 mmHg MV Mean grad:  6.0 mmHg     SHUNTS MV Vmax:       2.01 m/s     Systemic VTI:  0.26 m MV Vmean:      116.0 cm/s   Systemic Diam: 2.00 cm MV Decel Time: 150 msec MV E velocity: 98.50 cm/s MV A velocity: 146.00 cm/s MV E/A ratio:  0.67 Neoma Laming MD Electronically signed by Neoma Laming MD Signature Date/Time: 02/27/2021/12:24:30 PM    Final    (Echo, Carotid, EGD, Colonoscopy, ERCP)    Subjective: Seen and examined the day of  discharge.  Stable no distress.  Tachycardia and palpitations have resolved.  Hemodynamically stable.  Discharge Exam: Vitals:   02/28/21 0434 02/28/21 0725  BP: (!) 153/90 (!) 161/78  Pulse: 88 97  Resp: 16 18  Temp: 97.6 F (36.4 C) 97.9 F (36.6 C)  SpO2: 97% 100%   Vitals:   02/27/21 1619 02/27/21 1921 02/28/21 0434 02/28/21 0725  BP: (!) 150/65 (!) 167/68 (!) 153/90 (!) 161/78  Pulse: 86 92 88 97  Resp: 17 19 16 18   Temp: 97.8 F (36.6 C) 98.7 F (37.1 C) 97.6 F (36.4 C) 97.9 F (36.6 C)  TempSrc:  Oral Oral Oral  SpO2: 100% 100% 97% 100%  Weight:      Height:        General: Pt is alert, awake, not in acute distress Cardiovascular: RRR, S1/S2 +, no rubs, no gallops Respiratory: CTA bilaterally, no wheezing, no rhonchi Abdominal: Soft, NT, ND, bowel sounds + Extremities: no edema, no cyanosis    The results of significant diagnostics from this hospitalization (including imaging, microbiology, ancillary and laboratory) are listed below for reference.  Microbiology: Recent Results (from the past 240 hour(s))  Resp Panel by RT-PCR (Flu A&B, Covid) Nasopharyngeal Swab     Status: None   Collection Time: 02/23/21  8:40 AM   Specimen: Nasopharyngeal Swab; Nasopharyngeal(NP) swabs in vial transport medium  Result Value Ref Range Status   SARS Coronavirus 2 by RT PCR NEGATIVE NEGATIVE Final    Comment: (NOTE) SARS-CoV-2 target nucleic acids are NOT DETECTED.  The SARS-CoV-2 RNA is generally detectable in upper respiratory specimens during the acute phase of infection. The lowest concentration of SARS-CoV-2 viral copies this assay can detect is 138 copies/mL. A negative result does not preclude SARS-Cov-2 infection and should not be used as the sole basis for treatment or other patient management decisions. A negative result may occur with  improper specimen collection/handling, submission of specimen other than nasopharyngeal swab, presence of viral  mutation(s) within the areas targeted by this assay, and inadequate number of viral copies(<138 copies/mL). A negative result must be combined with clinical observations, patient history, and epidemiological information. The expected result is Negative.  Fact Sheet for Patients:  EntrepreneurPulse.com.au  Fact Sheet for Healthcare Providers:  IncredibleEmployment.be  This test is no t yet approved or cleared by the [place] FDA and  has been authorized for detection and/or diagnosis of SARS-CoV-2 by FDA under an Emergency Use Authorization (EUA). This EUA will remain  in effect (meaning this test can be used) for the duration of the COVID-19 declaration under Section 564(b)(1) of the Act, 21 U.S.C.section 360bbb-3(b)(1), unless the authorization is terminated  or revoked sooner.       Influenza A by PCR NEGATIVE NEGATIVE Final   Influenza B by PCR NEGATIVE NEGATIVE Final    Comment: (NOTE) The Xpert Xpress SARS-CoV-2/FLU/RSV plus assay is intended as an aid in the diagnosis of influenza from Nasopharyngeal swab specimens and should not be used as a sole basis for treatment. Nasal washings and aspirates are unacceptable for Xpert Xpress SARS-CoV-2/FLU/RSV testing.  Fact Sheet for Patients: EntrepreneurPulse.com.au  Fact Sheet for Healthcare Providers: IncredibleEmployment.be  This test is not yet approved or cleared by the [place] FDA and has been authorized for detection and/or diagnosis of SARS-CoV-2 by FDA under an Emergency Use Authorization (EUA). This EUA will remain in effect (meaning this test can be used) for the duration of the COVID-19 declaration under Section 564(b)(1) of the Act, 21 U.S.C. section 360bbb-3(b)(1), unless the authorization is terminated or revoked.  Performed at [facility], [address] [place], [place] [address]   Resp Panel by RT-PCR (Flu A&B,  Covid) Nasopharyngeal Swab     Status: None   Collection Time: 02/26/21 11:41 AM   Specimen: Nasopharyngeal Swab; Nasopharyngeal(NP) swabs in vial transport medium  Result Value Ref Range Status   SARS Coronavirus 2 by RT PCR NEGATIVE NEGATIVE Final    Comment: (NOTE) SARS-CoV-2 target nucleic acids are NOT DETECTED.  The SARS-CoV-2 RNA is generally detectable in upper respiratory specimens during the acute phase of infection. The lowest concentration of SARS-CoV-2 viral copies this assay can detect is 138 copies/mL. A negative result does not preclude SARS-Cov-2 infection and should not be used as the sole basis for treatment or other patient management decisions. A negative result may occur with  improper specimen collection/handling, submission of specimen other than nasopharyngeal swab, presence of viral mutation(s) within the areas targeted by this assay, and inadequate number of viral copies(<138 copies/mL). A negative result must be combined with clinical observations, patient history, and epidemiological  information. The expected result is Negative.  Fact Sheet for Patients:  EntrepreneurPulse.com.au  Fact Sheet for Healthcare Providers:  IncredibleEmployment.be  This test is no t yet approved or cleared by the [place] FDA and  has been authorized for detection and/or diagnosis of SARS-CoV-2 by FDA under an Emergency Use Authorization (EUA). This EUA will remain  in effect (meaning this test can be used) for the duration of the COVID-19 declaration under Section 564(b)(1) of the Act, 21 U.S.C.section 360bbb-3(b)(1), unless the authorization is terminated  or revoked sooner.       Influenza A by PCR NEGATIVE NEGATIVE Final   Influenza B by PCR NEGATIVE NEGATIVE Final    Comment: (NOTE) The Xpert Xpress SARS-CoV-2/FLU/RSV plus assay is intended as an aid in the diagnosis of influenza from Nasopharyngeal swab specimens and should  not be used as a sole basis for treatment. Nasal washings and aspirates are unacceptable for Xpert Xpress SARS-CoV-2/FLU/RSV testing.  Fact Sheet for Patients: EntrepreneurPulse.com.au  Fact Sheet for Healthcare Providers: IncredibleEmployment.be  This test is not yet approved or cleared by the [place] FDA and has been authorized for detection and/or diagnosis of SARS-CoV-2 by FDA under an Emergency Use Authorization (EUA). This EUA will remain in effect (meaning this test can be used) for the duration of the COVID-19 declaration under Section 564(b)(1) of the Act, 21 U.S.C. section 360bbb-3(b)(1), unless the authorization is terminated or revoked.  Performed at [facility], [address] [place], [place] [address]   Gastrointestinal Panel by PCR , Stool     Status: None   Collection Time: 02/27/21  3:00 PM   Specimen: Stool  Result Value Ref Range Status   Campylobacter species NOT DETECTED NOT DETECTED Final   Plesimonas shigelloides NOT DETECTED NOT DETECTED Final   Salmonella species NOT DETECTED NOT DETECTED Final   Yersinia enterocolitica NOT DETECTED NOT DETECTED Final   Vibrio species NOT DETECTED NOT DETECTED Final   Vibrio cholerae NOT DETECTED NOT DETECTED Final   Enteroaggregative E coli (EAEC) NOT DETECTED NOT DETECTED Final   Enteropathogenic E coli (EPEC) NOT DETECTED NOT DETECTED Final   Enterotoxigenic E coli (ETEC) NOT DETECTED NOT DETECTED Final   Shiga like toxin producing E coli (STEC) NOT DETECTED NOT DETECTED Final   Shigella/Enteroinvasive E coli (EIEC) NOT DETECTED NOT DETECTED Final   Cryptosporidium NOT DETECTED NOT DETECTED Final   Cyclospora cayetanensis NOT DETECTED NOT DETECTED Final   Entamoeba histolytica NOT DETECTED NOT DETECTED Final   Giardia lamblia NOT DETECTED NOT DETECTED Final   Adenovirus F40/41 NOT DETECTED NOT DETECTED Final   Astrovirus NOT DETECTED NOT DETECTED Final    Norovirus GI/GII NOT DETECTED NOT DETECTED Final   Rotavirus A NOT DETECTED NOT DETECTED Final   Sapovirus (I, II, IV, and V) NOT DETECTED NOT DETECTED Final    Comment: Performed at [facility], [address] [place], [place] [address]     Labs: BNP (last 3 results) No results for input(s): BNP in the last 8760 hours. Basic Metabolic Panel: Recent Labs  Lab 02/22/21 0409 02/26/21 1056 02/27/21 0950  NA 140 140 139  K 3.8 3.4* 3.6  CL 113* 109 109  CO2 23 24 23   GLUCOSE 92 161* 144*  BUN 15 11 8   CREATININE 1.02* 1.19* 1.01*  CALCIUM 8.3* 8.2* 8.2*  MG  --  1.9  --    Liver Function Tests: Recent Labs  Lab 02/26/21 1056  AST 28  ALT 18  ALKPHOS 50  BILITOT 0.7  PROT 5.8*  ALBUMIN 3.0*   No results for input(s): LIPASE, AMYLASE in the last 168 hours. No results for input(s): AMMONIA in the last 168 hours. CBC: Recent Labs  Lab 02/22/21 0409 02/26/21 1056 02/27/21 0950  WBC 7.2 6.5 4.7  NEUTROABS  --  5.2  --   HGB 10.3* 10.7* 10.7*  HCT 30.3* 31.2* 31.9*  MCV 96.2 98.4 96.7  PLT 194 223 198   Cardiac Enzymes: No results for input(s): CKTOTAL, CKMB, CKMBINDEX, TROPONINI in the last 168 hours. BNP: Invalid input(s): POCBNP CBG: No results for input(s): GLUCAP in the last 168 hours. D-Dimer No results for input(s): DDIMER in the last 72 hours. Hgb A1c No results for input(s): HGBA1C in the last 72 hours. Lipid Profile No results for input(s): CHOL, HDL, LDLCALC, TRIG, CHOLHDL, LDLDIRECT in the last 72 hours. Thyroid function studies Recent Labs    02/27/21 0950  TSH 2.283   Anemia work up No results for input(s): VITAMINB12, FOLATE, FERRITIN, TIBC, IRON, RETICCTPCT in the last 72 hours. Urinalysis    Component Value Date/Time   COLORURINE YELLOW (A) 02/18/2021 0345   APPEARANCEUR HAZY (A) 02/18/2021 0345   APPEARANCEUR Cloudy 04/24/2013 2036   LABSPEC 1.018 02/18/2021 0345   LABSPEC 1.019 04/24/2013 2036   PHURINE 5.0 02/18/2021  0345   GLUCOSEU NEGATIVE 02/18/2021 0345   GLUCOSEU Negative 04/24/2013 2036   HGBUR NEGATIVE 02/18/2021 0345   BILIRUBINUR NEGATIVE 02/18/2021 0345   BILIRUBINUR Negative 03/09/2018 1027   BILIRUBINUR Negative 04/24/2013 2036   KETONESUR NEGATIVE 02/18/2021 0345   PROTEINUR NEGATIVE 02/18/2021 0345   UROBILINOGEN 0.2 03/09/2018 1027   NITRITE NEGATIVE 02/18/2021 0345   LEUKOCYTESUR SMALL (A) 02/18/2021 0345   LEUKOCYTESUR 3+ 04/24/2013 2036   Sepsis Labs Invalid input(s): PROCALCITONIN,  WBC,  LACTICIDVEN Microbiology Recent Results (from the past 240 hour(s))  Resp Panel by RT-PCR (Flu A&B, Covid) Nasopharyngeal Swab     Status: None   Collection Time: 02/23/21  8:40 AM   Specimen: Nasopharyngeal Swab; Nasopharyngeal(NP) swabs in vial transport medium  Result Value Ref Range Status   SARS Coronavirus 2 by RT PCR NEGATIVE NEGATIVE Final    Comment: (NOTE) SARS-CoV-2 target nucleic acids are NOT DETECTED.  The SARS-CoV-2 RNA is generally detectable in upper respiratory specimens during the acute phase of infection. The lowest concentration of SARS-CoV-2 viral copies this assay can detect is 138 copies/mL. A negative result does not preclude SARS-Cov-2 infection and should not be used as the sole basis for treatment or other patient management decisions. A negative result may occur with  improper specimen collection/handling, submission of specimen other than nasopharyngeal swab, presence of viral mutation(s) within the areas targeted by this assay, and inadequate number of viral copies(<138 copies/mL). A negative result must be combined with clinical observations, patient history, and epidemiological information. The expected result is Negative.  Fact Sheet for Patients:  EntrepreneurPulse.com.au  Fact Sheet for Healthcare Providers:  IncredibleEmployment.be  This test is no t yet approved or cleared by the [place] FDA and  has  been authorized for detection and/or diagnosis of SARS-CoV-2 by FDA under an Emergency Use Authorization (EUA). This EUA will remain  in effect (meaning this test can be used) for the duration of the COVID-19 declaration under Section 564(b)(1) of the Act, 21 U.S.C.section 360bbb-3(b)(1), unless the authorization is terminated  or revoked sooner.       Influenza A by PCR NEGATIVE NEGATIVE Final   Influenza B  by PCR NEGATIVE NEGATIVE Final    Comment: (NOTE) The Xpert Xpress SARS-CoV-2/FLU/RSV plus assay is intended as an aid in the diagnosis of influenza from Nasopharyngeal swab specimens and should not be used as a sole basis for treatment. Nasal washings and aspirates are unacceptable for Xpert Xpress SARS-CoV-2/FLU/RSV testing.  Fact Sheet for Patients: EntrepreneurPulse.com.au  Fact Sheet for Healthcare Providers: IncredibleEmployment.be  This test is not yet approved or cleared by the [place] FDA and has been authorized for detection and/or diagnosis of SARS-CoV-2 by FDA under an Emergency Use Authorization (EUA). This EUA will remain in effect (meaning this test can be used) for the duration of the COVID-19 declaration under Section 564(b)(1) of the Act, 21 U.S.C. section 360bbb-3(b)(1), unless the authorization is terminated or revoked.  Performed at [facility], [address] [place], [place] [address]   Resp Panel by RT-PCR (Flu A&B, Covid) Nasopharyngeal Swab     Status: None   Collection Time: 02/26/21 11:41 AM   Specimen: Nasopharyngeal Swab; Nasopharyngeal(NP) swabs in vial transport medium  Result Value Ref Range Status   SARS Coronavirus 2 by RT PCR NEGATIVE NEGATIVE Final    Comment: (NOTE) SARS-CoV-2 target nucleic acids are NOT DETECTED.  The SARS-CoV-2 RNA is generally detectable in upper respiratory specimens during the acute phase of infection. The lowest concentration of SARS-CoV-2 viral copies  this assay can detect is 138 copies/mL. A negative result does not preclude SARS-Cov-2 infection and should not be used as the sole basis for treatment or other patient management decisions. A negative result may occur with  improper specimen collection/handling, submission of specimen other than nasopharyngeal swab, presence of viral mutation(s) within the areas targeted by this assay, and inadequate number of viral copies(<138 copies/mL). A negative result must be combined with clinical observations, patient history, and epidemiological information. The expected result is Negative.  Fact Sheet for Patients:  EntrepreneurPulse.com.au  Fact Sheet for Healthcare Providers:  IncredibleEmployment.be  This test is no t yet approved or cleared by the [place] FDA and  has been authorized for detection and/or diagnosis of SARS-CoV-2 by FDA under an Emergency Use Authorization (EUA). This EUA will remain  in effect (meaning this test can be used) for the duration of the COVID-19 declaration under Section 564(b)(1) of the Act, 21 U.S.C.section 360bbb-3(b)(1), unless the authorization is terminated  or revoked sooner.       Influenza A by PCR NEGATIVE NEGATIVE Final   Influenza B by PCR NEGATIVE NEGATIVE Final    Comment: (NOTE) The Xpert Xpress SARS-CoV-2/FLU/RSV plus assay is intended as an aid in the diagnosis of influenza from Nasopharyngeal swab specimens and should not be used as a sole basis for treatment. Nasal washings and aspirates are unacceptable for Xpert Xpress SARS-CoV-2/FLU/RSV testing.  Fact Sheet for Patients: EntrepreneurPulse.com.au  Fact Sheet for Healthcare Providers: IncredibleEmployment.be  This test is not yet approved or cleared by the [place] FDA and has been authorized for detection and/or diagnosis of SARS-CoV-2 by FDA under an Emergency Use Authorization (EUA). This EUA  will remain in effect (meaning this test can be used) for the duration of the COVID-19 declaration under Section 564(b)(1) of the Act, 21 U.S.C. section 360bbb-3(b)(1), unless the authorization is terminated or revoked.  Performed at [facility], [address] [place], [place] [address]   Gastrointestinal Panel by PCR , Stool     Status: None   Collection Time: 02/27/21  3:00 PM   Specimen: Stool  Result Value  Ref Range Status   Campylobacter species NOT DETECTED NOT DETECTED Final   Plesimonas shigelloides NOT DETECTED NOT DETECTED Final   Salmonella species NOT DETECTED NOT DETECTED Final   Yersinia enterocolitica NOT DETECTED NOT DETECTED Final   Vibrio species NOT DETECTED NOT DETECTED Final   Vibrio cholerae NOT DETECTED NOT DETECTED Final   Enteroaggregative E coli (EAEC) NOT DETECTED NOT DETECTED Final   Enteropathogenic E coli (EPEC) NOT DETECTED NOT DETECTED Final   Enterotoxigenic E coli (ETEC) NOT DETECTED NOT DETECTED Final   Shiga like toxin producing E coli (STEC) NOT DETECTED NOT DETECTED Final   Shigella/Enteroinvasive E coli (EIEC) NOT DETECTED NOT DETECTED Final   Cryptosporidium NOT DETECTED NOT DETECTED Final   Cyclospora cayetanensis NOT DETECTED NOT DETECTED Final   Entamoeba histolytica NOT DETECTED NOT DETECTED Final   Giardia lamblia NOT DETECTED NOT DETECTED Final   Adenovirus F40/41 NOT DETECTED NOT DETECTED Final   Astrovirus NOT DETECTED NOT DETECTED Final   Norovirus GI/GII NOT DETECTED NOT DETECTED Final   Rotavirus A NOT DETECTED NOT DETECTED Final   Sapovirus (I, II, IV, and V) NOT DETECTED NOT DETECTED Final    Comment: Performed at [facility], [address] [place], [place] [address]     Time coordinating discharge: Over 30 minutes  SIGNED:   Sidney Ace, MD  [facility] 02/28/2021, 2:28 PM Pager   If 7PM-7AM, please contact night-coverage

## 2021-02-28 NOTE — TOC Transition Note (Signed)
Transition of Care [facility]) - CM/SW Discharge Note   Patient Details  Name: Jamie Hodges MRN: [id number] Date of Birth: 08/24/1927  [facility] CM/SW Contact:  Boris Sharper, LCSW Phone Number: 02/28/2021, 10:40 AM   Clinical Narrative:    Pt medically stable for discharge per MD. Pt will be returning to her independent living facility with [facility]. CSW arranged HH PT and OT with [facility]. Pt's family is aware of discharge.   Final next level of care: [place] Barriers to Discharge: No Barriers Identified   Patient Goals and CMS Choice Patient states their goals for this hospitalization and ongoing recovery are:: To return to [facility], [place]      Discharge Placement                  Name of family member notified: Mikki Santee Patient and family notified of of transfer: 02/28/21  Discharge Plan and Services                            [place]: [place] ([place]) Date [place]: 02/28/21 Time [place]: 1039 Representative spoke with at [place]: Floydene Flock  Social Determinants of Health (SDOH) Interventions     Readmission Risk Interventions No flowsheet data found.

## 2021-03-01 DIAGNOSIS — Z8619 Personal history of other infectious and parasitic diseases: Secondary | ICD-10-CM | POA: Diagnosis not present

## 2021-03-01 DIAGNOSIS — I480 Paroxysmal atrial fibrillation: Secondary | ICD-10-CM | POA: Diagnosis not present

## 2021-03-01 DIAGNOSIS — E876 Hypokalemia: Secondary | ICD-10-CM | POA: Diagnosis not present

## 2021-03-01 DIAGNOSIS — Z9181 History of falling: Secondary | ICD-10-CM | POA: Diagnosis not present

## 2021-03-01 DIAGNOSIS — R197 Diarrhea, unspecified: Secondary | ICD-10-CM | POA: Diagnosis not present

## 2021-03-01 DIAGNOSIS — Z9049 Acquired absence of other specified parts of digestive tract: Secondary | ICD-10-CM | POA: Diagnosis not present

## 2021-03-01 DIAGNOSIS — Z86010 Personal history of colonic polyps: Secondary | ICD-10-CM | POA: Diagnosis not present

## 2021-03-01 DIAGNOSIS — Z87891 Personal history of nicotine dependence: Secondary | ICD-10-CM | POA: Diagnosis not present

## 2021-03-01 DIAGNOSIS — K55039 Acute (reversible) ischemia of large intestine, extent unspecified: Secondary | ICD-10-CM | POA: Diagnosis not present

## 2021-03-01 DIAGNOSIS — I2510 Atherosclerotic heart disease of native coronary artery without angina pectoris: Secondary | ICD-10-CM | POA: Diagnosis not present

## 2021-03-01 DIAGNOSIS — E785 Hyperlipidemia, unspecified: Secondary | ICD-10-CM | POA: Diagnosis not present

## 2021-03-01 DIAGNOSIS — Z7901 Long term (current) use of anticoagulants: Secondary | ICD-10-CM | POA: Diagnosis not present

## 2021-03-01 DIAGNOSIS — N1831 Chronic kidney disease, stage 3a: Secondary | ICD-10-CM | POA: Diagnosis not present

## 2021-03-01 DIAGNOSIS — I129 Hypertensive chronic kidney disease with stage 1 through stage 4 chronic kidney disease, or unspecified chronic kidney disease: Secondary | ICD-10-CM | POA: Diagnosis not present

## 2021-03-01 DIAGNOSIS — I471 Supraventricular tachycardia: Secondary | ICD-10-CM | POA: Diagnosis not present

## 2021-03-01 DIAGNOSIS — K5730 Diverticulosis of large intestine without perforation or abscess without bleeding: Secondary | ICD-10-CM | POA: Diagnosis not present

## 2021-03-01 DIAGNOSIS — I679 Cerebrovascular disease, unspecified: Secondary | ICD-10-CM | POA: Diagnosis not present

## 2021-03-01 DIAGNOSIS — D631 Anemia in chronic kidney disease: Secondary | ICD-10-CM | POA: Diagnosis not present

## 2021-03-02 ENCOUNTER — Telehealth: Payer: Self-pay

## 2021-03-02 ENCOUNTER — Telehealth: Payer: Self-pay | Admitting: *Deleted

## 2021-03-02 NOTE — Telephone Encounter (Signed)
Patient's son Dr. Georgana Curio left a voicemail stating that his mom was in the hospital and is now at home. Dr. Janne Napoleon requested that Dr. Silvio Pate give him a call back about his mom.

## 2021-03-02 NOTE — Telephone Encounter (Signed)
Okay for those orders. I would not have her take any of those questioned medications She is coming in tomorrow at noon

## 2021-03-02 NOTE — Telephone Encounter (Signed)
Aura Dials PT with [facility] left v/m requesting verbal orders HH PT 2 x a wk for 3 wks and 1 x a wk for 6 wks. Also Sharee Pimple said pt has HCTZ 25 mg in her home but is not on hospital med list. Pt is not taking HCTZ 25 mg at this time.There are also some dosage changes with other meds that Sharee Pimple needs to go over and request cb. I spoke with Sharee Pimple; promethazine 12.5 mg prn med for N&V q8h; pt does not have that med but is listed on pts med list; cholestyramine 4 g per packet. Take 1 packet bid is instructions on med list and pt said taste bad and difficult to swallow and pt taking every 4 days. Sharee Pimple said no issue with pt having BMs.Sharee Pimple request cb when reviewed by Dr Silvio Pate.

## 2021-03-02 NOTE — Telephone Encounter (Signed)
I spoke to her son Laurey Arrow who is at her [place] now. She is napping now so I didn't get to talk to her--but she is doing okay They are working on a lifeline through [employer] and daily aides I have agreed to add her on at noon tomorrow and Laurey Arrow will have son Mikki Santee on speaker phone

## 2021-03-02 NOTE — Telephone Encounter (Signed)
Transition Care Management Follow-up Telephone Call Date of discharge and from where: 02/28/2021, [facility] How have you been since you were released from the hospital? Spoke with Pamala Hurry (HIPAA verified). Patient is doing okay. Any questions or concerns? No  Items Reviewed: Did the pt receive and understand the discharge instructions provided? Yes  Medications obtained and verified? Yes  Other? No  Any new allergies since your discharge? No  Dietary orders reviewed? Yes Do you have support at home? Yes   Home Care and Equipment/Supplies: Were home health services ordered? not applicable If so, what is the name of the agency? N/A  Has the agency set up a time to come to the patient's home? not applicable Were any new equipment or medical supplies ordered?  No What is the name of the medical supply agency? N/A Were you able to get the supplies/equipment? not applicable Do you have any questions related to the use of the equipment or supplies? No  Functional Questionnaire: (I = Independent and D = Dependent) ADLs: I  Bathing/Dressing- I  Meal Prep- I  Eating- I  Maintaining continence- I  Transferring/Ambulation- I  Managing Meds- I  Follow up appointments reviewed:  PCP Hospital f/u appt confirmed?  Patient has physical scheduled on 03/04/2021. The family is requesting for patient to be seen today or tomorrow because they will be leaving early Wednesday. Gardiner Rhyme I will send message to Dr. Silvio Pate and he will have someone call and let them know if this is possible.  Call Pamala Hurry back at [facility] f/u appt confirmed?  Follow up with cardiology and GI   Are transportation arrangements needed? No  If their condition worsens, is the pt aware to call PCP or go to the Emergency Dept.? Yes Was the patient provided with contact information for the PCP's office or ED? Yes Was to pt encouraged to call back with questions or concerns? Yes

## 2021-03-03 ENCOUNTER — Ambulatory Visit: Payer: Medicare Other | Admitting: Internal Medicine

## 2021-03-03 DIAGNOSIS — I480 Paroxysmal atrial fibrillation: Secondary | ICD-10-CM | POA: Diagnosis not present

## 2021-03-03 DIAGNOSIS — D631 Anemia in chronic kidney disease: Secondary | ICD-10-CM | POA: Diagnosis not present

## 2021-03-03 DIAGNOSIS — I679 Cerebrovascular disease, unspecified: Secondary | ICD-10-CM | POA: Diagnosis not present

## 2021-03-03 DIAGNOSIS — I10 Essential (primary) hypertension: Secondary | ICD-10-CM | POA: Diagnosis not present

## 2021-03-03 DIAGNOSIS — R609 Edema, unspecified: Secondary | ICD-10-CM | POA: Diagnosis not present

## 2021-03-03 DIAGNOSIS — I340 Nonrheumatic mitral (valve) insufficiency: Secondary | ICD-10-CM | POA: Diagnosis not present

## 2021-03-03 DIAGNOSIS — I471 Supraventricular tachycardia: Secondary | ICD-10-CM | POA: Diagnosis not present

## 2021-03-03 DIAGNOSIS — I4891 Unspecified atrial fibrillation: Secondary | ICD-10-CM | POA: Diagnosis not present

## 2021-03-03 DIAGNOSIS — R0602 Shortness of breath: Secondary | ICD-10-CM | POA: Diagnosis not present

## 2021-03-03 DIAGNOSIS — N1831 Chronic kidney disease, stage 3a: Secondary | ICD-10-CM | POA: Diagnosis not present

## 2021-03-03 DIAGNOSIS — I129 Hypertensive chronic kidney disease with stage 1 through stage 4 chronic kidney disease, or unspecified chronic kidney disease: Secondary | ICD-10-CM | POA: Diagnosis not present

## 2021-03-03 NOTE — Telephone Encounter (Signed)
Spoke to [place]. She will let pt know. Also, we are working on rescheduling her appt for today.

## 2021-03-04 ENCOUNTER — Telehealth: Payer: Self-pay

## 2021-03-04 ENCOUNTER — Telehealth: Payer: Self-pay | Admitting: *Deleted

## 2021-03-04 ENCOUNTER — Encounter: Payer: Medicare Other | Admitting: Internal Medicine

## 2021-03-04 NOTE — Telephone Encounter (Signed)
Spoke to pt's son. I advised him that Dr Silvio Pate would be out of the office the rest of this week and as scheduled next week. He looks at his messages once a day and has done that for today. He said to make sure I put in the note that he is disappointed that Dr Silvio Pate did not return his call yesterday as a courtesy from 1 doctor to another. I told him I apologize but that Dr Silvio Pate is out for a reason and I will not disclose that to him. I will leave the note in his box and he will get back with him when he can. I tried to help him with the issue but all he would say is that it was about her medications.

## 2021-03-04 NOTE — Telephone Encounter (Signed)
Patient's son Dr. Georgana Curio left a message today stating that he would like to talk with Dr. Silvio Pate about his mom's ongoing care.

## 2021-03-04 NOTE — Telephone Encounter (Signed)
[facility] OT with [facility] left v/m requesting verbal orders for [facility] OT 1 x a wk for 3 wks. Sending note to Dr Silvio Pate.

## 2021-03-05 DIAGNOSIS — N1831 Chronic kidney disease, stage 3a: Secondary | ICD-10-CM | POA: Diagnosis not present

## 2021-03-05 DIAGNOSIS — I679 Cerebrovascular disease, unspecified: Secondary | ICD-10-CM | POA: Diagnosis not present

## 2021-03-05 DIAGNOSIS — D631 Anemia in chronic kidney disease: Secondary | ICD-10-CM | POA: Diagnosis not present

## 2021-03-05 DIAGNOSIS — I129 Hypertensive chronic kidney disease with stage 1 through stage 4 chronic kidney disease, or unspecified chronic kidney disease: Secondary | ICD-10-CM | POA: Diagnosis not present

## 2021-03-05 DIAGNOSIS — I480 Paroxysmal atrial fibrillation: Secondary | ICD-10-CM | POA: Diagnosis not present

## 2021-03-05 DIAGNOSIS — I471 Supraventricular tachycardia: Secondary | ICD-10-CM | POA: Diagnosis not present

## 2021-03-05 NOTE — Telephone Encounter (Signed)
Left verbal orders on verified VM.

## 2021-03-05 NOTE — Telephone Encounter (Signed)
Palliative Care Referral faxed to [facility]

## 2021-03-06 ENCOUNTER — Telehealth: Payer: Self-pay

## 2021-03-06 NOTE — Telephone Encounter (Signed)
Spoke with patient's son Mikki Santee regarding scheduling a Palliative care consult. He requested I call the patient directly to schedule. Spoke with the patient. She states she will call back after discussing PC with her son.

## 2021-03-06 NOTE — Telephone Encounter (Signed)
Spoke with patient and scheduled an in-person Palliative Consult for 03/10/21 @ 3PM.   COVID screening was negative. No pets in home. Patient lives alone in [place].  Consent obtained; updated Outlook/Netsmart/Team List and Epic.   Family is aware they may be receiving a call from provider the day before or day of to confirm appointment.

## 2021-03-09 DIAGNOSIS — I679 Cerebrovascular disease, unspecified: Secondary | ICD-10-CM | POA: Diagnosis not present

## 2021-03-09 DIAGNOSIS — I471 Supraventricular tachycardia: Secondary | ICD-10-CM | POA: Diagnosis not present

## 2021-03-09 DIAGNOSIS — I129 Hypertensive chronic kidney disease with stage 1 through stage 4 chronic kidney disease, or unspecified chronic kidney disease: Secondary | ICD-10-CM | POA: Diagnosis not present

## 2021-03-09 DIAGNOSIS — N1831 Chronic kidney disease, stage 3a: Secondary | ICD-10-CM | POA: Diagnosis not present

## 2021-03-09 DIAGNOSIS — D631 Anemia in chronic kidney disease: Secondary | ICD-10-CM | POA: Diagnosis not present

## 2021-03-09 DIAGNOSIS — I480 Paroxysmal atrial fibrillation: Secondary | ICD-10-CM | POA: Diagnosis not present

## 2021-03-10 ENCOUNTER — Non-Acute Institutional Stay: Payer: Self-pay | Admitting: Student

## 2021-03-10 ENCOUNTER — Other Ambulatory Visit: Payer: Self-pay

## 2021-03-10 DIAGNOSIS — N1831 Chronic kidney disease, stage 3a: Secondary | ICD-10-CM | POA: Diagnosis not present

## 2021-03-10 DIAGNOSIS — K559 Vascular disorder of intestine, unspecified: Secondary | ICD-10-CM | POA: Diagnosis not present

## 2021-03-10 DIAGNOSIS — R197 Diarrhea, unspecified: Secondary | ICD-10-CM

## 2021-03-10 DIAGNOSIS — D631 Anemia in chronic kidney disease: Secondary | ICD-10-CM | POA: Diagnosis not present

## 2021-03-10 DIAGNOSIS — I679 Cerebrovascular disease, unspecified: Secondary | ICD-10-CM | POA: Diagnosis not present

## 2021-03-10 DIAGNOSIS — I480 Paroxysmal atrial fibrillation: Secondary | ICD-10-CM | POA: Diagnosis not present

## 2021-03-10 DIAGNOSIS — I129 Hypertensive chronic kidney disease with stage 1 through stage 4 chronic kidney disease, or unspecified chronic kidney disease: Secondary | ICD-10-CM | POA: Diagnosis not present

## 2021-03-10 DIAGNOSIS — R531 Weakness: Secondary | ICD-10-CM

## 2021-03-10 DIAGNOSIS — Z515 Encounter for palliative care: Secondary | ICD-10-CM

## 2021-03-10 DIAGNOSIS — I471 Supraventricular tachycardia: Secondary | ICD-10-CM | POA: Diagnosis not present

## 2021-03-10 NOTE — Progress Notes (Signed)
[facility] [facility] Palliative Care Consult Note Telephone: [phone]  Fax: [phone]   Date of encounter: 03/10/21 PATIENT NAME: Jamie Hodges [address] [place] [address]   [phone] (home)  DOB: 1927/09/21 MRN: [id number] PRIMARY CARE PROVIDER:    [name], MD,  [place] [address] [phone]  REFERRING PROVIDER:   [name], MD [address] [place],  [place] [address] [phone]  RESPONSIBLE PARTY:    Contact Information     Name Relation Home Work [place] Hodges   [phone]   Jamie, [place]   [phone]   Hodges,Jamie Relative   [phone]        I met face to face with patient in the home. Hodges, Dr. Janne Hodges is present via telephone Palliative Care was asked to follow this patient by consultation request of  Jamie Carbon, MD to address advance care planning and complex medical decision making. This is the initial visit.                                     ASSESSMENT AND PLAN / RECOMMENDATIONS:   Advance Care Planning/Goals of Care: Goals include to maximize quality of life and symptom management. Patient/health care surrogate gave his/her permission to discuss.Our advance care planning conversation included a discussion about:    The value and importance of advance care planning  Experiences with loved ones who have been seriously ill or have died  Exploration of personal, cultural or spiritual beliefs that might influence medical decisions  Exploration of goals of care in the event of a sudden injury or illness  Identification and preparation of a healthcare agent, Hodges has HCPOA.  MOST form completed today; uploaded to [facility]. Decision not to resuscitate or to de-escalate disease focused treatments due to poor prognosis. CODE STATUS: DNR  Education provided on Palliative Medicine vs hospice services. Patient would like to maintain independence, complete  therapy as directed. We discussed plan/support from [facility] should patient need additional support/care such as a respite stay as she wants to limit hospitalizations. NP to follow up with SW at [facility]. We also discussed comfort medications that she may need in the future for pain, dyspnea such as morphine. Palliative Medicine will continue to provide support.   Symptom Management/Plan:  Paroxysmal atrial fibrillation-continue Amiodarone, Cardizem, metoprolol and Eliquis for anticoagulation as directed. Follow up with Cardiology as scheduled.   Generalized weakness-secondary to recent hospitalizations, CAD, cerebrovascular disease. Continue PT/OT as directed. Discussed contacting SW for resources available on [employer] for residents.   Acute colonic ischemia-this appears to be resolving. Patient not exhibiting any symptoms. Continue Eliquis for anticoagulation.  Diarrhea-patient reports having formed bowel movements since starting Questran. Continue Questran as directed.    Follow up Palliative Care Visit: Palliative care will continue to follow for complex medical decision making, advance care planning, and clarification of goals. Return in 4 weeks or prn.  I spent 60 minutes providing this consultation. More than 50% of the time in this consultation was spent in counseling and care coordination.  PPS: 50%  HOSPICE ELIGIBILITY/DIAGNOSIS: TBD  Chief Complaint: Palliative Medicine initial consult.  HISTORY OF PRESENT ILLNESS:  Jamie Hodges is a 87 y.o. year old female  with paroxysmal atrial fibrillation, SVT, CAD, essential hypertension, cerebrovascular disease, CKD stage 3a, anemia of chronic disease, ischemic bowel disease, acute  colonic ischemia, chronic diarrhea, vasovagal episode.   Recent hospitalization 7/14-7/16/22 due to SVT, paroxysmal atrial fibrillation. Hospitalization 7/5-7/11/22 due to ischemic bowel disease, colonic ischemia. Initial CT scan represented colonic  ischemia. Patient was started on antibiotics, heparin drip. She refused surgical intervention. Patient showed signs of improvement and follow up CT scan demonstrates improvement in ischemic colitis. Patient was discharged with Eliquis for anticoagulation.   Patient currently resides at [facility] home. She states she is improving; she is receiving PT/[facility] with [facility]. She is not driving at the present, but wishes to get back to this level of independence. She is having meals delivered, medication box being filled by nurse. She denies any pain, chest pain, abdominal discomfort, nausea, constipation or bleeding. She states she has started having regular, soft, formed bowel movements since starting the [place]. She does endorse weakness, occasional shortness of breath with exertion.  A 10 point review of systems is negative, except for the pertinent positives and negatives detailed in the HPI.  History obtained from review of EMR, discussion with primary team, and interview with family, facility staff/caregiver and/or Ms. Lerman.  I reviewed available labs, medications, imaging, studies and related documents from the EMR.  Records reviewed and summarized above.    Physical Exam: Pulse 76, resp 16, b/p 156/62, sats 99% on room air.  Constitutional: NAD General: frail appearing, well groomed, non-ill appearing EYES: anicteric sclera, lids intact, no discharge  ENMT: intact hearing, oral mucous membranes moist CV: S1S2, RRR, trace edema Pulmonary: LCTA, no increased work of breathing, no cough, room air Abdomen: normo-active BS + 4 quadrants, soft and non tender GU: deferred MSK: moves all extremities, ambulatory Skin: warm and dry, no rashes or wounds on visible skin Neuro:  generalized weakness, A & O x 3 Psych: non-anxious affect, pleasant Hem/lymph/immuno: no widespread bruising CURRENT PROBLEM LIST:  Patient Active Problem List   Diagnosis Date Noted   SVT (supraventricular  tachycardia) (HCC) 02/26/2021   Elevated troponin 02/26/2021   Hypokalemia 02/26/2021   CKD (chronic kidney disease), stage III ([place]) 02/26/2021   Syncope, vasovagal    Anemia of chronic disease    Weakness    Ischemic bowel disease ([place]) 02/18/2021   Colonic ischemia ([place]) 02/18/2021   Diarrhea    Acute kidney injury superimposed on CKD ([place])    Lactic acidosis    Right hip pain 03/17/2020   Wheezing 12/06/2019   Aortic atherosclerosis ([place]) 07/20/2019   Chronic renal disease, stage III ([place]) 02/23/2019   Arachnoid cyst 06/29/2018   Cerebrovascular disease 06/29/2018   Vertigo 06/23/2018   Preventative health care 02/17/2018   IBS (irritable bowel syndrome) 05/26/2016   Counseling regarding advanced directives 02/27/2014   Benign paroxysmal positional vertigo 05/11/2013   Chronic diarrhea 06/09/2011   Essential hypertension 09/16/2009   AF (paroxysmal atrial fibrillation) ([place]) 09/16/2009   DIVERTICULOSIS, COLON 09/16/2009   PAST MEDICAL HISTORY:  Active Ambulatory Problems    Diagnosis Date Noted   Essential hypertension 09/16/2009   AF (paroxysmal atrial fibrillation) ([place]) 09/16/2009   DIVERTICULOSIS, COLON 09/16/2009   Chronic diarrhea 06/09/2011   Benign paroxysmal positional vertigo 05/11/2013   Counseling regarding advanced directives 02/27/2014   IBS (irritable bowel syndrome) 05/26/2016   Preventative health care 02/17/2018   Vertigo 06/23/2018   Arachnoid cyst 06/29/2018   Cerebrovascular disease 06/29/2018   Chronic renal disease, stage III ([place]) 02/23/2019   Aortic atherosclerosis ([place]) 07/20/2019   Wheezing 12/06/2019   Right hip pain 03/17/2020   Ischemic bowel  disease ([place]) 02/18/2021   Colonic ischemia ([place]) 02/18/2021   Diarrhea    Acute kidney injury superimposed on CKD (HCC)    Lactic acidosis    Weakness    Syncope, vasovagal    Anemia of chronic disease    SVT (supraventricular tachycardia) (HCC) 02/26/2021   Elevated troponin 02/26/2021    Hypokalemia 02/26/2021   CKD (chronic kidney disease), stage III ([place]) 02/26/2021   Resolved Ambulatory Problems    Diagnosis Date Noted   Hyperlipemia 09/16/2009   Acute upper respiratory infection 03/26/2010   Allergic reaction 10/24/2016   Leg cramps 03/02/2017   Right flank pain 03/09/2018   Rib pain on right side 03/09/2018   Right-sided chest wall pain 03/09/2018   Paronychia of great toe, right 07/02/2019   Accelerated hypertension 07/20/2019   Rib contusion 07/20/2019   Past Medical History:  Diagnosis Date   Atrial fibrillation ([place]) 2010   C. difficile colitis    CAD (coronary artery disease) 2010   Hx of colonic polyps    Hyperlipidemia    Hypertension    SOCIAL HX:  Social History   Tobacco Use   Smoking status: Former    Types: Cigarettes    Quit date: 08/16/1978    Years since quitting: 42.5   Smokeless tobacco: Never  Substance Use Topics   Alcohol use: Never   FAMILY HX:  Family History  Problem Relation Age of Onset   COPD Mother    Coronary artery disease Neg Hx    Diabetes Neg Hx    Cancer Neg Hx        breast or colon cancer      ALLERGIES:  Allergies  Allergen Reactions   Codeine Sulfate     REACTION: rash/ welps   Other     Wild rice   Crestor [Rosuvastatin Calcium]     myalgia   Penicillins Hives and Rash    Has patient had a PCN reaction causing immediate rash, facial/tongue/throat swelling, SOB or lightheadedness with hypotension: Yes Has patient had a PCN reaction causing severe rash involving mucus membranes or skin necrosis: No Has patient had a PCN reaction that required hospitalization: No Has patient had a PCN reaction occurring within the last 10 years: Yes If all of the above answers are "NO", then may proceed with Cephalosporin use.     PERTINENT MEDICATIONS:  Outpatient Encounter Medications as of 03/10/2021  Medication Sig   acetaminophen (TYLENOL) 325 MG tablet Take 650 mg by mouth every 6 (six) hours as needed.    albuterol (VENTOLIN HFA) 108 (90 Base) MCG/ACT inhaler Inhale 2 puffs into the lungs every 6 (six) hours as needed for wheezing or shortness of breath.   amiodarone (PACERONE) 400 MG tablet Take 1 tablet (400 mg total) by mouth 2 (two) times daily.   apixaban (ELIQUIS) 5 MG TABS tablet Take 1 tablet (5 mg total) by mouth 2 (two) times daily.   cholestyramine (QUESTRAN) 4 g packet Take 1 packet (4 g total) by mouth 2 (two) times daily.   diltiazem (CARDIZEM CD) 360 MG 24 hr capsule Take 1 capsule (360 mg total) by mouth daily.   EPINEPHrine 0.3 mg/0.3 mL IJ SOAJ injection Inject 0.3 mg into the muscle as needed (for allergic reaction).   loperamide (IMODIUM) 2 MG capsule Take 2 mg by mouth 3 (three) times daily as needed for diarrhea or loose stools.   meclizine (ANTIVERT) 12.5 MG tablet Take 1 tablet (12.5 mg total) by mouth 3 (three)  times daily as needed for dizziness or nausea.   metoprolol tartrate (LOPRESSOR) 50 MG tablet Take 1 tablet (50 mg total) by mouth 2 (two) times daily.   promethazine (PHENERGAN) 12.5 MG tablet Take 1 tablet (12.5 mg total) by mouth every 8 (eight) hours as needed for nausea or vomiting.   No facility-administered encounter medications on file as of 03/10/2021.   Thank you for the opportunity to participate in the care of Ms. Burgo.  The palliative care team will continue to follow. Please call our office at [phone] if we can be of additional assistance.   Ezekiel Slocumb, NP   COVID-19 PATIENT SCREENING TOOL Asked and negative response unless otherwise noted:  Have you had symptoms of covid, tested positive or been in contact with someone with symptoms/positive test in the past 5-10 days? No

## 2021-03-12 DIAGNOSIS — N1831 Chronic kidney disease, stage 3a: Secondary | ICD-10-CM | POA: Diagnosis not present

## 2021-03-12 DIAGNOSIS — I471 Supraventricular tachycardia: Secondary | ICD-10-CM | POA: Diagnosis not present

## 2021-03-12 DIAGNOSIS — I480 Paroxysmal atrial fibrillation: Secondary | ICD-10-CM | POA: Diagnosis not present

## 2021-03-12 DIAGNOSIS — I129 Hypertensive chronic kidney disease with stage 1 through stage 4 chronic kidney disease, or unspecified chronic kidney disease: Secondary | ICD-10-CM | POA: Diagnosis not present

## 2021-03-12 DIAGNOSIS — D631 Anemia in chronic kidney disease: Secondary | ICD-10-CM | POA: Diagnosis not present

## 2021-03-12 DIAGNOSIS — I679 Cerebrovascular disease, unspecified: Secondary | ICD-10-CM | POA: Diagnosis not present

## 2021-03-16 DIAGNOSIS — N1831 Chronic kidney disease, stage 3a: Secondary | ICD-10-CM | POA: Diagnosis not present

## 2021-03-16 DIAGNOSIS — D631 Anemia in chronic kidney disease: Secondary | ICD-10-CM | POA: Diagnosis not present

## 2021-03-16 DIAGNOSIS — I679 Cerebrovascular disease, unspecified: Secondary | ICD-10-CM | POA: Diagnosis not present

## 2021-03-16 DIAGNOSIS — I480 Paroxysmal atrial fibrillation: Secondary | ICD-10-CM | POA: Diagnosis not present

## 2021-03-16 DIAGNOSIS — I471 Supraventricular tachycardia: Secondary | ICD-10-CM | POA: Diagnosis not present

## 2021-03-16 DIAGNOSIS — I129 Hypertensive chronic kidney disease with stage 1 through stage 4 chronic kidney disease, or unspecified chronic kidney disease: Secondary | ICD-10-CM | POA: Diagnosis not present

## 2021-03-18 ENCOUNTER — Other Ambulatory Visit: Payer: Self-pay | Admitting: Internal Medicine

## 2021-03-18 ENCOUNTER — Telehealth: Payer: Self-pay | Admitting: *Deleted

## 2021-03-18 DIAGNOSIS — I480 Paroxysmal atrial fibrillation: Secondary | ICD-10-CM | POA: Diagnosis not present

## 2021-03-18 DIAGNOSIS — I129 Hypertensive chronic kidney disease with stage 1 through stage 4 chronic kidney disease, or unspecified chronic kidney disease: Secondary | ICD-10-CM | POA: Diagnosis not present

## 2021-03-18 DIAGNOSIS — N1831 Chronic kidney disease, stage 3a: Secondary | ICD-10-CM | POA: Diagnosis not present

## 2021-03-18 DIAGNOSIS — I679 Cerebrovascular disease, unspecified: Secondary | ICD-10-CM | POA: Diagnosis not present

## 2021-03-18 DIAGNOSIS — D631 Anemia in chronic kidney disease: Secondary | ICD-10-CM | POA: Diagnosis not present

## 2021-03-18 DIAGNOSIS — I471 Supraventricular tachycardia: Secondary | ICD-10-CM | POA: Diagnosis not present

## 2021-03-18 NOTE — Telephone Encounter (Signed)
Crystal with [facility] called stating that they package the patient's medications and send them out to her.Crystal stated that she reached out to the patient and was advised that she had been in the hospital and was taken off of some of her medications. Crystal stated that they need to know what medications she has been taken off of and an update on her medications.Crystal stated that the patient does not know.

## 2021-03-18 NOTE — Telephone Encounter (Signed)
[facility] but was on hold too long. Will call back tomorrow.

## 2021-03-25 DIAGNOSIS — I129 Hypertensive chronic kidney disease with stage 1 through stage 4 chronic kidney disease, or unspecified chronic kidney disease: Secondary | ICD-10-CM | POA: Diagnosis not present

## 2021-03-25 DIAGNOSIS — I480 Paroxysmal atrial fibrillation: Secondary | ICD-10-CM | POA: Diagnosis not present

## 2021-03-25 DIAGNOSIS — I679 Cerebrovascular disease, unspecified: Secondary | ICD-10-CM | POA: Diagnosis not present

## 2021-03-25 DIAGNOSIS — D631 Anemia in chronic kidney disease: Secondary | ICD-10-CM | POA: Diagnosis not present

## 2021-03-25 DIAGNOSIS — I471 Supraventricular tachycardia: Secondary | ICD-10-CM | POA: Diagnosis not present

## 2021-03-25 DIAGNOSIS — N1831 Chronic kidney disease, stage 3a: Secondary | ICD-10-CM | POA: Diagnosis not present

## 2021-03-25 MED ORDER — APIXABAN 5 MG PO TABS: 5.0000 mg | ORAL_TABLET | Freq: Two times a day (BID) | ORAL | 11 refills | Status: DC

## 2021-03-25 MED ORDER — METOPROLOL TARTRATE 50 MG PO TABS: 50.0000 mg | ORAL_TABLET | Freq: Two times a day (BID) | ORAL | 11 refills | Status: DC

## 2021-03-25 MED ORDER — AMIODARONE HCL 200 MG PO TABS: 200.0000 mg | ORAL_TABLET | Freq: Every day | ORAL | 11 refills | Status: DC

## 2021-03-25 MED ORDER — DILTIAZEM HCL ER COATED BEADS 360 MG PO CP24: 360.0000 mg | ORAL_CAPSULE | Freq: Every day | ORAL | 11 refills | Status: DC

## 2021-03-25 NOTE — Telephone Encounter (Signed)
Received a fax from [facility] for her meds.

## 2021-03-25 NOTE — Addendum Note (Signed)
Addended by: Viviana Simpler I on: 03/25/2021 10:29 AM   Modules accepted: Orders

## 2021-03-25 NOTE — Telephone Encounter (Signed)
I built all the rxs to be sent except for Eliquis. Was not sure of her regimen.

## 2021-03-25 NOTE — Addendum Note (Signed)
Addended by: Pilar Grammes on: 03/25/2021 09:38 AM   Modules accepted: Orders

## 2021-03-30 ENCOUNTER — Other Ambulatory Visit: Payer: Self-pay

## 2021-03-30 ENCOUNTER — Ambulatory Visit (INDEPENDENT_AMBULATORY_CARE_PROVIDER_SITE_OTHER): Payer: Medicare Other | Admitting: Internal Medicine

## 2021-03-30 ENCOUNTER — Encounter: Payer: Self-pay | Admitting: Internal Medicine

## 2021-03-30 VITALS — BP 138/72 | HR 81 | Temp 97.7°F | Ht 66.0 in | Wt 150.0 lb

## 2021-03-30 DIAGNOSIS — I10 Essential (primary) hypertension: Secondary | ICD-10-CM

## 2021-03-30 DIAGNOSIS — I480 Paroxysmal atrial fibrillation: Secondary | ICD-10-CM

## 2021-03-30 DIAGNOSIS — N1831 Chronic kidney disease, stage 3a: Secondary | ICD-10-CM | POA: Diagnosis not present

## 2021-03-30 DIAGNOSIS — K559 Vascular disorder of intestine, unspecified: Secondary | ICD-10-CM | POA: Diagnosis not present

## 2021-03-30 MED ORDER — CHOLESTYRAMINE 4 G PO PACK: 4.0000 g | PACK | Freq: Every day | ORAL | 11 refills | Status: DC

## 2021-03-30 NOTE — Assessment & Plan Note (Signed)
This seems to have resolved Later had diarrhea illness---with negative stool studies Will cut back questran to daily

## 2021-03-30 NOTE — Assessment & Plan Note (Signed)
Will recheck labs 

## 2021-03-30 NOTE — Assessment & Plan Note (Signed)
BP Readings from Last 3 Encounters:  03/30/21 138/72  02/28/21 (!) 161/78  02/23/21 (!) 173/75   Had wanted to allow permissive elevations due to the ischemic bowel--but then had uncontrolled atrial fib Now doing okay on the metoprolol and diltiazem

## 2021-03-30 NOTE — Progress Notes (Signed)
Subjective:    Patient ID: Jamie Hodges, female    DOB: 10-14-1927, 87 y.o.   MRN: [id number]  HPI Here for hospital follow up This visit occurred during the SARS-CoV-2 public health emergency.  Safety protocols were in place, including screening questions prior to the visit, additional usage of staff PPE, and extensive cleaning of exam room while observing appropriate contact time as indicated for disinfecting solutions.   Doing well  No recurrence of abdominal pain No diarrhea on the cholestyramine Appetite is good No N/V  No chest pain No palpitations  Some DOE with activity---just rests briefly Aides in her home are now down to 3 days a week  Last GFR 53  Current Outpatient Medications on File Prior to Visit  Medication Sig Dispense Refill   acetaminophen (TYLENOL) 325 MG tablet Take 650 mg by mouth every 6 (six) hours as needed.     amiodarone (PACERONE) 200 MG tablet Take 1 tablet (200 mg total) by mouth daily. 30 tablet 11   apixaban (ELIQUIS) 5 MG TABS tablet Take 1 tablet (5 mg total) by mouth 2 (two) times daily. 60 tablet 11   cholestyramine (QUESTRAN) 4 g packet Take 1 packet (4 g total) by mouth 2 (two) times daily. 60 each 0   diltiazem (CARDIZEM CD) 360 MG 24 hr capsule Take 1 capsule (360 mg total) by mouth daily. 30 capsule 11   EPINEPHrine 0.3 mg/0.3 mL IJ SOAJ injection Inject 0.3 mg into the muscle as needed (for allergic reaction).     metoprolol tartrate (LOPRESSOR) 50 MG tablet Take 1 tablet (50 mg total) by mouth 2 (two) times daily. 60 tablet 11   No current facility-administered medications on file prior to visit.    Allergies  Allergen Reactions   Codeine Sulfate     REACTION: rash/ welps   Other     Wild rice   Crestor [Rosuvastatin Calcium]     myalgia   Penicillins Hives and Rash    Has patient had a PCN reaction causing immediate rash, facial/tongue/throat swelling, SOB or lightheadedness with hypotension: Yes Has patient had a PCN reaction  causing severe rash involving mucus membranes or skin necrosis: No Has patient had a PCN reaction that required hospitalization: No Has patient had a PCN reaction occurring within the last 10 years: Yes If all of the above answers are "NO", then may proceed with Cephalosporin use.    Past Medical History:  Diagnosis Date   Atrial fibrillation ([place]) 2010   One time during hospital while sick with severe diarrhea   C. difficile colitis    3/10  Severe C. dif ---had brief atrial fib then. Cath shows some blockages but no intervention   CAD (coronary artery disease) 2010   minor blockages --no intervention indicated   Hx of colonic polyps    Hyperlipidemia    Hypertension     Past Surgical History:  Procedure Laterality Date   CHOLECYSTECTOMY  3/16   ERCP N/A 01/14/2015   Procedure: ENDOSCOPIC RETROGRADE CHOLANGIOPANCREATOGRAPHY (ERCP);  Surgeon: Lucilla Lame, MD;  Location: [facility] ENDOSCOPY;  Service: Endoscopy;  Laterality: N/A;   TONSILLECTOMY AND ADENOIDECTOMY      Family History  Problem Relation Age of Onset   COPD Mother    Coronary artery disease Neg Hx    Diabetes Neg Hx    Cancer Neg Hx        breast or colon cancer    Social History   Socioeconomic History   Marital  status: Widowed    Spouse name: Not on file   Number of children: 3   Years of education: Not on file   Highest education level: Not on file  Occupational History   Occupation: retired 1st grade teacher  Tobacco Use   Smoking status: Former    Types: Cigarettes    Quit date: 08/16/1978    Years since quitting: 42.6   Smokeless tobacco: Never  Substance and Sexual Activity   Alcohol use: Never   Drug use: No   Sexual activity: Not on file  Other Topics Concern   Not on file  Social History Narrative   Has living will.    Son Jamie Hodges (MD) to make health care decisions.--then son Laurey Arrow   Has DNR order in past and requests again--done   No tube feeds if cognitively unaware         Social  Determinants of Health   Financial Resource Strain: Not on file  Food Insecurity: Not on file  Transportation Needs: Not on file  Physical Activity: Not on file  Stress: Not on file  Social Connections: Not on file  Intimate Partner Violence: Not on file   Review of Systems Feels her feet are swelling some    Objective:   Physical Exam Constitutional:      Appearance: Normal appearance.  Cardiovascular:     Rate and Rhythm: Normal rate and regular rhythm.     Heart sounds: No murmur heard.   No gallop.     Comments: Feet warm without palpable pulses Pulmonary:     Breath sounds: Normal breath sounds. No wheezing or rales.  Abdominal:     Palpations: Abdomen is soft.     Tenderness: There is no abdominal tenderness.  Musculoskeletal:     Cervical back: Neck supple.     Comments: Trace ankle edema  Lymphadenopathy:     Cervical: No cervical adenopathy.  Skin:    Findings: No rash.  Neurological:     Mental Status: She is alert.  Psychiatric:        Mood and Affect: Mood normal.        Behavior: Behavior normal.           Assessment & Plan:

## 2021-03-30 NOTE — Assessment & Plan Note (Addendum)
Had rapid rate but now seems back in sinus on amiodarone, metoprolol and diltiazem Will check labs including thyroid Eliquis

## 2021-03-31 LAB — HEPATIC FUNCTION PANEL
ALT: 12 U/L (ref 0–35)
AST: 14 U/L (ref 0–37)
Albumin: 4.1 g/dL (ref 3.5–5.2)
Alkaline Phosphatase: 78 U/L (ref 39–117)
Bilirubin, Direct: 0.1 mg/dL (ref 0.0–0.3)
Total Bilirubin: 0.8 mg/dL (ref 0.2–1.2)
Total Protein: 6.4 g/dL (ref 6.0–8.3)

## 2021-03-31 LAB — CBC
HCT: 34.8 % — ABNORMAL LOW (ref 36.0–46.0)
Hemoglobin: 11.8 g/dL — ABNORMAL LOW (ref 12.0–15.0)
MCHC: 33.8 g/dL (ref 30.0–36.0)
MCV: 96.9 fl (ref 78.0–100.0)
Platelets: 216 10*3/uL (ref 150.0–400.0)
RBC: 3.6 Mil/uL — ABNORMAL LOW (ref 3.87–5.11)
RDW: 15.2 % (ref 11.5–15.5)
WBC: 5.9 10*3/uL (ref 4.0–10.5)

## 2021-03-31 LAB — RENAL FUNCTION PANEL
Albumin: 4.1 g/dL (ref 3.5–5.2)
BUN: 22 mg/dL (ref 6–23)
CO2: 26 mEq/L (ref 19–32)
Calcium: 8.7 mg/dL (ref 8.4–10.5)
Chloride: 108 mEq/L (ref 96–112)
Creatinine, Ser: 1.23 mg/dL — ABNORMAL HIGH (ref 0.40–1.20)
GFR: 38.45 mL/min — ABNORMAL LOW (ref >60.00)
Glucose, Bld: 120 mg/dL — ABNORMAL HIGH (ref 70–99)
Phosphorus: 4 mg/dL (ref 2.3–4.6)
Potassium: 4.7 mEq/L (ref 3.5–5.1)
Sodium: 142 mEq/L (ref 135–145)

## 2021-03-31 LAB — T4, FREE: Free T4: 0.77 ng/dL (ref 0.60–1.60)

## 2021-03-31 LAB — TSH: TSH: 5.16 u[IU]/mL (ref 0.35–5.50)

## 2021-03-31 NOTE — Telephone Encounter (Signed)
Crystal with [facility] left v/m with FU cb about what meds pt is to be taking before shipping pts meds. Crystal request cb.

## 2021-03-31 NOTE — Telephone Encounter (Signed)
I called [facility] to make sure [facility] [place] was not a service provider they use for packaging meds. They do not. Pt was here yesterday and said she uses [facility]. So I will leave it as is for now.

## 2021-04-06 DIAGNOSIS — K55039 Acute (reversible) ischemia of large intestine, extent unspecified: Secondary | ICD-10-CM | POA: Diagnosis not present

## 2021-04-06 DIAGNOSIS — E785 Hyperlipidemia, unspecified: Secondary | ICD-10-CM | POA: Diagnosis not present

## 2021-04-06 DIAGNOSIS — Z86010 Personal history of colonic polyps: Secondary | ICD-10-CM

## 2021-04-06 DIAGNOSIS — I480 Paroxysmal atrial fibrillation: Secondary | ICD-10-CM | POA: Diagnosis not present

## 2021-04-06 DIAGNOSIS — I471 Supraventricular tachycardia: Secondary | ICD-10-CM | POA: Diagnosis not present

## 2021-04-06 DIAGNOSIS — N1831 Chronic kidney disease, stage 3a: Secondary | ICD-10-CM | POA: Diagnosis not present

## 2021-04-06 DIAGNOSIS — Z7901 Long term (current) use of anticoagulants: Secondary | ICD-10-CM

## 2021-04-06 DIAGNOSIS — Z9049 Acquired absence of other specified parts of digestive tract: Secondary | ICD-10-CM

## 2021-04-06 DIAGNOSIS — E875 Hyperkalemia: Secondary | ICD-10-CM | POA: Diagnosis not present

## 2021-04-06 DIAGNOSIS — Z87891 Personal history of nicotine dependence: Secondary | ICD-10-CM

## 2021-04-06 DIAGNOSIS — D631 Anemia in chronic kidney disease: Secondary | ICD-10-CM | POA: Diagnosis not present

## 2021-04-06 DIAGNOSIS — I679 Cerebrovascular disease, unspecified: Secondary | ICD-10-CM | POA: Diagnosis not present

## 2021-04-06 DIAGNOSIS — K5730 Diverticulosis of large intestine without perforation or abscess without bleeding: Secondary | ICD-10-CM | POA: Diagnosis not present

## 2021-04-06 DIAGNOSIS — Z8619 Personal history of other infectious and parasitic diseases: Secondary | ICD-10-CM

## 2021-04-06 DIAGNOSIS — I129 Hypertensive chronic kidney disease with stage 1 through stage 4 chronic kidney disease, or unspecified chronic kidney disease: Secondary | ICD-10-CM | POA: Diagnosis not present

## 2021-04-06 DIAGNOSIS — R197 Diarrhea, unspecified: Secondary | ICD-10-CM | POA: Diagnosis not present

## 2021-04-06 DIAGNOSIS — Z9181 History of falling: Secondary | ICD-10-CM

## 2021-04-06 DIAGNOSIS — I2510 Atherosclerotic heart disease of native coronary artery without angina pectoris: Secondary | ICD-10-CM | POA: Diagnosis not present

## 2021-04-07 ENCOUNTER — Telehealth: Payer: Self-pay

## 2021-04-07 NOTE — Telephone Encounter (Signed)
Spoke to pt. She has been taking extra cholestyramine. She will try the 1 imodium daily and update [place] on Thursday.

## 2021-04-07 NOTE — Telephone Encounter (Signed)
Pt said starting on 03/31/21 with watery diarrhea and pt has no control over diarrhea. No abd pain, no fever or chills, no dry mouth and no dizziness. No covid symptoms;Pt has been drinking fluids. Pt has been taking cholestyramine packets taking 2 daily with no relief of diarrhea. Pt said Dr Silvio Pate is aware of what is going on and request cb with what to do or take. Pt last seen on 12/28/20 for HFU.Pt said usually happens after dinner but today had diarrhea after breakfast. [facility] [address]sending note to Dr Silvio Pate and Larene Beach CMA.

## 2021-04-09 ENCOUNTER — Telehealth: Payer: Self-pay

## 2021-04-09 ENCOUNTER — Telehealth: Payer: Self-pay | Admitting: Student

## 2021-04-09 NOTE — Telephone Encounter (Signed)
Palliative NP called to confirm appointment scheduled for tomorrow at 3pm. Patient states she has a conflict and would like to reschedule appointment. She denies any needs at this time. She is agreeable to a phone call in 4 weeks to follow up. She is encouraged to call should any needs arise.

## 2021-04-09 NOTE — Telephone Encounter (Signed)
Spoke to pt. She has been taking 2 doses of Cholestyramine and 1 Imodium since the call 2 days ago. I advised her to stop the Imodium and will get with Dr Silvio Pate about what to do with the cholestyramine.

## 2021-04-09 NOTE — Telephone Encounter (Signed)
Patient wanted to update Dr.Letvak on Tuesday she had diarrhea, Wednesday and Thursday nothing. Wanted to know if she should continue with procedure or take a laxative.

## 2021-04-09 NOTE — Telephone Encounter (Signed)
Spoke to pt. She was asking about a laxative. I told her being off of the cholestyramine should be enough to get her moving again soon.

## 2021-04-17 ENCOUNTER — Other Ambulatory Visit: Payer: Self-pay

## 2021-04-17 ENCOUNTER — Ambulatory Visit (INDEPENDENT_AMBULATORY_CARE_PROVIDER_SITE_OTHER): Payer: Medicare Other | Admitting: Cardiovascular Disease

## 2021-04-17 ENCOUNTER — Encounter: Payer: Self-pay | Admitting: Cardiovascular Disease

## 2021-04-17 VITALS — BP 170/68 | HR 73 | Ht 67.0 in | Wt 149.0 lb

## 2021-04-17 DIAGNOSIS — I480 Paroxysmal atrial fibrillation: Secondary | ICD-10-CM

## 2021-04-17 DIAGNOSIS — I10 Essential (primary) hypertension: Secondary | ICD-10-CM

## 2021-04-17 DIAGNOSIS — I700 Atherosclerosis of aorta: Secondary | ICD-10-CM

## 2021-04-17 DIAGNOSIS — K559 Vascular disorder of intestine, unspecified: Secondary | ICD-10-CM

## 2021-04-17 DIAGNOSIS — I679 Cerebrovascular disease, unspecified: Secondary | ICD-10-CM

## 2021-04-17 MED ORDER — AMIODARONE HCL 200 MG PO TABS: ORAL_TABLET | ORAL | 11 refills | Status: DC

## 2021-04-17 MED ORDER — METOPROLOL TARTRATE 50 MG PO TABS: ORAL_TABLET | ORAL | 11 refills | Status: DC

## 2021-04-17 NOTE — Progress Notes (Signed)
Cardiology Office Note  Date:  04/17/2021   ID:  Jamie Hodges, DOB 12-Jan-1928, MRN [id number]  PCP:  Jamie Carbon, MD   Chief Complaint  Patient presents with   New Patient (Initial Visit)    Ref by Dr. Silvio Pate to establish care for A-Fib. Medications reviewed by the patient verbally. Patient c/o shortness of breath.     HPI:  [name] is a 87 year old woman with past medical history of Paroxysmal atrial fibrillation Seen in the hospital 02/2021:  abdominal pain.  Initial CT scan concerning for colonic ischemia.  Treated with empiric antibiotics with Rocephin and Flagyl.   In the hospital for atrial fibrillation with RVR July 2022 Who presents to establish care in the [facility] office, evaluation of her paroxysmal atrial fibrillation  Recent hospitalization reviewed  02/25/2021 in the evening her heart rate has been in the 140s  remained so throughout the night.    emergency department patient received Cardizem gtt  drip  Rate control improved after addition of IV digoxin.    Initiated oral regimen of amiodarone, metoprolol, Cardizem.  Cardizem drip weaned off.  Patient stable otherwise. Patient monitored for 24 hours after transition off diltiazem gtt..   Does not check pressure at home  Echocardiogram July 2022  1. Left ventricular ejection fraction, by estimation, is 60 to 65%. The  left ventricle has normal function. The left ventricle has no regional  wall motion abnormalities. There is moderate concentric left ventricular  hypertrophy. Left ventricular  diastolic parameters are consistent with Grade I diastolic dysfunction  (impaired relaxation).   2. Right ventricular systolic function is normal. The right ventricular  size is normal.   3. Left atrial size was mildly dilated.   4. Right atrial size was mildly dilated.   5. The mitral valve is normal in structure. Mild to moderate mitral valve  regurgitation.   EKG personally reviewed by myself on todays  visit Normal sinus rhythm rate 73 bpm no significant ST or T wave changes  PMH:   has a past medical history of Atrial fibrillation ([place]) (2010), C. difficile colitis, CAD (coronary artery disease) (2010), colonic polyps, Hyperlipidemia, and Hypertension.  PSH:    Past Surgical History:  Procedure Laterality Date   CHOLECYSTECTOMY  3/16   ERCP N/A 01/14/2015   Procedure: ENDOSCOPIC RETROGRADE CHOLANGIOPANCREATOGRAPHY (ERCP);  Surgeon: Lucilla Lame, MD;  Location: [facility] ENDOSCOPY;  Service: Endoscopy;  Laterality: N/A;   TONSILLECTOMY AND ADENOIDECTOMY      Current Outpatient Medications  Medication Sig Dispense Refill   acetaminophen (TYLENOL) 325 MG tablet Take 650 mg by mouth every 6 (six) hours as needed.     apixaban (ELIQUIS) 5 MG TABS tablet Take 1 tablet (5 mg total) by mouth 2 (two) times daily. 60 tablet 11   cholestyramine (QUESTRAN) 4 g packet Take 1 packet (4 g total) by mouth daily. 30 each 11   diltiazem (CARDIZEM CD) 360 MG 24 hr capsule Take 1 capsule (360 mg total) by mouth daily. 30 capsule 11   meclizine (ANTIVERT) 25 MG tablet Take 25 mg by mouth 3 (three) times daily as needed.     amiodarone (PACERONE) 200 MG tablet Take 1 tablet (200 mg) by mouth once daily, you may take 1 extra tablet (200 mg) once daily as needed for breakthrough atrial fibrillation 30 tablet 11   EPINEPHrine 0.3 mg/0.3 mL IJ SOAJ injection Inject 0.3 mg into the muscle as needed (for allergic reaction). (Patient not taking: Reported on 04/17/2021)  metoprolol tartrate (LOPRESSOR) 50 MG tablet Take 1 tablet (50 mg) by mouth twice daily, you may take 1 extra tablet (50 mg) once a day as needed for breakthrough atrial fibrillation 60 tablet 11   No current facility-administered medications for this visit.     Allergies:   Codeine sulfate, Other, Crestor [rosuvastatin calcium], and Penicillins   Social History:  The patient  reports that she quit smoking about 42 years ago. Her smoking use included  cigarettes. She has never used smokeless tobacco. She reports that she does not drink alcohol and does not use drugs.   Family History:   family history includes COPD in her mother.    Review of Systems: Review of Systems  Constitutional: Negative.   HENT: Negative.    Respiratory: Negative.    Cardiovascular: Negative.   Gastrointestinal: Negative.   Musculoskeletal: Negative.   Neurological: Negative.   Psychiatric/Behavioral: Negative.    All other systems reviewed and are negative.   PHYSICAL EXAM: VS:  BP (!) 170/68 (BP Location: Right Arm, Patient Position: Sitting, Cuff Size: Normal)   Pulse 73   Ht '5\' 7"'$  (1.702 m)   Wt 149 lb (67.6 kg)   SpO2 98%   BMI 23.34 kg/m  , BMI Body mass index is 23.34 kg/m. Constitutional:  oriented to person, place, and time. No distress.  HENT:  Head: Grossly normal Eyes:  no discharge. No scleral icterus.  Neck: No JVD, no carotid bruits  Cardiovascular: Regular rate and rhythm, no murmurs appreciated Pulmonary/Chest: Clear to auscultation bilaterally, no wheezes or rails Abdominal: Soft.  no distension.  no tenderness.  Musculoskeletal: Normal range of motion Neurological:  normal muscle tone. Coordination normal. No atrophy Skin: Skin warm and dry Psychiatric: normal affect, pleasant   Recent Labs: 02/26/2021: Magnesium 1.9 03/30/2021: ALT 12; BUN 22; Creatinine, Ser 1.23; Hemoglobin 11.8; Platelets 216.0; Potassium 4.7; Sodium 142; TSH 5.16    Lipid Panel Lab Results  Component Value Date   CHOL 185 06/24/2018   HDL 51 06/24/2018   LDLCALC 92 06/24/2018   TRIG 208 (H) 06/24/2018      Wt Readings from Last 3 Encounters:  04/17/21 149 lb (67.6 kg)  03/30/21 150 lb (68 kg)  02/26/21 155 lb 14.4 oz (70.7 kg)     ASSESSMENT AND PLAN:  Problem List Items Addressed This Visit       Cardiology Problems   Essential hypertension   Relevant Medications   amiodarone (PACERONE) 200 MG tablet   metoprolol tartrate  (LOPRESSOR) 50 MG tablet   Other Relevant Orders   EKG 12-Lead   AF (paroxysmal atrial fibrillation) (HCC) - Primary   Relevant Medications   amiodarone (PACERONE) 200 MG tablet   metoprolol tartrate (LOPRESSOR) 50 MG tablet   Other Relevant Orders   EKG 12-Lead   Ischemic bowel disease (HCC)   Relevant Medications   amiodarone (PACERONE) 200 MG tablet   metoprolol tartrate (LOPRESSOR) 50 MG tablet   Other Relevant Orders   EKG 12-Lead   Cerebrovascular disease   Relevant Medications   amiodarone (PACERONE) 200 MG tablet   metoprolol tartrate (LOPRESSOR) 50 MG tablet   Other Relevant Orders   EKG 12-Lead   Aortic atherosclerosis (HCC)   Relevant Medications   amiodarone (PACERONE) 200 MG tablet   metoprolol tartrate (LOPRESSOR) 50 MG tablet   Other Relevant Orders   EKG 12-Lead   Paroxysmal atrial fibrillation Recent episodes in the hospital Maintaining normal sinus rhythm since discharge Recommend she monitor blood  pressure at home and call us with some numbers to help manage medication dosing Will continue metoprolol tartrate 50 twice daily, diltiazem 360 mg daily, amiodarone 200 daily with Eliquis 5 mg twice daily -For breakthrough tachyarrhythmia concerning for atrial fibrillation recommend she take extra amiodarone/metoprolol -Long discussion with her son over the phone who is a physician out of state, Son also present with her today, Have detailed the fact that she does not want heroic measures, she is DNR/DNI -She would prefer not to ever require cardioversion and prefer out of hospital care  Aortic atherosclerosis Noted on CT abdomen pelvis Currently not on a statin, will discuss in follow-up  Essential hypertension Blood pressure elevated today but she reports is well controlled at home She has not been checking her blood pressure on a regular basis Was well controlled recently with primary care No medication changes made Recommend she call us with blood  pressure measurements   Total encounter time more than 60 minutes  Greater than 50% was spent in counseling and coordination of care with the patient    Signed, Esmond Plants, M.D., Ph.D. [place], [place]

## 2021-04-17 NOTE — Patient Instructions (Addendum)
Medication Instructions:  No changes  For breakthrough atrial fibrillation: Take extra amiodarone and extra metoprolol  Please check your blood pressure/ heart rates at home in the late morning. Record these readings and call [phone]/ send a MyChart message of these in 1-2 weeks for Dr. Rockey Situ to review.   If you need a refill on your cardiac medications before your next appointment, please call your pharmacy.    Lab work: No new labs needed   If you have labs (blood work) drawn today and your tests are completely normal, you will receive your results only by: [place] (if you have MyChart) OR A paper copy in the mail If you have any lab test that is abnormal or we need to change your treatment, we will call you to review the results.   Testing/Procedures: No new testing needed   Follow-Up: At [facility], you and your health needs are our priority.  As part of our continuing mission to provide you with exceptional heart care, we have created designated Provider Care Teams.  These Care Teams include your primary Cardiologist (physician) and Advanced Practice Providers (APPs -  Physician Assistants and Nurse Practitioners) who all work together to provide you with the care you need, when you need it.  You will need a follow up appointment in 6 months  Providers on your designated Care Team:   Murray Hodgkins, NP Christell Faith, PA-C Marrianne Mood, PA-C Cadence Kathlen Mody, [place]  Any Other Special Instructions Will Be Listed Below (If Applicable).  COVID-19 Vaccine Information can be found at: ShippingScam.co.uk For questions related to vaccine distribution or appointments, please email [email or web address] or call [phone].    How to Take Your Blood Pressure Blood pressure measures how strongly your blood is pressing against the walls of your arteries. Arteries are blood vessels that carry blood from  your heart throughout your body. You can take your blood pressure at home with a machine. You may need to check your blood pressure at home: To check if you have high blood pressure (hypertension). To check your blood pressure over time. To make sure your blood pressure medicine is working. Supplies needed: Blood pressure machine, or monitor. Dining room chair to sit in. Table or desk. Small notebook. Pencil or pen. How to prepare Avoid these things for 30 minutes before checking your blood pressure: Having drinks with caffeine in them, such as coffee or tea. Drinking alcohol. Eating. Smoking. Exercising. Do these things five minutes before checking your blood pressure: Go to the bathroom and pee (urinate). Sit in a dining chair. Do not sit on a soft couch or an armchair. Be quiet. Do not talk. How to take your blood pressure Follow the instructions that came with your machine. If you have a digital blood pressure monitor, these may be the instructions: Sit up straight. Place your feet on the floor. Do not cross your ankles or legs. Rest your left arm at the level of your heart. You may rest it on a table, desk, or chair. Pull up your shirt sleeve. Wrap the blood pressure cuff around the upper part of your left arm. The cuff should be 1 inch (2.5 cm) above your elbow. It is best to wrap the cuff around bare skin. Fit the cuff snugly around your arm. You should be able to place only one finger between the cuff and your arm. Place the cord so that it rests in the bend of your elbow. Press the power button. Sit quietly while  the cuff fills with air and loses air. Write down the numbers on the screen. Wait 2-3 minutes and then repeat steps 1-10. What do the numbers mean? Two numbers make up your blood pressure. The first number is called systolic pressure. The second is called diastolic pressure. An example of a blood pressure reading is "120 over 80" (or 120/80). If you are an adult  and do not have a medical condition, use this guide to find out if your blood pressure is normal: Normal First number: below 120. Second number: below 80. Elevated First number: 120-129. Second number: below 80. Hypertension stage 1 First number: 130-139. Second number: 80-89. Hypertension stage 2 First number: 140 or above. Second number: 57 or above. Your blood pressure is above normal even if only the first or only the second number is above normal. Follow these instructions at home: Medicines Take over-the-counter and prescription medicines only as told by your doctor. Tell your doctor if your medicine is causing side effects. General instructions Check your blood pressure as often as your doctor tells you to. Check your blood pressure at the same time every day. Take your monitor to your next doctor's appointment. Your doctor will: Make sure you are using it correctly. Make sure it is working right. Understand what your blood pressure numbers should be. Keep all follow-up visits as told by your doctor. This is important. General tips You will need a blood pressure machine, or monitor. Your doctor can suggest a monitor. You can buy one at a drugstore or online. When choosing one: Choose one with an arm cuff. Choose one that wraps around your upper arm. Only one finger should fit between your arm and the cuff. Do not choose one that measures your blood pressure from your wrist or finger. Where to find more information American Heart Association: www.heart.org Contact a doctor if: Your blood pressure keeps being high. Your blood pressure is suddenly low. Get help right away if: Your first blood pressure number is higher than 180. Your second blood pressure number is higher than 120. Summary Check your blood pressure at the same time every day. Avoid caffeine, alcohol, smoking, and exercise for 30 minutes before checking your blood pressure. Make sure you understand what  your blood pressure numbers should be. This information is not intended to replace advice given to you by your health care provider. Make sure you discuss any questions you have with your health care provider. Document Revised: 06/11/2020 Document Reviewed: 07/27/2019 Elsevier Patient Education  2022 [employer].

## 2021-04-25 ENCOUNTER — Other Ambulatory Visit: Payer: Self-pay | Admitting: Internal Medicine

## 2021-05-05 DIAGNOSIS — H6123 Impacted cerumen, bilateral: Secondary | ICD-10-CM | POA: Diagnosis not present

## 2021-05-05 DIAGNOSIS — H903 Sensorineural hearing loss, bilateral: Secondary | ICD-10-CM | POA: Diagnosis not present

## 2021-05-06 ENCOUNTER — Telehealth: Payer: Self-pay | Admitting: Cardiovascular Disease

## 2021-05-06 NOTE — Telephone Encounter (Signed)
Jamie Hodges calling back for status

## 2021-05-06 NOTE — Telephone Encounter (Signed)
Pt c/o BP issue: STAT if pt c/o blurred vision, one-sided weakness or slurred speech  1. What are your last 5 BP readings? 9/14 130/80, 9/19 180/80, 9/21 201/100  2. Are you having any other symptoms (ex. Dizziness, headache, blurred vision, passed out)? no  3. What is your BP issue? HR 68, medication concerns

## 2021-05-06 NOTE — Telephone Encounter (Signed)
Was able to reach back out to pt's nurse Janic advised pt still concern for her BP, current BP 171/89, HR 69. Pt is not having any symptoms. Not wanting to seek ED for HTN per nurse.   Spoke with DOD Dr. Saunders Revel, who advised, not emergent at this time as pt is not having an cardiac symptoms BP high, but not too concerning at this time, to adjust her meds. Dr. Saunders Revel advised nurse to have pt make an appt with PCP or Dr. Rockey Situ regarding BP concerns and needs for medication adjustments.   Jamie Hodges, very thankful for the call, she will call Jamie Hodges to inform her of Dr. Darnelle Bos recommendations. Will try to have scheduling call pt tomorrow to get her an appt. Advised appt may be a few weeks out unless there is an cancellation. If BP continue to be high and she starts to have any CP, SOB, weakness, or other symptoms then seek ED. Jamie Hodges will have pt to continue to monitor her BP.  Otherwise all questions or concerns were address and no additional concerns at this time. Agreeable to plan, will call back for anything further.  Message r/t to scheduling to have pt called for an appt

## 2021-05-07 ENCOUNTER — Telehealth: Payer: Self-pay

## 2021-05-07 ENCOUNTER — Other Ambulatory Visit: Payer: Self-pay

## 2021-05-07 ENCOUNTER — Telehealth: Payer: Self-pay | Admitting: Cardiovascular Disease

## 2021-05-07 ENCOUNTER — Emergency Department
Admission: EM | Admit: 2021-05-07 | Discharge: 2021-05-07 | Disposition: A | Discharge status: Home / Self Care | Payer: Medicare Other | Attending: Emergency Medicine | Admitting: Emergency Medicine

## 2021-05-07 DIAGNOSIS — I129 Hypertensive chronic kidney disease with stage 1 through stage 4 chronic kidney disease, or unspecified chronic kidney disease: Secondary | ICD-10-CM | POA: Diagnosis not present

## 2021-05-07 DIAGNOSIS — I2510 Atherosclerotic heart disease of native coronary artery without angina pectoris: Secondary | ICD-10-CM | POA: Insufficient documentation

## 2021-05-07 DIAGNOSIS — R030 Elevated blood-pressure reading, without diagnosis of hypertension: Secondary | ICD-10-CM | POA: Diagnosis present

## 2021-05-07 DIAGNOSIS — N1831 Chronic kidney disease, stage 3a: Secondary | ICD-10-CM | POA: Insufficient documentation

## 2021-05-07 DIAGNOSIS — R531 Weakness: Secondary | ICD-10-CM | POA: Diagnosis not present

## 2021-05-07 DIAGNOSIS — I4891 Unspecified atrial fibrillation: Secondary | ICD-10-CM | POA: Insufficient documentation

## 2021-05-07 DIAGNOSIS — Z87891 Personal history of nicotine dependence: Secondary | ICD-10-CM | POA: Diagnosis not present

## 2021-05-07 DIAGNOSIS — R5381 Other malaise: Secondary | ICD-10-CM | POA: Diagnosis not present

## 2021-05-07 DIAGNOSIS — Z7901 Long term (current) use of anticoagulants: Secondary | ICD-10-CM | POA: Insufficient documentation

## 2021-05-07 DIAGNOSIS — Z79899 Other long term (current) drug therapy: Secondary | ICD-10-CM | POA: Insufficient documentation

## 2021-05-07 DIAGNOSIS — I10 Essential (primary) hypertension: Secondary | ICD-10-CM

## 2021-05-07 MED ORDER — AMLODIPINE BESYLATE 10 MG PO TABS: 10.0000 mg | ORAL_TABLET | Freq: Every day | ORAL | 0 refills | Status: DC

## 2021-05-07 MED ORDER — METOPROLOL TARTRATE 50 MG PO TABS: 50.0000 mg | ORAL_TABLET | Freq: Every day | ORAL | 0 refills | Status: DC

## 2021-05-07 MED ORDER — AMIODARONE HCL 200 MG PO TABS: 200.0000 mg | ORAL_TABLET | Freq: Every day | ORAL | 0 refills | Status: DC

## 2021-05-07 NOTE — Telephone Encounter (Signed)
Is at triage now Will await the results of the ER evaluation

## 2021-05-07 NOTE — Telephone Encounter (Addendum)
According to access note pt agreed to go to ED. Also see phone note to Dr Rockey Situ today. Sending note to Dr Silvio Pate who is out of office. Dr Einar Pheasant who is in office, Larene [place] and [facility]. Will also teams [place] and [place].(Not sure if Larene Beach is at lunch). Per chart note review tab now pt is at [facility] ED.

## 2021-05-07 NOTE — Telephone Encounter (Signed)
Patient no longer wishes to schedule at this time Patient was seen in ED - BP is now ~140 and she was prescribed some medication to help Will call if needed

## 2021-05-07 NOTE — Telephone Encounter (Signed)
[place] is calling states patient's BP is  220/84. States she has a slight headache, denies dizziness, and stroke screen was negative. Pt c/o BP issue: STAT if pt c/o blurred vision, one-sided weakness or slurred speech  1. What are your last 5 BP readings?  Today 220/84 Yesterday 200/100  2. Are you having any other symptoms (ex. Dizziness, headache, blurred vision, passed out)? Slight headache, denies dizziness, stroke screen was negative.   3. What is your BP issue?elevated  PCP advised to go to the Emergency Department

## 2021-05-07 NOTE — ED Provider Notes (Signed)
[facility] Emergency Department Provider Note  ____________________________________________  Time seen: Approximately 2:39 PM  I have reviewed the triage vital signs and the nursing notes.   HISTORY  Chief Complaint Hypertension    HPI Jamie Hodges is a 87 y.o. female who presents the emergency department for evaluation of hypertension.  Patient has had elevated hypertension with a reading over 161 systolic yesterday.  Patient been trying to get in touch with both her cardiologist and her primary care for recommendations and had been referred to the emergency department for evaluation.  She has no symptoms or complaints to include headache, visual changes, chest pain, shortness of breath.  No recent illnesses.  No changes in her health.  She states that she is recently seen her cardiologist with a reassuring work-up.  This included labs.  At this time patient arrives with a blood pressure of 140/76.  Again asymptomatic at this time.  I had a conversation with the patient and her son who is a physician.  We discussed the patient's blood pressure readings, symptoms at this time.  Offered labs, chest x-ray for further evaluation.  However patient's blood pressure is 140/76 currently and she has no symptoms.  The son who again is a physician and the patient both did not want any imaging or labs.  They felt that this visit was unnecessary but they felt like the patient needed an as needed medicine in case her blood pressure started rising again.  Her blood pressure has been well controlled apically until the last 3 days.  Again there was no new symptoms for the last 3 days.  The son stated primarily that the primary care and her cardiologist would not "call her in something" prompting visit to the ED.  We discussed causes of hypertension as well as potential for prescribing an as needed medicine.  We decided to prescribe Norvasc to be taken as needed if her blood pressure starts  approaching the 096 systolic range.  The patient and her son are agreeable with this plan.       Past Medical History:  Diagnosis Date   Atrial fibrillation ([place]) 2010   One time during hospital while sick with severe diarrhea   C. difficile colitis    3/10  Severe C. dif ---had brief atrial fib then. Cath shows some blockages but no intervention   CAD (coronary artery disease) 2010   minor blockages --no intervention indicated   Hx of colonic polyps    Hyperlipidemia    Hypertension     Patient Active Problem List   Diagnosis Date Noted   SVT (supraventricular tachycardia) ([place]) 02/26/2021   Stage 3a chronic kidney disease ([place]) 02/26/2021   Anemia of chronic disease    Ischemic bowel disease ([place]) 02/18/2021   Diarrhea    Right hip pain 03/17/2020   Aortic atherosclerosis ([place]) 07/20/2019   Arachnoid cyst 06/29/2018   Cerebrovascular disease 06/29/2018   Vertigo 06/23/2018   Preventative health care 02/17/2018   IBS (irritable bowel syndrome) 05/26/2016   Counseling regarding advanced directives 02/27/2014   Benign paroxysmal positional vertigo 05/11/2013   Chronic diarrhea 06/09/2011   Essential hypertension 09/16/2009   AF (paroxysmal atrial fibrillation) ([place]) 09/16/2009   DIVERTICULOSIS, COLON 09/16/2009    Past Surgical History:  Procedure Laterality Date   CHOLECYSTECTOMY  3/16   ERCP N/A 01/14/2015   Procedure: ENDOSCOPIC RETROGRADE CHOLANGIOPANCREATOGRAPHY (ERCP);  Surgeon: Lucilla Lame, MD;  Location: [facility] ENDOSCOPY;  Service: Endoscopy;  Laterality: N/A;  TONSILLECTOMY AND ADENOIDECTOMY      Prior to Admission medications   Medication Sig Start Date End Date Taking? Authorizing Provider  amiodarone (PACERONE) 200 MG tablet Take 1 tablet (200 mg total) by mouth daily. 05/07/21  Yes Salvador Bigbee, Charline Bills, PA-C  amLODipine (NORVASC) 10 MG tablet Take 1 tablet (10 mg total) by mouth daily. 05/07/21 05/07/22 Yes Juliana Boling, Charline Bills, PA-C  metoprolol tartrate  (LOPRESSOR) 50 MG tablet Take 1 tablet (50 mg total) by mouth daily. 05/07/21 05/07/22 Yes Tayanna Talford, Charline Bills, PA-C  acetaminophen (TYLENOL) 325 MG tablet Take 650 mg by mouth every 6 (six) hours as needed.    [provider]  amiodarone (PACERONE) 200 MG tablet Take 1 tablet (200 mg) by mouth once daily, you may take 1 extra tablet (200 mg) once daily as needed for breakthrough atrial fibrillation 04/17/21   Minna Merritts, MD  apixaban (ELIQUIS) 5 MG TABS tablet Take 1 tablet (5 mg total) by mouth 2 (two) times daily. 03/25/21   [name], MD  cholestyramine (QUESTRAN) 4 g packet Take 1 packet (4 g total) by mouth daily. 03/30/21   [name], MD  diltiazem (CARDIZEM CD) 360 MG 24 hr capsule Take 1 capsule (360 mg total) by mouth daily. 03/25/21 04/24/21  Viviana Simpler I, MD  EPINEPHrine 0.3 mg/0.3 mL IJ SOAJ injection Inject 0.3 mg into the muscle as needed (for allergic reaction). Patient not taking: Reported on 04/17/2021    [provider]  meclizine (ANTIVERT) 25 MG tablet Take 25 mg by mouth 3 (three) times daily as needed. 03/16/21   [provider]  metoprolol tartrate (LOPRESSOR) 50 MG tablet Take 1 tablet (50 mg) by mouth twice daily, you may take 1 extra tablet (50 mg) once a day as needed for breakthrough atrial fibrillation 04/17/21   Minna Merritts, MD    Allergies Codeine sulfate, Other, Crestor [rosuvastatin calcium], and Penicillins  Family History  Problem Relation Age of Onset   COPD Mother    Coronary artery disease Neg Hx    Diabetes Neg Hx    Cancer Neg Hx        breast or colon cancer    Social History Social History   Tobacco Use   Smoking status: Former    Types: Cigarettes    Quit date: 08/16/1978    Years since quitting: 42.7   Smokeless tobacco: Never  Vaping Use   Vaping Use: Never used  Substance Use Topics   Alcohol use: Never   Drug use: No     Review of Systems  Constitutional: No fever/chills Eyes: No  visual changes. No discharge ENT: No upper respiratory complaints. Cardiovascular: no chest pain. Respiratory: no cough. No SOB. Gastrointestinal: No abdominal pain.  No nausea, no vomiting.  No diarrhea.  No constipation. Musculoskeletal: Negative for musculoskeletal pain. Skin: Negative for rash, abrasions, lacerations, ecchymosis. Neurological: Negative for headaches, focal weakness or numbness.  10 System ROS otherwise negative.  ____________________________________________   PHYSICAL EXAM:  VITAL SIGNS: ED Triage Vitals [05/07/21 1427]  Enc Vitals Group     BP 140/76     Pulse Rate 62     Resp 16     Temp 98.2 F (36.8 C)     Temp Source Oral     SpO2 98 %     Weight      Height      Head Circumference      Peak Flow  Pain Score      Pain Loc      Pain Edu?      Excl. in [place]?      Constitutional: Alert and oriented. Well appearing and in no acute distress. Eyes: Conjunctivae are normal. PERRL. EOMI. Head: Atraumatic. ENT:      Ears:       Nose: No congestion/rhinnorhea.      Mouth/Throat: Mucous membranes are moist.  Neck: No stridor.    Cardiovascular: Normal rate, regular rhythm. Normal S1 and S2.  Good peripheral circulation. Respiratory: Normal respiratory effort without tachypnea or retractions. Lungs CTAB. Good air entry to the bases with no decreased or absent breath sounds. Musculoskeletal: Full range of motion to all extremities. No gross deformities appreciated. Neurologic:  Normal speech and language. No gross focal neurologic deficits are appreciated.  Skin:  Skin is warm, dry and intact. No rash noted. Psychiatric: Mood and affect are normal. Speech and behavior are normal. Patient exhibits appropriate insight and judgement.   ____________________________________________   LABS (all labs ordered are listed, but only abnormal results are displayed)  Labs Reviewed  CBC WITH DIFFERENTIAL/PLATELET  COMPREHENSIVE METABOLIC PANEL    ____________________________________________  EKG   ____________________________________________  RADIOLOGY   No results found.  ____________________________________________    PROCEDURES  Procedure(s) performed:    Procedures    Medications - No data to display   ____________________________________________   INITIAL IMPRESSION / ASSESSMENT AND PLAN / ED COURSE  Pertinent labs & imaging results that were available during my care of the patient were reviewed by me and considered in my medical decision making (see chart for details).  Review of the  CSRS was performed in accordance of the [place] prior to dispensing any controlled drugs.           Patient's diagnosis is consistent with hypertension.  Patient presented to the emergency department with complaint of hypertensive urgency.  Patient has had elevated blood pressure readings over the past 3 days.  No other symptoms.  They attempted to have her primary care or cardiologist: An as needed medicine but both had stated that she either needed to come to the emergency department or did not have an available appointment.  Patient was not wanting to pursue any labs or imaging.  I discussed her evaluation and my concerns with her son who is a physician.  Together we have decided to place the patient on Norvasc to be taken as needed if her blood pressure rises above 203 systolic.  Currently her blood pressure was 140/76, she had no complaints and did not want any labs or imaging.  Return precautions discussed at length with the patient.  Follow-up with primary care or cardiology as needed..  Patient is given ED precautions to return to the ED for any worsening or new symptoms.     ____________________________________________  FINAL CLINICAL IMPRESSION(S) / ED DIAGNOSES  Final diagnoses:  Primary hypertension      NEW MEDICATIONS STARTED DURING THIS VISIT:  ED Discharge Orders          Ordered     amiodarone (PACERONE) 200 MG tablet  Daily        05/07/21 1443    metoprolol tartrate (LOPRESSOR) 50 MG tablet  Daily        05/07/21 1443    amLODipine (NORVASC) 10 MG tablet  Daily        05/07/21 1443  This chart was dictated using voice recognition software/Dragon. Despite best efforts to proofread, errors can occur which can change the meaning. Any change was purely unintentional.    Darletta Moll, PA-C 05/07/21 1455    Blake Divine, MD 05/07/21 1743

## 2021-05-07 NOTE — ED Notes (Signed)
[facility] contacted by this RN for patient discharge.

## 2021-05-07 NOTE — Telephone Encounter (Signed)
Jamie Hodges - Client TELEPHONE ADVICE RECORD [facility] Patient Name: [name] ER Gender: Female DOB: 11-14-1927 Age: 87 Y 25 M 24 D Return Phone Number: [phone] (Primary), [phone] (Secondary) Address: City/ State/ Zip: [place] [address] Client [name] - Client Client Site [place] - Hodges Physician Viviana Simpler- MD Contact Type Call Who Is Calling Patient / Member / Family / Caregiver Call Type Triage / Clinical Caller Name Jancy Minor Relationship To Patient Care Giver Return Phone Number [phone] (Primary) Chief Complaint BLOOD PRESSURE HIGH - Systolic (top number) [phone] or greater Reason for Call Symptomatic / Request for [place] states that she has a blood pressure of 220/84 with a slight headache. She is at [facility]. [facility] Not Listed ED Translation No Nurse Assessment Nurse: Rodney Cruise, RN, Sean Date/Time (Eastern Time): 05/07/2021 11:27:16 AM Confirm and document reason for call. If symptomatic, describe symptoms. ---Caller states client has BP of 220/84 with headache. Does the patient have any new or worsening symptoms? ---Yes Will a triage be completed? ---Yes Related visit to physician within the last 2 weeks? ---No Does the PT have any chronic conditions? (i.e. diabetes, asthma, this includes High risk factors for pregnancy, etc.) ---Yes List chronic conditions. ---Heart concerns. Is this a behavioral health or substance abuse call? ---No Guidelines Guideline Title Affirmed Question Affirmed Notes Nurse Date/Time (Eastern Time) Blood Pressure - High [7] Systolic BP >= 681 OR Diastolic >= 157 AND [2] cardiac or neurologic symptoms (e.g., chest pain, difficulty breathing, unsteady gait, blurred vision) Baxter, RN, Hilliard Clark 05/07/2021 11:28:24 AM PLEASE NOTE: All timestamps contained within  this report are represented as [place] Standard Time. CONFIDENTIALTY NOTICE: This fax transmission is intended only for the addressee. It contains information that is legally privileged, confidential or otherwise protected from use or disclosure. If you are not the intended recipient, you are strictly prohibited from reviewing, disclosing, copying using or disseminating any of this information or taking any action in reliance on or regarding this information. If you have received this fax in error, please notify us immediately by telephone so that we can arrange for its return to [place]. Phone: [phone], Toll-Free: [phone], Fax: [phone] Page: 2 of 2 Call Id: [id number] [place]. Time Eilene Ghazi Time) Disposition Final User 05/07/2021 11:24:08 AM Send to Urgent Ala Dach 05/07/2021 11:29:52 AM Go to ED Now Yes Baxter, RN, Lillia Dallas Disagree/Comply Comply Caller Understands Yes PreDisposition InappropriateToAsk Care Advice Given Per Guideline GO TO ED NOW: CALL EMS 911 IF: CARE ADVICE given per High Blood Pressure (Adult) guideline. Referrals GO TO FACILITY OTHER - SPECIFY

## 2021-05-07 NOTE — Telephone Encounter (Signed)
Spoke with Regan Rakers, EMT at [facility].  Advised pt go to the emergency room immediately.  Lacie states EMS has already been called and is picking pt up now and taking to [facility] ER.

## 2021-05-07 NOTE — ED Triage Notes (Signed)
Pt comes into the ED via EMS from home with c/o elevated b/p over the past 3 days, denies any sx A/ox4  220/98 210/90 60HR 98%RA

## 2021-05-08 ENCOUNTER — Telehealth: Payer: Self-pay | Admitting: Internal Medicine

## 2021-05-08 ENCOUNTER — Telehealth: Payer: Self-pay | Admitting: Cardiovascular Disease

## 2021-05-08 NOTE — Telephone Encounter (Signed)
[facility] will call [facility] and get script transferred. I have called Janci and let her know as well. She will call if any issues

## 2021-05-08 NOTE — Telephone Encounter (Signed)
Pt seen in the ED yesterday 05/07/21 for HTN Started on amiodarone (PACERONE) 200 MG tablet  Daily   metoprolol tartrate (LOPRESSOR) 50 MG tablet  Daily amLODipine (NORVASC) 10 MG tablet  Daily    Since the beginning of the month, pt's BP range 130-200 SBP HR 60-80s. Pt's home nurse called to advised of BP 9/21, pt was not reporting any symptoms. Sent message to scheduling to have an appt, pt meds were gear towards HR control more so then BP management. Advised to continue to monitor and seek ED if concern or symptoms emerge after consulting DOD.   Pt repots BP controlled today with new medication changes, will continue to monitor.

## 2021-05-08 NOTE — Telephone Encounter (Signed)
Patient dropped off envelope of blood pressure readings to be reviewed  Placed in nurse box

## 2021-05-08 NOTE — Telephone Encounter (Signed)
[facility]anci with [facility] called stating  that pt would like her  medication cholestyramine (QUESTRAN) 4 g packet, sent to [place], [place].

## 2021-05-18 ENCOUNTER — Telehealth: Payer: Self-pay | Admitting: Internal Medicine

## 2021-05-18 NOTE — Telephone Encounter (Signed)
Pt called asking how long should you be off blood thinners before you can have dental work done. Please advise.

## 2021-05-18 NOTE — Telephone Encounter (Signed)
Tried to call pt. No answer and no VM

## 2021-05-25 ENCOUNTER — Other Ambulatory Visit: Payer: Self-pay | Admitting: Internal Medicine

## 2021-05-26 NOTE — Telephone Encounter (Signed)
Left detailed message on verified VM. 

## 2021-05-28 DIAGNOSIS — Z23 Encounter for immunization: Secondary | ICD-10-CM | POA: Diagnosis not present

## 2021-06-03 ENCOUNTER — Telehealth: Payer: Self-pay | Admitting: Internal Medicine

## 2021-06-03 MED ORDER — AMLODIPINE BESYLATE 5 MG PO TABS: 5.0000 mg | ORAL_TABLET | Freq: Every day | ORAL | 3 refills | Status: DC

## 2021-06-03 NOTE — Telephone Encounter (Signed)
Pt called she needs a refill on amLODipine (NORVASC) 10 MG tablet she was given this medication to start when she went to the ED.  Please send to [address]

## 2021-06-03 NOTE — Telephone Encounter (Signed)
Will send for approval from Dr Silvio Pate.

## 2021-06-03 NOTE — Addendum Note (Signed)
Addended by: Pilar Grammes on: 06/03/2021 02:47 PM   Modules accepted: Orders

## 2021-06-03 NOTE — Telephone Encounter (Signed)
Spoke to pt. She said her BP is still elevated. I will send I the 5mg  to [place].

## 2021-07-01 ENCOUNTER — Encounter: Payer: Self-pay | Admitting: Internal Medicine

## 2021-07-01 ENCOUNTER — Ambulatory Visit (INDEPENDENT_AMBULATORY_CARE_PROVIDER_SITE_OTHER): Payer: Medicare Other | Admitting: Internal Medicine

## 2021-07-01 ENCOUNTER — Telehealth: Payer: Self-pay

## 2021-07-01 ENCOUNTER — Other Ambulatory Visit: Payer: Self-pay

## 2021-07-01 DIAGNOSIS — I480 Paroxysmal atrial fibrillation: Secondary | ICD-10-CM | POA: Diagnosis not present

## 2021-07-01 DIAGNOSIS — K529 Noninfective gastroenteritis and colitis, unspecified: Secondary | ICD-10-CM

## 2021-07-01 DIAGNOSIS — N1832 Chronic kidney disease, stage 3b: Secondary | ICD-10-CM

## 2021-07-01 DIAGNOSIS — I10 Essential (primary) hypertension: Secondary | ICD-10-CM

## 2021-07-01 DIAGNOSIS — I471 Supraventricular tachycardia: Secondary | ICD-10-CM

## 2021-07-01 NOTE — Progress Notes (Signed)
Subjective:    Patient ID: Jamie Hodges, female    DOB: 1928/06/02, 87 y.o.   MRN: [id number]  HPI Here for follow up of multiple medical issues This visit occurred during the SARS-CoV-2 public health emergency.  Safety protocols were in place, including screening questions prior to the visit, additional usage of staff PPE, and extensive cleaning of exam room while observing appropriate contact time as indicated for disinfecting solutions.   Blood pressure is much better Is taking the amlodipine now--daily BP now down from 177'L and 390'Z systolic to 009'Q Has noted some edema Does have DOE---when walking around parking lot for exercise. Will recover within 5 minutes--then can go home No chest pain No palpitations  No abdominal pain now Cholestyramine is keeping her bowels formed  Last GFR 38  Current Outpatient Medications on File Prior to Visit  Medication Sig Dispense Refill   acetaminophen (TYLENOL) 325 MG tablet Take 650 mg by mouth every 6 (six) hours as needed.     amiodarone (PACERONE) 200 MG tablet Take 1 tablet (200 mg total) by mouth daily. 10 tablet 0   amLODipine (NORVASC) 5 MG tablet Take 1 tablet (5 mg total) by mouth daily. (Patient taking differently: Take 10 mg by mouth daily.) 90 tablet 3   apixaban (ELIQUIS) 5 MG TABS tablet Take 1 tablet (5 mg total) by mouth 2 (two) times daily. 60 tablet 11   cholestyramine (QUESTRAN) 4 g packet Take 1 packet (4 g total) by mouth daily. 30 each 11   EPINEPHrine 0.3 mg/0.3 mL IJ SOAJ injection Inject 0.3 mg into the muscle as needed (for allergic reaction).     meclizine (ANTIVERT) 25 MG tablet TAKE 1 TABLET BY MOUTH THREE TIMES DAILY AS NEEDED FOR  NAUSEA  OR  DIZZINESS 60 tablet 0   metoprolol tartrate (LOPRESSOR) 50 MG tablet Take 1 tablet (50 mg total) by mouth daily. 10 tablet 0   diltiazem (CARDIZEM CD) 360 MG 24 hr capsule Take 1 capsule (360 mg total) by mouth daily. 30 capsule 11   No current facility-administered  medications on file prior to visit.    Allergies  Allergen Reactions   Codeine Sulfate     REACTION: rash/ welps   Other     Wild rice   Crestor [Rosuvastatin Calcium]     myalgia   Penicillins Hives and Rash    Has patient had a PCN reaction causing immediate rash, facial/tongue/throat swelling, SOB or lightheadedness with hypotension: Yes Has patient had a PCN reaction causing severe rash involving mucus membranes or skin necrosis: No Has patient had a PCN reaction that required hospitalization: No Has patient had a PCN reaction occurring within the last 10 years: Yes If all of the above answers are "NO", then may proceed with Cephalosporin use.    Past Medical History:  Diagnosis Date   Atrial fibrillation ([place]) 2010   One time during hospital while sick with severe diarrhea   C. difficile colitis    3/10  Severe C. dif ---had brief atrial fib then. Cath shows some blockages but no intervention   CAD (coronary artery disease) 2010   minor blockages --no intervention indicated   Hx of colonic polyps    Hyperlipidemia    Hypertension     Past Surgical History:  Procedure Laterality Date   CHOLECYSTECTOMY  3/16   ERCP N/A 01/14/2015   Procedure: ENDOSCOPIC RETROGRADE CHOLANGIOPANCREATOGRAPHY (ERCP);  Surgeon: Lucilla Lame, MD;  Location: [facility] ENDOSCOPY;  Service: Endoscopy;  Laterality:  N/A;   TONSILLECTOMY AND ADENOIDECTOMY      Family History  Problem Relation Age of Onset   COPD Mother    Coronary artery disease Neg Hx    Diabetes Neg Hx    Cancer Neg Hx        breast or colon cancer    Social History   Socioeconomic History   Marital status: Widowed    Spouse name: Not on file   Number of children: 3   Years of education: Not on file   Highest education level: Not on file  Occupational History   Occupation: retired 1st grade teacher  Tobacco Use   Smoking status: Former    Types: Cigarettes    Quit date: 08/16/1978    Years since quitting: 42.9    Smokeless tobacco: Never  Vaping Use   Vaping Use: Never used  Substance and Sexual Activity   Alcohol use: Never   Drug use: No   Sexual activity: Not on file  Other Topics Concern   Not on file  Social History Narrative   Has living will.    Son Mikki Santee (MD) to make health care decisions.--then son Laurey Arrow   Has DNR order in past and requests again--done   No tube feeds if cognitively unaware         Social Determinants of Health   Financial Resource Strain: Not on file  Food Insecurity: Not on file  Transportation Needs: Not on file  Physical Activity: Not on file  Stress: Not on file  Social Connections: Not on file  Intimate Partner Violence: Not on file   Review of Systems Appetite is fine Weight is back up again to her baseline Not sleeping well---tylenol PM no help (told her to avoid this)    Objective:   Physical Exam Constitutional:      Appearance: Normal appearance.  Cardiovascular:     Rate and Rhythm: Normal rate and regular rhythm.     Heart sounds: No murmur heard.   No gallop.  Pulmonary:     Effort: Pulmonary effort is normal.     Breath sounds: Normal breath sounds. No wheezing or rales.  Musculoskeletal:     Cervical back: Neck supple.     Comments: Slight ankle edema  Lymphadenopathy:     Cervical: No cervical adenopathy.  Neurological:     Mental Status: She is alert.  Psychiatric:        Mood and Affect: Mood normal.        Behavior: Behavior normal.           Assessment & Plan:

## 2021-07-01 NOTE — Assessment & Plan Note (Signed)
Regular again on amiodarone Is on eliquis

## 2021-07-01 NOTE — Telephone Encounter (Signed)
Unable to reach pt by phone and left detailed v/m per DPR that confirmed pt has in office appt at [facility] office 07/01/21 at 11 AM. Pt to arrive at 10:45 to get checked in out front and asked that pt wear a mask. If any questions pt is to cb to [phone].

## 2021-07-01 NOTE — Assessment & Plan Note (Signed)
Doing well with the cholestyramine

## 2021-07-01 NOTE — Assessment & Plan Note (Signed)
No symptoms of recurrence on beta blocker and CCB

## 2021-07-01 NOTE — Patient Instructions (Addendum)
Please stop the tylenol PM (the diphenhydramine is not recommended at your age). You can try melatonin for sleep---start with 3 mg and you can double it if you need to. You should only be taking only 1 amlodipine 5mg  daily

## 2021-07-01 NOTE — Telephone Encounter (Signed)
[place] Night - Client Nonclinical Telephone Record  [facility] Client [place] Night - Client Client Site [place] Physician Viviana Simpler- MD Contact Type Call Who Is Calling Patient / Member / Family / Caregiver Caller Name [place] Phone Number [phone] Patient Name Jamie Hodges Patient DOB [birth year] Call Type Message Only Information Provided Reason for Call Request for General Office Information Initial Comment Caller is confirming her 11 am appointment for 06/30/2021. Additional Comment Provided Office Hours. Disp. Time Disposition Final User 06/30/2021 5:22:38 PM General Information Provided Yes Zebedee Iba Call Closed By: Zebedee Iba Transaction Date/Time: 06/30/2021 5:20:48 PM (ET

## 2021-07-01 NOTE — Assessment & Plan Note (Signed)
GFR slightly down Would not act on this at her age 87 at next visit

## 2021-07-01 NOTE — Assessment & Plan Note (Signed)
BP Readings from Last 3 Encounters:  07/01/21 140/78  04/17/21 (!) 170/68  03/30/21 138/72   Better now with addition of amlodipine Discussed that she should only be taking 5mg  of this daily Also metoprolol and diltiazem

## 2021-07-13 ENCOUNTER — Other Ambulatory Visit: Payer: Self-pay

## 2021-07-13 ENCOUNTER — Emergency Department: Payer: Medicare Other

## 2021-07-13 DIAGNOSIS — Z743 Need for continuous supervision: Secondary | ICD-10-CM | POA: Diagnosis not present

## 2021-07-13 DIAGNOSIS — K567 Ileus, unspecified: Secondary | ICD-10-CM | POA: Diagnosis present

## 2021-07-13 DIAGNOSIS — N183 Chronic kidney disease, stage 3 unspecified: Secondary | ICD-10-CM | POA: Diagnosis not present

## 2021-07-13 DIAGNOSIS — Z79899 Other long term (current) drug therapy: Secondary | ICD-10-CM | POA: Diagnosis not present

## 2021-07-13 DIAGNOSIS — Z66 Do not resuscitate: Secondary | ICD-10-CM | POA: Diagnosis not present

## 2021-07-13 DIAGNOSIS — K56600 Partial intestinal obstruction, unspecified as to cause: Secondary | ICD-10-CM | POA: Diagnosis not present

## 2021-07-13 DIAGNOSIS — K56609 Unspecified intestinal obstruction, unspecified as to partial versus complete obstruction: Secondary | ICD-10-CM | POA: Diagnosis not present

## 2021-07-13 DIAGNOSIS — I700 Atherosclerosis of aorta: Secondary | ICD-10-CM | POA: Diagnosis not present

## 2021-07-13 DIAGNOSIS — Z741 Need for assistance with personal care: Secondary | ICD-10-CM | POA: Diagnosis not present

## 2021-07-13 DIAGNOSIS — I499 Cardiac arrhythmia, unspecified: Secondary | ICD-10-CM | POA: Diagnosis not present

## 2021-07-13 DIAGNOSIS — Z4682 Encounter for fitting and adjustment of non-vascular catheter: Secondary | ICD-10-CM | POA: Diagnosis not present

## 2021-07-13 DIAGNOSIS — Z7189 Other specified counseling: Secondary | ICD-10-CM | POA: Diagnosis not present

## 2021-07-13 DIAGNOSIS — Z888 Allergy status to other drugs, medicaments and biological substances status: Secondary | ICD-10-CM

## 2021-07-13 DIAGNOSIS — Z9049 Acquired absence of other specified parts of digestive tract: Secondary | ICD-10-CM

## 2021-07-13 DIAGNOSIS — R062 Wheezing: Secondary | ICD-10-CM | POA: Diagnosis not present

## 2021-07-13 DIAGNOSIS — Z7901 Long term (current) use of anticoagulants: Secondary | ICD-10-CM | POA: Diagnosis not present

## 2021-07-13 DIAGNOSIS — J811 Chronic pulmonary edema: Secondary | ICD-10-CM | POA: Diagnosis not present

## 2021-07-13 DIAGNOSIS — E860 Dehydration: Secondary | ICD-10-CM | POA: Diagnosis present

## 2021-07-13 DIAGNOSIS — D631 Anemia in chronic kidney disease: Secondary | ICD-10-CM | POA: Diagnosis present

## 2021-07-13 DIAGNOSIS — R6889 Other general symptoms and signs: Secondary | ICD-10-CM | POA: Diagnosis not present

## 2021-07-13 DIAGNOSIS — R197 Diarrhea, unspecified: Secondary | ICD-10-CM | POA: Diagnosis not present

## 2021-07-13 DIAGNOSIS — E8721 Acute metabolic acidosis: Secondary | ICD-10-CM | POA: Diagnosis not present

## 2021-07-13 DIAGNOSIS — R278 Other lack of coordination: Secondary | ICD-10-CM | POA: Diagnosis not present

## 2021-07-13 DIAGNOSIS — N1832 Chronic kidney disease, stage 3b: Secondary | ICD-10-CM | POA: Diagnosis present

## 2021-07-13 DIAGNOSIS — Z87891 Personal history of nicotine dependence: Secondary | ICD-10-CM

## 2021-07-13 DIAGNOSIS — E785 Hyperlipidemia, unspecified: Secondary | ICD-10-CM | POA: Diagnosis present

## 2021-07-13 DIAGNOSIS — E872 Acidosis, unspecified: Secondary | ICD-10-CM | POA: Diagnosis present

## 2021-07-13 DIAGNOSIS — R110 Nausea: Secondary | ICD-10-CM | POA: Diagnosis not present

## 2021-07-13 DIAGNOSIS — N179 Acute kidney failure, unspecified: Secondary | ICD-10-CM | POA: Diagnosis present

## 2021-07-13 DIAGNOSIS — K55019 Acute (reversible) ischemia of small intestine, extent unspecified: Secondary | ICD-10-CM | POA: Diagnosis present

## 2021-07-13 DIAGNOSIS — R001 Bradycardia, unspecified: Secondary | ICD-10-CM | POA: Diagnosis not present

## 2021-07-13 DIAGNOSIS — I2510 Atherosclerotic heart disease of native coronary artery without angina pectoris: Secondary | ICD-10-CM | POA: Diagnosis present

## 2021-07-13 DIAGNOSIS — I129 Hypertensive chronic kidney disease with stage 1 through stage 4 chronic kidney disease, or unspecified chronic kidney disease: Secondary | ICD-10-CM | POA: Diagnosis present

## 2021-07-13 DIAGNOSIS — Z20822 Contact with and (suspected) exposure to covid-19: Secondary | ICD-10-CM | POA: Diagnosis present

## 2021-07-13 DIAGNOSIS — R2681 Unsteadiness on feet: Secondary | ICD-10-CM | POA: Diagnosis not present

## 2021-07-13 DIAGNOSIS — I10 Essential (primary) hypertension: Secondary | ICD-10-CM | POA: Diagnosis not present

## 2021-07-13 DIAGNOSIS — K529 Noninfective gastroenteritis and colitis, unspecified: Secondary | ICD-10-CM | POA: Diagnosis not present

## 2021-07-13 DIAGNOSIS — Z885 Allergy status to narcotic agent status: Secondary | ICD-10-CM | POA: Diagnosis not present

## 2021-07-13 DIAGNOSIS — R1084 Generalized abdominal pain: Secondary | ICD-10-CM | POA: Diagnosis not present

## 2021-07-13 DIAGNOSIS — R609 Edema, unspecified: Secondary | ICD-10-CM | POA: Diagnosis not present

## 2021-07-13 DIAGNOSIS — Z825 Family history of asthma and other chronic lower respiratory diseases: Secondary | ICD-10-CM | POA: Diagnosis not present

## 2021-07-13 DIAGNOSIS — K5730 Diverticulosis of large intestine without perforation or abscess without bleeding: Secondary | ICD-10-CM | POA: Diagnosis not present

## 2021-07-13 DIAGNOSIS — K559 Vascular disorder of intestine, unspecified: Secondary | ICD-10-CM | POA: Diagnosis not present

## 2021-07-13 DIAGNOSIS — R109 Unspecified abdominal pain: Secondary | ICD-10-CM | POA: Diagnosis not present

## 2021-07-13 DIAGNOSIS — K6389 Other specified diseases of intestine: Secondary | ICD-10-CM | POA: Diagnosis present

## 2021-07-13 DIAGNOSIS — M6281 Muscle weakness (generalized): Secondary | ICD-10-CM | POA: Diagnosis not present

## 2021-07-13 DIAGNOSIS — Z515 Encounter for palliative care: Secondary | ICD-10-CM | POA: Diagnosis not present

## 2021-07-13 DIAGNOSIS — K429 Umbilical hernia without obstruction or gangrene: Secondary | ICD-10-CM | POA: Diagnosis not present

## 2021-07-13 DIAGNOSIS — Z880 Allergy status to penicillin: Secondary | ICD-10-CM

## 2021-07-13 DIAGNOSIS — K439 Ventral hernia without obstruction or gangrene: Secondary | ICD-10-CM | POA: Diagnosis not present

## 2021-07-13 DIAGNOSIS — I480 Paroxysmal atrial fibrillation: Secondary | ICD-10-CM | POA: Diagnosis present

## 2021-07-13 LAB — COMPREHENSIVE METABOLIC PANEL
ALT: 10 U/L (ref 0–44)
AST: 21 U/L (ref 15–41)
Albumin: 3.7 g/dL (ref 3.5–5.0)
Alkaline Phosphatase: 67 U/L (ref 38–126)
Anion gap: 8 (ref 5–15)
BUN: 23 mg/dL (ref 8–23)
CO2: 21 mmol/L — ABNORMAL LOW (ref 22–32)
Calcium: 8.7 mg/dL — ABNORMAL LOW (ref 8.9–10.3)
Chloride: 110 mmol/L (ref 98–111)
Creatinine, Ser: 1.33 mg/dL — ABNORMAL HIGH (ref 0.44–1.00)
GFR, Estimated: 38 mL/min — ABNORMAL LOW (ref >60)
Glucose, Bld: 149 mg/dL — ABNORMAL HIGH (ref 70–99)
Potassium: 3.6 mmol/L (ref 3.5–5.1)
Sodium: 139 mmol/L (ref 135–145)
Total Bilirubin: 0.9 mg/dL (ref 0.3–1.2)
Total Protein: 6.8 g/dL (ref 6.5–8.1)

## 2021-07-13 LAB — CBC
HCT: 33.9 % — ABNORMAL LOW (ref 36.0–46.0)
Hemoglobin: 11.6 g/dL — ABNORMAL LOW (ref 12.0–15.0)
MCH: 32.2 pg (ref 26.0–34.0)
MCHC: 34.2 g/dL (ref 30.0–36.0)
MCV: 94.2 fL (ref 80.0–100.0)
Platelets: 187 10*3/uL (ref 150–400)
RBC: 3.6 MIL/uL — ABNORMAL LOW (ref 3.87–5.11)
RDW: 13.9 % (ref 11.5–15.5)
WBC: 7.4 10*3/uL (ref 4.0–10.5)
nRBC: 0 % (ref 0.0–0.2)

## 2021-07-13 LAB — LIPASE, BLOOD: Lipase: 96 U/L — ABNORMAL HIGH (ref 11–51)

## 2021-07-13 LAB — TROPONIN I (HIGH SENSITIVITY): Troponin I (High Sensitivity): 6 ng/L (ref <18)

## 2021-07-13 MED ORDER — IOHEXOL 300 MG/ML  SOLN
75.0000 mL | Freq: Once | INTRAMUSCULAR | Status: AC | PRN
  Administered 2021-07-14: 75 mL via INTRAVENOUS

## 2021-07-13 MED ORDER — MORPHINE SULFATE (PF) 4 MG/ML IV SOLN
4.0000 mg | Freq: Once | INTRAVENOUS | Status: AC
  Administered 2021-07-13: 4 mg via INTRAVENOUS

## 2021-07-13 MED ORDER — ONDANSETRON HCL 4 MG/2ML IJ SOLN
4.0000 mg | Freq: Once | INTRAMUSCULAR | Status: AC
  Administered 2021-07-13: 4 mg via INTRAVENOUS

## 2021-07-13 NOTE — ED Triage Notes (Addendum)
Pt to ed via ems from [facility]. Pt states that she began to have abd pain earlier tonight, pain is generalized and c/o n/d. Pts abd appears distended, pt states this is not normal for her. Pt is on eliquis. Denies back pain or radiation.

## 2021-07-14 ENCOUNTER — Encounter: Payer: Self-pay | Admitting: Radiology

## 2021-07-14 ENCOUNTER — Inpatient Hospital Stay
Admission: EM | Admit: 2021-07-14 | Discharge: 2021-07-25 | DRG: 394 | Disposition: A | Discharge status: Skilled Nursing Facility | Payer: Medicare Other | Source: Skilled Nursing Facility | Attending: Obstetrics and Gynecology | Admitting: Obstetrics and Gynecology

## 2021-07-14 ENCOUNTER — Emergency Department: Payer: Medicare Other

## 2021-07-14 ENCOUNTER — Other Ambulatory Visit: Payer: Self-pay

## 2021-07-14 DIAGNOSIS — R197 Diarrhea, unspecified: Secondary | ICD-10-CM

## 2021-07-14 DIAGNOSIS — K6389 Other specified diseases of intestine: Secondary | ICD-10-CM

## 2021-07-14 DIAGNOSIS — N183 Chronic kidney disease, stage 3 unspecified: Secondary | ICD-10-CM | POA: Diagnosis not present

## 2021-07-14 DIAGNOSIS — I4891 Unspecified atrial fibrillation: Secondary | ICD-10-CM

## 2021-07-14 DIAGNOSIS — K55019 Acute (reversible) ischemia of small intestine, extent unspecified: Secondary | ICD-10-CM | POA: Diagnosis present

## 2021-07-14 DIAGNOSIS — R1084 Generalized abdominal pain: Secondary | ICD-10-CM

## 2021-07-14 DIAGNOSIS — Z825 Family history of asthma and other chronic lower respiratory diseases: Secondary | ICD-10-CM | POA: Diagnosis not present

## 2021-07-14 DIAGNOSIS — R062 Wheezing: Secondary | ICD-10-CM | POA: Diagnosis not present

## 2021-07-14 DIAGNOSIS — I2510 Atherosclerotic heart disease of native coronary artery without angina pectoris: Secondary | ICD-10-CM | POA: Diagnosis present

## 2021-07-14 DIAGNOSIS — E785 Hyperlipidemia, unspecified: Secondary | ICD-10-CM | POA: Diagnosis present

## 2021-07-14 DIAGNOSIS — I129 Hypertensive chronic kidney disease with stage 1 through stage 4 chronic kidney disease, or unspecified chronic kidney disease: Secondary | ICD-10-CM | POA: Diagnosis present

## 2021-07-14 DIAGNOSIS — K559 Vascular disorder of intestine, unspecified: Secondary | ICD-10-CM | POA: Diagnosis present

## 2021-07-14 DIAGNOSIS — Z9049 Acquired absence of other specified parts of digestive tract: Secondary | ICD-10-CM | POA: Diagnosis not present

## 2021-07-14 DIAGNOSIS — R110 Nausea: Secondary | ICD-10-CM

## 2021-07-14 DIAGNOSIS — I480 Paroxysmal atrial fibrillation: Secondary | ICD-10-CM | POA: Diagnosis present

## 2021-07-14 DIAGNOSIS — E8721 Acute metabolic acidosis: Secondary | ICD-10-CM

## 2021-07-14 DIAGNOSIS — R609 Edema, unspecified: Secondary | ICD-10-CM | POA: Diagnosis not present

## 2021-07-14 DIAGNOSIS — M6281 Muscle weakness (generalized): Secondary | ICD-10-CM | POA: Diagnosis not present

## 2021-07-14 DIAGNOSIS — E872 Acidosis, unspecified: Secondary | ICD-10-CM | POA: Diagnosis present

## 2021-07-14 DIAGNOSIS — Z7901 Long term (current) use of anticoagulants: Secondary | ICD-10-CM | POA: Diagnosis not present

## 2021-07-14 DIAGNOSIS — K429 Umbilical hernia without obstruction or gangrene: Secondary | ICD-10-CM | POA: Diagnosis not present

## 2021-07-14 DIAGNOSIS — N1832 Chronic kidney disease, stage 3b: Secondary | ICD-10-CM | POA: Diagnosis present

## 2021-07-14 DIAGNOSIS — N179 Acute kidney failure, unspecified: Secondary | ICD-10-CM | POA: Diagnosis present

## 2021-07-14 DIAGNOSIS — I679 Cerebrovascular disease, unspecified: Secondary | ICD-10-CM | POA: Diagnosis present

## 2021-07-14 DIAGNOSIS — Z79899 Other long term (current) drug therapy: Secondary | ICD-10-CM | POA: Diagnosis not present

## 2021-07-14 DIAGNOSIS — K529 Noninfective gastroenteritis and colitis, unspecified: Secondary | ICD-10-CM | POA: Diagnosis not present

## 2021-07-14 DIAGNOSIS — Z7189 Other specified counseling: Secondary | ICD-10-CM | POA: Diagnosis not present

## 2021-07-14 DIAGNOSIS — K56600 Partial intestinal obstruction, unspecified as to cause: Secondary | ICD-10-CM | POA: Diagnosis not present

## 2021-07-14 DIAGNOSIS — E860 Dehydration: Secondary | ICD-10-CM | POA: Diagnosis present

## 2021-07-14 DIAGNOSIS — N184 Chronic kidney disease, stage 4 (severe): Secondary | ICD-10-CM | POA: Diagnosis present

## 2021-07-14 DIAGNOSIS — Z4682 Encounter for fitting and adjustment of non-vascular catheter: Secondary | ICD-10-CM | POA: Diagnosis not present

## 2021-07-14 DIAGNOSIS — Z515 Encounter for palliative care: Secondary | ICD-10-CM | POA: Diagnosis not present

## 2021-07-14 DIAGNOSIS — K5730 Diverticulosis of large intestine without perforation or abscess without bleeding: Secondary | ICD-10-CM | POA: Diagnosis not present

## 2021-07-14 DIAGNOSIS — Z888 Allergy status to other drugs, medicaments and biological substances status: Secondary | ICD-10-CM | POA: Diagnosis not present

## 2021-07-14 DIAGNOSIS — K56609 Unspecified intestinal obstruction, unspecified as to partial versus complete obstruction: Secondary | ICD-10-CM

## 2021-07-14 DIAGNOSIS — R109 Unspecified abdominal pain: Secondary | ICD-10-CM

## 2021-07-14 DIAGNOSIS — I25119 Atherosclerotic heart disease of native coronary artery with unspecified angina pectoris: Secondary | ICD-10-CM

## 2021-07-14 DIAGNOSIS — Z741 Need for assistance with personal care: Secondary | ICD-10-CM | POA: Diagnosis not present

## 2021-07-14 DIAGNOSIS — K567 Ileus, unspecified: Secondary | ICD-10-CM

## 2021-07-14 DIAGNOSIS — Z880 Allergy status to penicillin: Secondary | ICD-10-CM | POA: Diagnosis not present

## 2021-07-14 DIAGNOSIS — R2681 Unsteadiness on feet: Secondary | ICD-10-CM | POA: Diagnosis not present

## 2021-07-14 DIAGNOSIS — Z20822 Contact with and (suspected) exposure to covid-19: Secondary | ICD-10-CM | POA: Diagnosis present

## 2021-07-14 DIAGNOSIS — Z66 Do not resuscitate: Secondary | ICD-10-CM | POA: Diagnosis present

## 2021-07-14 DIAGNOSIS — I700 Atherosclerosis of aorta: Secondary | ICD-10-CM | POA: Diagnosis present

## 2021-07-14 DIAGNOSIS — D631 Anemia in chronic kidney disease: Secondary | ICD-10-CM | POA: Diagnosis present

## 2021-07-14 DIAGNOSIS — R278 Other lack of coordination: Secondary | ICD-10-CM | POA: Diagnosis not present

## 2021-07-14 DIAGNOSIS — J811 Chronic pulmonary edema: Secondary | ICD-10-CM | POA: Diagnosis not present

## 2021-07-14 DIAGNOSIS — Z885 Allergy status to narcotic agent status: Secondary | ICD-10-CM | POA: Diagnosis not present

## 2021-07-14 DIAGNOSIS — K439 Ventral hernia without obstruction or gangrene: Secondary | ICD-10-CM | POA: Diagnosis not present

## 2021-07-14 DIAGNOSIS — R001 Bradycardia, unspecified: Secondary | ICD-10-CM | POA: Diagnosis present

## 2021-07-14 DIAGNOSIS — I10 Essential (primary) hypertension: Secondary | ICD-10-CM | POA: Diagnosis present

## 2021-07-14 LAB — URINALYSIS, ROUTINE W REFLEX MICROSCOPIC
Bilirubin Urine: NEGATIVE
Glucose, UA: NEGATIVE mg/dL
Ketones, ur: NEGATIVE mg/dL
Leukocytes,Ua: NEGATIVE
Nitrite: NEGATIVE
Protein, ur: 100 mg/dL — AB
Specific Gravity, Urine: 1.02 (ref 1.005–1.030)
pH: 5.5 (ref 5.0–8.0)

## 2021-07-14 LAB — RESP PANEL BY RT-PCR (FLU A&B, COVID) ARPGX2
Influenza A by PCR: NEGATIVE
Influenza B by PCR: NEGATIVE
SARS Coronavirus 2 by RT PCR: NEGATIVE

## 2021-07-14 LAB — URINALYSIS, MICROSCOPIC (REFLEX)

## 2021-07-14 LAB — PROTIME-INR
INR: 1.4 — ABNORMAL HIGH (ref 0.8–1.2)
Prothrombin Time: 16.8 seconds — ABNORMAL HIGH (ref 11.4–15.2)

## 2021-07-14 LAB — LACTIC ACID, PLASMA
Lactic Acid, Venous: 2.4 mmol/L (ref 0.5–1.9)
Lactic Acid, Venous: 3.4 mmol/L (ref 0.5–1.9)

## 2021-07-14 LAB — HEPARIN LEVEL (UNFRACTIONATED): Heparin Unfractionated: >1.1 IU/mL — ABNORMAL HIGH (ref 0.30–0.70)

## 2021-07-14 LAB — APTT: aPTT: 28 seconds (ref 24–36)

## 2021-07-14 MED ORDER — METRONIDAZOLE 500 MG/100ML IV SOLN
500.0000 mg | Freq: Once | INTRAVENOUS | Status: AC
  Administered 2021-07-14: 500 mg via INTRAVENOUS

## 2021-07-14 MED ORDER — ONDANSETRON HCL 4 MG/2ML IJ SOLN
4.0000 mg | Freq: Once | INTRAMUSCULAR | Status: AC
  Administered 2021-07-14: 4 mg via INTRAVENOUS

## 2021-07-14 MED ORDER — APIXABAN 5 MG PO TABS
5.0000 mg | ORAL_TABLET | Freq: Two times a day (BID) | ORAL | Status: DC
  Administered 2021-07-14 – 2021-07-15 (×3): 5 mg via ORAL
  Filled 2021-07-14 (×2): qty 1

## 2021-07-14 MED ORDER — METOPROLOL TARTRATE 5 MG/5ML IV SOLN
2.5000 mg | Freq: Four times a day (QID) | INTRAVENOUS | Status: DC | PRN
  Administered 2021-07-15: 2.5 mg via INTRAVENOUS
  Filled 2021-07-14: qty 5

## 2021-07-14 MED ORDER — ONDANSETRON HCL 4 MG/2ML IJ SOLN
4.0000 mg | Freq: Four times a day (QID) | INTRAMUSCULAR | Status: DC | PRN
  Administered 2021-07-14 – 2021-07-19 (×5): 4 mg via INTRAVENOUS
  Filled 2021-07-14 (×4): qty 2

## 2021-07-14 MED ORDER — HEPARIN (PORCINE) 25000 UT/250ML-% IV SOLN
900.0000 [IU]/h | INTRAVENOUS | Status: DC
  Administered 2021-07-14 (×2): 900 [IU]/h via INTRAVENOUS

## 2021-07-14 MED ORDER — LACTATED RINGERS IV SOLN: INTRAVENOUS | Status: AC

## 2021-07-14 MED ORDER — MORPHINE SULFATE (PF) 4 MG/ML IV SOLN: 4.0000 mg | INTRAVENOUS | Status: DC | PRN

## 2021-07-14 MED ORDER — ACETAMINOPHEN 325 MG PO TABS: 650.0000 mg | ORAL_TABLET | Freq: Four times a day (QID) | ORAL | Status: DC | PRN

## 2021-07-14 MED ORDER — MORPHINE SULFATE (PF) 2 MG/ML IV SOLN
2.0000 mg | INTRAVENOUS | Status: DC | PRN
  Administered 2021-07-14 (×2): 2 mg via INTRAVENOUS

## 2021-07-14 MED ORDER — SODIUM CHLORIDE 0.9 % IV SOLN
500.0000 mg | Freq: Two times a day (BID) | INTRAVENOUS | Status: DC
  Administered 2021-07-14: 500 mg via INTRAVENOUS
  Filled 2021-07-14: qty 0.5
  Filled 2021-07-14: qty 500

## 2021-07-14 MED ORDER — METRONIDAZOLE 500 MG/100ML IV SOLN
500.0000 mg | Freq: Two times a day (BID) | INTRAVENOUS | Status: DC
  Administered 2021-07-14 – 2021-07-24 (×20): 500 mg via INTRAVENOUS
  Filled 2021-07-14 (×22): qty 100

## 2021-07-14 MED ORDER — ONDANSETRON HCL 4 MG PO TABS: 4.0000 mg | ORAL_TABLET | Freq: Four times a day (QID) | ORAL | Status: DC | PRN

## 2021-07-14 MED ORDER — SODIUM CHLORIDE 0.9 % IV SOLN
2.0000 g | INTRAVENOUS | Status: DC
  Administered 2021-07-15: 2 g via INTRAVENOUS
  Filled 2021-07-14 (×2): qty 2

## 2021-07-14 MED ORDER — VANCOMYCIN HCL IN DEXTROSE 1-5 GM/200ML-% IV SOLN
1000.0000 mg | Freq: Once | INTRAVENOUS | Status: AC
  Administered 2021-07-14: 1000 mg via INTRAVENOUS

## 2021-07-14 MED ORDER — METOPROLOL TARTRATE 5 MG/5ML IV SOLN: 2.5000 mg | Freq: Four times a day (QID) | INTRAVENOUS | Status: DC

## 2021-07-14 MED ORDER — LACTATED RINGERS IV BOLUS
1000.0000 mL | Freq: Once | INTRAVENOUS | Status: AC
  Administered 2021-07-14: 1000 mL via INTRAVENOUS

## 2021-07-14 MED ORDER — ACETAMINOPHEN 650 MG RE SUPP
650.0000 mg | Freq: Four times a day (QID) | RECTAL | Status: DC | PRN
  Filled 2021-07-14: qty 1

## 2021-07-14 MED ORDER — SODIUM CHLORIDE 0.9 % IV SOLN
2.0000 g | Freq: Once | INTRAVENOUS | Status: AC
  Administered 2021-07-14: 2 g via INTRAVENOUS

## 2021-07-14 MED ORDER — MORPHINE SULFATE (PF) 4 MG/ML IV SOLN
4.0000 mg | Freq: Once | INTRAVENOUS | Status: AC
  Administered 2021-07-14: 4 mg via INTRAVENOUS

## 2021-07-14 NOTE — H&P (Signed)
History and Physical    Jamie Hodges ZDG:[id number] DOB: [birth year] DOA: 07/14/2021  PCP: [name], MD   Patient coming from: [facility]  I have personally briefly reviewed patient's relevant medical records in [place]  Chief Complaint: Abdominal pain  HPI: Jamie Hodges is a 87 y.o. female with medical history significant for Paroxysmal atrial fibrillation, HTN, recurrent episodes of acute colonic ischemia, CKD 3B, anemia of chronic disease who presents to the ED from [facility] with a complaint of generalized abdominal pain, nausea and diarrhea typical for her episodes of ischemic colitis.  Patient has refused surgery in the past and continues to decline surgical intervention.  She denies vomiting.  Denies cough or shortness of breath and denies chest pain  ED course: Bradycardic at 48 on arrival with BP 128/108 and tachypneic to 24 with normal O2 sats Blood work WBC 7400 with lactic acid 2.4 Lipase 96 with normal LFTs Troponin 6 Creatinine at baseline of 1.33  EKG, personally reviewed and interpreted: Junctional rhythm at 60 with no acute ST-T wave changes  Imaging: CT abdomen and pelvis with contrast with findings consistent with enteritis, pneumatosis and portal vein gas secondary to bowel ischemia among other findings.  The ED provider spoke with surgeon, Dr. Hampton Abbot who recommended IV antibiotics and n.p.o. x24 hours  Review of Systems: As per HPI otherwise all other systems on review of systems negative.   Assessment/Plan    Ischemic colitis ([place]) -N.p.o. x24 hours - IV fluids, IV pain meds and IV antiemetics - IV Meropenem - Surgery was consulted from ED with above supportive recommendations    AF (paroxysmal atrial fibrillation) (HCC) - IV heparin to replace eliquis and IV metoprolol  for rate control until no longer NPO  Hypertension - metoprolol IV as above    DVT prophylaxis: heparin Code Status: DNR Family Communication:  none  Disposition  Plan: Back to previous home environment Consults called: none  Status:At the time of admission, it appears that the appropriate admission status for this patient is INPATIENT. This is judged to be reasonable and necessary in order to provide the required intensity of service to ensure the patient's safety given the presenting symptoms, physical exam findings, and initial radiographic and laboratory data in the context of their  Comorbid conditions.   Patient requires inpatient status due to high intensity of service, high risk for further deterioration and high frequency of surveillance required.   I certify that at the point of admission it is my clinical judgment that the patient will require inpatient hospital care spanning beyond 2 midnights     Physical Exam: Vitals:   07/14/21 0130 07/14/21 0138 07/14/21 0230 07/14/21 0300  BP: (!) 150/130  (!) 127/108 (!) 156/82  Pulse: (!) 59  85 83  Resp: 16  20 (!) 24  Temp:  (!) 97.5 F (36.4 C)    TempSrc:  Oral    SpO2: 97%  96% 93%  Weight:      Height:       Constitutional: Alert, oriented x 3 . Not in any apparent distress HEENT:      Head: Normocephalic and atraumatic.         Eyes: PERLA, EOMI, Conjunctivae are normal. Sclera is non-icteric.       Mouth/Throat: Mucous membranes are moist.       Neck: Supple with no signs of meningismus. Cardiovascular: Regular rate and rhythm. No murmurs, gallops, or rubs. 2+ symmetrical distal pulses are present . No  JVD. No  LE edema Respiratory: Respiratory effort normal .Lungs sounds clear bilaterally. No wheezes, crackles, or rhonchi.  Gastrointestinal: Soft, generalized tenderness with mild distention. Genitourinary: No CVA tenderness. Musculoskeletal: Nontender with normal range of motion in all extremities. No cyanosis, or erythema of extremities. Neurologic:  Face is symmetric. Moving all extremities. No gross focal neurologic deficits . Skin: Skin is warm, dry.  No rash or  ulcers Psychiatric: Mood and affect are appropriate     Past Medical History:  Diagnosis Date   Atrial fibrillation ([place]) 2010   One time during hospital while sick with severe diarrhea   C. difficile colitis    3/10  Severe C. dif ---had brief atrial fib then. Cath shows some blockages but no intervention   CAD (coronary artery disease) 2010   minor blockages --no intervention indicated   Hx of colonic polyps    Hyperlipidemia    Hypertension     Past Surgical History:  Procedure Laterality Date   CHOLECYSTECTOMY  3/16   ERCP N/A 01/14/2015   Procedure: ENDOSCOPIC RETROGRADE CHOLANGIOPANCREATOGRAPHY (ERCP);  Surgeon: Lucilla Lame, MD;  Location: [facility] ENDOSCOPY;  Service: Endoscopy;  Laterality: N/A;   TONSILLECTOMY AND ADENOIDECTOMY       reports that she quit smoking about 42 years ago. Her smoking use included cigarettes. She has never used smokeless tobacco. She reports that she does not drink alcohol and does not use drugs.  Allergies  Allergen Reactions   Codeine Sulfate     REACTION: rash/ welps   Other     Wild rice   Crestor [Rosuvastatin Calcium]     myalgia   Penicillins Hives and Rash    Has patient had a PCN reaction causing immediate rash, facial/tongue/throat swelling, SOB or lightheadedness with hypotension: Yes Has patient had a PCN reaction causing severe rash involving mucus membranes or skin necrosis: No Has patient had a PCN reaction that required hospitalization: No Has patient had a PCN reaction occurring within the last 10 years: Yes If all of the above answers are "NO", then may proceed with Cephalosporin use.    Family History  Problem Relation Age of Onset   COPD Mother    Coronary artery disease Neg Hx    Diabetes Neg Hx    Cancer Neg Hx        breast or colon cancer      Prior to Admission medications   Medication Sig Start Date End Date Taking? Authorizing Provider  acetaminophen (TYLENOL) 325 MG tablet Take 650 mg by mouth every 6  (six) hours as needed.    [provider]  amiodarone (PACERONE) 200 MG tablet Take 1 tablet (200 mg total) by mouth daily. 05/07/21   Cuthriell, Charline Bills, PA-C  amLODipine (NORVASC) 5 MG tablet Take 1 tablet (5 mg total) by mouth daily. Patient taking differently: Take 10 mg by mouth daily. 06/03/21   [name], MD  apixaban (ELIQUIS) 5 MG TABS tablet Take 1 tablet (5 mg total) by mouth 2 (two) times daily. 03/25/21   [name], MD  cholestyramine (QUESTRAN) 4 g packet Take 1 packet (4 g total) by mouth daily. 03/30/21   [name], MD  diltiazem (CARDIZEM CD) 360 MG 24 hr capsule Take 1 capsule (360 mg total) by mouth daily. 03/25/21 04/24/21  Viviana Simpler I, MD  EPINEPHrine 0.3 mg/0.3 mL IJ SOAJ injection Inject 0.3 mg into the muscle as needed (for allergic reaction).    [provider]  meclizine (ANTIVERT) 25 MG tablet TAKE 1 TABLET BY MOUTH THREE TIMES DAILY AS NEEDED FOR  NAUSEA  OR  DIZZINESS 05/25/21   [name], MD  metoprolol tartrate (LOPRESSOR) 50 MG tablet Take 1 tablet (50 mg total) by mouth daily. 05/07/21 05/07/22  Cuthriell, Charline Bills, PA-C      Labs on Admission: I have personally reviewed following labs and imaging studies  CBC: Recent Labs  Lab 07/13/21 2230  WBC 7.4  HGB 11.6*  HCT 33.9*  MCV 94.2  PLT 932   Basic Metabolic Panel: Recent Labs  Lab 07/13/21 2230  NA 139  K 3.6  CL 110  CO2 21*  GLUCOSE 149*  BUN 23  CREATININE 1.33*  CALCIUM 8.7*   GFR: Estimated Creatinine Clearance: 26.8 mL/min (A) (by C-G formula based on SCr of 1.33 mg/dL (H)). Liver Function Tests: Recent Labs  Lab 07/13/21 2230  AST 21  ALT 10  ALKPHOS 67  BILITOT 0.9  PROT 6.8  ALBUMIN 3.7   Recent Labs  Lab 07/13/21 2230  LIPASE 96*   No results for input(s): AMMONIA in the last 168 hours. Coagulation Profile: No results for input(s): INR, PROTIME in the last 168 hours. Cardiac Enzymes: No results for input(s):  CKTOTAL, CKMB, CKMBINDEX, TROPONINI in the last 168 hours. BNP (last 3 results) No results for input(s): PROBNP in the last 8760 hours. HbA1C: No results for input(s): HGBA1C in the last 72 hours. CBG: No results for input(s): GLUCAP in the last 168 hours. Lipid Profile: No results for input(s): CHOL, HDL, LDLCALC, TRIG, CHOLHDL, LDLDIRECT in the last 72 hours. Thyroid Function Tests: No results for input(s): TSH, T4TOTAL, FREET4, T3FREE, THYROIDAB in the last 72 hours. Anemia Panel: No results for input(s): VITAMINB12, FOLATE, FERRITIN, TIBC, IRON, RETICCTPCT in the last 72 hours. Urine analysis:    Component Value Date/Time   COLORURINE YELLOW (A) 02/18/2021 0345   APPEARANCEUR HAZY (A) 02/18/2021 0345   APPEARANCEUR Cloudy 04/24/2013 2036   LABSPEC 1.018 02/18/2021 0345   LABSPEC 1.019 04/24/2013 2036   PHURINE 5.0 02/18/2021 0345   GLUCOSEU NEGATIVE 02/18/2021 0345   GLUCOSEU Negative 04/24/2013 2036   HGBUR NEGATIVE 02/18/2021 0345   BILIRUBINUR NEGATIVE 02/18/2021 0345   BILIRUBINUR Negative 03/09/2018 1027   BILIRUBINUR Negative 04/24/2013 2036   KETONESUR NEGATIVE 02/18/2021 0345   PROTEINUR NEGATIVE 02/18/2021 0345   UROBILINOGEN 0.2 03/09/2018 1027   NITRITE NEGATIVE 02/18/2021 0345   LEUKOCYTESUR SMALL (A) 02/18/2021 0345   LEUKOCYTESUR 3+ 04/24/2013 2036    Radiological Exams on Admission: CT Abdomen Pelvis W Contrast  Result Date: 07/14/2021 CLINICAL DATA:  Generalized abdominal pain with nausea and diarrhea. EXAM: CT ABDOMEN AND PELVIS WITH CONTRAST TECHNIQUE: Multidetector CT imaging of the abdomen and pelvis was performed using the standard protocol following bolus administration of intravenous contrast. CONTRAST:  71mL OMNIPAQUE IOHEXOL 300 MG/ML  SOLN COMPARISON:  February 26, 2021 FINDINGS: Lower chest: Mild atelectasis is seen along the posterior aspect of the left lung base. There is a very small left pleural effusion. Hepatobiliary: An extensive amount of  branching air attenuation is seen within the left lobe of the liver. Involvement of the anterior and medial aspects of the adjacent portion of the right lobe is also seen. This involves numerous portal venous channels (best seen on coronal reformatted image 32, CT series 5) and represents a new finding compared to the prior study. The subcentimeter hypodense hepatic lesion seen within the right lobe of the liver on the  prior study is not clearly identified on the current exam. The gallbladder is surgically absent. The common bile duct measures 8 mm in diameter. Pancreas: Unremarkable. No pancreatic ductal dilatation or surrounding inflammatory changes. Spleen: Normal in size without focal abnormality. Adrenals/Urinary Tract: Adrenal glands are unremarkable. The right kidney is mildly decreased in size when compared to the left kidney. There is no evidence of renal calculi or hydronephrosis. 7 mm and 8 mm cystic appearing areas are noted within the left kidney. Bladder is unremarkable. Stomach/Bowel: There is a small hiatal hernia. The appendix is not clearly identified. No evidence of bowel dilatation. Pneumatosis is seen involving loops of inflamed distal small bowel. This involves predominantly loops of distal jejunum and proximal ileum. Noninflamed diverticula are seen throughout the descending and sigmoid colon. Vascular/Lymphatic: Aortic atherosclerosis. No enlarged abdominal or pelvic lymph nodes. Reproductive: Uterus and bilateral adnexa are unremarkable. Other: A stable 5.8 cm x 1.4 cm fat containing ventral hernia is seen along the midline of the mid to upper abdomen. No abdominopelvic ascites. Musculoskeletal: Marked severity multilevel degenerative changes are seen throughout the lumbar spine. IMPRESSION: 1. Findings consistent with enteritis with associated pneumatosis intestinalis and portal venous gas secondary to bowel ischemia. 2. Colonic diverticulosis. 3. Evidence of prior cholecystectomy. 4. Small  hiatal hernia. 5. Aortic atherosclerosis. Aortic Atherosclerosis (ICD10-I70.0). Electronically Signed   By: Virgina Norfolk M.D.   On: 07/14/2021 01:18   DG Chest Port 1 View  Result Date: 07/14/2021 CLINICAL DATA:  Generalized abdominal pain with nausea and diarrhea. EXAM: PORTABLE CHEST 1 VIEW COMPARISON:  February 26, 2021 FINDINGS: The lungs are hyperinflated. Diffuse, chronic appearing increased interstitial lung markings are seen. There is no evidence of acute infiltrate, pleural effusion or pneumothorax. The heart size and mediastinal contours are within normal limits. There is marked severity calcification of the aortic arch. The visualized skeletal structures are unremarkable. IMPRESSION: Stable exam without acute or active cardiopulmonary disease. Electronically Signed   By: Virgina Norfolk M.D.   On: 07/14/2021 03:11       Athena Masse MD [facility]   07/14/2021, 3:49 AM

## 2021-07-14 NOTE — Progress Notes (Signed)
PHARMACY -  BRIEF ANTIBIOTIC NOTE   Pharmacy has received consult(s) for IV abx recommendation for pcn-allergic patient w/ enteritis, ischemic bowel, pneumatosis from an ED provider.  The patient's profile has been reviewed for ht/wt/allergies/indication/available labs.    Recommendation sent to ED provider:   Pt was previously treated w/ Ceftriaxone & Metronidazole in July 2022, so recommendation would be to start with Cefepime 2 gm & Metronidazole 500 mg - can add a dose of Vancomycin 1500 mg (wt. based) to cover for possible ESBL bacteria if suspected.  If you still do not want to use Cephalosporin, alternative tx would be meropenem 1 gm & Metronidazole +/- Vancomycin.  ED provider place one-time orders for Cefepime 2 gm, Metronidazole 500 mg, and Vancomycin 1000 mg.  Further antibiotics/pharmacy consults should be ordered by admitting physician if indicated.                       Thank you, Renda Rolls, PharmD, [facility] 07/14/2021 2:21 AM  .

## 2021-07-14 NOTE — Consult Note (Signed)
Pharmacy Antibiotic Note  Jamie Hodges is a 87 y.o. female admitted on 07/14/2021 with intra-abdominal infection.  Pharmacy has been consulted for cefepime dosing.  Plan: Cefepime 2 g IV q24h per indication and renal function  Pt also on Flagyl 500 mg IV q12h  Monitor clinical picture and renal function F/U C&S, abx deescalation / LOT   Height: 5\' 7"  (170.2 cm) Weight: 68 kg (150 lb) IBW/kg (Calculated) : 61.6  Temp (24hrs), Avg:98.4 F (36.9 C), Min:97.5 F (36.4 C), Max:99.3 F (37.4 C)  Recent Labs  Lab 07/13/21 2230 07/14/21 0133 07/14/21 0339  WBC 7.4  --   --   CREATININE 1.33*  --   --   LATICACIDVEN  --  2.4* 3.4*    Estimated Creatinine Clearance: 26.8 mL/min (A) (by C-G formula based on SCr of 1.33 mg/dL (H)).    Allergies  Allergen Reactions   Codeine Sulfate     REACTION: rash/ welps   Other     Wild rice   Crestor [Rosuvastatin Calcium]     myalgia   Penicillins Hives and Rash    Has patient had a PCN reaction causing immediate rash, facial/tongue/throat swelling, SOB or lightheadedness with hypotension: Yes Has patient had a PCN reaction causing severe rash involving mucus membranes or skin necrosis: No Has patient had a PCN reaction that required hospitalization: No Has patient had a PCN reaction occurring within the last 10 years: Yes If all of the above answers are "NO", then may proceed with Cephalosporin use.    Antimicrobials this admission: 11/29 meropenem x 1 in the ED   11/29 Flagyl >>  11/29 cefepime >>   Dose adjustments this admission: N/A  Microbiology results: No cultures ordered at this time   Thank you for allowing pharmacy to be a part of this patient's care.  Darnelle Bos, PharmD 07/14/2021 10:34 AM

## 2021-07-14 NOTE — ED Notes (Signed)
Patient is resting comfortably. Denies further needs at this time.

## 2021-07-14 NOTE — ED Provider Notes (Signed)
[facility] Emergency Department Provider Note   ____________________________________________   Event Date/Time   First MD Initiated Contact with Patient 07/14/21 0125     (approximate)  I have reviewed the triage vital signs and the nursing notes.   HISTORY  Chief Complaint Abdominal Pain    HPI Jamie Hodges is a 87 y.o. female brought to the ED via EMS from [facility] with a chief complaint of generalized abdominal pain, nausea and diarrhea.  Onset of symptoms last evening.  History of ischemic colitis.  History of atrial fibrillation on Eliquis, CAD, hypertension, hyperlipidemia.  Denies fever, cough, chest pain or shortness of breath      Past Medical History:  Diagnosis Date   Atrial fibrillation ([place]) 2010   One time during hospital while sick with severe diarrhea   C. difficile colitis    3/10  Severe C. dif ---had brief atrial fib then. Cath shows some blockages but no intervention   CAD (coronary artery disease) 2010   minor blockages --no intervention indicated   Hx of colonic polyps    Hyperlipidemia    Hypertension     Patient Active Problem List   Diagnosis Date Noted   Atrial fibrillation ([place]) 02/26/2021   Stage 3b chronic kidney disease ([place]) 02/26/2021   Anemia of chronic disease    Ischemic colitis ([place]) 02/18/2021   Diarrhea    Right hip pain 03/17/2020   Aortic atherosclerosis ([place]) 07/20/2019   Arachnoid cyst 06/29/2018   Cerebrovascular disease 06/29/2018   Vertigo 06/23/2018   Preventative health care 02/17/2018   IBS (irritable bowel syndrome) 05/26/2016   Counseling regarding advanced directives 02/27/2014   Benign paroxysmal positional vertigo 05/11/2013   Chronic diarrhea 06/09/2011   Essential hypertension 09/16/2009   AF (paroxysmal atrial fibrillation) ([place]) 09/16/2009   DIVERTICULOSIS, COLON 09/16/2009   CAD (coronary artery disease) 2010    Past Surgical History:  Procedure Laterality Date    CHOLECYSTECTOMY  3/16   ERCP N/A 01/14/2015   Procedure: ENDOSCOPIC RETROGRADE CHOLANGIOPANCREATOGRAPHY (ERCP);  Surgeon: Lucilla Lame, MD;  Location: [facility] ENDOSCOPY;  Service: Endoscopy;  Laterality: N/A;   TONSILLECTOMY AND ADENOIDECTOMY      Prior to Admission medications   Medication Sig Start Date End Date Taking? Authorizing Provider  acetaminophen (TYLENOL) 325 MG tablet Take 650 mg by mouth every 6 (six) hours as needed.    [provider]  amiodarone (PACERONE) 200 MG tablet Take 1 tablet (200 mg total) by mouth daily. 05/07/21   Cuthriell, Charline Bills, PA-C  amLODipine (NORVASC) 5 MG tablet Take 1 tablet (5 mg total) by mouth daily. Patient taking differently: Take 10 mg by mouth daily. 06/03/21   [name], MD  apixaban (ELIQUIS) 5 MG TABS tablet Take 1 tablet (5 mg total) by mouth 2 (two) times daily. 03/25/21   [name], MD  cholestyramine (QUESTRAN) 4 g packet Take 1 packet (4 g total) by mouth daily. 03/30/21   [name], MD  diltiazem (CARDIZEM CD) 360 MG 24 hr capsule Take 1 capsule (360 mg total) by mouth daily. 03/25/21 04/24/21  Viviana Simpler I, MD  EPINEPHrine 0.3 mg/0.3 mL IJ SOAJ injection Inject 0.3 mg into the muscle as needed (for allergic reaction).    [provider]  meclizine (ANTIVERT) 25 MG tablet TAKE 1 TABLET BY MOUTH THREE TIMES DAILY AS NEEDED FOR  NAUSEA  OR  DIZZINESS 05/25/21   [name], MD  metoprolol tartrate (LOPRESSOR) 50 MG  tablet Take 1 tablet (50 mg total) by mouth daily. 05/07/21 05/07/22  Cuthriell, Charline Bills, PA-C    Allergies Codeine sulfate, Other, Crestor [rosuvastatin calcium], and Penicillins  Family History  Problem Relation Age of Onset   COPD Mother    Coronary artery disease Neg Hx    Diabetes Neg Hx    Cancer Neg Hx        breast or colon cancer    Social History Social History   Tobacco Use   Smoking status: Former    Types: Cigarettes    Quit date: 08/16/1978    Years since  quitting: 42.9   Smokeless tobacco: Never  Vaping Use   Vaping Use: Never used  Substance Use Topics   Alcohol use: Never   Drug use: No    Review of Systems  Constitutional: No fever/chills Eyes: No visual changes. ENT: No sore throat. Cardiovascular: Denies chest pain. Respiratory: Denies shortness of breath. Gastrointestinal: Positive for abdominal pain and nausea, no vomiting.  Positive for diarrhea.  No constipation. Genitourinary: Negative for dysuria. Musculoskeletal: Negative for back pain. Skin: Negative for rash. Neurological: Negative for headaches, focal weakness or numbness.   ____________________________________________   PHYSICAL EXAM:  VITAL SIGNS: ED Triage Vitals  Enc Vitals Group     BP 07/13/21 2227 (!) 135/44     Pulse Rate 07/13/21 2227 (!) 48     Resp 07/13/21 2227 20     Temp 07/14/21 0138 (!) 97.5 F (36.4 C)     Temp Source 07/14/21 0138 Oral     SpO2 07/13/21 2227 96 %     Weight 07/13/21 2228 150 lb (68 kg)     Height 07/13/21 2228 5\' 7"  (1.702 m)     Head Circumference --      Peak Flow --      Pain Score 07/13/21 2228 10     Pain Loc --      Pain Edu? --      Excl. in [place]? --     Constitutional: Alert and oriented.  Elderly appearing and in moderate acute distress. Eyes: Conjunctivae are normal. PERRL. EOMI. Head: Atraumatic. Nose: No congestion/rhinnorhea. Mouth/Throat: Mucous membranes are mildly dry. Neck: No stridor.   Cardiovascular: Normal rate, irregular rhythm. Grossly normal heart sounds.  Good peripheral circulation. Respiratory: Normal respiratory effort.  No retractions. Lungs CTAB. Gastrointestinal: Diffusely tender to palpation with voluntary guarding.  Mild to moderate distention. No abdominal bruits. No CVA tenderness. Musculoskeletal: No lower extremity tenderness nor edema.  No joint effusions. Neurologic:  Normal speech and language. No gross focal neurologic deficits are appreciated.  Skin:  Skin is warm, dry  and intact. No rash noted. Psychiatric: Mood and affect are normal. Speech and behavior are normal.  ____________________________________________   LABS (all labs ordered are listed, but only abnormal results are displayed)  Labs Reviewed  LIPASE, BLOOD - Abnormal; Notable for the following components:      Result Value   Lipase 96 (*)    All other components within normal limits  COMPREHENSIVE METABOLIC PANEL - Abnormal; Notable for the following components:   CO2 21 (*)    Glucose, Bld 149 (*)    Creatinine, Ser 1.33 (*)    Calcium 8.7 (*)    GFR, Estimated 38 (*)    All other components within normal limits  CBC - Abnormal; Notable for the following components:   RBC 3.60 (*)    Hemoglobin 11.6 (*)    HCT 33.9 (*)  All other components within normal limits  LACTIC ACID, PLASMA - Abnormal; Notable for the following components:   Lactic Acid, Venous 2.4 (*)    All other components within normal limits  LACTIC ACID, PLASMA - Abnormal; Notable for the following components:   Lactic Acid, Venous 3.4 (*)    All other components within normal limits  RESP PANEL BY RT-PCR (FLU A&B, COVID) ARPGX2  URINALYSIS, ROUTINE W REFLEX MICROSCOPIC  APTT  PROTIME-INR  HEPARIN LEVEL (UNFRACTIONATED)  TROPONIN I (HIGH SENSITIVITY)   ____________________________________________  EKG  ED ECG REPORT I, Jayah Balthazar J, the attending physician, personally viewed and interpreted this ECG.   Date: 07/14/2021  EKG Time: 0130  Rate: 60  Rhythm: Junctional rhythm  Axis: Normal  Intervals:none  ST&T Change: Nonspecific  ____________________________________________  RADIOLOGY I, Aryanah Enslow J, personally viewed and evaluated these images (plain radiographs) as part of my medical decision making, as well as reviewing the written report by the radiologist.  ED MD interpretation: Enteritis with associated pneumatosis intestinalis and portal venous gas secondary to mesenteric ischemia  Official  radiology report(s): CT Abdomen Pelvis W Contrast  Result Date: 07/14/2021 CLINICAL DATA:  Generalized abdominal pain with nausea and diarrhea. EXAM: CT ABDOMEN AND PELVIS WITH CONTRAST TECHNIQUE: Multidetector CT imaging of the abdomen and pelvis was performed using the standard protocol following bolus administration of intravenous contrast. CONTRAST:  65mL OMNIPAQUE IOHEXOL 300 MG/ML  SOLN COMPARISON:  February 26, 2021 FINDINGS: Lower chest: Mild atelectasis is seen along the posterior aspect of the left lung base. There is a very small left pleural effusion. Hepatobiliary: An extensive amount of branching air attenuation is seen within the left lobe of the liver. Involvement of the anterior and medial aspects of the adjacent portion of the right lobe is also seen. This involves numerous portal venous channels (best seen on coronal reformatted image 32, CT series 5) and represents a new finding compared to the prior study. The subcentimeter hypodense hepatic lesion seen within the right lobe of the liver on the prior study is not clearly identified on the current exam. The gallbladder is surgically absent. The common bile duct measures 8 mm in diameter. Pancreas: Unremarkable. No pancreatic ductal dilatation or surrounding inflammatory changes. Spleen: Normal in size without focal abnormality. Adrenals/Urinary Tract: Adrenal glands are unremarkable. The right kidney is mildly decreased in size when compared to the left kidney. There is no evidence of renal calculi or hydronephrosis. 7 mm and 8 mm cystic appearing areas are noted within the left kidney. Bladder is unremarkable. Stomach/Bowel: There is a small hiatal hernia. The appendix is not clearly identified. No evidence of bowel dilatation. Pneumatosis is seen involving loops of inflamed distal small bowel. This involves predominantly loops of distal jejunum and proximal ileum. Noninflamed diverticula are seen throughout the descending and sigmoid colon.  Vascular/Lymphatic: Aortic atherosclerosis. No enlarged abdominal or pelvic lymph nodes. Reproductive: Uterus and bilateral adnexa are unremarkable. Other: A stable 5.8 cm x 1.4 cm fat containing ventral hernia is seen along the midline of the mid to upper abdomen. No abdominopelvic ascites. Musculoskeletal: Marked severity multilevel degenerative changes are seen throughout the lumbar spine. IMPRESSION: 1. Findings consistent with enteritis with associated pneumatosis intestinalis and portal venous gas secondary to bowel ischemia. 2. Colonic diverticulosis. 3. Evidence of prior cholecystectomy. 4. Small hiatal hernia. 5. Aortic atherosclerosis. Aortic Atherosclerosis (ICD10-I70.0). Electronically Signed   By: Virgina Norfolk M.D.   On: 07/14/2021 01:18   DG Chest Port 1 View  Result Date: 07/14/2021  CLINICAL DATA:  Generalized abdominal pain with nausea and diarrhea. EXAM: PORTABLE CHEST 1 VIEW COMPARISON:  February 26, 2021 FINDINGS: The lungs are hyperinflated. Diffuse, chronic appearing increased interstitial lung markings are seen. There is no evidence of acute infiltrate, pleural effusion or pneumothorax. The heart size and mediastinal contours are within normal limits. There is marked severity calcification of the aortic arch. The visualized skeletal structures are unremarkable. IMPRESSION: Stable exam without acute or active cardiopulmonary disease. Electronically Signed   By: Virgina Norfolk M.D.   On: 07/14/2021 03:11    ____________________________________________   PROCEDURES  Procedure(s) performed (including Critical Care):  .1-3 Lead EKG Interpretation Performed by: Paulette Blanch, MD Authorized by: Paulette Blanch, MD     Interpretation: abnormal     ECG rate:  60   ECG rate assessment: normal     Rhythm: other rhythm     Ectopy: none     Conduction: normal   Comments:     Patient placed on cardiac monitor to evaluate for arrhythmias  CRITICAL CARE Performed by: Paulette Blanch   Total critical care time: 45 minutes  Critical care time was exclusive of separately billable procedures and treating other patients.  Critical care was necessary to treat or prevent imminent or life-threatening deterioration.  Critical care was time spent personally by me on the following activities: development of treatment plan with patient and/or surrogate as well as nursing, discussions with consultants, evaluation of patient's response to treatment, examination of patient, obtaining history from patient or surrogate, ordering and performing treatments and interventions, ordering and review of laboratory studies, ordering and review of radiographic studies, pulse oximetry and re-evaluation of patient's condition.  ____________________________________________   INITIAL IMPRESSION / ASSESSMENT AND PLAN / ED COURSE  As part of my medical decision making, I reviewed the following data within the [place] History obtained from family, Nursing notes reviewed and incorporated, Labs reviewed, EKG interpreted, Old chart reviewed, Radiograph reviewed, Discussed with admitting physician, and Notes from prior ED visits     87 year old female presenting with abdominal pain, nausea and diarrhea. Differential diagnosis includes, but is not limited to, ovarian cyst, ovarian torsion, acute appendicitis, diverticulitis, urinary tract infection/pyelonephritis, endometriosis, bowel obstruction, colitis, renal colic, gastroenteritis, hernia, fibroids, etc.   Laboratory results demonstrate normal WBC, elevated creatinine and lipase.  Will check lactic acid, UA.  CT scan concerning for enteritis with pneumatosis intestinalis secondary to mesenteric ischemia.  Patient updated of all test results.  She brings her DNR form and reaffirms her wish to be DNR/DNI.  Additionally, she declines any surgical intervention.  Will discuss case with surgery on-call for further  recommendations.  Clinical Course as of 07/14/21 0431  Tue Jul 14, 2021  0154 Discussed case with Dr. Hampton Abbot from general surgery who recommends conservative and supportive care in light of patient's wishes for no surgery: IV fluids, n.p.o. x24 hours to see how she does over the next day or 2. [JS]  0212 Updated patient's son Dr. Janne Napoleon via telephone.  Will discuss with hospitalist services for admission. [JS]    Clinical Course User Index [JS] Paulette Blanch, MD     ____________________________________________   FINAL CLINICAL IMPRESSION(S) / ED DIAGNOSES  Final diagnoses:  Generalized abdominal pain  Nausea  Diarrhea, unspecified type  Pneumatosis intestinalis  Ischemic bowel disease [facility])     ED Discharge Orders     None        Note:  This document was prepared  using Systems analyst and may include unintentional dictation errors.    Paulette Blanch, MD 07/14/21 [phone]

## 2021-07-14 NOTE — ED Notes (Signed)
Nikki RN aware of assigned bed 

## 2021-07-14 NOTE — Consult Note (Signed)
[facility] SURGICAL CONSULTATION NOTE (initial) - cpt: 17001   HISTORY OF PRESENT ILLNESS (HPI):  87 y.o. female presented to [facility] ED overnight for evaluation of abdominal pain. Patient reports the acute onset of generalized abdominal pain. This was sharp and severe in nature and localized throughout her entire abdomen. This was accompanied by nausea, emesis, and diarrhea. No fever, chills, SOB, CP. She has a history of similar in July of this year. We saw her at that time as well and she continued to refuse surgery. She thankfully recovered from this episode with conservative measures only and was discharged back to skilled nursing. She has had no issues since. Previous intra-abdominal surgeries include cholecystectomy. Work up in the ED revealed a normal WBC to 7.4K, sCr - 1.33, no significant electrolyte derangements, lactic acidosis to 2.4 (now 3.4). CT Abdomen/Pelvis was concerning for pneumatosis to the small bowel with significant portal venous gas concerning for ischemic enteritis. EDP again discussed this with her and she is refusing surgery, which she again reiterates to me. She was admitted to the hospitalist and started on Meropenem.   Surgery is consulted by emergency medicine physician Dr. Lurline Hare, MD in this context for evaluation and management of ischemic enteritis.   PAST MEDICAL HISTORY (PMH):  Past Medical History:  Diagnosis Date   Atrial fibrillation ([place]) 2010   One time during hospital while sick with severe diarrhea   C. difficile colitis    3/10  Severe C. dif ---had brief atrial fib then. Cath shows some blockages but no intervention   CAD (coronary artery disease) 2010   minor blockages --no intervention indicated   Hx of colonic polyps    Hyperlipidemia    Hypertension      PAST SURGICAL HISTORY ([place]):  Past Surgical History:  Procedure Laterality Date   CHOLECYSTECTOMY  3/16   ERCP N/A 01/14/2015   Procedure: ENDOSCOPIC RETROGRADE  CHOLANGIOPANCREATOGRAPHY (ERCP);  Surgeon: Lucilla Lame, MD;  Location: [facility] ENDOSCOPY;  Service: Endoscopy;  Laterality: N/A;   TONSILLECTOMY AND ADENOIDECTOMY       MEDICATIONS:  Prior to Admission medications   Medication Sig Start Date End Date Taking? Authorizing Provider  acetaminophen (TYLENOL) 325 MG tablet Take 650 mg by mouth every 6 (six) hours as needed.    [provider]  amiodarone (PACERONE) 200 MG tablet Take 1 tablet (200 mg total) by mouth daily. 05/07/21   Cuthriell, Charline Bills, PA-C  amLODipine (NORVASC) 5 MG tablet Take 1 tablet (5 mg total) by mouth daily. Patient taking differently: Take 10 mg by mouth daily. 06/03/21   [name], MD  apixaban (ELIQUIS) 5 MG TABS tablet Take 1 tablet (5 mg total) by mouth 2 (two) times daily. 03/25/21   [name], MD  cholestyramine (QUESTRAN) 4 g packet Take 1 packet (4 g total) by mouth daily. 03/30/21   [name], MD  diltiazem (CARDIZEM CD) 360 MG 24 hr capsule Take 1 capsule (360 mg total) by mouth daily. 03/25/21 04/24/21  Viviana Simpler I, MD  EPINEPHrine 0.3 mg/0.3 mL IJ SOAJ injection Inject 0.3 mg into the muscle as needed (for allergic reaction).    [provider]  meclizine (ANTIVERT) 25 MG tablet TAKE 1 TABLET BY MOUTH THREE TIMES DAILY AS NEEDED FOR  NAUSEA  OR  DIZZINESS 05/25/21   [name], MD  metoprolol tartrate (LOPRESSOR) 50 MG tablet Take 1 tablet (50 mg total) by mouth daily. 05/07/21 05/07/22  Cuthriell, Charline Bills, PA-C  ALLERGIES:  Allergies  Allergen Reactions   Codeine Sulfate     REACTION: rash/ welps   Other     Wild rice   Crestor [Rosuvastatin Calcium]     myalgia   Penicillins Hives and Rash    Has patient had a PCN reaction causing immediate rash, facial/tongue/throat swelling, SOB or lightheadedness with hypotension: Yes Has patient had a PCN reaction causing severe rash involving mucus membranes or skin necrosis: No Has patient had a PCN reaction  that required hospitalization: No Has patient had a PCN reaction occurring within the last 10 years: Yes If all of the above answers are "NO", then may proceed with Cephalosporin use.     SOCIAL HISTORY:  Social History   Socioeconomic History   Marital status: Widowed    Spouse name: Not on file   Number of children: 3   Years of education: Not on file   Highest education level: Not on file  Occupational History   Occupation: retired 1st grade teacher  Tobacco Use   Smoking status: Former    Types: Cigarettes    Quit date: 08/16/1978    Years since quitting: 42.9   Smokeless tobacco: Never  Vaping Use   Vaping Use: Never used  Substance and Sexual Activity   Alcohol use: Never   Drug use: No   Sexual activity: Not on file  Other Topics Concern   Not on file  Social History Narrative   Has living will.    Son Mikki Santee (MD) to make health care decisions.--then son Laurey Arrow   Has DNR order in past and requests again--done   No tube feeds if cognitively unaware         Social Determinants of Health   Financial Resource Strain: Not on file  Food Insecurity: Not on file  Transportation Needs: Not on file  Physical Activity: Not on file  Stress: Not on file  Social Connections: Not on file  Intimate Partner Violence: Not on file     FAMILY HISTORY:  Family History  Problem Relation Age of Onset   COPD Mother    Coronary artery disease Neg Hx    Diabetes Neg Hx    Cancer Neg Hx        breast or colon cancer      REVIEW OF SYSTEMS:  Review of Systems  Constitutional:  Negative for chills and fever.  HENT:  Negative for congestion and sore throat.   Respiratory:  Negative for cough and shortness of breath.   Cardiovascular:  Negative for chest pain and palpitations.  Gastrointestinal:  Positive for abdominal pain, diarrhea, nausea and vomiting.  Genitourinary:  Negative for dysuria and urgency.  All other systems reviewed and are negative.  VITAL SIGNS:  Temp:   [97.5 F (36.4 C)] 97.5 F (36.4 C) (11/29 0138) Pulse Rate:  [48-85] 83 (11/29 0800) Resp:  [16-24] 18 (11/29 0800) BP: (87-173)/(44-130) 137/51 (11/29 0800) SpO2:  [92 %-97 %] 92 % (11/29 0800) Weight:  [68 kg] 68 kg (11/28 2228)     Height: 5\' 7"  (170.2 cm) Weight: 68 kg BMI (Calculated): 23.49   INTAKE/OUTPUT:  No intake/output data recorded.  PHYSICAL EXAM:  Physical Exam Vitals and nursing note reviewed. Exam conducted with a chaperone present.  Constitutional:      Appearance: She is well-developed. She is not ill-appearing.     Comments: Patient resting bed, appears uncomfortable  HENT:     Head: Normocephalic and atraumatic.  Eyes:  General: No scleral icterus.    Extraocular Movements: Extraocular movements intact.  Cardiovascular:     Rate and Rhythm: Normal rate and regular rhythm.     Heart sounds: Normal heart sounds. No murmur heard. Pulmonary:     Effort: Pulmonary effort is normal. No respiratory distress.     Breath sounds: Normal breath sounds.  Abdominal:     General: A surgical scar is present. There is distension.     Palpations: Abdomen is soft.     Tenderness: There is generalized abdominal tenderness. There is rebound. There is no guarding.     Comments: Soft, she is mildly distended, she is diffusely tender to light palpation throughout her entire abdomen, I am concerned she is peritonitic  Genitourinary:    Comments: Deferred Skin:    General: Skin is warm and dry.     Coloration: Skin is not jaundiced or pale.  Neurological:     General: No focal deficit present.     Mental Status: She is alert and oriented to person, place, and time.  Psychiatric:        Mood and Affect: Mood normal.        Behavior: Behavior normal.     Labs:  CBC Latest Ref Rng & Units 07/13/2021 03/30/2021 02/27/2021  WBC 4.0 - 10.5 K/uL 7.4 5.9 4.7  Hemoglobin 12.0 - 15.0 g/dL 11.6(L) 11.8(L) 10.7(L)  Hematocrit 36.0 - 46.0 % 33.9(L) 34.8(L) 31.9(L)  Platelets 150  - 400 K/uL 187 216.0 198   CMP Latest Ref Rng & Units 07/13/2021 03/30/2021 02/27/2021  Glucose 70 - 99 mg/dL 149(H) 120(H) 144(H)  BUN 8 - 23 mg/dL 23 22 8   Creatinine 0.44 - 1.00 mg/dL 1.33(H) 1.23(H) 1.01(H)  Sodium 135 - 145 mmol/L 139 142 139  Potassium 3.5 - 5.1 mmol/L 3.6 4.7 3.6  Chloride 98 - 111 mmol/L 110 108 109  CO2 22 - 32 mmol/L 21(L) 26 23  Calcium 8.9 - 10.3 mg/dL 8.7(L) 8.7 8.2(L)  Total Protein 6.5 - 8.1 g/dL 6.8 6.4 -  Total Bilirubin 0.3 - 1.2 mg/dL 0.9 0.8 -  Alkaline Phos 38 - 126 U/L 67 78 -  AST 15 - 41 U/L 21 14 -  ALT 0 - 44 U/L 10 12 -     Imaging studies:   CT Abdomen/Pelvis (07/14/2021) personally reviewed showing pneumatosis of the small bowel with associated portal venous gas concerning for ischemic bowel, and radiologist report reviewed below:  IMPRESSION: 1. Findings consistent with enteritis with associated pneumatosis intestinalis and portal venous gas secondary to bowel ischemia. 2. Colonic diverticulosis. 3. Evidence of prior cholecystectomy. 4. Small hiatal hernia. 5. Aortic atherosclerosis.   Assessment/Plan: (ICD-10's: K65.89) 87 y.o. female with worsening lactic acidosis and diffuse abdominal pain found to have pneumatosis and portal venous gas concerning for ischemic enteritis.   - Similar to her presentation in July, I spent time this morning reviewing her presentation, work up, and examination findings with her in detail. She is very familiar given her similar presentation in July of this year. Again, I reviewed that the typical recommendation in this scenario would be surgical intervention to include laparotomy and bowel resection. I reviewed risks of this procedure and potential complications, including but not limited to, prolonged ventilation, need for ICU stay, debilitation, and even death. I also discussed that if she chooses to forgo intervention this may likely be a fatal insult. She is again very understanding of this and continues  to elect to forgo any surgical  intervention. She was again offered to ask questions multiple times, but her main focus is just managing the pain and staying comfortable. She is agreeable with continuing conservative measures. If she continues to decline, or fail to improve, we will transition to comfort care.    - Appreciate medicine admission - Recommend continued NPO for now given bowel rest; okay with small sips of water/ice for comfort - Continue IV Abx (Meropenem) - Monitor abdominal examination +/- serial KUBs - Monitor lactic acidosis; worsening - Pain control prn; antiemetics prn    - Further management per primary service; we will follow   All of the above findings and recommendations were discussed with the patient, and all of patient's questions were answered to her expressed satisfaction.  Thank you for the opportunity to participate in this patient's care.   -- Edison Simon, PA-C [facility] 07/14/2021, 8:07 AM [phone] M-F: 7am - 4pm

## 2021-07-14 NOTE — Progress Notes (Signed)
[place] for heparin infusion Indication: atrial fibrillation  Allergies  Allergen Reactions   Codeine Sulfate     REACTION: rash/ welps   Other     Wild rice   Crestor [Rosuvastatin Calcium]     myalgia   Penicillins Hives and Rash    Has patient had a PCN reaction causing immediate rash, facial/tongue/throat swelling, SOB or lightheadedness with hypotension: Yes Has patient had a PCN reaction causing severe rash involving mucus membranes or skin necrosis: No Has patient had a PCN reaction that required hospitalization: No Has patient had a PCN reaction occurring within the last 10 years: Yes If all of the above answers are "NO", then may proceed with Cephalosporin use.    Patient Measurements: Height: 5\' 7"  (170.2 cm) Weight: 68 kg (150 lb) IBW/kg (Calculated) : 61.6 Heparin Dosing Weight: 68 kg  Vital Signs: Temp: 97.5 F (36.4 C) (11/29 0138) Temp Source: Oral (11/29 0138) BP: 162/56 (11/29 0508) Pulse Rate: 79 (11/29 0508)  Labs: Recent Labs    07/13/21 2230  HGB 11.6*  HCT 33.9*  PLT 187  CREATININE 1.33*  TROPONINIHS 6    Estimated Creatinine Clearance: 26.8 mL/min (A) (by C-G formula based on SCr of 1.33 mg/dL (H)).   Medical History: Past Medical History:  Diagnosis Date   Atrial fibrillation ([place]) 2010   One time during hospital while sick with severe diarrhea   C. difficile colitis    3/10  Severe C. dif ---had brief atrial fib then. Cath shows some blockages but no intervention   CAD (coronary artery disease) 2010   minor blockages --no intervention indicated   Hx of colonic polyps    Hyperlipidemia    Hypertension     Medications:  PTA Med:  Eliquis 5 mg BID  Assessment: Pt is 87 yo female with history of A. Fib, being transitioned to heparin at this time.  Goal of Therapy:  Heparin level 0.3-0.7 units/ml aPTT 66-102 seconds Monitor platelets by anticoagulation protocol: Yes   Plan:  Ordered  BL labs. No initial bolus per MD request. Start heparin infusion at 900 units/hr Will follow aPTT until correlation w/ HL is confirmed Will check aPTT in 8 hr after start of infusion. CBC and HL daily while on heparin.  Renda Rolls, PharmD, [facility] 07/14/2021 5:15 AM

## 2021-07-14 NOTE — Progress Notes (Signed)
Pharmacy Antibiotic Note  Jamie Hodges is a 87 y.o. female admitted on 07/14/2021 with intra-abdominal infection.  Pharmacy has been consulted for meropenem dosing.  Plan: Will start pt on Meropenem 500 gm q12h, per indication and current renal fxn. Pharmacy will continue to follow and adjust dose as warranted.  Height: 5\' 7"  (170.2 cm) Weight: 68 kg (150 lb) IBW/kg (Calculated) : 61.6  Temp (24hrs), Avg:97.5 F (36.4 C), Min:97.5 F (36.4 C), Max:97.5 F (36.4 C)  Recent Labs  Lab 07/13/21 2230 07/14/21 0133 07/14/21 0339  WBC 7.4  --   --   CREATININE 1.33*  --   --   LATICACIDVEN  --  2.4* 3.4*    Estimated Creatinine Clearance: 26.8 mL/min (A) (by C-G formula based on SCr of 1.33 mg/dL (H)).    Allergies  Allergen Reactions   Codeine Sulfate     REACTION: rash/ welps   Other     Wild rice   Crestor [Rosuvastatin Calcium]     myalgia   Penicillins Hives and Rash    Has patient had a PCN reaction causing immediate rash, facial/tongue/throat swelling, SOB or lightheadedness with hypotension: Yes Has patient had a PCN reaction causing severe rash involving mucus membranes or skin necrosis: No Has patient had a PCN reaction that required hospitalization: No Has patient had a PCN reaction occurring within the last 10 years: Yes If all of the above answers are "NO", then may proceed with Cephalosporin use.    Antimicrobials this admission: 11/29 Meropenem >>  11/29  Flagyl >> 11/29 Vancomycin x 1   Microbiology results: No labs currently ordered or pending at this time.  Thank you for allowing pharmacy to be a part of this patient's care.  Renda Rolls, PharmD, [facility] 07/14/2021 5:28 AM

## 2021-07-15 DIAGNOSIS — K6389 Other specified diseases of intestine: Secondary | ICD-10-CM | POA: Diagnosis not present

## 2021-07-15 DIAGNOSIS — Z66 Do not resuscitate: Secondary | ICD-10-CM | POA: Diagnosis not present

## 2021-07-15 DIAGNOSIS — Z7189 Other specified counseling: Secondary | ICD-10-CM

## 2021-07-15 DIAGNOSIS — K559 Vascular disorder of intestine, unspecified: Secondary | ICD-10-CM | POA: Diagnosis not present

## 2021-07-15 DIAGNOSIS — Z515 Encounter for palliative care: Secondary | ICD-10-CM

## 2021-07-15 LAB — CBC
HCT: 39.2 % (ref 36.0–46.0)
Hemoglobin: 13.2 g/dL (ref 12.0–15.0)
MCH: 31.7 pg (ref 26.0–34.0)
MCHC: 33.7 g/dL (ref 30.0–36.0)
MCV: 94 fL (ref 80.0–100.0)
Platelets: 189 10*3/uL (ref 150–400)
RBC: 4.17 MIL/uL (ref 3.87–5.11)
RDW: 14.4 % (ref 11.5–15.5)
WBC: 13.4 10*3/uL — ABNORMAL HIGH (ref 4.0–10.5)
nRBC: 0 % (ref 0.0–0.2)

## 2021-07-15 LAB — BASIC METABOLIC PANEL
Anion gap: 7 (ref 5–15)
BUN: 45 mg/dL — ABNORMAL HIGH (ref 8–23)
CO2: 19 mmol/L — ABNORMAL LOW (ref 22–32)
Calcium: 8.4 mg/dL — ABNORMAL LOW (ref 8.9–10.3)
Chloride: 109 mmol/L (ref 98–111)
Creatinine, Ser: 2.44 mg/dL — ABNORMAL HIGH (ref 0.44–1.00)
GFR, Estimated: 18 mL/min — ABNORMAL LOW (ref >60)
Glucose, Bld: 142 mg/dL — ABNORMAL HIGH (ref 70–99)
Potassium: 4.7 mmol/L (ref 3.5–5.1)
Sodium: 135 mmol/L (ref 135–145)

## 2021-07-15 LAB — MAGNESIUM: Magnesium: 1.8 mg/dL (ref 1.7–2.4)

## 2021-07-15 MED ORDER — METOPROLOL TARTRATE 50 MG PO TABS
50.0000 mg | ORAL_TABLET | Freq: Two times a day (BID) | ORAL | Status: DC
  Administered 2021-07-15 – 2021-07-25 (×21): 50 mg via ORAL
  Filled 2021-07-15 (×21): qty 1

## 2021-07-15 MED ORDER — APIXABAN 2.5 MG PO TABS
2.5000 mg | ORAL_TABLET | Freq: Two times a day (BID) | ORAL | Status: DC
  Administered 2021-07-15 – 2021-07-20 (×11): 2.5 mg via ORAL
  Filled 2021-07-15 (×10): qty 1

## 2021-07-15 MED ORDER — MORPHINE SULFATE (PF) 2 MG/ML IV SOLN
2.0000 mg | INTRAVENOUS | Status: DC | PRN
  Administered 2021-07-20: 2 mg via INTRAVENOUS
  Filled 2021-07-15: qty 1

## 2021-07-15 MED ORDER — ACETAMINOPHEN 500 MG PO TABS
1000.0000 mg | ORAL_TABLET | Freq: Four times a day (QID) | ORAL | Status: DC | PRN
  Filled 2021-07-15: qty 2

## 2021-07-15 MED ORDER — HYDROMORPHONE HCL 1 MG/ML IJ SOLN
0.5000 mg | INTRAMUSCULAR | Status: DC | PRN
  Administered 2021-07-20: 0.5 mg via INTRAVENOUS
  Filled 2021-07-15: qty 0.5

## 2021-07-15 MED ORDER — AMIODARONE HCL 200 MG PO TABS
200.0000 mg | ORAL_TABLET | Freq: Every day | ORAL | Status: DC
  Administered 2021-07-15 – 2021-07-25 (×11): 200 mg via ORAL
  Filled 2021-07-15 (×11): qty 1

## 2021-07-15 MED ORDER — SODIUM CHLORIDE 0.9 % IV SOLN
2.0000 g | INTRAVENOUS | Status: DC
Start: 2021-07-16 — End: 2021-07-24
  Administered 2021-07-16 – 2021-07-24 (×9): 2 g via INTRAVENOUS
  Filled 2021-07-15 (×2): qty 20
  Filled 2021-07-15 (×5): qty 2
  Filled 2021-07-15: qty 20
  Filled 2021-07-15 (×2): qty 2

## 2021-07-15 MED ORDER — DILTIAZEM HCL ER COATED BEADS 120 MG PO CP24
360.0000 mg | ORAL_CAPSULE | Freq: Every day | ORAL | Status: DC
  Administered 2021-07-15 – 2021-07-25 (×11): 360 mg via ORAL
  Filled 2021-07-15 (×11): qty 3

## 2021-07-15 NOTE — TOC Initial Note (Signed)
Transition of Care [facility]) - Initial/Assessment Note    Patient Details  Name: Jamie Hodges MRN: [id number] Date of Birth: [birth year]  [facility] CM/SW Contact:    Candie Chroman, LCSW Phone Number: 07/15/2021, 1:51 PM  Clinical Narrative:  CSW met with patient. No supports at bedside. CSW introduced role and explained that discharge planning would be discussed. Patient confirmed she is from [place]. No home health prior to admission. She gave CSW permission to call her daughter-in-law Jamie Hodges. Jamie Hodges asked that therapy be ordered so that she can start working with them. She reports patient gets deconditioned quickly. Asked MD for orders.              Expected Discharge Plan: Home/Self Care Barriers to Discharge: Continued Medical Work up   Patient Goals and CMS Choice        Expected Discharge Plan and Services Expected Discharge Plan: Home/Self Care     Post Acute Care Choice:  (TBD) Living arrangements for the past 2 months: [place]                                      Prior Living Arrangements/Services Living arrangements for the past 2 months: [place] Lives with:: Self Patient language and need for interpreter reviewed:: Yes Do you feel safe going back to the place where you live?: Yes      Need for Family Participation in Patient Care: Yes (Comment)     Criminal Activity/Legal Involvement Pertinent to Current Situation/Hospitalization: No - Comment as needed  Activities of Daily Living Home Assistive Devices/Equipment: Cane (specify quad or straight), Blood pressure cuff, Eyeglasses ADL Screening (condition at time of admission) Patient's cognitive ability adequate to safely complete daily activities?: Yes Is the patient deaf or have difficulty hearing?: No Does the patient have difficulty seeing, even when wearing glasses/contacts?: No Does the patient have difficulty concentrating, remembering,  or making decisions?: No Patient able to express need for assistance with ADLs?: Yes Does the patient have difficulty dressing or bathing?: No Independently performs ADLs?: Yes (appropriate for developmental age) Does the patient have difficulty walking or climbing stairs?: No Weakness of Legs: None Weakness of Arms/Hands: None  Permission Sought/Granted Permission sought to share information with : Facility Sport and exercise psychologist, Family Supports Permission granted to share information with : Yes, Verbal Permission Granted  Share Information with NAME: Jamie Hodges  Permission granted to share info w AGENCY: [place] granted to share info w Relationship: Daughter-in-law  Permission granted to share info w Contact Information: [phone]  Emotional Assessment Appearance:: Appears stated age Attitude/Demeanor/Rapport: Engaged, Gracious Affect (typically observed): Accepting, Appropriate, Calm, Pleasant Orientation: : Oriented to Self, Oriented to Place, Oriented to  Time, Oriented to Situation Alcohol / Substance Use: Not Applicable Psych Involvement: No (comment)  Admission diagnosis:  Ischemic bowel disease ([place]) [K55.9] Nausea [R11.0] Generalized abdominal pain [R10.84] Pneumatosis intestinalis [K63.89] Ischemic colitis ([place]) [K55.9] Diarrhea, unspecified type [R19.7] Patient Active Problem List   Diagnosis Date Noted   Atrial fibrillation ([place]) 02/26/2021   Stage 3b chronic kidney disease ([place]) 02/26/2021   Anemia of chronic disease    Ischemic enteritis ([place]) 02/18/2021   Diarrhea    Right hip pain 03/17/2020   Aortic atherosclerosis ([place]) 07/20/2019   Arachnoid cyst 06/29/2018   Cerebrovascular disease 06/29/2018   Vertigo 06/23/2018   Preventative health care 02/17/2018  IBS (irritable bowel syndrome) 05/26/2016   Counseling regarding advanced directives 02/27/2014   Benign paroxysmal positional vertigo 05/11/2013   Chronic diarrhea 06/09/2011    Essential hypertension 09/16/2009   AF (paroxysmal atrial fibrillation) ([place]) 09/16/2009   DIVERTICULOSIS, COLON 09/16/2009   CAD (coronary artery disease) 2010   PCP:  [name], MD Pharmacy:   [facility] - [address] [place] [address] Phone: [phone] Fax: [phone]  [place] [address], [place] - [place] [address] Phone: [phone] Fax: [place], [place] [place] [place] [address] Phone: [phone] Fax: [phone]  [place], [place] - [place] [address] Phone: [phone] Fax: [phone]     Social Determinants of Health (SDOH) Interventions    Readmission Risk Interventions No flowsheet data found.

## 2021-07-15 NOTE — Consult Note (Signed)
Consultation Note Date: 07/15/2021   Patient Name: Jamie Hodges  DOB: [birth year]  MRN: [id number]  Age / Sex: 87 y.o., female  PCP: [name], MD Referring Physician: Enzo Bi, MD  Reason for Consultation: Establishing goals of care  HPI/Patient Profile: 87 y.o. female  with past medical history of paroxysmal atrial fibrillation, HTN, recurrent episodes of acute colonic ischemia, CKD 3B, and anemia of chronic disease admitted on 07/14/2021 with generalized abdominal pain, nausea and diarrhea. CT abdomen and pelvis with contrast with findings consistent with enteritis, pneumatosis and portal vein gas secondary to bowel ischemia among other findings. Patient not interested in surgical intervention. Patient on IV fluids and IV antibiotics. PMT consulted to discuss [place].   Clinical Assessment and Goals of Care: I have reviewed medical records including EPIC notes, labs and imaging, assessed the patient and then met with patient  to discuss diagnosis prognosis, GOC, EOL wishes, disposition and options.  I introduced Palliative Medicine as specialized medical care for people living with serious illness. It focuses on providing relief from the symptoms and stress of a serious illness. The goal is to improve quality of life for both the patient and the family.  Patient tells me she lives at [facility]. As far as functional and nutritional status, she tells me she was able to independently care for herself.    We discussed patient's current illness and what it means in the larger context of patient's on-going co-morbidities. Patient understands illness and continues to decline surgical intervention. She understands she is high risk for acute decline - explaining potential course of illness in detail.   I attempted to elicit values and goals of care important to the patient.    The difference between aggressive medical intervention and comfort  care was considered in light of the patient's goals of care. For now, patient would like to continues with current interventions and allow time for outcomes.   If patient became unable to make decisions for herself she would want her son Marshe Shrestha to make decisions for her.   Discussed with patient the importance of continued conversation with family and the medical providers regarding overall plan of care and treatment options, ensuring decisions are within the context of the patient's values and GOCs.    We discussed her symptoms - she feels that her pain and nausea are better today though she is not completely comfortable. She is not interested in changing current medication regimen or scheduling medications for symptom management at this time.   Questions and concerns were addressed. The family was encouraged to call with questions or concerns.   Primary Decision Maker PATIENT - son Mikki Santee if patient unable   SUMMARY OF RECOMMENDATIONS   - continues to decline surgery - continue current interventions and allow time for outcomes - she understands severity of condition - no changes made to symptom management at this time - patient feels symptoms are well controlled with current regimen - patient would like medical team to keep her son Mikki Santee updated  Code Status/Advance Care Planning: DNR      Primary Diagnoses: Present on Admission:  AF (paroxysmal atrial fibrillation) ([place])   I have reviewed the medical record, interviewed the patient and family, and examined the patient. The following aspects are pertinent.  Past Medical History:  Diagnosis Date   Atrial fibrillation ([place]) 2010   One time during hospital while sick with severe diarrhea   C. difficile colitis    3/10  Severe C.  dif ---had brief atrial fib then. Cath shows some blockages but no intervention   CAD (coronary artery disease) 2010   minor blockages --no intervention indicated   Hx of colonic polyps    Hyperlipidemia     Hypertension    Social History   Socioeconomic History   Marital status: Widowed    Spouse name: Not on file   Number of children: 3   Years of education: Not on file   Highest education level: Not on file  Occupational History   Occupation: retired 1st grade teacher  Tobacco Use   Smoking status: Former    Types: Cigarettes    Quit date: 08/16/1978    Years since quitting: 42.9   Smokeless tobacco: Never  Vaping Use   Vaping Use: Never used  Substance and Sexual Activity   Alcohol use: Never   Drug use: No   Sexual activity: Not on file  Other Topics Concern   Not on file  Social History Narrative   Has living will.    Son Mikki Santee (MD) to make health care decisions.--then son Laurey Arrow   Has DNR order in past and requests again--done   No tube feeds if cognitively unaware         Social Determinants of Health   Financial Resource Strain: Not on file  Food Insecurity: Not on file  Transportation Needs: Not on file  Physical Activity: Not on file  Stress: Not on file  Social Connections: Not on file   Family History  Problem Relation Age of Onset   COPD Mother    Coronary artery disease Neg Hx    Diabetes Neg Hx    Cancer Neg Hx        breast or colon cancer   Scheduled Meds:  amiodarone  200 mg Oral Daily   apixaban  2.5 mg Oral BID   diltiazem  360 mg Oral Daily   metoprolol tartrate  50 mg Oral BID   Continuous Infusions:  [START ON 07/16/2021] cefTRIAXone (ROCEPHIN)  IV     lactated ringers 75 mL/hr at 07/15/21 0845   metronidazole 500 mg (07/15/21 0842)   PRN Meds:.acetaminophen, HYDROmorphone (DILAUDID) injection, metoprolol tartrate, morphine injection, ondansetron **OR** ondansetron (ZOFRAN) IV Allergies  Allergen Reactions   Codeine Sulfate     REACTION: rash/ welps   Other     Wild rice   Crestor [Rosuvastatin Calcium]     myalgia   Penicillins Hives and Rash    Has patient had a PCN reaction causing immediate rash, facial/tongue/throat  swelling, SOB or lightheadedness with hypotension: Yes Has patient had a PCN reaction causing severe rash involving mucus membranes or skin necrosis: No Has patient had a PCN reaction that required hospitalization: No Has patient had a PCN reaction occurring within the last 10 years: Yes If all of the above answers are "NO", then may proceed with Cephalosporin use.   Review of Systems  Constitutional:  Positive for activity change and appetite change.  Gastrointestinal:  Positive for abdominal pain and nausea.   Physical Exam Constitutional:      Appearance: She is ill-appearing.  Pulmonary:     Effort: Pulmonary effort is normal.  Skin:    General: Skin is warm and dry.  Neurological:     Mental Status: She is alert and oriented to person, place, and time.  Psychiatric:        Mood and Affect: Mood normal.        Behavior: Behavior normal.  Vital Signs: BP (!) 160/63 (BP Location: Right Arm)   Pulse 99   Temp 98.6 F (37 C) (Oral)   Resp 16   Ht _0  (1.702 m)   Wt 68 kg   SpO2 94%   BMI 23.49 kg/m  Pain Scale: 0-10   Pain Score: 8    SpO2: SpO2: 94 % O2 Device:SpO2: 94 % O2 Flow Rate: .   IO: Intake/output summary:  Intake/Output Summary (Last 24 hours) at 07/15/2021 1515 Last data filed at 07/15/2021 0645 Gross per 24 hour  Intake --  Output 100 ml  Net -100 ml    LBM: Last BM Date: 07/12/21 Baseline Weight: Weight: 68 kg Most recent weight: Weight: 68 kg     Palliative Assessment/Data: PPS 20% r/t PO intake     Time Total: 55 minutes Greater than 50%  of this time was spent counseling and coordinating care related to the above assessment and plan.  Juel Burrow, DNP, AGNP-C Palliative Medicine Team [phone] Pager: [phone]

## 2021-07-15 NOTE — Progress Notes (Signed)
[facility] SURGICAL PROGRESS NOTE (cpt [phone])  Hospital Day(s): 1.   Interval History: Patient seen and examined, no acute events or new complaints overnight. Patient reports she continues to have right sided abdominal pain. No fever, chills, emesis. She does now have a leukocytosis this morning with WBC 13.4K. BMP is pending. She continues on Cefepime and Flagyl. She has been NPO aside from sips of water/ice for comfort.   She continues to refuse surgical interventions.   Review of Systems:  Constitutional: denies fever, chills  HEENT: denies cough or congestion  Respiratory: denies any shortness of breath  Cardiovascular: denies chest pain or palpitations  Gastrointestinal: + abdominal pain, denied N/V Genitourinary: denies burning with urination or urinary frequency  Vital signs in last 24 hours: [min-max] current  Temp:  [98 F (36.7 C)-99.3 F (37.4 C)] 98 F (36.7 C) (11/30 0428) Pulse Rate:  [83-109] 103 (11/30 0644) Resp:  [18-20] 20 (11/30 0428) BP: (129-161)/(51-77) 158/65 (11/30 0644) SpO2:  [86 %-97 %] 97 % (11/30 0428)     Height: 5\' 7"  (170.2 cm) Weight: 68 kg BMI (Calculated): 23.49   Intake/Output last 2 shifts:  11/29 0701 - 11/30 0700 In: -  Out: 100 [Urine:100]   Physical Exam:  Constitutional: alert, cooperative and no distress  HENT: normocephalic without obvious abnormality  Eyes: PERRL, EOM's grossly intact and symmetric  Respiratory: breathing non-labored at rest  Cardiovascular: regular rate and sinus rhythm  Gastrointestinal: Soft, she continues to remain significantly tender, worse on the right, non-distended Musculoskeletal: no edema or wounds, motor and sensation grossly intact, NT    Labs:  CBC Latest Ref Rng & Units 07/15/2021 07/13/2021 03/30/2021  WBC 4.0 - 10.5 K/uL 13.4(H) 7.4 5.9  Hemoglobin 12.0 - 15.0 g/dL 13.2 11.6(L) 11.8(L)  Hematocrit 36.0 - 46.0 % 39.2 33.9(L) 34.8(L)  Platelets 150 - 400 K/uL 189 187 216.0    CMP Latest Ref Rng & Units 07/13/2021 03/30/2021 02/27/2021  Glucose 70 - 99 mg/dL 149(H) 120(H) 144(H)  BUN 8 - 23 mg/dL 23 22 8   Creatinine 0.44 - 1.00 mg/dL 1.33(H) 1.23(H) 1.01(H)  Sodium 135 - 145 mmol/L 139 142 139  Potassium 3.5 - 5.1 mmol/L 3.6 4.7 3.6  Chloride 98 - 111 mmol/L 110 108 109  CO2 22 - 32 mmol/L 21(L) 26 23  Calcium 8.9 - 10.3 mg/dL 8.7(L) 8.7 8.2(L)  Total Protein 6.5 - 8.1 g/dL 6.8 6.4 -  Total Bilirubin 0.3 - 1.2 mg/dL 0.9 0.8 -  Alkaline Phos 38 - 126 U/L 67 78 -  AST 15 - 41 U/L 21 14 -  ALT 0 - 44 U/L 10 12 -     Imaging studies: No new pertinent imaging studies   Assessment/Plan: (ICD-10's: K32.89) 87 y.o. female with worsening lactic acidosis and diffuse abdominal pain found to have pneumatosis and portal venous gas concerning for ischemic enteritis.   - Again, she remains understanding of the situation and our concern for potential bowel compromise. She has a history of similar presentation earlier this year which improved with conservative measures. She understands our typical recommendation would be surgical intervention and that forgoing this intervention could result in death. She continues to refuse any surgical interventions.    - Recommend continued NPO for now given bowel rest; okay with ice chips for comfort - Continue IV Abx (Cefepime, Flagyl) - Monitor abdominal examination +/- serial KUBs - Monitor lactic acidosis; not checked this morning - Monitor leukocytosis; worsening  - Pain control prn; antiemetics  prn               - Further management per primary service; we will follow   All of the above findings and recommendations were discussed with the patient, patient's family (son via telephone), and the medical team, and all of patient's and family's questions were answered to their expressed satisfaction.  -- Edison Simon, PA-C [facility] 07/15/2021, 7:34 AM [phone] M-F: 7am - 4pm

## 2021-07-15 NOTE — Progress Notes (Signed)
PROGRESS NOTE    Jamie Hodges  TDS:[id number] DOB: 06-Jan-1928 DOA: 07/14/2021 PCP: [name], MD  [phone]   Assessment & Plan:   Principal Problem:   Ischemic enteritis [facility]) Active Problems:   AF (paroxysmal atrial fibrillation) ([facility])   Atrial fibrillation ([facility])   CAD (coronary artery disease)   Jamie Hodges is a 87 y.o. female with medical history significant for Paroxysmal atrial fibrillation, HTN, colonic ischemia, CKD 3B, anemia of chronic disease who presented to the ED from [facility] with a complaint of generalized abdominal pain, nausea and diarrhea typical for her episodes of ischemic colitis.  Patient has refused surgery in the past and continues to decline surgical intervention.   Acute ischemic enteritis --pt continues to refuse surgical intervention --started on broad-spectrum abx Plan: --cont abx as ceftriaxone and flagyl, per pharm rec --NPO except sips of water and ice chips --cont LR@75  -IV pain meds and IV antiemetics --palliative consult today    AF (paroxysmal atrial fibrillation) ([place]) --resume home amiodarone, Cardizem and Lopressor --cont home Eliquis (with renal adjustment)  Hypertension --resume home amiodarone, Cardizem and Lopressor  AKI --Cr 1.33 on presentation.  Went up to 2.44 this morning.   --cont LR@75   Lactic acidosis  2/2 bowel ischemia --cont LR@75    DVT prophylaxis: BU:LAGTXMI Code Status: DNR  Family Communication:  Level of care: Med-Surg Dispo:   The patient is from: [facility] Anticipated d/c is to: to be determined Anticipated d/c date is: undetermined Patient currently is not medically ready to d/c due to: bowel ischemia, NPO, on IV abx   Subjective and Interval History:  Pt reported abdominal pain more controlled today.  Felt thirsty.  And wanted to get up from bed.   Objective: Vitals:   07/15/21 0644 07/15/21 0913 07/15/21 1500 07/15/21 1937  BP: (!) 158/65 (!) 163/70 (!) 160/63 (!) 158/66   Pulse: (!) 103 (!) 107 99 95  Resp:  19 16 18   Temp:  98.6 F (37 C) 98.6 F (37 C) 98.5 F (36.9 C)  TempSrc:  Oral Oral Oral  SpO2:  95% 94% 93%  Weight:      Height:        Intake/Output Summary (Last 24 hours) at 07/15/2021 2213 Last data filed at 07/15/2021 0645 Gross per 24 hour  Intake --  Output 100 ml  Net -100 ml   Filed Weights   07/13/21 2228  Weight: 68 kg    Examination:   Constitutional: NAD, AAOx3 HEENT: conjunctivae and lids normal, EOMI CV: No cyanosis.   RESP: normal respiratory effort, on RA Extremities: No effusions, edema in BLE SKIN: warm, dry Neuro: II - XII grossly intact.   Psych: Normal mood and affect.  Appropriate judgement and reason   Data Reviewed: I have personally reviewed following labs and imaging studies  CBC: Recent Labs  Lab 07/13/21 2230 07/15/21 0542  WBC 7.4 13.4*  HGB 11.6* 13.2  HCT 33.9* 39.2  MCV 94.2 94.0  PLT 187 680   Basic Metabolic Panel: Recent Labs  Lab 07/13/21 2230 07/15/21 0542  NA 139 135  K 3.6 4.7  CL 110 109  CO2 21* 19*  GLUCOSE 149* 142*  BUN 23 45*  CREATININE 1.33* 2.44*  CALCIUM 8.7* 8.4*  MG  --  1.8   GFR: Estimated Creatinine Clearance: 14.6 mL/min (A) (by C-G formula based on SCr of 2.44 mg/dL (H)). Liver Function Tests: Recent Labs  Lab 07/13/21 2230  AST 21  ALT 10  ALKPHOS 67  BILITOT 0.9  PROT 6.8  ALBUMIN 3.7   Recent Labs  Lab 07/13/21 2230  LIPASE 96*   No results for input(s): AMMONIA in the last 168 hours. Coagulation Profile: Recent Labs  Lab 07/14/21 0501  INR 1.4*   Cardiac Enzymes: No results for input(s): CKTOTAL, CKMB, CKMBINDEX, TROPONINI in the last 168 hours. BNP (last 3 results) No results for input(s): PROBNP in the last 8760 hours. HbA1C: No results for input(s): HGBA1C in the last 72 hours. CBG: No results for input(s): GLUCAP in the last 168 hours. Lipid Profile: No results for input(s): CHOL, HDL, LDLCALC, TRIG, CHOLHDL,  LDLDIRECT in the last 72 hours. Thyroid Function Tests: No results for input(s): TSH, T4TOTAL, FREET4, T3FREE, THYROIDAB in the last 72 hours. Anemia Panel: No results for input(s): VITAMINB12, FOLATE, FERRITIN, TIBC, IRON, RETICCTPCT in the last 72 hours. Sepsis Labs: Recent Labs  Lab 07/14/21 0133 07/14/21 0339  LATICACIDVEN 2.4* 3.4*    Recent Results (from the past 240 hour(s))  Resp Panel by RT-PCR (Flu A&B, Covid) Nasopharyngeal Swab     Status: None   Collection Time: 07/14/21  1:33 AM   Specimen: Nasopharyngeal Swab; Nasopharyngeal(NP) swabs in vial transport medium  Result Value Ref Range Status   SARS Coronavirus 2 by RT PCR NEGATIVE NEGATIVE Final    Comment: (NOTE) SARS-CoV-2 target nucleic acids are NOT DETECTED.  The SARS-CoV-2 RNA is generally detectable in upper respiratory specimens during the acute phase of infection. The lowest concentration of SARS-CoV-2 viral copies this assay can detect is 138 copies/mL. A negative result does not preclude SARS-Cov-2 infection and should not be used as the sole basis for treatment or other patient management decisions. A negative result may occur with  improper specimen collection/handling, submission of specimen other than nasopharyngeal swab, presence of viral mutation(s) within the areas targeted by this assay, and inadequate number of viral copies(<138 copies/mL). A negative result must be combined with clinical observations, patient history, and epidemiological information. The expected result is Negative.  Fact Sheet for Patients:  EntrepreneurPulse.com.au  Fact Sheet for Healthcare Providers:  IncredibleEmployment.be  This test is no t yet approved or cleared by the [place] FDA and  has been authorized for detection and/or diagnosis of SARS-CoV-2 by FDA under an Emergency Use Authorization (EUA). This EUA will remain  in effect (meaning this test can be used) for the  duration of the COVID-19 declaration under Section 564(b)(1) of the Act, 21 U.S.C.section 360bbb-3(b)(1), unless the authorization is terminated  or revoked sooner.       Influenza A by PCR NEGATIVE NEGATIVE Final   Influenza B by PCR NEGATIVE NEGATIVE Final    Comment: (NOTE) The Xpert Xpress SARS-CoV-2/FLU/RSV plus assay is intended as an aid in the diagnosis of influenza from Nasopharyngeal swab specimens and should not be used as a sole basis for treatment. Nasal washings and aspirates are unacceptable for Xpert Xpress SARS-CoV-2/FLU/RSV testing.  Fact Sheet for Patients: EntrepreneurPulse.com.au  Fact Sheet for Healthcare Providers: IncredibleEmployment.be  This test is not yet approved or cleared by the [place] FDA and has been authorized for detection and/or diagnosis of SARS-CoV-2 by FDA under an Emergency Use Authorization (EUA). This EUA will remain in effect (meaning this test can be used) for the duration of the COVID-19 declaration under Section 564(b)(1) of the Act, 21 U.S.C. section 360bbb-3(b)(1), unless the authorization is terminated or revoked.  Performed at [facility], [address] [place], [place] [address]  Radiology Studies: CT Abdomen Pelvis W Contrast  Result Date: 07/14/2021 CLINICAL DATA:  Generalized abdominal pain with nausea and diarrhea. EXAM: CT ABDOMEN AND PELVIS WITH CONTRAST TECHNIQUE: Multidetector CT imaging of the abdomen and pelvis was performed using the standard protocol following bolus administration of intravenous contrast. CONTRAST:  35mL OMNIPAQUE IOHEXOL 300 MG/ML  SOLN COMPARISON:  February 26, 2021 FINDINGS: Lower chest: Mild atelectasis is seen along the posterior aspect of the left lung base. There is a very small left pleural effusion. Hepatobiliary: An extensive amount of branching air attenuation is seen within the left lobe of the liver. Involvement of the anterior  and medial aspects of the adjacent portion of the right lobe is also seen. This involves numerous portal venous channels (best seen on coronal reformatted image 32, CT series 5) and represents a new finding compared to the prior study. The subcentimeter hypodense hepatic lesion seen within the right lobe of the liver on the prior study is not clearly identified on the current exam. The gallbladder is surgically absent. The common bile duct measures 8 mm in diameter. Pancreas: Unremarkable. No pancreatic ductal dilatation or surrounding inflammatory changes. Spleen: Normal in size without focal abnormality. Adrenals/Urinary Tract: Adrenal glands are unremarkable. The right kidney is mildly decreased in size when compared to the left kidney. There is no evidence of renal calculi or hydronephrosis. 7 mm and 8 mm cystic appearing areas are noted within the left kidney. Bladder is unremarkable. Stomach/Bowel: There is a small hiatal hernia. The appendix is not clearly identified. No evidence of bowel dilatation. Pneumatosis is seen involving loops of inflamed distal small bowel. This involves predominantly loops of distal jejunum and proximal ileum. Noninflamed diverticula are seen throughout the descending and sigmoid colon. Vascular/Lymphatic: Aortic atherosclerosis. No enlarged abdominal or pelvic lymph nodes. Reproductive: Uterus and bilateral adnexa are unremarkable. Other: A stable 5.8 cm x 1.4 cm fat containing ventral hernia is seen along the midline of the mid to upper abdomen. No abdominopelvic ascites. Musculoskeletal: Marked severity multilevel degenerative changes are seen throughout the lumbar spine. IMPRESSION: 1. Findings consistent with enteritis with associated pneumatosis intestinalis and portal venous gas secondary to bowel ischemia. 2. Colonic diverticulosis. 3. Evidence of prior cholecystectomy. 4. Small hiatal hernia. 5. Aortic atherosclerosis. Aortic Atherosclerosis (ICD10-I70.0). Electronically  Signed   By: Virgina Norfolk M.D.   On: 07/14/2021 01:18   DG Chest Port 1 View  Result Date: 07/14/2021 CLINICAL DATA:  Generalized abdominal pain with nausea and diarrhea. EXAM: PORTABLE CHEST 1 VIEW COMPARISON:  February 26, 2021 FINDINGS: The lungs are hyperinflated. Diffuse, chronic appearing increased interstitial lung markings are seen. There is no evidence of acute infiltrate, pleural effusion or pneumothorax. The heart size and mediastinal contours are within normal limits. There is marked severity calcification of the aortic arch. The visualized skeletal structures are unremarkable. IMPRESSION: Stable exam without acute or active cardiopulmonary disease. Electronically Signed   By: Virgina Norfolk M.D.   On: 07/14/2021 03:11     Scheduled Meds:  amiodarone  200 mg Oral Daily   apixaban  2.5 mg Oral BID   diltiazem  360 mg Oral Daily   metoprolol tartrate  50 mg Oral BID   Continuous Infusions:  [START ON 07/16/2021] cefTRIAXone (ROCEPHIN)  IV     lactated ringers 75 mL/hr at 07/15/21 0845   metronidazole 500 mg (07/15/21 2111)     LOS: 1 day     Enzo Bi, MD [facility] If 7PM-7AM, please contact night-coverage 07/15/2021, 10:13  PM

## 2021-07-16 DIAGNOSIS — K6389 Other specified diseases of intestine: Secondary | ICD-10-CM | POA: Diagnosis not present

## 2021-07-16 LAB — CBC
HCT: 33.6 % — ABNORMAL LOW (ref 36.0–46.0)
Hemoglobin: 11.3 g/dL — ABNORMAL LOW (ref 12.0–15.0)
MCH: 32 pg (ref 26.0–34.0)
MCHC: 33.6 g/dL (ref 30.0–36.0)
MCV: 95.2 fL (ref 80.0–100.0)
Platelets: 161 10*3/uL (ref 150–400)
RBC: 3.53 MIL/uL — ABNORMAL LOW (ref 3.87–5.11)
RDW: 14.7 % (ref 11.5–15.5)
WBC: 10.6 10*3/uL — ABNORMAL HIGH (ref 4.0–10.5)
nRBC: 0 % (ref 0.0–0.2)

## 2021-07-16 LAB — BASIC METABOLIC PANEL
Anion gap: 8 (ref 5–15)
BUN: 52 mg/dL — ABNORMAL HIGH (ref 8–23)
CO2: 19 mmol/L — ABNORMAL LOW (ref 22–32)
Calcium: 8.2 mg/dL — ABNORMAL LOW (ref 8.9–10.3)
Chloride: 108 mmol/L (ref 98–111)
Creatinine, Ser: 2.34 mg/dL — ABNORMAL HIGH (ref 0.44–1.00)
GFR, Estimated: 19 mL/min — ABNORMAL LOW (ref >60)
Glucose, Bld: 96 mg/dL (ref 70–99)
Potassium: 4.5 mmol/L (ref 3.5–5.1)
Sodium: 135 mmol/L (ref 135–145)

## 2021-07-16 LAB — MAGNESIUM: Magnesium: 1.9 mg/dL (ref 1.7–2.4)

## 2021-07-16 NOTE — Progress Notes (Signed)
PT Cancellation Note  Patient Details Name: Jamie Hodges MRN: [id number] DOB: [birth year]   Cancelled Treatment:    Reason Eval/Treat Not Completed: Patient declined, no reason specified. Orders received and chart reviewed. Upon entry to room pt politely declining PT eval due to nausea despite encouragement. Requesting PT to return later today. PT to re-attempt at later time/date as able.   Salem Caster. Fairly IV, PT, DPT Physical Therapist- [facility]  07/16/2021, 9:39 AM

## 2021-07-16 NOTE — Progress Notes (Signed)
PROGRESS NOTE  Jamie Hodges:[id number] DOB: Oct 03, 1927 DOA: 07/14/2021 PCP: [name], MD  Brief History   Jamie Hodges is a 87 y.o. female with medical history significant for Paroxysmal atrial fibrillation, HTN, colonic ischemia, CKD 3B, anemia of chronic disease who presented to the ED from [facility] with a complaint of generalized abdominal pain, nausea and diarrhea typical for her episodes of ischemic colitis.  Patient has refused surgery in the past and continues to decline surgical intervention.  The patient is being treated with IV Rocephin and Flagyl. General surgery is following. Her diet has been advanced to clear liquids, but the patient states that she has no appetite.  She is having flatus, but no BM.  Consultants  Palliative care  Procedures  None  Antibiotics   Anti-infectives (From admission, onward)    Start     Dose/Rate Route Frequency Ordered Stop   07/16/21 1000  cefTRIAXone (ROCEPHIN) 2 g in sodium chloride 0.9 % 100 mL IVPB        2 g 200 mL/hr over 30 Minutes Intravenous Every 24 hours 07/15/21 0903     07/15/21 0200  ceFEPIme (MAXIPIME) 2 g in sodium chloride 0.9 % 100 mL IVPB  Status:  Discontinued        2 g 200 mL/hr over 30 Minutes Intravenous Every 24 hours 07/14/21 1033 07/15/21 0903   07/14/21 2000  metroNIDAZOLE (FLAGYL) IVPB 500 mg        500 mg 100 mL/hr over 60 Minutes Intravenous Every 12 hours 07/14/21 1033     07/14/21 0800  meropenem (MERREM) 500 mg in sodium chloride 0.9 % 100 mL IVPB  Status:  Discontinued        500 mg 200 mL/hr over 30 Minutes Intravenous Every 12 hours 07/14/21 0529 07/14/21 0957   07/14/21 0200  ceFEPIme (MAXIPIME) 2 g in sodium chloride 0.9 % 100 mL IVPB        2 g 200 mL/hr over 30 Minutes Intravenous  Once 07/14/21 0156 07/14/21 0306   07/14/21 0200  metroNIDAZOLE (FLAGYL) IVPB 500 mg        500 mg 100 mL/hr over 60 Minutes Intravenous  Once 07/14/21 0156 07/14/21 0334   07/14/21 0200  vancomycin  (VANCOCIN) IVPB 1000 mg/200 mL premix        1,000 mg 200 mL/hr over 60 Minutes Intravenous  Once 07/14/21 0156 07/14/21 0443        Subjective  The patient is lying in bag. She states that she just doesn't feel well and that her belly is continuing to bother her. She knows that she can have clear liquids, but states that she is not interested.  Objective   Vitals:  Vitals:   07/16/21 0812 07/16/21 1502  BP: (!) 154/55 (!) 143/58  Pulse: 78 (!) 59  Resp:    Temp: 98.7 F (37.1 C)   SpO2: 91% 92%    Exam:  Constitutional:  The patient is awake, alert, and oriented x 3. No acute distress. Respiratory:  No increased work of breathing. No wheezes, rales, or rhonchi No tactile fremitus Cardiovascular:  Regular rate and rhythm No murmurs, ectopy, or gallups. No lateral PMI. No thrills. Abdomen:  Abdomen is soft, slightly distended, and tender, right greater than left. No hernias, masses, or organomegaly Hypo-active bowel sounds.  Musculoskeletal:  No cyanosis, clubbing, or edema Skin:  No rashes, lesions, ulcers palpation of skin: no induration or nodules Neurologic:  CN 2-12 intact Sensation all 4 extremities  intact Psychiatric:  Mental status Mood, affect appropriate Orientation to person, place, time  judgment and insight appear intact  I have personally reviewed the following:   Today's Data  Vitals  Lab Data  CBC, BMP  Imaging  CT abdomen and pelvis  Cardiology Data  EKG  Scheduled Meds:  amiodarone  200 mg Oral Daily   apixaban  2.5 mg Oral BID   diltiazem  360 mg Oral Daily   metoprolol tartrate  50 mg Oral BID   Continuous Infusions:  cefTRIAXone (ROCEPHIN)  IV 2 g (07/16/21 3704)   lactated ringers 75 mL/hr at 07/16/21 0831   metronidazole 500 mg (07/16/21 8889)    Principal Problem:   Ischemic enteritis ([place]) Active Problems:   AF (paroxysmal atrial fibrillation) (HCC)   Atrial fibrillation (HCC)   CAD (coronary artery  disease)   LOS: 2 days   A & P  Acute ischemic enteritis --pt continues to refuse surgical intervention --started on broad-spectrum abx Plan: --cont abx as ceftriaxone and flagyl, per pharm rec --NPO except sips of water and ice chips --cont LR@75  -IV pain meds and IV antiemetics She continues to improve, but slowly. --palliative consult today     AF (paroxysmal atrial fibrillation) (HCC) --resume home amiodarone, Cardizem and Lopressor --cont home Eliquis (with renal adjustment)  Hypertension --resume home amiodarone, Cardizem and Lopressor   AKI --Cr 1.33 on presentation.  Creatinine is improved from 2.44 to 2.34 today. Continue IV fluids. Monitor creatinine, electrolytes and volume status.   Lactic acidosis  2/2 bowel ischemia --cont LR@75   I have seen and examined this patient myself. I have spent 34 minutes in her evaluation and care.     DVT prophylaxis: [id number]:[id number] Code Status: DNR  Family Communication:  Level of care: Med-Surg Dispo:   The patient is from: [facility] ILF Anticipated d/c is to: to be determined Anticipated d/c date is: undetermined Patient currently is not medically ready to d/c due to: bowel ischemia, NPO, on IV abx   Torry Istre, DO [facility] Direct contact: see [email or web address]  7PM-7AM contact night coverage as above 07/16/2021, 6:37 PM  LOS: 2 days

## 2021-07-16 NOTE — Progress Notes (Signed)
[facility] SURGICAL PROGRESS NOTE (cpt [phone])  Hospital Day(s): 2.   Interval History: Patient seen and examined, no acute events or new complaints overnight. Patient reports she is feeling better this morning. Still with right > left sided abdominal soreness but again, she reports this is improved. No fever, chills, nausea, emesis. Leukocytosis improved this morning; WBC 10.6K. Renal function made slight improvements as well; sCr - 2.34; UO - 700 ccs. No significant electrolyte derangements. She continues on Cefepime and Flagyl. She has been NPO aside from sips of water/ice for comfort.   She continues to refuse surgical interventions.   Review of Systems:  Constitutional: denies fever, chills  HEENT: denies cough or congestion  Respiratory: denies any shortness of breath  Cardiovascular: denies chest pain or palpitations  Gastrointestinal: + abdominal pain, denied N/V Genitourinary: denies burning with urination or urinary frequency  Vital signs in last 24 hours: [min-max] current  Temp:  [98.5 F (36.9 C)-98.6 F (37 C)] 98.5 F (36.9 C) (11/30 1937) Pulse Rate:  [95-107] 95 (11/30 1937) Resp:  [16-19] 18 (11/30 1937) BP: (158-163)/(63-70) 158/66 (11/30 1937) SpO2:  [93 %-95 %] 93 % (11/30 1937)     Height: 5\' 7"  (170.2 cm) Weight: 68 kg BMI (Calculated): 23.49   Intake/Output last 2 shifts:  11/30 0701 - 12/01 0700 In: 2463.8 [I.V.:2163.8; IV Piggyback:300] Out: 700 [Urine:700]   Physical Exam:  Constitutional: alert, cooperative and no distress  HENT: normocephalic without obvious abnormality  Eyes: PERRL, EOM's grossly intact and symmetric  Respiratory: breathing non-labored at rest  Cardiovascular: regular rate and sinus rhythm  Gastrointestinal: Soft, still tender right > left but this is improving, non-distended Musculoskeletal: no edema or wounds, motor and sensation grossly intact, NT    Labs:  CBC Latest Ref Rng & Units 07/16/2021 07/15/2021  07/13/2021  WBC 4.0 - 10.5 K/uL 10.6(H) 13.4(H) 7.4  Hemoglobin 12.0 - 15.0 g/dL 11.3(L) 13.2 11.6(L)  Hematocrit 36.0 - 46.0 % 33.6(L) 39.2 33.9(L)  Platelets 150 - 400 K/uL 161 189 187   CMP Latest Ref Rng & Units 07/16/2021 07/15/2021 07/13/2021  Glucose 70 - 99 mg/dL 96 142(H) 149(H)  BUN 8 - 23 mg/dL 52(H) 45(H) 23  Creatinine 0.44 - 1.00 mg/dL 2.34(H) 2.44(H) 1.33(H)  Sodium 135 - 145 mmol/L 135 135 139  Potassium 3.5 - 5.1 mmol/L 4.5 4.7 3.6  Chloride 98 - 111 mmol/L 108 109 110  CO2 22 - 32 mmol/L 19(L) 19(L) 21(L)  Calcium 8.9 - 10.3 mg/dL 8.2(L) 8.4(L) 8.7(L)  Total Protein 6.5 - 8.1 g/dL - - 6.8  Total Bilirubin 0.3 - 1.2 mg/dL - - 0.9  Alkaline Phos 38 - 126 U/L - - 67  AST 15 - 41 U/L - - 21  ALT 0 - 44 U/L - - 10     Imaging studies: No new pertinent imaging studies   Assessment/Plan: (ICD-10's: K72.89) 87 y.o. female found to have pneumatosis and portal venous gas concerning for ischemic enteritis.   - Again, she remains understanding of the situation and our concern for potential bowel compromise. She has a history of similar presentation earlier this year which improved with conservative measures. She understands our typical recommendation would be surgical intervention and that forgoing this intervention could result in death. She continues to refuse any surgical interventions.    - thankfully, she has made some clinical improvement this morning. We will trial CLD today - Continue IV Abx (Cefepime, Flagyl) - Monitor abdominal examination +/- serial KUBs -  Monitor leukocytosis: improved - Monitor renal function; improved - Pain control prn; antiemetics prn               - Further management per primary service; we will follow   All of the above findings and recommendations were discussed with the patient, patient's family (son via telephone), and the medical team, and all of patient's and family's questions were answered to their expressed  satisfaction.  -- Edison Simon, PA-C [facility] 07/16/2021, 7:11 AM [phone] M-F: 7am - 4pm

## 2021-07-16 NOTE — Progress Notes (Signed)
PT Cancellation Note  Patient Details Name: Jamie Hodges MRN: [id number] DOB: March 24, 1928   Cancelled Treatment:    Reason Eval/Treat Not Completed: Patient declined, no reason specified. PT orders received and pt chart reviewed. Pt supine in bed upon entry, stating that she does not want to work with PT this afternoon as she is tired and doesn't feel well. Pt states that she worked with OT this morning, and doesn't feel that she needs OT. PT explained PT role/POC for hospital, and pt agreeable, but continued to decline participation with PT today despite encouragement and education regarding benefits of OOB mobility for return to baseline function. Will follow up with PT eval tomorrow as appropriate.   Herminio Commons, PT, DPT 12:50 PM,07/16/21

## 2021-07-16 NOTE — Evaluation (Signed)
Occupational Therapy Evaluation Patient Details Name: Jamie Hodges MRN: [id number] DOB: 26-Sep-1927 Today's Date: 07/16/2021   History of Present Illness Pt is a 87 y/o F with PMH: Paroxysmal atrial fibrillation, HTN, colonic ischemia, CKD 3B, anemia of chronic disease who presented to the ED from [facility] with a complaint of generalized abdominal pain, nausea and diarrhea typical for her episodes of ischemic colitis.  Patient has refused surgery in the past and continues to decline surgical intervention.   Clinical Impression   Pt seen for OT evaluation this date in setting of acute hospitalization d/t abd pain. Pt presents this date with some confusion so difficult to ascertain PLOF. She presents with decreased activity tolerance, strength and balance. On OT assessment this date, pt requiring MIN A for ADL transfers with cane. Her knees are noted to become tremulous in standing and pt requires MOD A ultimately to take ~5 steps from bed to chair for safety with gait belt and SPC (OT offers use of RW instead but pt declines, she would clearly benefit from bilateral support versus unilateral). For UB ADLs she is currently requiring SETUP and for seated LB ADLs, she is requiring MOD A d/t abd discomfort. She is left in chair with chair alarm on and all needs met/in reach. Will continue to follow.      Recommendations for follow up therapy are one component of a multi-disciplinary discharge planning process, led by the attending physician.  Recommendations may be updated based on patient status, additional functional criteria and insurance authorization.   Follow Up Recommendations  Skilled nursing-short term rehab (<3 hours/day)    Assistance Recommended at Discharge Frequent or constant Supervision/Assistance  Functional Status Assessment  Patient has had a recent decline in their functional status and demonstrates the ability to make significant improvements in function in a reasonable and  predictable amount of time.  Equipment Recommendations  Other (comment) (defer to facility)    Recommendations for Other Services       Precautions / Restrictions Precautions Precautions: Fall Restrictions Weight Bearing Restrictions: No      Mobility Bed Mobility Overal bed mobility: Needs Assistance Bed Mobility: Supine to Sit     Supine to sit: Min guard;HOB elevated     General bed mobility comments: increased time    Transfers Overall transfer level: Needs assistance Equipment used: Straight cane Transfers: Sit to/from Stand Sit to Stand: Min assist           General transfer comment: increased time, cues for safety      Balance Overall balance assessment: Needs assistance Sitting-balance support: Feet supported Sitting balance-Leahy Scale: Good     Standing balance support: Single extremity supported;Bilateral upper extremity supported Standing balance-Leahy Scale: Poor Standing balance comment: pt initially with F balance, but her knees are noted to become tremulous with minimal steps from bed to chair (~5) and her standing balance decreases to poor as she requires more assist to prevent fall. While cane was the AD used at pt's insistence, ultimately, she requies support under L UE as well d/t weakness                           ADL either performed or assessed with clinical judgement   ADL  General ADL Comments: requires SETUP for seated UB ADLs, MIN A for seated LB ADLs, MOD A for ADL transfers and fxl mobility with gait belt and her personal cane used (pt declines to use walker when OT offers), but she would clearly benefit from use of RW for B UE support rather than unilateral.     Vision Patient Visual Report: No change from baseline       Perception     Praxis      Pertinent Vitals/Pain Pain Assessment:  (no c/o pain, but does c/o nausea/upset stomach throughout session)      Hand Dominance     Extremity/Trunk Assessment Upper Extremity Assessment Upper Extremity Assessment: Overall WFL for tasks assessed;Generalized weakness (ROM WFL, MMT grossly 4-/5)   Lower Extremity Assessment Lower Extremity Assessment: Overall WFL for tasks assessed;Generalized weakness (limited tolerance for hip ROM as it pertains to LB dressing, but appears more r/t stomach being upset than true ROM deficit/weakness)       Communication Communication Communication: HOH   Cognition Arousal/Alertness: Awake/alert Behavior During Therapy: WFL for tasks assessed/performed;Restless Overall Cognitive Status: No family/caregiver present to determine baseline cognitive functioning                                 General Comments: Pt is oriented to self and place, but not situation or time, and while she states the correct location initially, she then seemingly forgets and states something about checking answering machine while pushing the call button. Unsure of cognitive baseline, but pt is noted to repeat herself a few times during session as well. She follows most basic one to two step commands, difficulty with higher level cognitive tasks.     General Comments       Exercises Other Exercises Other Exercises: Ed re: role of OT   Shoulder Instructions      Home Living Family/patient expects to be discharged to:: Assisted living ([place]) Living Arrangements: Alone                           Home Equipment: Conservation officer, nature (2 wheels);Cane - single point   Additional Comments: states she doesn't use the walker, mostly uses cane      Prior Functioning/Environment Prior Level of Function : Needs assist       Physical Assist : ADLs (physical)   ADLs (physical): IADLs   ADLs Comments: facility assist for IADLs including med mgt        OT Problem List: Decreased strength;Decreased activity tolerance;Impaired balance (sitting and/or  standing);Decreased cognition;Decreased safety awareness;Decreased knowledge of use of DME or AE      OT Treatment/Interventions: Self-care/ADL training;Therapeutic exercise;DME and/or AE instruction;Therapeutic activities;Balance training    OT Goals(Current goals can be found in the care plan section) Acute Rehab OT Goals Patient Stated Goal: to go home OT Goal Formulation: With patient Time For Goal Achievement: 07/30/21 Potential to Achieve Goals: Good ADL Goals Pt Will Perform Lower Body Dressing: with supervision;sit to/from stand Pt Will Transfer to Toilet: with supervision;ambulating (LRAD) Pt Will Perform Toileting - Clothing Manipulation and hygiene: with supervision;sit to/from stand Pt/caregiver will Perform Home Exercise Program: Increased strength;Both right and left upper extremity;With Supervision  OT Frequency: Min 2X/week   Barriers to D/C:            Co-evaluation  AM-PAC OT "6 Clicks" Daily Activity     Outcome Measure   Help from another person taking care of personal grooming?: A Little Help from another person toileting, which includes using toliet, bedpan, or urinal?: A Lot Help from another person bathing (including washing, rinsing, drying)?: A Lot Help from another person to put on and taking off regular upper body clothing?: A Little Help from another person to put on and taking off regular lower body clothing?: A Little 6 Click Score: 13   End of Session Equipment Utilized During Treatment: Gait belt;Other (comment) (cane) Nurse Communication: Mobility status  Activity Tolerance: Patient tolerated treatment well Patient left: with call bell/phone within reach;in chair;with chair alarm set  OT Visit Diagnosis: Unsteadiness on feet (R26.81);Muscle weakness (generalized) (M62.81)                Time: 1792-1783 OT Time Calculation (min): 21 min Charges:  OT General Charges $OT Visit: 1 Visit OT Evaluation $OT Eval Moderate  Complexity: 1 Mod OT Treatments $Self Care/Home Management : 8-22 mins  Gerrianne Scale, MS, OTR/L ascom [phone] 07/16/21, 7:11 PM

## 2021-07-17 ENCOUNTER — Inpatient Hospital Stay: Payer: Medicare Other

## 2021-07-17 DIAGNOSIS — K559 Vascular disorder of intestine, unspecified: Secondary | ICD-10-CM | POA: Diagnosis not present

## 2021-07-17 LAB — BASIC METABOLIC PANEL
Anion gap: 9 (ref 5–15)
BUN: 58 mg/dL — ABNORMAL HIGH (ref 8–23)
CO2: 20 mmol/L — ABNORMAL LOW (ref 22–32)
Calcium: 8.1 mg/dL — ABNORMAL LOW (ref 8.9–10.3)
Chloride: 107 mmol/L (ref 98–111)
Creatinine, Ser: 2.37 mg/dL — ABNORMAL HIGH (ref 0.44–1.00)
GFR, Estimated: 19 mL/min — ABNORMAL LOW (ref >60)
Glucose, Bld: 104 mg/dL — ABNORMAL HIGH (ref 70–99)
Potassium: 4.2 mmol/L (ref 3.5–5.1)
Sodium: 136 mmol/L (ref 135–145)

## 2021-07-17 LAB — CBC
HCT: 30 % — ABNORMAL LOW (ref 36.0–46.0)
Hemoglobin: 9.9 g/dL — ABNORMAL LOW (ref 12.0–15.0)
MCH: 31.8 pg (ref 26.0–34.0)
MCHC: 33 g/dL (ref 30.0–36.0)
MCV: 96.5 fL (ref 80.0–100.0)
Platelets: 169 10*3/uL (ref 150–400)
RBC: 3.11 MIL/uL — ABNORMAL LOW (ref 3.87–5.11)
RDW: 14.7 % (ref 11.5–15.5)
WBC: 13.7 10*3/uL — ABNORMAL HIGH (ref 4.0–10.5)
nRBC: 0 % (ref 0.0–0.2)

## 2021-07-17 LAB — MAGNESIUM: Magnesium: 2 mg/dL (ref 1.7–2.4)

## 2021-07-17 MED ORDER — IOHEXOL 9 MG/ML PO SOLN
500.0000 mL | ORAL | Status: AC
  Administered 2021-07-17 (×2): 500 mL via ORAL

## 2021-07-17 NOTE — Progress Notes (Signed)
Palliative:  Chart reviewed. Checked in with nurse - patient with symptom improvement today. Plans for CT later today. Goals remains clear to allow time for outcomes with continued support of IV fluids and IV antibiotics. No palliative needs identified today - please reach out if needs arise.  Juel Burrow, DNP, AGNP-C Palliative Medicine Team Team Phone # [phone]  Pager # [phone]  NO CHARGE

## 2021-07-17 NOTE — Care Management Important Message (Signed)
Important Message  Patient Details  Name: Jamie Hodges MRN: [id number] Date of Birth: 07-12-1928   Medicare Important Message Given:  Yes     Dannette Barbara 07/17/2021, 2:05 PM

## 2021-07-17 NOTE — Progress Notes (Signed)
PROGRESS NOTE  Jamie Hodges JQZ:[id number] DOB: 27-Jun-1928 DOA: 07/14/2021 PCP: [name], MD  Brief History   Jamie Hodges is a 87 y.o. female with medical history significant for Paroxysmal atrial fibrillation, HTN, colonic ischemia, CKD 3B, anemia of chronic disease who presented to the ED from [facility] with a complaint of generalized abdominal pain, nausea and diarrhea typical for her episodes of ischemic colitis.  Patient has refused surgery in the past and continues to decline surgical intervention.  The patient is being treated with IV Rocephin and Flagyl. General surgery is following. Her diet has been advanced to clear liquids, but the patient states that she has no appetite.  She is having flatus, but no BM.  Repeat CT today demonstrated likely ileus. Surgery prefers to treat conservatively at this point, but patient may require NGT to decompress.  Consultants  Palliative care  Procedures  None  Antibiotics   Anti-infectives (From admission, onward)    Start     Dose/Rate Route Frequency Ordered Stop   07/16/21 1000  cefTRIAXone (ROCEPHIN) 2 g in sodium chloride 0.9 % 100 mL IVPB        2 g 200 mL/hr over 30 Minutes Intravenous Every 24 hours 07/15/21 0903     07/15/21 0200  ceFEPIme (MAXIPIME) 2 g in sodium chloride 0.9 % 100 mL IVPB  Status:  Discontinued        2 g 200 mL/hr over 30 Minutes Intravenous Every 24 hours 07/14/21 1033 07/15/21 0903   07/14/21 2000  metroNIDAZOLE (FLAGYL) IVPB 500 mg        500 mg 100 mL/hr over 60 Minutes Intravenous Every 12 hours 07/14/21 1033     07/14/21 0800  meropenem (MERREM) 500 mg in sodium chloride 0.9 % 100 mL IVPB  Status:  Discontinued        500 mg 200 mL/hr over 30 Minutes Intravenous Every 12 hours 07/14/21 0529 07/14/21 0957   07/14/21 0200  ceFEPIme (MAXIPIME) 2 g in sodium chloride 0.9 % 100 mL IVPB        2 g 200 mL/hr over 30 Minutes Intravenous  Once 07/14/21 0156 07/14/21 0306   07/14/21 0200   metroNIDAZOLE (FLAGYL) IVPB 500 mg        500 mg 100 mL/hr over 60 Minutes Intravenous  Once 07/14/21 0156 07/14/21 0334   07/14/21 0200  vancomycin (VANCOCIN) IVPB 1000 mg/200 mL premix        1,000 mg 200 mL/hr over 60 Minutes Intravenous  Once 07/14/21 0156 07/14/21 0443        Subjective  The patient is sitting up in chair at bedside. She states that she is feeling better.  Objective   Vitals:  Vitals:   07/17/21 0748 07/17/21 1540  BP: (!) 160/54 (!) 146/54  Pulse: 80 72  Resp: 16 16  Temp: 98 F (36.7 C) 97.8 F (36.6 C)  SpO2: 92% 95%    Exam:  Constitutional:  The patient is awake, alert, and oriented x 3. No acute distress. Respiratory:  No increased work of breathing. No wheezes, rales, or rhonchi No tactile fremitus Cardiovascular:  Regular rate and rhythm No murmurs, ectopy, or gallups. No lateral PMI. No thrills. Abdomen:  Abdomen is soft, slightly distended, and tender, right greater than left. No hernias, masses, or organomegaly Hypo-active bowel sounds.  Musculoskeletal:  No cyanosis, clubbing, or edema Skin:  No rashes, lesions, ulcers palpation of skin: no induration or nodules Neurologic:  CN 2-12 intact Sensation all  4 extremities intact Psychiatric:  Mental status Mood, affect appropriate Orientation to person, place, time  judgment and insight appear intact  I have personally reviewed the following:   Today's Data  Vitals  Lab Data  CBC, BMP  Imaging  CT abdomen and pelvis  Cardiology Data  EKG  Scheduled Meds:  amiodarone  200 mg Oral Daily   apixaban  2.5 mg Oral BID   diltiazem  360 mg Oral Daily   metoprolol tartrate  50 mg Oral BID   Continuous Infusions:  cefTRIAXone (ROCEPHIN)  IV 2 g (07/17/21 1144)   lactated ringers 75 mL/hr at 07/16/21 2208   metronidazole 500 mg (07/17/21 0928)    Principal Problem:   Ischemic enteritis ([place]) Active Problems:   AF (paroxysmal atrial fibrillation) ([place])   Atrial  fibrillation (HCC)   CAD (coronary artery disease)   LOS: 3 days   A & P  Acute ischemic enteritis --pt continues to refuse surgical intervention --started on broad-spectrum abx Plan: --cont abx as ceftriaxone and flagyl, per pharm rec --NPO except sips of water and ice chips --cont LR@75  -IV pain meds and IV antiemetics She continues to improve, but slowly. --palliative consult today  Ileus: CT today indicates possible development of ileus. Pt is again NPO. Monitor. May require NGT to decompress.     AF (paroxysmal atrial fibrillation) ([place]) --resume home amiodarone, Cardizem and Lopressor --cont home Eliquis (with renal adjustment) -Heart rate is controlled.   Hypertension --resume home amiodarone, Cardizem and Lopressor. The patient is a little hypertensive today. Will add IV Hydralazine prn due to possibility that patient may not be absorbing oral antihypertensives.   AKI --Cr 1.33 on presentation.  Creatinine is improved from 2.44 to 2.37 today. Continue IV fluids. Monitor creatinine, electrolytes and volume status.   Lactic acidosis  2/2 bowel ischemia --cont LR@75   I have seen and examined this patient myself. I have spent 36 minutes in her evaluation and care.     DVT prophylaxis: [id number] Code Status: DNR  Family Communication:  Level of care: Med-Surg Dispo:   The patient is from: [facility] ILF Anticipated d/c is to: to be determined Anticipated d/c date is: undetermined Patient currently is not medically ready to d/c due to: bowel ischemia, NPO, on IV abx   Jamie Watanabe, DO [facility] Direct contact: see [email or web address]  7PM-7AM contact night coverage as above 07/17/2021, 5:31 PM  LOS: 2 days

## 2021-07-17 NOTE — Evaluation (Signed)
Physical Therapy Evaluation Patient Details Name: Jamie Hodges MRN: [id number] DOB: 04-Nov-1927 Today's Date: 07/17/2021  History of Present Illness  Pt is a 87 y/o F with PMH: Paroxysmal atrial fibrillation, HTN, colonic ischemia, CKD 3B, anemia of chronic disease who presented to the ED from [facility] with a complaint of generalized abdominal pain, nausea and diarrhea typical for her episodes of ischemic colitis.  Patient has refused surgery in the past and continues to decline surgical intervention.   Clinical Impression  Pt admitted with above diagnosis. Pt received in bed agreeable to PT eval. Initially declining any mobility past transferring to recliner but ultimately agreeable to trial ambulation. Reports being from independent living at [facility]. Prior to admission and onset of GI symptoms pt independent with all mobility, IADL's/ADL's and did not require SPC but in the last couple months pt has had to rely on [facility] for mobility. Today pt is supervision to transfer to sitting with HOB minimally elevated with good sitting balance. Does require multiple bouts of momentum to stand with SPC in RUE with minguard provided. Pt is shaky in LE's with transfer to recliner. With further encouragement pt is agreeable to participate in ambulating in room. Same assist provided/needed to stand from recliner. Pt relies on LUE touching walls and objects in room during ambulation with SPC in RUE tolerating ~12' requesting to return to recliner. Pt very unsteady with SPC and would benefit from RW to reduce risk of falls. Pt refusing SNF placement and wishing to return to independent living. Pt educated she would benefit from ambulating with RW and at least [facility] PT services to improve her safety in ambulation and reduce risk of falls. Pt agreeable to [facility] PT. Pt currently with functional limitations due to the deficits listed below (see PT Problem List). Pt will benefit from skilled PT to increase their independence and  safety with mobility to allow discharge to the venue listed below.      Recommendations for follow up therapy are one component of a multi-disciplinary discharge planning process, led by the attending physician.  Recommendations may be updated based on patient status, additional functional criteria and insurance authorization.  Follow Up Recommendations Home health PT (Pt refusing SNF, stating she wants to return to independent living)    Assistance Recommended at Discharge Frequent or constant Supervision/Assistance  Functional Status Assessment Patient has had a recent decline in their functional status and/or demonstrates limited ability to make significant improvements in function in a reasonable and predictable amount of time  Equipment Recommendations  None recommended by PT (Has RW at home)    Recommendations for Other Services       Precautions / Restrictions Precautions Precautions: Fall Restrictions Weight Bearing Restrictions: No      Mobility  Bed Mobility Overal bed mobility: Needs Assistance Bed Mobility: Supine to Sit     Supine to sit: Supervision;HOB elevated     General bed mobility comments: increased time Patient Response: Cooperative  Transfers Overall transfer level: Needs assistance Equipment used: Straight cane Transfers: Sit to/from Stand Sit to Stand: Min guard           General transfer comment: increased time and multiple bouts to stand    Ambulation/Gait Ambulation/Gait assistance: Min guard Gait Distance (Feet): 12 Feet Assistive device: Straight cane Gait Pattern/deviations: Step-to pattern;Narrow base of support;Shuffle;Decreased step length - right;Decreased step length - left       General Gait Details: Relies on RUE for SPC use and LUE on objects/surfaces in  room to ambulate safely. Would benefit from RW for improved and safer stability with ambulation.  Stairs            Wheelchair Mobility    Modified Rankin  (Stroke Patients Only)       Balance Overall balance assessment: Needs assistance Sitting-balance support: Feet supported Sitting balance-Leahy Scale: Good     Standing balance support: Single extremity supported Standing balance-Leahy Scale: Poor Standing balance comment: Requires frequent touching of objects with LUE. LE's are shaky with household ambulation distances.                             Pertinent Vitals/Pain Pain Assessment:  (abdominal discomfort)    Home Living Family/patient expects to be discharged to:: Assisted living ([facility]) Living Arrangements: Alone               Home Equipment: Conservation officer, nature (2 wheels);Cane - single point Additional Comments: states she doesn't use the walker, mostly uses cane    Prior Function Prior Level of Function : Needs assist       Physical Assist : ADLs (physical)   ADLs (physical): IADLs   ADLs Comments: facility assist for IADLs including med mgt     Hand Dominance        Extremity/Trunk Assessment   Upper Extremity Assessment Upper Extremity Assessment: Generalized weakness    Lower Extremity Assessment Lower Extremity Assessment: Generalized weakness    Cervical / Trunk Assessment Cervical / Trunk Assessment: Normal  Communication   Communication: HOH  Cognition Arousal/Alertness: Awake/alert Behavior During Therapy: WFL for tasks assessed/performed Overall Cognitive Status: No family/caregiver present to determine baseline cognitive functioning                                          General Comments      Exercises Other Exercises Other Exercises: Role of PT in acute setting, D/c recs, DME recs   Assessment/Plan    PT Assessment Patient needs continued PT services  PT Problem List Decreased strength;Decreased knowledge of use of DME;Decreased activity tolerance;Decreased safety awareness;Decreased balance;Decreased mobility       PT  Treatment Interventions DME instruction;Balance training;Gait training;Neuromuscular re-education;Stair training;Functional mobility training;Patient/family education;Therapeutic activities;Therapeutic exercise    PT Goals (Current goals can be found in the Care Plan section)  Acute Rehab PT Goals Patient Stated Goal: to return to independent living PT Goal Formulation: With patient Time For Goal Achievement: 07/31/21 Potential to Achieve Goals: Fair    Frequency Min 2X/week   Barriers to discharge        Co-evaluation               AM-PAC PT "6 Clicks" Mobility  Outcome Measure Help needed turning from your back to your side while in a flat bed without using bedrails?: A Little Help needed moving from lying on your back to sitting on the side of a flat bed without using bedrails?: A Little Help needed moving to and from a bed to a chair (including a wheelchair)?: A Little Help needed standing up from a chair using your arms (e.g., wheelchair or bedside chair)?: A Lot Help needed to walk in hospital room?: A Little Help needed climbing 3-5 steps with a railing? : A Lot 6 Click Score: 16    End of Session Equipment Utilized During Treatment:  Gait belt Activity Tolerance: Patient tolerated treatment well Patient left: in chair;with call bell/phone within reach;with chair alarm set Nurse Communication: Mobility status PT Visit Diagnosis: Unsteadiness on feet (R26.81);Difficulty in walking, not elsewhere classified (R26.2);Muscle weakness (generalized) (M62.81)    Time: 3779-3968 PT Time Calculation (min) (ACUTE ONLY): 31 min   Charges:   PT Evaluation $PT Eval Moderate Complexity: 1 Mod PT Treatments $Gait Training: 23-37 mins        Arretta Toenjes M. Fairly IV, PT, DPT Physical Therapist- [facility]  07/17/2021, 3:16 PM

## 2021-07-17 NOTE — Progress Notes (Addendum)
07/17/2021  Subjective: Patient was started on clear liquid diet yesterday.  Today, the patient reports some nausea / indigestion symptoms, and reports her abdomen is more distended.  No flatus or BM.  Pain has improved compared to admission.  Due to new distention, repeat CT scan was obtained this morning.  I have personally viewed the images and discussed them with Dr. Pascal Lux.  Vital signs: Temp:  [98 F (36.7 C)-98.5 F (36.9 C)] 98 F (36.7 C) (12/02 0748) Pulse Rate:  [72-80] 80 (12/02 0748) Resp:  [16-20] 16 (12/02 0748) BP: (156-163)/(54-72) 160/54 (12/02 0748) SpO2:  [92 %-95 %] 92 % (12/02 0748)   Intake/Output: 12/01 0701 - 12/02 0700 In: 2538.3 [P.O.:660; IV Piggyback:1878.3] Out: -  Last BM Date: 07/12/21  Physical Exam: Constitutional:  No acute distress Abdomen:  soft, distended, with some mild tenderness to palpation, not peritoneal.  Labs:  Recent Labs    07/16/21 0456 07/17/21 0506  WBC 10.6* 13.7*  HGB 11.3* 9.9*  HCT 33.6* 30.0*  PLT 161 169   Recent Labs    07/16/21 0456 07/17/21 0506  NA 135 136  K 4.5 4.2  CL 108 107  CO2 19* 20*  GLUCOSE 96 104*  BUN 52* 58*  CREATININE 2.34* 2.37*  CALCIUM 8.2* 8.1*   No results for input(s): LABPROT, INR in the last 72 hours.  Imaging: CT ABDOMEN PELVIS WO CONTRAST  Result Date: 07/17/2021 CLINICAL DATA:  History of previous episodes of mesenteric ischemia complicated by pneumatosis or portal venous gas. Patient has elected to pursue conservative management and while initially was clinically improving has experienced recurrent abdominal pain with progression of her diet. EXAM: CT ABDOMEN AND PELVIS WITHOUT CONTRAST TECHNIQUE: Multidetector CT imaging of the abdomen and pelvis was performed following the standard protocol without IV contrast. COMPARISON:  CT abdomen pelvis-07/14/2021; 02/26/2021; 02/20/2021; 02/18/2021 FINDINGS: The lack of intravenous contrast limits the ability to evaluate solid abdominal  organs. Lower chest: Limited visualization of the lower thorax demonstrates development small/trace bilateral pleural effusions with associated bibasilar opacities, left greater than right. Normal heart size. Exuberant calcifications involving the mitral valve annulus. Calcifications involving the aortic root. Trace amount of pericardial fluid, presumably physiologic. There is mild diffuse decreased attenuation intra cardiac blood pool suggestive of anemia. Hepatobiliary: Normal hepatic contour. Post cholecystectomy. No ascites. Pancreas: Normal noncontrast appearance of the pancreas. Spleen: Normal noncontrast appearance of the spleen. Note is made of a small splenule about the anterior aspect of the spleen. Adrenals/Urinary Tract: The left kidney remains slightly atrophic in comparison to the right. No evidence of nephrolithiasis. There is a minimal amount of likely age and body habitus related grossly symmetric perinephric stranding. No urinary obstruction. Mild thickening of the medial limb of the left adrenal gland without discrete nodule. Normal noncontrast appearance of the right adrenal gland. Normal noncontrast appearance of the urinary bladder given degree of distention. Stomach/Bowel: There is moderate-to-marked upstream gas and fluid distention of the small bowel with apparent transition site identified within the left mid abdomen (representative coronal image 36, series 5), the etiology of which is not depicted on this examination. There is wall thickening involving a moderate length of the mid jejunum within the midline of the lower abdomen/pelvis (coronal images 26, 30, 38 and 46, series 5), at the location of previously questioned pneumatosis. Previously questioned pneumatosis appears to have resolved in the interval as the crescentic locules of air within the cecum and ascending colon are favored to be intraluminal. Interval resolution of previously  noted portal venous gas. Progressive ill-defined  stranding within the root of the abdominal mesentery. No pneumoperitoneum or evidence of perforation. Redemonstrated extensive colonic diverticulosis, primarily involving the distal descending and sigmoid colon, without evidence superimposed acute diverticulitis on this noncontrast examination. Vascular/Lymphatic: Large amount of irregular predominantly calcified atherosclerotic plaque within a tortuous thoracic aorta. No bulky retroperitoneal, mesenteric, pelvic or inguinal lymphadenopathy. Reproductive: Normal noncontrast appearance of the pelvic organs for age. No discrete adnexal lesions. No free fluid the pelvic cul-de-sac. Other: Diffuse body wall anasarca. Mesenteric fat containing supraumbilical midline ventral abdominal wall hernia, unchanged. Musculoskeletal: No acute or aggressive osseous abnormalities. Moderate severe multilevel lumbar spine DDD, worse at L3-L4 and to a lesser extent, L1-L2 with disc space height loss, endplate irregularity and sclerosis. Moderate severe degenerative change the bilateral hips, right greater than left. IMPRESSION: 1. Moderate distension of the upstream small bowel with apparent transition site within the left mid hemiabdomen, the etiology of which is not depicted on this examination, potentially secondary to adhesions. Continued attention on serial abdominal radiography could be performed as indicated. 2. Wall thickening involving a moderate length of the mid jejunum with resolution of previously questioned pneumatosis and extensive portal venous gas. No evidence of perforation or definable/drainable fluid collection within the abdomen or pelvis. 3. Extensive colonic diverticulosis without evidence superimposed acute diverticulitis on this noncontrast examination. 4. Extensive atherosclerotic plaque within a normal caliber abdominal aorta. Aortic Atherosclerosis (ICD10-I70.0). 5. Interval development of small/trace bilateral effusions with associated bibasilar opacities,  left greater than right. Above findings discussed with Dr. Hampton Abbot at the time of examination completion Electronically Signed   By: Sandi Mariscal M.D.   On: 07/17/2021 13:05    Assessment/Plan: This is a 87 y.o. female with ischemic enteritis  --CT scan obtained this morning shows distention of proximal small bowel loops with transition point possibly concerning for developing SBO.  She had had a cholecystectomy in the past which could cause adhesions.  Given her recent ischemic enteritis, there may be some narrowing of the small bowel from inflammatory changes, vs there could be an ileus developing as well.  The pneumatosis and portal venous gas has resolved --Discussed with the patient and her son, Dr. Janne Napoleon, that given the distention, I will make her again NPO.  Continue IV fluids for now.  If the distention is getting worse, or if she starts having more nausea/emesis, discussed with them that she would require NG tube placement for decompression. --OK to ambulate with assistance and be OOB to chair. --Discussed with primary team and nursing team.  I spent 25 minutes dedicated to the care of this patient on the date of this encounter to include pre-visit review of records, face-to-face time with the patient discussing diagnosis and management, and any post-visit coordination of care.  Melvyn Neth, [facility]

## 2021-07-17 NOTE — Progress Notes (Signed)
PT Cancellation Note  Patient Details Name: Jamie Hodges MRN: [id number] DOB: 1928-06-07   Cancelled Treatment:    Reason Eval/Treat Not Completed: Patient declined, no reason specified. Pt received stating PT to re-attempt later this p.m. Is feeling better but is drinking her contrast fluid for Abdominal CT later this a.m. Reports she will participate in therapy after procedure.    Salem Caster. Fairly IV, PT, DPT Physical Therapist- [facility]  07/17/2021, 9:37 AM

## 2021-07-18 ENCOUNTER — Inpatient Hospital Stay: Payer: Self-pay

## 2021-07-18 LAB — CBC
HCT: 28.7 % — ABNORMAL LOW (ref 36.0–46.0)
Hemoglobin: 9.2 g/dL — ABNORMAL LOW (ref 12.0–15.0)
MCH: 31.4 pg (ref 26.0–34.0)
MCHC: 32.1 g/dL (ref 30.0–36.0)
MCV: 98 fL (ref 80.0–100.0)
Platelets: 187 10*3/uL (ref 150–400)
RBC: 2.93 MIL/uL — ABNORMAL LOW (ref 3.87–5.11)
RDW: 14.6 % (ref 11.5–15.5)
WBC: 8.3 10*3/uL (ref 4.0–10.5)
nRBC: 0 % (ref 0.0–0.2)

## 2021-07-18 LAB — BASIC METABOLIC PANEL
Anion gap: 10 (ref 5–15)
BUN: 56 mg/dL — ABNORMAL HIGH (ref 8–23)
CO2: 20 mmol/L — ABNORMAL LOW (ref 22–32)
Calcium: 8 mg/dL — ABNORMAL LOW (ref 8.9–10.3)
Chloride: 107 mmol/L (ref 98–111)
Creatinine, Ser: 1.93 mg/dL — ABNORMAL HIGH (ref 0.44–1.00)
GFR, Estimated: 24 mL/min — ABNORMAL LOW (ref >60)
Glucose, Bld: 94 mg/dL (ref 70–99)
Potassium: 3.8 mmol/L (ref 3.5–5.1)
Sodium: 137 mmol/L (ref 135–145)

## 2021-07-18 LAB — MAGNESIUM: Magnesium: 1.9 mg/dL (ref 1.7–2.4)

## 2021-07-18 LAB — PHOSPHORUS: Phosphorus: 3 mg/dL (ref 2.5–4.6)

## 2021-07-18 MED ORDER — SODIUM CHLORIDE 0.9% FLUSH: 10.0000 mL | INTRAVENOUS | Status: DC | PRN

## 2021-07-18 MED ORDER — CHLORHEXIDINE GLUCONATE CLOTH 2 % EX PADS
6.0000 | MEDICATED_PAD | Freq: Every day | CUTANEOUS | Status: DC
  Administered 2021-07-19 – 2021-07-24 (×6): 6 via TOPICAL

## 2021-07-18 MED ORDER — SODIUM CHLORIDE 0.9% FLUSH
10.0000 mL | Freq: Two times a day (BID) | INTRAVENOUS | Status: DC
  Administered 2021-07-18: 10 mL
  Administered 2021-07-19: 09:00:00 40 mL
  Administered 2021-07-19 – 2021-07-24 (×8): 10 mL

## 2021-07-18 MED ORDER — INSULIN ASPART 100 UNIT/ML IJ SOLN
0.0000 [IU] | Freq: Four times a day (QID) | INTRAMUSCULAR | Status: DC
  Administered 2021-07-19 (×3): 2 [IU] via SUBCUTANEOUS
  Administered 2021-07-20: 1 [IU] via SUBCUTANEOUS
  Administered 2021-07-20 – 2021-07-21 (×3): 2 [IU] via SUBCUTANEOUS
  Administered 2021-07-21 – 2021-07-23 (×5): 1 [IU] via SUBCUTANEOUS
  Filled 2021-07-18 (×10): qty 1

## 2021-07-18 MED ORDER — MELATONIN 5 MG PO TABS
2.5000 mg | ORAL_TABLET | Freq: Once | ORAL | Status: AC
  Administered 2021-07-18: 2.5 mg via ORAL
  Filled 2021-07-18: qty 1

## 2021-07-18 MED ORDER — LACTATED RINGERS IV SOLN: INTRAVENOUS | Status: AC

## 2021-07-18 MED ORDER — TRAVASOL 10 % IV SOLN
INTRAVENOUS | Status: AC
  Filled 2021-07-18: qty 504

## 2021-07-18 NOTE — Progress Notes (Signed)
Spoke with son, Dr. Pauline Aus, per pt request re PICC line.  Son to call his mom and explain procedure.

## 2021-07-18 NOTE — Progress Notes (Signed)
PHARMACY - TOTAL PARENTERAL NUTRITION CONSULT NOTE   Indication: Prolonged ileus  Patient Measurements: Height: 5\' 7"  (170.2 cm) Weight: 68 kg (150 lb) IBW/kg (Calculated) : 61.6 TPN AdjBW (KG): 68 Body mass index is 23.49 kg/m.  Assessment:  87 y.o. female w/ PMH of  atrial fibrillation on Eliquis, CAD, hypertension, hyperlipidemia, ischemic colitis brought to the ED via EMS from [facility] with a chief complaint of generalized abdominal pain, nausea and diarrhea.  TPN is being started since patient has been 5 to 7 days without adequate nutrition.  Glucose / Insulin: non-diabetic, not requiring insulin  Electrolytes: stable, wnl Renal: SCr 2.44-->1.93 Hepatic: LFTs wnl Intake / Output; MIVF: lactated ringers at 75 mL/hr GI Imaging: 12/02 CT abd: Extensive colonic diverticulosis without evidence superimposed acute diverticulitis on noncontrast examination. GI Surgeries / Procedures: none recent  Central access: pending TPN start date: 07/18/21 (pending PICC placement)  Nutritional Goals: Goal TPN rate is 84 mL/hr (provides 100 g of protein and 2200 kcals per day)  RD Assessment: pending   Current Nutrition:  NPO  Plan:  Start TPN at 42 mL/hr at 1800 (total volume including overfill 1108 mL) Nutritional components: Amino acids (using 10% Travasol): 50.4 grams Dextrose: 169 grams Lipids (using 20% SMOFlipid): 33.2 grams kCal: 1109/24 hours Electrolytes in TPN (standard): Na 38mEq/L, K 79mEq/L, Ca 24mEq/L, Mg 53mEq/L, and Phos 86mmol/L. Cl:Ac 1:1 Add standard MVI and trace elements to TPN Initiate Sensitive q6h SSI and adjust as needed  Reduce MIVF to 33 mL/hr at 1800 Monitor TPN labs on Mon/Thurs, daily until stable  Dallie Piles 07/18/2021,10:41 AM

## 2021-07-18 NOTE — Progress Notes (Signed)
Mobility Specialist - Progress Note   07/18/21 1500  Mobility  Activity Ambulated in hall  Level of Assistance Standby assist, set-up cues, supervision of patient - no hands on  Assistive Device Front wheel walker  Distance Ambulated (ft) 40 ft  Mobility Out of bed to chair with meals;Ambulated with assistance in hallway  Mobility Response Tolerated well  Mobility performed by Mobility specialist  $Mobility charge 1 Mobility    During mobility: 83 HR, 86% SpO2 Post-mobility: 77 HR, 90% SpO2   Pt ambulated in hallway with supervision. Self-determined distance as pt states she "didn't want to push it". O2 desat to 86%, no complaints. Pt left in chair with alarm set.    Kathee Delton Mobility Specialist 07/18/21, 3:08 PM

## 2021-07-18 NOTE — Progress Notes (Signed)
Peripherally Inserted Central Catheter Placement  The IV Nurse has discussed with the patient and/or persons authorized to consent for the patient, the purpose of this procedure and the potential benefits and risks involved with this procedure.  The benefits include less needle sticks, lab draws from the catheter, and the patient may be discharged home with the catheter. Risks include, but not limited to, infection, bleeding, blood clot (thrombus formation), and puncture of an artery; nerve damage and irregular heartbeat and possibility to perform a PICC exchange if needed/ordered by physician.  Alternatives to this procedure were also discussed.  Bard Power PICC patient education guide, fact sheet on infection prevention and patient information card has been provided to patient /or left at bedside.  Pt verbalizes uncertainty re procedure and requests to have son Dr Mikki Santee to give consent.  Spoke with him on the phone again at her bedside.  Son and pt agreeable to procedure without hesitation.  PICC Placement Documentation  PICC Double Lumen 07/18/21 PICC Right Brachial 38 cm 0 cm (Active)  Indication for Insertion or Continuance of Line Administration of hyperosmolar/irritating solutions (i.e. TPN, Vancomycin, etc.) 07/18/21 1818  Exposed Catheter (cm) 0 cm 07/18/21 1818  Site Assessment Clean;Dry;Intact 07/18/21 1818  Lumen #1 Status Flushed;Saline locked;Blood return noted 07/18/21 1818  Lumen #2 Status Flushed;Saline locked;Blood return noted 07/18/21 1818  Dressing Type Transparent;Securing device 07/18/21 1818  Dressing Status Clean;Dry;Intact 07/18/21 1818  Antimicrobial disc in place? Yes 07/18/21 1818  Safety Lock Not Applicable 44/51/46 0479  Line Care Connections checked and tightened 07/18/21 1818  Line Adjustment (NICU/IV Team Only) Yes 07/18/21 1818  Dressing Intervention New dressing 07/18/21 1818  Dressing Change Due 07/25/21 07/18/21 1818       Rolena Infante 07/18/2021,  6:19 PM

## 2021-07-18 NOTE — Progress Notes (Signed)
Patient had a large formed bowel movement this morning.  Will continue to monitor. Christene Slates  07/18/2021  6:58 AM

## 2021-07-18 NOTE — Progress Notes (Signed)
PROGRESS NOTE  Jamie Hodges ASN:[id number] DOB: 1928/02/09 DOA: 07/14/2021 PCP: [name], MD  Brief History   Jamie Hodges is a 87 y.o. female with medical history significant for Paroxysmal atrial fibrillation, HTN, colonic ischemia, CKD 3B, anemia of chronic disease who presented to the ED from [facility] with a complaint of generalized abdominal pain, nausea and diarrhea typical for her episodes of ischemic colitis.  Patient has refused surgery in the past and continues to decline surgical intervention.  The patient is being treated with IV Rocephin and Flagyl. General surgery is following. Her diet has been advanced to clear liquids, but the patient states that she has no appetite.  She is having flatus, but no BM.  Repeat CT today demonstrated likely ileus. Surgery prefers to treat conservatively at this point, but patient may require NGT to decompress.  The patient continues to display symptoms of SBO vs ileus as was demonstrated on Ct abdomen and pelvis yesterday. She remains NPO. As her last meal was 5-7 days ago, she will be started on TNP. PICC line ordered.  Consultants  Palliative care  Procedures  None  Antibiotics   Anti-infectives (From admission, onward)    Start     Dose/Rate Route Frequency Ordered Stop   07/16/21 1000  cefTRIAXone (ROCEPHIN) 2 g in sodium chloride 0.9 % 100 mL IVPB        2 g 200 mL/hr over 30 Minutes Intravenous Every 24 hours 07/15/21 0903     07/15/21 0200  ceFEPIme (MAXIPIME) 2 g in sodium chloride 0.9 % 100 mL IVPB  Status:  Discontinued        2 g 200 mL/hr over 30 Minutes Intravenous Every 24 hours 07/14/21 1033 07/15/21 0903   07/14/21 2000  metroNIDAZOLE (FLAGYL) IVPB 500 mg        500 mg 100 mL/hr over 60 Minutes Intravenous Every 12 hours 07/14/21 1033     07/14/21 0800  meropenem (MERREM) 500 mg in sodium chloride 0.9 % 100 mL IVPB  Status:  Discontinued        500 mg 200 mL/hr over 30 Minutes Intravenous Every 12 hours  07/14/21 0529 07/14/21 0957   07/14/21 0200  ceFEPIme (MAXIPIME) 2 g in sodium chloride 0.9 % 100 mL IVPB        2 g 200 mL/hr over 30 Minutes Intravenous  Once 07/14/21 0156 07/14/21 0306   07/14/21 0200  metroNIDAZOLE (FLAGYL) IVPB 500 mg        500 mg 100 mL/hr over 60 Minutes Intravenous  Once 07/14/21 0156 07/14/21 0334   07/14/21 0200  vancomycin (VANCOCIN) IVPB 1000 mg/200 mL premix        1,000 mg 200 mL/hr over 60 Minutes Intravenous  Once 07/14/21 0156 07/14/21 0443        Subjective  The patient is lying in bed. She states although she is feeling better, she continues to have abdominal pain and bloating.  Objective   Vitals:  Vitals:   07/18/21 0423 07/18/21 0845  BP: (!) 130/56 (!) 143/62  Pulse: 75 71  Resp: 16 18  Temp: 98.1 F (36.7 C) 97.7 F (36.5 C)  SpO2: 93% 90%    Exam:  Constitutional:  The patient is awake, alert, and oriented x 3. No acute distress. Respiratory:  No increased work of breathing. No wheezes, rales, or rhonchi No tactile fremitus Cardiovascular:  Regular rate and rhythm No murmurs, ectopy, or gallups. No lateral PMI. No thrills. Abdomen:  Abdomen is  soft, distended, and diffusely tenderNo hernias, masses, or organomegaly Hypo-active bowel sounds.  Musculoskeletal:  No cyanosis, clubbing, or edema Skin:  No rashes, lesions, ulcers palpation of skin: no induration or nodules Neurologic:  CN 2-12 intact Sensation all 4 extremities intact Psychiatric:  Mental status Mood, affect appropriate Orientation to person, place, time  judgment and insight appear intact  I have personally reviewed the following:   Today's Data  Vitals  Lab Data  CBC, BMP  Imaging  CT abdomen and pelvis  Cardiology Data  EKG  Scheduled Meds:  amiodarone  200 mg Oral Daily   apixaban  2.5 mg Oral BID   diltiazem  360 mg Oral Daily   [START ON 07/19/2021] insulin aspart  0-9 Units Subcutaneous Q6H   metoprolol tartrate  50 mg Oral BID    Continuous Infusions:  cefTRIAXone (ROCEPHIN)  IV 2 g (07/18/21 0839)   lactated ringers 75 mL/hr at 07/18/21 0834   lactated ringers     metronidazole 500 mg (07/18/21 0836)   TPN ADULT (ION)      Principal Problem:   Ischemic enteritis (HCC) Active Problems:   AF (paroxysmal atrial fibrillation) (HCC)   Atrial fibrillation (HCC)   CAD (coronary artery disease)   LOS: 4 days   A & P  Acute ischemic enteritis --pt continues to refuse surgical intervention --started on broad-spectrum abx Plan: --cont abx as ceftriaxone and flagyl, per pharm rec --NPO except sips of water and ice chips --cont LR@75  -IV pain meds and IV antiemetics By Ct this appears resolved. --palliative consult today  SBO vs Ileus: CT today indicates possible development of ileus. Pt is again NPO. Monitor. May require NGT to decompress.     AF (paroxysmal atrial fibrillation) ([place]) --resume home amiodarone, Cardizem and Lopressor --cont home Eliquis (with renal adjustment) -Heart rate is controlled.   Hypertension --resume home amiodarone, Cardizem and Lopressor. The patient is a little hypertensive today. Will add IV Hydralazine prn due to possibility that patient may not be absorbing oral antihypertensives.   AKI --Cr 1.33 on presentation.  Creatinine is improved from 2.44 to 2.37 today. Continue IV fluids. Monitor creatinine, electrolytes and volume status.   Lactic acidosis  2/2 bowel ischemia --cont LR@75   I have seen and examined this patient myself. I have spent 32 minutes in her evaluation and care.     DVT prophylaxis: RV:[id number] Code Status: DNR  Family Communication:  Level of care: Med-Surg Dispo:   The patient is from: [facility] Anticipated d/c is to: to be determined Anticipated d/c date is: undetermined Patient currently is not medically ready to d/c due to: bowel ischemia, NPO, on IV abx   Jamie Prine, DO [facility] Direct contact: see [email or web address]   7PM-7AM contact night coverage as above 07/18/2021, 3:46 PM  LOS: 2 days

## 2021-07-18 NOTE — TOC Progression Note (Signed)
Transition of Care [facility]) - Progression Note    Patient Details  Name: Jamie Hodges MRN: [id number] Date of Birth: 03-23-1928  Transition of Care [facility]) CM/SW [place], LCSW Phone Number: 07/18/2021, 11:38 AM  Clinical Narrative:   Met with patient at bedside. Patient says she wants home health with the agency she used last time ([facility]). Referral made to [facility] with [facility]. Asked Dr. Benny Lennert for orders.    Expected Discharge Plan: Home/Self Care Barriers to Discharge: Continued Medical Work up  Expected Discharge Plan and Services Expected Discharge Plan: Home/Self Care     Post Acute Care Choice:  (TBD) Living arrangements for the past 2 months: [place]                                       Social Determinants of Health (SDOH) Interventions    Readmission Risk Interventions No flowsheet data found.

## 2021-07-18 NOTE — Progress Notes (Signed)
Secure chat sent to dr Benny Lennert and Dr Windell Moment .  States ok to place PICC for TNA this pm.  Randa Ngo RN also notified.

## 2021-07-18 NOTE — Progress Notes (Signed)
Patient ID: Jamie Hodges, female   DOB: 05/04/1928, 87 y.o.   MRN: [id number]     [place] Hospital Day(s): 4.   Interval History: Patient seen and examined, no acute events or new complaints overnight. Patient reports feeling the same.  She denies significant abdominal pain.  She endorses that she has not felt passing gas.  Denies nausea either.  Patient endorses that last meal was 5 to 7 days ago.  Vital signs in last 24 hours: [min-max] current  Temp:  [97.7 F (36.5 C)-98.1 F (36.7 C)] 97.7 F (36.5 C) (12/03 0845) Pulse Rate:  [71-76] 71 (12/03 0845) Resp:  [16-20] 18 (12/03 0845) BP: (130-149)/(54-63) 143/62 (12/03 0845) SpO2:  [90 %-95 %] 90 % (12/03 0845)     Height: 5\' 7"  (170.2 cm) Weight: 68 kg BMI (Calculated): 23.49   Physical Exam:  Constitutional: alert, cooperative and no distress  Respiratory: breathing non-labored at rest  Cardiovascular: regular rate and sinus rhythm  Gastrointestinal: soft, non-tender, but distended  Labs:  CBC Latest Ref Rng & Units 07/18/2021 07/17/2021 07/16/2021  WBC 4.0 - 10.5 K/uL 8.3 13.7(H) 10.6(H)  Hemoglobin 12.0 - 15.0 g/dL 9.2(L) 9.9(L) 11.3(L)  Hematocrit 36.0 - 46.0 % 28.7(L) 30.0(L) 33.6(L)  Platelets 150 - 400 K/uL 187 169 161   CMP Latest Ref Rng & Units 07/18/2021 07/17/2021 07/16/2021  Glucose 70 - 99 mg/dL 94 104(H) 96  BUN 8 - 23 mg/dL 56(H) 58(H) 52(H)  Creatinine 0.44 - 1.00 mg/dL 1.93(H) 2.37(H) 2.34(H)  Sodium 135 - 145 mmol/L 137 136 135  Potassium 3.5 - 5.1 mmol/L 3.8 4.2 4.5  Chloride 98 - 111 mmol/L 107 107 108  CO2 22 - 32 mmol/L 20(L) 20(L) 19(L)  Calcium 8.9 - 10.3 mg/dL 8.0(L) 8.1(L) 8.2(L)  Total Protein 6.5 - 8.1 g/dL - - -  Total Bilirubin 0.3 - 1.2 mg/dL - - -  Alkaline Phos 38 - 126 U/L - - -  AST 15 - 41 U/L - - -  ALT 0 - 44 U/L - - -    Imaging studies: No new pertinent imaging studies   Assessment/Plan:  87 y.o. female with ischemic enteritis, complicated by pertinent comorbidities  including A. fib, hypertension, chronic kidney disease stage IIIb, anemia of chronic disease.  Patient admitted with ischemic enteritis with pneumatosis of the small intestine with portal venous gas on admission.  Initially she was getting better and clear liquid diet was tried but she got nauseous.  CT scan was repeated yesterday and it showed small bowel dilation consistent with small bowel obstruction versus ileus.  There was some improvement of the segment of the small intestine with pneumatosis.  I personally evaluated the images of the CT scan of the abdomen pelvis done yesterday and compared with the one done on admission.  On physical exam her abdomen is still distended and patient is not passing flatus.  I would recommend to keep n.p.o. to avoid getting nauseous and vomiting and aspiration.  The patient started passing gas and her abdomen gets better we can try clear liquids again.  I would recommend to consider TPN since patient has been 5 to 7 days without adequate nutrition.  I encouraged the patient to ambulate.  Appreciate hospitalist management of medical comorbidities.  We will continue to follow closely.  Arnold Long, MD

## 2021-07-18 NOTE — Progress Notes (Signed)
Mobility Specialist - Progress Note   07/18/21 1700  Mobility  Activity Transferred:  Chair to bed  Level of Assistance Standby assist, set-up cues, supervision of patient - no hands on  Assistive Device Front wheel walker  Distance Ambulated (ft) 2 ft  Mobility Sit up in bed/chair position for meals;Ambulated with assistance in room  Mobility Response Tolerated well  Mobility performed by Mobility specialist  $Mobility charge 1 Mobility    Pt transferred chair-bed with supervision. O2 desat to 86% on RA, pt denied SOB. Pt left in bed with alarm set.    Kathee Delton Mobility Specialist 07/18/21, 5:02 PM

## 2021-07-18 NOTE — Plan of Care (Signed)

## 2021-07-19 ENCOUNTER — Inpatient Hospital Stay: Payer: Medicare Other

## 2021-07-19 DIAGNOSIS — K559 Vascular disorder of intestine, unspecified: Secondary | ICD-10-CM | POA: Diagnosis not present

## 2021-07-19 LAB — CBC
HCT: 30.7 % — ABNORMAL LOW (ref 36.0–46.0)
Hemoglobin: 10.1 g/dL — ABNORMAL LOW (ref 12.0–15.0)
MCH: 32.2 pg (ref 26.0–34.0)
MCHC: 32.9 g/dL (ref 30.0–36.0)
MCV: 97.8 fL (ref 80.0–100.0)
Platelets: 222 10*3/uL (ref 150–400)
RBC: 3.14 MIL/uL — ABNORMAL LOW (ref 3.87–5.11)
RDW: 14.7 % (ref 11.5–15.5)
WBC: 7.8 10*3/uL (ref 4.0–10.5)
nRBC: 0 % (ref 0.0–0.2)

## 2021-07-19 LAB — TRIGLYCERIDES: Triglycerides: 114 mg/dL (ref <150)

## 2021-07-19 LAB — BASIC METABOLIC PANEL
Anion gap: 9 (ref 5–15)
BUN: 44 mg/dL — ABNORMAL HIGH (ref 8–23)
CO2: 22 mmol/L (ref 22–32)
Calcium: 8 mg/dL — ABNORMAL LOW (ref 8.9–10.3)
Chloride: 106 mmol/L (ref 98–111)
Creatinine, Ser: 1.56 mg/dL — ABNORMAL HIGH (ref 0.44–1.00)
GFR, Estimated: 31 mL/min — ABNORMAL LOW (ref >60)
Glucose, Bld: 178 mg/dL — ABNORMAL HIGH (ref 70–99)
Potassium: 3.9 mmol/L (ref 3.5–5.1)
Sodium: 137 mmol/L (ref 135–145)

## 2021-07-19 LAB — GLUCOSE, CAPILLARY
Glucose-Capillary: 127 mg/dL — ABNORMAL HIGH (ref 70–99)
Glucose-Capillary: 161 mg/dL — ABNORMAL HIGH (ref 70–99)
Glucose-Capillary: 165 mg/dL — ABNORMAL HIGH (ref 70–99)
Glucose-Capillary: 176 mg/dL — ABNORMAL HIGH (ref 70–99)
Glucose-Capillary: 180 mg/dL — ABNORMAL HIGH (ref 70–99)

## 2021-07-19 LAB — MAGNESIUM: Magnesium: 2 mg/dL (ref 1.7–2.4)

## 2021-07-19 LAB — PHOSPHORUS: Phosphorus: 3.3 mg/dL (ref 2.5–4.6)

## 2021-07-19 MED ORDER — IPRATROPIUM-ALBUTEROL 0.5-2.5 (3) MG/3ML IN SOLN
3.0000 mL | Freq: Four times a day (QID) | RESPIRATORY_TRACT | Status: DC
  Administered 2021-07-19 (×2): 3 mL via RESPIRATORY_TRACT
  Filled 2021-07-19 (×2): qty 3

## 2021-07-19 MED ORDER — SODIUM CHLORIDE 0.9 % IV SOLN
500.0000 mg | Freq: Once | INTRAVENOUS | Status: AC
  Administered 2021-07-19: 15:00:00 500 mg via INTRAVENOUS
  Filled 2021-07-19: qty 500

## 2021-07-19 MED ORDER — FUROSEMIDE 10 MG/ML IJ SOLN
20.0000 mg | Freq: Once | INTRAMUSCULAR | Status: AC
  Administered 2021-07-19: 15:00:00 20 mg via INTRAVENOUS
  Filled 2021-07-19: qty 4

## 2021-07-19 MED ORDER — IPRATROPIUM-ALBUTEROL 0.5-2.5 (3) MG/3ML IN SOLN
3.0000 mL | RESPIRATORY_TRACT | Status: DC | PRN
  Administered 2021-07-22 – 2021-07-24 (×5): 3 mL via RESPIRATORY_TRACT
  Filled 2021-07-19 (×4): qty 3

## 2021-07-19 MED ORDER — SODIUM CHLORIDE 0.9 % IV SOLN: INTRAVENOUS | Status: DC | PRN

## 2021-07-19 MED ORDER — DEXTROSE 5 % IV SOLN
250.0000 mg | INTRAVENOUS | Status: DC
  Administered 2021-07-20: 250 mg via INTRAVENOUS
  Filled 2021-07-19 (×2): qty 250

## 2021-07-19 MED ORDER — TRAVASOL 10 % IV SOLN
INTRAVENOUS | Status: AC
  Filled 2021-07-19: qty 1008

## 2021-07-19 NOTE — Progress Notes (Signed)
PROGRESS NOTE  Korine Winton WUJ:[id number] DOB: 11/15/1927 DOA: 07/14/2021 PCP: [name], MD  Brief History   Aradia Estey is a 87 y.o. female with medical history significant for Paroxysmal atrial fibrillation, HTN, colonic ischemia, CKD 3B, anemia of chronic disease who presented to the ED from [facility] with a complaint of generalized abdominal pain, nausea and diarrhea typical for her episodes of ischemic colitis.  Patient has refused surgery in the past and continues to decline surgical intervention.  The patient is being treated with IV Rocephin and Flagyl. General surgery is following. Her diet has been advanced to clear liquids, but the patient states that she has no appetite.  She is having flatus, but no BM.  Repeat CT today demonstrated likely ileus. Surgery prefers to treat conservatively at this point, but patient may require NGT to decompress.  The patient continues to display symptoms of SBO vs ileus as was demonstrated on Ct abdomen and pelvis yesterday. She remains NPO. As her last meal was 5-7 days ago, she will be started on TNP. PICC line has been placed.  This morning the patient remains distended and uncomfortably. Dr. Peyton Najjar has ordered NGT to be placed. CXR has been ordered to confirm placement.   The patient's daughter in law is at bedside. She states that the patient has been having audible wheezing this am.  Consultants  Palliative care General Surgery  Procedures  None  Antibiotics   Anti-infectives (From admission, onward)    Start     Dose/Rate Route Frequency Ordered Stop   07/20/21 1200  azithromycin (ZITHROMAX) 250 mg in dextrose 5 % 125 mL IVPB        250 mg 125 mL/hr over 60 Minutes Intravenous Every 24 hours 07/19/21 1307 07/24/21 1159   07/19/21 1400  azithromycin (ZITHROMAX) 500 mg in sodium chloride 0.9 % 250 mL IVPB        500 mg 250 mL/hr over 60 Minutes Intravenous  Once 07/19/21 1306     07/16/21 1000  cefTRIAXone (ROCEPHIN) 2  g in sodium chloride 0.9 % 100 mL IVPB        2 g 200 mL/hr over 30 Minutes Intravenous Every 24 hours 07/15/21 0903     07/15/21 0200  ceFEPIme (MAXIPIME) 2 g in sodium chloride 0.9 % 100 mL IVPB  Status:  Discontinued        2 g 200 mL/hr over 30 Minutes Intravenous Every 24 hours 07/14/21 1033 07/15/21 0903   07/14/21 2000  metroNIDAZOLE (FLAGYL) IVPB 500 mg        500 mg 100 mL/hr over 60 Minutes Intravenous Every 12 hours 07/14/21 1033     07/14/21 0800  meropenem (MERREM) 500 mg in sodium chloride 0.9 % 100 mL IVPB  Status:  Discontinued        500 mg 200 mL/hr over 30 Minutes Intravenous Every 12 hours 07/14/21 0529 07/14/21 0957   07/14/21 0200  ceFEPIme (MAXIPIME) 2 g in sodium chloride 0.9 % 100 mL IVPB        2 g 200 mL/hr over 30 Minutes Intravenous  Once 07/14/21 0156 07/14/21 0306   07/14/21 0200  metroNIDAZOLE (FLAGYL) IVPB 500 mg        500 mg 100 mL/hr over 60 Minutes Intravenous  Once 07/14/21 0156 07/14/21 0334   07/14/21 0200  vancomycin (VANCOCIN) IVPB 1000 mg/200 mL premix        1,000 mg 200 mL/hr over 60 Minutes Intravenous  Once 07/14/21 0156 07/14/21  9211        Subjective  The patient is lying in bed. She states that she is not doing well. She expresses that she is having some difficulty breathing.  Objective   Vitals:  Vitals:   07/19/21 0423 07/19/21 1300  BP: (!) 149/63   Pulse: 90 99  Resp: 20 18  Temp: 98.3 F (36.8 C)   SpO2: 94% 92%    Exam:  Constitutional:  The patient is awake, alert, and oriented x 3. Mild distress with speech from dyspnea. Respiratory:  Mildly increased work of breathing. Positive for wheezes and rhonchi throughout with coarse rales No tactile fremitus Cardiovascular:  Regular rate and rhythm No murmurs, ectopy, or gallups. No lateral PMI. No thrills. Abdomen:  Abdomen is soft, distended, and diffusely tenderNo hernias, masses, or organomegaly Hypo-active bowel sounds.  Musculoskeletal:  No cyanosis,  clubbing, or edema Skin:  No rashes, lesions, ulcers palpation of skin: no induration or nodules Neurologic:  CN 2-12 intact Sensation all 4 extremities intact Psychiatric:  Mental status Mood, affect appropriate Orientation to person, place, time  judgment and insight appear intact  I have personally reviewed the following:   Today's Data  Vitals  Lab Data  CBC, BMP  Imaging  CT abdomen and pelvis CXR  Cardiology Data  EKG  Scheduled Meds:  amiodarone  200 mg Oral Daily   apixaban  2.5 mg Oral BID   Chlorhexidine Gluconate Cloth  6 each Topical Daily   diltiazem  360 mg Oral Daily   furosemide  20 mg Intravenous Once   insulin aspart  0-9 Units Subcutaneous Q6H   ipratropium-albuterol  3 mL Nebulization Q6H   metoprolol tartrate  50 mg Oral BID   sodium chloride flush  10-40 mL Intracatheter Q12H   Continuous Infusions:  [START ON 07/20/2021] azithromycin     azithromycin     cefTRIAXone (ROCEPHIN)  IV 2 g (07/19/21 0905)   lactated ringers 33 mL/hr at 07/18/21 1903   metronidazole 500 mg (07/19/21 0910)   TPN ADULT (ION) 42 mL/hr at 07/18/21 1856   TPN ADULT (ION)      Principal Problem:   Ischemic enteritis ([place]) Active Problems:   AF (paroxysmal atrial fibrillation) (HCC)   Atrial fibrillation (HCC)   CAD (coronary artery disease)   LOS: 5 days   A & P  Acute ischemic enteritis --pt continues to refuse surgical intervention --started on broad-spectrum abx Plan: --cont abx as ceftriaxone and flagyl, per pharm rec --NPO except sips of water and ice chips --cont LR@75  -IV pain meds and IV antiemetics By Ct this appears resolved. --palliative consult today  SBO vs Ileus: CT today indicates possible development of ileus. Pt is again NPO. Monitor. NGT is in good placement for decompression with suction  Wheezing and dyspnea:  DDx: Bronchitis and pulmonary edema. CXR appears wet to me. Also possible bronchitis. No consolidate seent. I have added  azithromycin for possible bronchitis and lasix. Will monitor for response.    AF (paroxysmal atrial fibrillation) ([place]) --resume home amiodarone, Cardizem and Lopressor --cont home Eliquis (with renal adjustment) -Heart rate is controlled.   Hypertension --resume home amiodarone, Cardizem and Lopressor. The patient is a little hypertensive today. Will add IV Hydralazine prn due to possibility that patient may not be absorbing oral antihypertensives.   AKI --Cr 1.33 on presentation.  Creatinine is improved from 2.44 to 1.99 on 07/18/2021 and 1.56 today. Monitor creatinine, electrolytes and volume status.   Lactic acidosis  2/2  bowel ischemia --cont LR@75   I have seen and examined this patient myself. I have spent 44 minutes in her evaluation and care. More than 1/2 of this has been spent in counseling with the patient's DIL at bedside and her son Mikki Santee.   DVT prophylaxis: IX:[id number] Code Status: DNR  Family Communication:  DIL at bedside and so, Mikki Santee, over the phone. Level of care: Med-Surg Dispo:   The patient is from: [facility] Anticipated d/c is to: to be determined Anticipated d/c date is: undetermined Patient currently is not medically ready to d/c due to: bowel ischemia, NPO, on IV abx  Cannon Quinton, DO [facility] Direct contact: see [email or web address]  7PM-7AM contact night coverage as above 07/19/2021, 2:50 PM  LOS: 2 days

## 2021-07-19 NOTE — Progress Notes (Signed)
Doctor on call notified of patient's current status.  Stat KUB ordered. Christene Slates

## 2021-07-19 NOTE — Progress Notes (Signed)
PHARMACY - TOTAL PARENTERAL NUTRITION CONSULT NOTE   Indication: Prolonged ileus  Patient Measurements: Height: 5\' 7"  (170.2 cm) Weight: 68 kg (150 lb) IBW/kg (Calculated) : 61.6 TPN AdjBW (KG): 68 Body mass index is 23.49 kg/m.  Assessment:  87 y.o. female w/ PMH of  atrial fibrillation on Eliquis, CAD, hypertension, hyperlipidemia, ischemic colitis brought to the ED via EMS from [facility] with a chief complaint of generalized abdominal pain, nausea and diarrhea.  TPN is being started since patient has been 5 to 7 days without adequate nutrition.  Glucose / Insulin: BG previous 24h: 127 - 178 (required 2 units SSI) Electrolytes: stable, wnl Renal: SCr 2.44-->1.56 Hepatic: LFTs, TG wnl Intake / Output; MIVF: lactated ringers at 33 mL/hr GI Imaging: 12/02 CT abd: Extensive colonic diverticulosis without evidence superimposed acute diverticulitis on noncontrast examination. GI Surgeries / Procedures: none recent  Central access: 07/18/21 TPN start date: 07/18/21   Nutritional Goals: Goal TPN rate is 84 mL/hr (provides 101 g of protein and 2271 kcals per day)  RD Assessment: pending   Current Nutrition:  NPO  Plan:  advance TPN to 84 mL/hr at 1800 (total volume including overfill 2116 mL) Nutritional components: Amino acids (using 10% Travasol): 100.8 grams Dextrose: 338.7 grams Lipids (using 20% SMOFlipid): 66.6 grams kCal: 2271/24 hours Electrolytes in TPN (standard): Na 33mEq/L, K 61mEq/L, Ca 91mEq/L, Mg 48mEq/L, and Phos 69mmol/L. Cl:Ac 1:1 Add standard MVI and trace elements to TPN continue Sensitive q6h SSI and adjust as needed  stop MIVF at 1800 Monitor TPN labs on Mon/Thurs, daily until stable  Dallie Piles 07/19/2021,8:57 AM

## 2021-07-19 NOTE — Progress Notes (Signed)
Patient is not feeling well.  Complaining of nausea.  Given zofran. Will recheck in an hour.  Asked patient if NG tube placement was ever discussed but she doesn't want to do that right now.  Will continue to monitor.  Christene Slates  2:44 AM  07/19/2021

## 2021-07-19 NOTE — Progress Notes (Signed)
Initial Nutrition Assessment  DOCUMENTATION CODES:   Not applicable  INTERVENTION:   TPN per pharmacy   Recommend thiamine 100mg  added to TPN daily x 3 days  Pt at high refeed risk; recommend monitor potassium, magnesium and phosphorus labs daily until stable  Daily weights   NUTRITION DIAGNOSIS:   Inadequate oral intake related to acute illness as evidenced by NPO status.  GOAL:   Patient will meet greater than or equal to 90% of their needs  MONITOR:   Diet advancement, Labs, Weight trends, Skin, I & O's  REASON FOR ASSESSMENT:   Consult New TPN/TNA  ASSESSMENT:   87 y.o. female with past medical history of paroxysmal atrial fibrillation, HTN, HLD, recurrent episodes of acute colonic ischemia, CKD 3B and anemia of chronic disease who was admitted on 07/14/2021 with generalized abdominal pain, nausea and diarrhea.  RD working remotely.  Pt admitted with recurrent colitis. CT scan reporting possible SBO. Pt has remained NPO since admission and is now without nutrition for 7 days. Pt s/p PICC line placement and new TPN on 12/3. Pt remains NPO. Plan is for NGT placement as pt with increased distension, nausea and abdominal pain today. Per chart, pt appears fairly weight stable at baseline. RD will obtain nutrition related exam and history at follow up.   Medications reviewed and include: insulin, azithromycin, ceftriaxone, metronidazole    Labs reviewed: K 3.9 wnl, BUN 44(H), Creat 1.56(H), P 3.3 wnl, Mg 2.0 wnl  Hgb 10.1(L), Hct 30.7(L) Cbgs- 161, 165, 180, 176 x 24 hrs  NUTRITION - FOCUSED PHYSICAL EXAM: Unable to perform at this time   Diet Order:   Diet Order             Diet NPO time specified Except for: Sips with Meds  Diet effective now                  EDUCATION NEEDS:   No education needs have been identified at this time  Skin:  Skin Assessment: Reviewed RN Assessment  Last BM:  12/4- type 7  Height:   Ht Readings from Last 1  Encounters:  07/13/21 5\' 7"  (1.702 m)    Weight:   Wt Readings from Last 1 Encounters:  07/13/21 68 kg    Ideal Body Weight:  61.36 kg  BMI:  Body mass index is 23.49 kg/m.  Estimated Nutritional Needs:   Kcal:  1500-1700kcal/day  Protein:  75-85g/day  Fluid:  1.5-1.7L/day  Koleen Distance MS, RD, LDN Please refer to [facility] for RD and/or RD on-call/weekend/after hours pager

## 2021-07-19 NOTE — Progress Notes (Signed)
Patient ID: Jamie Hodges, female   DOB: 1927-12-27, 87 y.o.   MRN: [id number]     [place] [facility] Day(s): 5.   Interval History: Patient seen and examined.  She had vomiting last night.  She has been feeling more distended.  Denies abdominal pain.  Endorses feeling nauseous.  Endorses passing gas as well.  Vital signs in last 24 hours: [min-max] current  Temp:  [97.7 F (36.5 C)-98.3 F (36.8 C)] 98.3 F (36.8 C) (12/04 0423) Pulse Rate:  [71-90] 90 (12/04 0423) Resp:  [18-20] 20 (12/04 0423) BP: (143-156)/(62-87) 149/63 (12/04 0423) SpO2:  [90 %-94 %] 94 % (12/04 0423)     Height: 5\' 7"  (170.2 cm) Weight: 68 kg BMI (Calculated): 23.49   Physical Exam:  Constitutional: alert, cooperative and no distress  Respiratory: breathing non-labored at rest  Cardiovascular: regular rate and sinus rhythm  Gastrointestinal: soft, non-tender, but distended  Labs:  CBC Latest Ref Rng & Units 07/19/2021 07/18/2021 07/17/2021  WBC 4.0 - 10.5 K/uL 7.8 8.3 13.7(H)  Hemoglobin 12.0 - 15.0 g/dL 10.1(L) 9.2(L) 9.9(L)  Hematocrit 36.0 - 46.0 % 30.7(L) 28.7(L) 30.0(L)  Platelets 150 - 400 K/uL 222 187 169   CMP Latest Ref Rng & Units 07/19/2021 07/18/2021 07/17/2021  Glucose 70 - 99 mg/dL 178(H) 94 104(H)  BUN 8 - 23 mg/dL 44(H) 56(H) 58(H)  Creatinine 0.44 - 1.00 mg/dL 1.56(H) 1.93(H) 2.37(H)  Sodium 135 - 145 mmol/L 137 137 136  Potassium 3.5 - 5.1 mmol/L 3.9 3.8 4.2  Chloride 98 - 111 mmol/L 106 107 107  CO2 22 - 32 mmol/L 22 20(L) 20(L)  Calcium 8.9 - 10.3 mg/dL 8.0(L) 8.0(L) 8.1(L)  Total Protein 6.5 - 8.1 g/dL - - -  Total Bilirubin 0.3 - 1.2 mg/dL - - -  Alkaline Phos 38 - 126 U/L - - -  AST 15 - 41 U/L - - -  ALT 0 - 44 U/L - - -    Imaging studies: Abdominal x-ray this morning showing persistent small bowel dilation.  Consistent with ileus versus bowel obstruction.  I personally evaluated the images.   Assessment/Plan:  87 y.o. female with ischemic enteritis, complicated  by pertinent comorbidities including A. fib, hypertension, chronic kidney disease stage IIIb, anemia of chronic disease.  Today patient feeling more distended than yesterday.  She had an episode of vomiting last night.  I put the order for the NGT to be placed.  I discussed with the patient and the family bedside about NGT placement for small intestinal decompression.  After NGT is placed she will be able to have ice chips for comfort and chewing gum.  I discussed with the family causes of small bowel dilation could be stricture of the segment of the small intestine with ischemia versus ileus.  We will start with conservative management with NGT decompression, IV hydration, patient started on TPN yesterday.  Encourage the patient to ambulate.  Physical therapy on board.  We will continue to follow closely.  Arnold Long, MD

## 2021-07-19 NOTE — Progress Notes (Signed)
Patient talked to son about the need for an NG tube. I spoke to Dr. Hal Hope about patient agreeing to NG tube placement.  Awaiting orders.  Christene Slates

## 2021-07-19 NOTE — Progress Notes (Signed)
Patient vomited bile contents this morning and had a small liquid stool.  Appears to feel a little better. Patient is also wheezing.  Currently sitting in the chair.  Awaiting orders for NG tube though.  Day shift nurse, Randa Ngo, notified of patient's current status.  Christene Slates  07/19/2021  7:18 AM

## 2021-07-19 NOTE — Progress Notes (Signed)
Patient is still not feeling well.  Still nauseous but has not vomited yet.  Patient given peppermint gum in hopes of settling her stomach.  Stomach is distended and firm.  Will continue to monitor.  Christene Slates

## 2021-07-20 ENCOUNTER — Inpatient Hospital Stay: Payer: Medicare Other

## 2021-07-20 DIAGNOSIS — K56609 Unspecified intestinal obstruction, unspecified as to partial versus complete obstruction: Secondary | ICD-10-CM

## 2021-07-20 DIAGNOSIS — Z978 Presence of other specified devices: Secondary | ICD-10-CM

## 2021-07-20 DIAGNOSIS — K559 Vascular disorder of intestine, unspecified: Secondary | ICD-10-CM | POA: Diagnosis not present

## 2021-07-20 LAB — COMPREHENSIVE METABOLIC PANEL
ALT: 8 U/L (ref 0–44)
AST: 13 U/L — ABNORMAL LOW (ref 15–41)
Albumin: 2.5 g/dL — ABNORMAL LOW (ref 3.5–5.0)
Alkaline Phosphatase: 43 U/L (ref 38–126)
Anion gap: 5 (ref 5–15)
BUN: 42 mg/dL — ABNORMAL HIGH (ref 8–23)
CO2: 24 mmol/L (ref 22–32)
Calcium: 7.8 mg/dL — ABNORMAL LOW (ref 8.9–10.3)
Chloride: 110 mmol/L (ref 98–111)
Creatinine, Ser: 1.35 mg/dL — ABNORMAL HIGH (ref 0.44–1.00)
GFR, Estimated: 37 mL/min — ABNORMAL LOW (ref >60)
Glucose, Bld: 164 mg/dL — ABNORMAL HIGH (ref 70–99)
Potassium: 3.6 mmol/L (ref 3.5–5.1)
Sodium: 139 mmol/L (ref 135–145)
Total Bilirubin: 0.4 mg/dL (ref 0.3–1.2)
Total Protein: 5.3 g/dL — ABNORMAL LOW (ref 6.5–8.1)

## 2021-07-20 LAB — MAGNESIUM: Magnesium: 2 mg/dL (ref 1.7–2.4)

## 2021-07-20 LAB — GLUCOSE, CAPILLARY
Glucose-Capillary: 126 mg/dL — ABNORMAL HIGH (ref 70–99)
Glucose-Capillary: 161 mg/dL — ABNORMAL HIGH (ref 70–99)
Glucose-Capillary: 163 mg/dL — ABNORMAL HIGH (ref 70–99)
Glucose-Capillary: 180 mg/dL — ABNORMAL HIGH (ref 70–99)

## 2021-07-20 LAB — PHOSPHORUS: Phosphorus: 2.8 mg/dL (ref 2.5–4.6)

## 2021-07-20 MED ORDER — MENTHOL 3 MG MT LOZG
1.0000 | LOZENGE | OROMUCOSAL | Status: DC | PRN
  Administered 2021-07-21: 3 mg via ORAL
  Filled 2021-07-20 (×2): qty 9

## 2021-07-20 MED ORDER — IPRATROPIUM-ALBUTEROL 0.5-2.5 (3) MG/3ML IN SOLN
3.0000 mL | Freq: Two times a day (BID) | RESPIRATORY_TRACT | Status: DC
Start: 2021-07-20 — End: 2021-07-22
  Administered 2021-07-20 – 2021-07-22 (×5): 3 mL via RESPIRATORY_TRACT
  Filled 2021-07-20 (×4): qty 3

## 2021-07-20 MED ORDER — FUROSEMIDE 10 MG/ML IJ SOLN
20.0000 mg | Freq: Every day | INTRAMUSCULAR | Status: DC
Start: 2021-07-20 — End: 2021-07-21
  Administered 2021-07-20 – 2021-07-21 (×2): 20 mg via INTRAVENOUS
  Filled 2021-07-20 (×2): qty 4

## 2021-07-20 MED ORDER — TRACE MINERALS CU-MN-SE-ZN 300-55-60-3000 MCG/ML IV SOLN: INTRAVENOUS | Status: DC

## 2021-07-20 MED ORDER — PHENOL 1.4 % MT LIQD
1.0000 | OROMUCOSAL | Status: DC | PRN
  Filled 2021-07-20: qty 177

## 2021-07-20 MED ORDER — APIXABAN 5 MG PO TABS
5.0000 mg | ORAL_TABLET | Freq: Two times a day (BID) | ORAL | Status: DC
  Administered 2021-07-20 – 2021-07-25 (×10): 5 mg via ORAL
  Filled 2021-07-20 (×10): qty 1

## 2021-07-20 MED ORDER — IPRATROPIUM-ALBUTEROL 0.5-2.5 (3) MG/3ML IN SOLN
3.0000 mL | Freq: Three times a day (TID) | RESPIRATORY_TRACT | Status: DC
Start: 2021-07-20 — End: 2021-07-20

## 2021-07-20 MED ORDER — TRAVASOL 10 % IV SOLN
INTRAVENOUS | Status: AC
  Filled 2021-07-20: qty 780

## 2021-07-20 NOTE — Progress Notes (Signed)
PT Cancellation Note  Patient Details Name: Jamie Hodges MRN: [id number] DOB: 1928/05/18   Cancelled Treatment:    Reason Eval/Treat Not Completed: Fatigue/lethargy limiting ability to participate. Upon entry to room, pt asleep in recliner. Does not awaken to voice, turning on lights, or light touch to UE and LE. Will re-attempt at later time/date as available.   Salem Caster. Fairly IV, PT, DPT Physical Therapist- [facility]  07/20/2021, 2:08 PM

## 2021-07-20 NOTE — Progress Notes (Signed)
Occupational Therapy Treatment Patient Details Name: Jamie Hodges MRN: [id number] DOB: 1928/05/05 Today's Date: 07/20/2021   History of present illness Pt is a 87 y/o F with PMH: Paroxysmal atrial fibrillation, HTN, colonic ischemia, CKD 3B, anemia of chronic disease who presented to the ED from [facility] with a complaint of generalized abdominal pain, nausea and diarrhea typical for her episodes of ischemic colitis.  Patient has refused surgery in the past and continues to decline surgical intervention.   OT comments  Upon entering the room, pt supine in bed and agreeable to OT intervention. Pt has family present in the room to provide encouragement throughout. Pt performing bed mobility without assistance to EOB. Pt sits and combs hair with set up A to obtain needed items. Pt declined use of RW this session but based on performance she would benefit from it at discharge for safety and support. Pt standing with min A and ambulating with SPC 40' but fatigues quickly and returns to sit in recliner chair. She is unable to perform standing oral care tasks at sink secondary to fatigue. HR increased to 120's with mobility tasks. OT recommends SNF secondary to fatigue and assistance needed for tasks this session. Pt continues to benefit from OT intervention.    Recommendations for follow up therapy are one component of a multi-disciplinary discharge planning process, led by the attending physician.  Recommendations may be updated based on patient status, additional functional criteria and insurance authorization.    Follow Up Recommendations  Skilled nursing-short term rehab (<3 hours/day)    Assistance Recommended at Discharge Frequent or constant Supervision/Assistance  Equipment Recommendations  Other (comment)       Precautions / Restrictions Precautions Precautions: Fall Restrictions Weight Bearing Restrictions: No       Mobility Bed Mobility Overal bed mobility: Needs Assistance Bed  Mobility: Supine to Sit     Supine to sit: Supervision     General bed mobility comments: increased time and cuing for hand placement    Transfers Overall transfer level: Needs assistance Equipment used: Straight cane Transfers: Sit to/from Stand Sit to Stand: Min assist           General transfer comment: increased time and effort to stand with min A from standard height bed     Balance Overall balance assessment: Needs assistance Sitting-balance support: Feet supported Sitting balance-Leahy Scale: Good     Standing balance support: Single extremity supported Standing balance-Leahy Scale: Poor                             ADL either performed or assessed with clinical judgement   ADL                                         General ADL Comments: Pt seated on EOB to comb hair at sink with set up A. Pt refusing to use RW this session and stands with SPC and min A. Pt ambulates with min A and SPC 40'. Plan to stand at sink for ADLs but pt declined once returning to room secondary to fatigue.    Extremity/Trunk Assessment Upper Extremity Assessment Upper Extremity Assessment: Generalized weakness   Lower Extremity Assessment Lower Extremity Assessment: Generalized weakness        Vision Patient Visual Report: No change from baseline  Cognition Arousal/Alertness: Awake/alert Behavior During Therapy: WFL for tasks assessed/performed Overall Cognitive Status: No family/caregiver present to determine baseline cognitive functioning                                 General Comments: Pt is oriented to self and place, but not situation or time, and while she states the correct location initially, she then seemingly forgets and states something about checking answering machine while pushing the call button. Unsure of cognitive baseline, but pt is noted to repeat herself a few times during session as well. She follows  most basic one to two step commands, difficulty with higher level cognitive tasks.                     Pertinent Vitals/ Pain       Pain Assessment: No/denies pain         Frequency  Min 2X/week        Progress Toward Goals  OT Goals(current goals can now be found in the care plan section)  Progress towards OT goals: Progressing toward goals  Acute Rehab OT Goals Patient Stated Goal: to go home OT Goal Formulation: With patient Time For Goal Achievement: 07/30/21 Potential to Achieve Goals: Good  Plan Discharge plan remains appropriate;Frequency remains appropriate       AM-PAC OT "6 Clicks" Daily Activity     Outcome Measure   Help from another person eating meals?: None Help from another person taking care of personal grooming?: A Little Help from another person toileting, which includes using toliet, bedpan, or urinal?: A Lot Help from another person bathing (including washing, rinsing, drying)?: A Lot Help from another person to put on and taking off regular upper body clothing?: A Little Help from another person to put on and taking off regular lower body clothing?: A Little 6 Click Score: 17    End of Session Equipment Utilized During Treatment: Other (comment) (SPC)  OT Visit Diagnosis: Unsteadiness on feet (R26.81);Muscle weakness (generalized) (M62.81)   Activity Tolerance Patient tolerated treatment well   Patient Left with call bell/phone within reach;in chair;with chair alarm set;with family/visitor present;with nursing/sitter in room   Nurse Communication Mobility status        Time: [id number] OT Time Calculation (min): 23 min  Charges: OT General Charges $OT Visit: 1 Visit OT Treatments $Self Care/Home Management : 8-22 mins $Therapeutic Activity: 8-22 mins  Darleen Crocker, MS, OTR/L , CBIS ascom [phone]  07/20/21, 12:48 PM

## 2021-07-20 NOTE — Progress Notes (Addendum)
[facility] SURGICAL PROGRESS NOTE (cpt [phone])  Hospital Day(s): 6.   Interval History: Patient seen and examined, no acute events or new complaints overnight. Patient reports she feels better compared to yesterday. She remains distended but no significant abdominal pain. No fever, chills, nausea, or emesis. Her wheezing has improved from yesterday. Labs this morning are still pending. NGT output in the last 24 hours documented at 800 ccs. She has had 6 bowel movements recorded in the last 24 hours. She continues on TPN at goal rate. She is on rocephin and flagyl; azithromycin added today for potential bronchitis.   Review of Systems:  Constitutional: denies fever, chills  HEENT: denies cough or congestion  Respiratory: denies any shortness of breath  Cardiovascular: denies chest pain or palpitations  Gastrointestinal: + distension, denies abdominal pain, N/V,  Genitourinary: denies burning with urination or urinary frequency  Vital signs in last 24 hours: [min-max] current  Temp:  [97.7 F (36.5 C)-98.2 F (36.8 C)] 98.2 F (36.8 C) (12/05 0500) Pulse Rate:  [81-99] 91 (12/05 0500) Resp:  [14-20] 20 (12/05 0500) BP: (136-168)/(53-69) 168/69 (12/05 0500) SpO2:  [92 %-94 %] 94 % (12/05 0500) Weight:  [71.8 kg] 71.8 kg (12/05 0312)     Height: 5\' 7"  (170.2 cm) Weight: 71.8 kg BMI (Calculated): 24.79   Intake/Output last 2 shifts:  12/04 0701 - 12/05 0700 In: 3598.7 [I.V.:3003.6; IV Piggyback:595] Out: 800 [Emesis/NG output:800]   Physical Exam:  Constitutional: alert, cooperative and no distress  HENT: normocephalic without obvious abnormality; NGT in place Eyes: PERRL, EOM's grossly intact and symmetric  Respiratory: breathing non-labored at rest; no audible wheezing Cardiovascular: regular rate and sinus rhythm  Gastrointestinal: Soft, she remains distended and tympanic, non-tender, no rebound/guarding Musculoskeletal: no edema or wounds, motor and sensation  grossly intact, NT    Labs:  CBC Latest Ref Rng & Units 07/19/2021 07/18/2021 07/17/2021  WBC 4.0 - 10.5 K/uL 7.8 8.3 13.7(H)  Hemoglobin 12.0 - 15.0 g/dL 10.1(L) 9.2(L) 9.9(L)  Hematocrit 36.0 - 46.0 % 30.7(L) 28.7(L) 30.0(L)  Platelets 150 - 400 K/uL 222 187 169   CMP Latest Ref Rng & Units 07/19/2021 07/18/2021 07/17/2021  Glucose 70 - 99 mg/dL 178(H) 94 104(H)  BUN 8 - 23 mg/dL 44(H) 56(H) 58(H)  Creatinine 0.44 - 1.00 mg/dL 1.56(H) 1.93(H) 2.37(H)  Sodium 135 - 145 mmol/L 137 137 136  Potassium 3.5 - 5.1 mmol/L 3.9 3.8 4.2  Chloride 98 - 111 mmol/L 106 107 107  CO2 22 - 32 mmol/L 22 20(L) 20(L)  Calcium 8.9 - 10.3 mg/dL 8.0(L) 8.0(L) 8.1(L)  Total Protein 6.5 - 8.1 g/dL - - -  Total Bilirubin 0.3 - 1.2 mg/dL - - -  Alkaline Phos 38 - 126 U/L - - -  AST 15 - 41 U/L - - -  ALT 0 - 44 U/L - - -    Imaging studies:   KUB (07/20/2021) personally reviewed showing small bowel dilation, relatively unchanged, she does have some air in the colon, NGT in place, no free air, and radiologist report reviewed below:  IMPRESSION: Nasogastric tube in place with decompression of the stomach. Continued demonstration of dilated small bowel. Some colonic air. Findings most consistent with partial small bowel obstruction.   Assessment/Plan: (ICD-10's: K53.89) 87 y.o. female now with ileus (vs SBO vs stricture) initially admitted with pneumatosis of small intestine and portal venous gas concerning for ischemic enteritis.   - Again, she is refusing any surgical interventions  - Continue NGT  for now; LIS; monitor and record output  - Continue TPN; at goal; monitor electrolytes - Continue IV Abx (Cefepime, Flagyl); azithromycin added by medicine overnight for potential bronchitis  - Monitor abdominal examination +/- serial KUBs  - Pending clinical condition, may need repeat CT later this week, potentially as early as Wednesday, to reassess intra-abdominal process - Pain control prn; antiemetics prn    - Okay to mobilize; can clamp NGT to do so              - Further management per primary service; we will follow   All of the above findings and recommendations were discussed with the patient, patient's family (daughter at bedside), and the medical team, and all of patient's and family's questions were answered to their expressed satisfaction.  -- Edison Simon, PA-C [facility] 07/20/2021, 7:40 AM [phone] M-F: 7am - 4pm

## 2021-07-20 NOTE — Progress Notes (Signed)
PHARMACY - TOTAL PARENTERAL NUTRITION CONSULT NOTE   Indication: Prolonged ileus  Patient Measurements: Height: 5\' 7"  (170.2 cm) Weight: 71.8 kg (158 lb 4.6 oz) IBW/kg (Calculated) : 61.6 TPN AdjBW (KG): 68 Body mass index is 24.79 kg/m.  Assessment:  87 y.o. female w/ PMH of  atrial fibrillation on Eliquis, CAD, hypertension, hyperlipidemia, ischemic colitis brought to the ED via EMS from [facility] with a chief complaint of generalized abdominal pain, nausea and diarrhea. TPN is being started since patient has been 5 to 7 days without adequate nutrition.  Glucose / Insulin: BG previous 24h: 161-180(required 6 units SSI) Electrolytes: stable, wnl Renal: SCr 2.44>1.56>1.35 Hepatic: LFTs, TG wnl Intake / Output; MIVF: Net +5L since admit  GI Imaging: 12/02 CT abd: Extensive colonic diverticulosis without evidence superimposed acute diverticulitis on noncontrast examination. GI Surgeries / Procedures: none recent  Central access: 07/18/21 TPN start date: 07/18/21   Nutritional Goals: Goal TPN rate is 65 mL/hr (provides  g of protein and 2271 kcals per day)  RD Assessment: pending Estimated Needs Total Energy Estimated Needs: 1500-1700kcal/day Total Protein Estimated Needs: 75-85g/day Total Fluid Estimated Needs: 1.5-1.7L/day Current Nutrition:  NPO  Plan:  Decrease TPN rate to 65 mL/hr at 1800 (total volume including overfill 1660 mL) per RD recommendations  Nutritional components: Amino acids (using 10% Travasol): 78 grams Dextrose: 234 grams Lipids (using 20% SMOFlipid): 51.4 grams kCal: 1621/24 hours Electrolytes in TPN (standard): Na 45mEq/L, K 57mEq/L, Ca 1mEq/L, Mg 23mEq/L, and Phos 44mmol/L. Cl:Ac 1:1  Add standard MVI and trace elements to TPN Add thiamine 100 mg to TPN x 3 days  continue Sensitive q6h SSI and adjust as needed  Monitor TPN labs on Mon/Thurs, daily until stable  Darnelle Bos, PharmD 07/20/2021,9:57 AM

## 2021-07-20 NOTE — Progress Notes (Signed)
PROGRESS NOTE  Jamie Hodges ZOX:[id number] DOB: 03-04-1928 DOA: 07/14/2021 PCP: [name], MD  Brief History   Jamie Hodges is a 87 y.o. female with medical history significant for Paroxysmal atrial fibrillation, HTN, colonic ischemia, CKD 3B, anemia of chronic disease who presented to the ED from [facility] with a complaint of generalized abdominal pain, nausea and diarrhea typical for her episodes of ischemic colitis.  Patient has refused surgery in the past and continues to decline surgical intervention.  The patient is being treated with IV Rocephin and Flagyl. General surgery is following. Her diet has been advanced to clear liquids, but the patient states that she has no appetite.  She is having flatus, but no BM.  Repeat CT today demonstrated likely ileus. Surgery prefers to treat conservatively at this point, but patient may require NGT to decompress.  The patient continues to display symptoms of SBO vs ileus as was demonstrated on Ct abdomen and pelvis yesterday. She remains NPO. As her last meal was 5-7 days ago, she will be started on TNP. PICC line has been placed.  This morning the patient remains distended and uncomfortably. Dr. Peyton Najjar has ordered NGT to be placed. CXR has been ordered to confirm placement.   This morning the cannister has about 700 cc of green liquid aspirated via NGT. The patient states that she is much more comfortably.  The patient's daughter in law is at bedside. She states that the patient's respiratory status is much improved.   Consultants  Palliative care General Surgery  Procedures  None  Antibiotics   Anti-infectives (From admission, onward)    Start     Dose/Rate Route Frequency Ordered Stop   07/20/21 1200  azithromycin (ZITHROMAX) 250 mg in dextrose 5 % 125 mL IVPB        250 mg 125 mL/hr over 60 Minutes Intravenous Every 24 hours 07/19/21 1307 07/24/21 1159   07/19/21 1400  azithromycin (ZITHROMAX) 500 mg in sodium chloride 0.9  % 250 mL IVPB        500 mg 250 mL/hr over 60 Minutes Intravenous  Once 07/19/21 1306 07/19/21 1616   07/16/21 1000  cefTRIAXone (ROCEPHIN) 2 g in sodium chloride 0.9 % 100 mL IVPB        2 g 200 mL/hr over 30 Minutes Intravenous Every 24 hours 07/15/21 0903     07/15/21 0200  ceFEPIme (MAXIPIME) 2 g in sodium chloride 0.9 % 100 mL IVPB  Status:  Discontinued        2 g 200 mL/hr over 30 Minutes Intravenous Every 24 hours 07/14/21 1033 07/15/21 0903   07/14/21 2000  metroNIDAZOLE (FLAGYL) IVPB 500 mg        500 mg 100 mL/hr over 60 Minutes Intravenous Every 12 hours 07/14/21 1033     07/14/21 0800  meropenem (MERREM) 500 mg in sodium chloride 0.9 % 100 mL IVPB  Status:  Discontinued        500 mg 200 mL/hr over 30 Minutes Intravenous Every 12 hours 07/14/21 0529 07/14/21 0957   07/14/21 0200  ceFEPIme (MAXIPIME) 2 g in sodium chloride 0.9 % 100 mL IVPB        2 g 200 mL/hr over 30 Minutes Intravenous  Once 07/14/21 0156 07/14/21 0306   07/14/21 0200  metroNIDAZOLE (FLAGYL) IVPB 500 mg        500 mg 100 mL/hr over 60 Minutes Intravenous  Once 07/14/21 0156 07/14/21 0334   07/14/21 0200  vancomycin (VANCOCIN) IVPB 1000  mg/200 mL premix        1,000 mg 200 mL/hr over 60 Minutes Intravenous  Once 07/14/21 0156 07/14/21 0443        Subjective  The patient is lying in bed. She states her abdomen is more comfortably, and her breathing is much better. She is complaining of bilateral lower extremity edema.  Objective   Vitals:  Vitals:   07/20/21 0747 07/20/21 1534  BP: (!) 180/69 (!) 149/60  Pulse: 95 77  Resp: 16 16  Temp: 98.8 F (37.1 C) 98.9 F (37.2 C)  SpO2: 94% 92%    Exam:  Constitutional:  The patient is awake, alert, and oriented x 3.  No acute distress. Respiratory:  Mildly increased work of breathing. Positive for wheezes and rhonchi throughout with coarse rales No tactile fremitus Cardiovascular:  Regular rate and rhythm No murmurs, ectopy, or gallups. No  lateral PMI. No thrills. Abdomen:  Abdomen is soft, distended, but non-tender No hernias, masses, or organomegaly Hypo-active bowel sounds.  Musculoskeletal:  No cyanosis, clubbing, or edema Skin:  No rashes, lesions, ulcers palpation of skin: no induration or nodules Neurologic:  CN 2-12 intact Sensation all 4 extremities intact Psychiatric:  Mental status Mood, affect appropriate Orientation to person, place, time  judgment and insight appear intact  I have personally reviewed the following:   Today's Data  Vitals  Lab Data  BMP  Imaging  CT abdomen and pelvis CXR  Cardiology Data  EKG  Scheduled Meds:  amiodarone  200 mg Oral Daily   apixaban  5 mg Oral BID   Chlorhexidine Gluconate Cloth  6 each Topical Daily   diltiazem  360 mg Oral Daily   insulin aspart  0-9 Units Subcutaneous Q6H   ipratropium-albuterol  3 mL Nebulization BID   metoprolol tartrate  50 mg Oral BID   sodium chloride flush  10-40 mL Intracatheter Q12H   Continuous Infusions:  sodium chloride 10 mL/hr at 07/19/21 2130   azithromycin 250 mg (07/20/21 1203)   cefTRIAXone (ROCEPHIN)  IV 2 g (07/20/21 1112)   metronidazole 500 mg (07/20/21 1008)   TPN ADULT (ION) 84 mL/hr at 07/19/21 1829   TPN ADULT (ION)      Principal Problem:   Ischemic enteritis (HCC) Active Problems:   AF (paroxysmal atrial fibrillation) (HCC)   Atrial fibrillation (HCC)   CAD (coronary artery disease)   LOS: 6 days   A & P  Acute ischemic enteritis --pt continues to refuse surgical intervention --started on broad-spectrum abx Plan: --cont abx as ceftriaxone and flagyl, per pharm rec --NPO except sips of water and ice chips -IV pain meds and IV antiemetics By CT this appears resolved. --palliative consult today  SBO vs Ileus: CT today indicates possible development of ileus. Pt is again NPO. Monitor. NGT is in good placement for decompression with suction. Abdomen remains somewhat distended, although much  better.   Pulmonary/peripheral edema: Improved with lasix. I believe her wheezes were mostly due to volume overload. Will continue lasix 20 mg daily IV due to the development of peripheral edema. Continue albuterol for wheezes on a prn basis. Monitor electrolytes.  Acute Bronchitis:  No evidence for bronchitis. Will discontinue azithromycin.     AF (paroxysmal atrial fibrillation) ([place]) --resume home amiodarone, Cardizem and Lopressor --cont home Eliquis (with renal adjustment) -Heart rate is controlled.   Hypertension --resume home amiodarone, Cardizem and Lopressor. The patient is a little hypertensive today. Will add IV Hydralazine prn due to possibility that patient  may not be absorbing oral antihypertensives.   AKI --Cr 1.33 on presentation.  Creatinine is improved from 2.44 to 1.99 on 07/18/2021 and 1.35 today. Monitor creatinine, electrolytes and volume status.   Lactic acidosis  Resolved. Due to bowel ischemia.  I have seen and examined this patient myself. I have spent 34 minutes in her evaluation and care.    DVT prophylaxis: [id number] Code Status: DNR  Family Communication:  DIL at bedside. Level of care: Med-Surg Dispo:   The patient is from: [facility] Anticipated d/c is to: to be determined Anticipated d/c date is: undetermined Patient currently is not medically ready to d/c due to: bowel ischemia, NPO, on IV abx  Lisbeth Puller, DO [facility] Direct contact: see [email or web address]  7PM-7AM contact night coverage as above 07/20/2021, 4:47 PM  LOS: 2 days

## 2021-07-20 NOTE — Progress Notes (Addendum)
After reviewing the patient's chart, epic notes, labs, and imaging, I assessed the patient at bedside.  She is somnolent and did not respond to verbal stimuli.  She is in no apparent distress.  Breathing is unlabored and WNL.  She is out of the bed and sitting up in her recliner.  No family is at bedside.  From chart review, patient agreed to NG tube placement.  NG is to wall suction with bilious output.  Patient also agreed to begin TPN and PICC line to be placed.   Palliative medicine will continue to follow patient throughout her hospitalization.  No palliative medicine needs identified for today.   [place] Ilsa Iha, FNP-BC Palliative Medicine Team Team Phone # [phone]   NO CHARGE

## 2021-07-21 DIAGNOSIS — R1084 Generalized abdominal pain: Secondary | ICD-10-CM | POA: Diagnosis not present

## 2021-07-21 DIAGNOSIS — K6389 Other specified diseases of intestine: Secondary | ICD-10-CM | POA: Diagnosis not present

## 2021-07-21 DIAGNOSIS — K56609 Unspecified intestinal obstruction, unspecified as to partial versus complete obstruction: Secondary | ICD-10-CM | POA: Diagnosis not present

## 2021-07-21 DIAGNOSIS — K559 Vascular disorder of intestine, unspecified: Secondary | ICD-10-CM | POA: Diagnosis not present

## 2021-07-21 LAB — CBC WITH DIFFERENTIAL/PLATELET
Abs Immature Granulocytes: 0.2 10*3/uL — ABNORMAL HIGH (ref 0.00–0.07)
Basophils Absolute: 0 10*3/uL (ref 0.0–0.1)
Basophils Relative: 0 %
Eosinophils Absolute: 0.1 10*3/uL (ref 0.0–0.5)
Eosinophils Relative: 1 %
HCT: 28.3 % — ABNORMAL LOW (ref 36.0–46.0)
Hemoglobin: 9 g/dL — ABNORMAL LOW (ref 12.0–15.0)
Immature Granulocytes: 2 %
Lymphocytes Relative: 7 %
Lymphs Abs: 0.7 10*3/uL (ref 0.7–4.0)
MCH: 31.5 pg (ref 26.0–34.0)
MCHC: 31.8 g/dL (ref 30.0–36.0)
MCV: 99 fL (ref 80.0–100.0)
Monocytes Absolute: 0.6 10*3/uL (ref 0.1–1.0)
Monocytes Relative: 6 %
Neutro Abs: 8.1 10*3/uL — ABNORMAL HIGH (ref 1.7–7.7)
Neutrophils Relative %: 84 %
Platelets: 212 10*3/uL (ref 150–400)
RBC: 2.86 MIL/uL — ABNORMAL LOW (ref 3.87–5.11)
RDW: 14.9 % (ref 11.5–15.5)
WBC: 9.8 10*3/uL (ref 4.0–10.5)
nRBC: 0 % (ref 0.0–0.2)

## 2021-07-21 LAB — BASIC METABOLIC PANEL
Anion gap: 6 (ref 5–15)
BUN: 37 mg/dL — ABNORMAL HIGH (ref 8–23)
CO2: 26 mmol/L (ref 22–32)
Calcium: 7.8 mg/dL — ABNORMAL LOW (ref 8.9–10.3)
Chloride: 109 mmol/L (ref 98–111)
Creatinine, Ser: 1.21 mg/dL — ABNORMAL HIGH (ref 0.44–1.00)
GFR, Estimated: 42 mL/min — ABNORMAL LOW (ref >60)
Glucose, Bld: 125 mg/dL — ABNORMAL HIGH (ref 70–99)
Potassium: 4.1 mmol/L (ref 3.5–5.1)
Sodium: 141 mmol/L (ref 135–145)

## 2021-07-21 LAB — GLUCOSE, CAPILLARY
Glucose-Capillary: 111 mg/dL — ABNORMAL HIGH (ref 70–99)
Glucose-Capillary: 120 mg/dL — ABNORMAL HIGH (ref 70–99)
Glucose-Capillary: 130 mg/dL — ABNORMAL HIGH (ref 70–99)
Glucose-Capillary: 131 mg/dL — ABNORMAL HIGH (ref 70–99)
Glucose-Capillary: 153 mg/dL — ABNORMAL HIGH (ref 70–99)

## 2021-07-21 LAB — MAGNESIUM: Magnesium: 1.8 mg/dL (ref 1.7–2.4)

## 2021-07-21 LAB — PHOSPHORUS: Phosphorus: 2.9 mg/dL (ref 2.5–4.6)

## 2021-07-21 MED ORDER — TRAVASOL 10 % IV SOLN
INTRAVENOUS | Status: AC
  Filled 2021-07-21: qty 780

## 2021-07-21 MED ORDER — MELATONIN 5 MG PO TABS
2.5000 mg | ORAL_TABLET | Freq: Once | ORAL | Status: DC
  Filled 2021-07-21: qty 1

## 2021-07-21 NOTE — Progress Notes (Signed)
Agree with Marcie Bal, RN am assessment. NG tube d/ced, diet advanced, patient tolerating well.  No significant changes throughout the day. Patient oob to chair, TPN infusing.  Will report off to oncoming nurse.

## 2021-07-21 NOTE — Progress Notes (Signed)
Physical Therapy Treatment Patient Details Name: Jamie Hodges MRN: [id number] DOB: [birth year] Today's Date: 07/21/2021   History of Present Illness Pt is a 87 y/o F with PMH: Paroxysmal atrial fibrillation, HTN, colonic ischemia, CKD 3B, anemia of chronic disease who presented to the ED from [facility] with a complaint of generalized abdominal pain, nausea and diarrhea typical for her episodes of ischemic colitis.  Patient has refused surgery in the past and continues to decline surgical intervention.    PT Comments    Pt received supine in bed agreeable to PT with daughter present in room. NGT clamped upon entry for mobility. Pt is supervision for all bed mobility and minguard for STS transfers and ambulation. Able to progress amb' to 8' with SPC and only x2-3 bouts to reach for wall or counter for steadying herself. Step to pattern noted with poor sequencing of SPC but pt endorses this is how she uses her SPC at baseline with community tasks and does intermittently use LUE on furniture surfaces at baseline. Pt returned to room declining attempts of ambulating with RW at this time. Reports needing to urinate so pt transferred from recliner to [facility]. Supervision provided for standing perihygiene and pulling briefs up then returned to bed. Pt progressing in safe mobility with improved stability with SPC although with poor use pt had no LOB or unsteadiness. Will benefit from [facility] PT to further progress safe mobility with LRAD.     Recommendations for follow up therapy are one component of a multi-disciplinary discharge planning process, led by the attending physician.  Recommendations may be updated based on patient status, additional functional criteria and insurance authorization.  Follow Up Recommendations  Home health PT (refusing SNF placement)     Assistance Recommended at Discharge Frequent or constant Supervision/Assistance  Equipment Recommendations  None recommended by PT    Recommendations  for Other Services       Precautions / Restrictions Precautions Precautions: Fall Restrictions Weight Bearing Restrictions: No     Mobility  Bed Mobility Overal bed mobility: Needs Assistance Bed Mobility: Supine to Sit;Sit to Supine     Supine to sit: Supervision Sit to supine: Supervision   General bed mobility comments: Able to scoot up in bed with UE's on hand rails Patient Response: Cooperative  Transfers Overall transfer level: Needs assistance Equipment used: Straight cane Transfers: Sit to/from Stand Sit to Stand: Min guard           General transfer comment: Bouts of momentum and R hand on SPC and L hand on bed rail to stand. Bed height standard    Ambulation/Gait Ambulation/Gait assistance: Min guard Gait Distance (Feet): 80 Feet Assistive device: Straight cane Gait Pattern/deviations: Step-to pattern;Narrow base of support;Shuffle;Decreased step length - right;Decreased step length - left Gait velocity: Able to amb with noticeable increased speed when asked to without LOB.     General Gait Details: MOre steady today with SPC. X2-3 bouts of pt reaching with LUE to touch wall or counter top. Poor sequencing of SPC in RUE but pt reports when using cane this is her base line use of it.   Stairs             Wheelchair Mobility    Modified Rankin (Stroke Patients Only)       Balance Overall balance assessment: Needs assistance Sitting-balance support: Feet supported Sitting balance-Leahy Scale: Good     Standing balance support: Single extremity supported Standing balance-Leahy Scale: Good Standing balance comment: Tolerates static standing to  wipe herself and pull on underwear at [facility].                            Cognition Arousal/Alertness: Awake/alert Behavior During Therapy: WFL for tasks assessed/performed Overall Cognitive Status: Within Functional Limits for tasks assessed                                           Exercises      General Comments        Pertinent Vitals/Pain Pain Assessment: No/denies pain    Home Living                          Prior Function            PT Goals (current goals can now be found in the care plan section) Acute Rehab PT Goals Patient Stated Goal: to return to independent living PT Goal Formulation: With patient Time For Goal Achievement: 07/31/21 Potential to Achieve Goals: Fair Progress towards PT goals: Progressing toward goals    Frequency    Min 2X/week      PT Plan Current plan remains appropriate    Co-evaluation              AM-PAC PT "6 Clicks" Mobility   Outcome Measure  Help needed turning from your back to your side while in a flat bed without using bedrails?: None Help needed moving from lying on your back to sitting on the side of a flat bed without using bedrails?: None Help needed moving to and from a bed to a chair (including a wheelchair)?: A Little Help needed standing up from a chair using your arms (e.g., wheelchair or bedside chair)?: A Little Help needed to walk in hospital room?: A Little Help needed climbing 3-5 steps with a railing? : A Lot 6 Click Score: 19    End of Session Equipment Utilized During Treatment: Gait belt Activity Tolerance: Patient tolerated treatment well Patient left: in bed;with call bell/phone within reach;with bed alarm set Nurse Communication: Mobility status PT Visit Diagnosis: Unsteadiness on feet (R26.81);Difficulty in walking, not elsewhere classified (R26.2);Muscle weakness (generalized) (M62.81)     Time: 8110-3159 PT Time Calculation (min) (ACUTE ONLY): 23 min  Charges:  $Gait Training: 23-37 mins                     Salem Caster. Fairly IV, PT, DPT Physical Therapist- [facility]  07/21/2021, 12:05 PM

## 2021-07-21 NOTE — Progress Notes (Addendum)
[facility] SURGICAL PROGRESS NOTE (cpt [phone])  Hospital Day(s): 7.   Interval History: Patient seen and examined, no acute events or new complaints overnight. Patient reports she is feeling better, distension improving, no significant abdominal pain. No fever, chills, nausea, emesis. Again, labs this morning have yet to be drawn. Upon review of labs from yesterday renal function remains near her baseline and she is without electrolyte derangements. NGT output in the last 24 hours documented at 650 ccs, but this slowed significantly overnight. She has had 2 bowel movements recorded in the last 24 hours. She continues on TPN at goal rate. She is on rocephin and flagyl; azithromycin added by medicine for potential bronchitis.   Review of Systems:  Constitutional: denies fever, chills  HEENT: denies cough or congestion  Respiratory: denies any shortness of breath  Cardiovascular: denies chest pain or palpitations  Gastrointestinal: + distension (improved), denies abdominal pain, N/V,  Genitourinary: denies burning with urination or urinary frequency  Vital signs in last 24 hours: [min-max] current  Temp:  [98.1 F (36.7 C)-98.9 F (37.2 C)] 98.4 F (36.9 C) (12/06 0432) Pulse Rate:  [77-95] 90 (12/06 0432) Resp:  [16-20] 20 (12/06 0432) BP: (149-180)/(60-69) 149/60 (12/06 0432) SpO2:  [92 %-97 %] 94 % (12/06 0432) Weight:  [76.8 kg-77.9 kg] 77.9 kg (12/06 0500)     Height: 5\' 7"  (170.2 cm) Weight: 77.9 kg BMI (Calculated): 26.89   Intake/Output last 2 shifts:  12/05 0701 - 12/06 0700 In: 2774 [P.O.:118; I.V.:1430; IV Piggyback:225] Out: 750 [Urine:100; Emesis/NG output:650]   Physical Exam:  Constitutional: alert, cooperative and no distress  HENT: normocephalic without obvious abnormality; NGT in place Eyes: PERRL, EOM's grossly intact and symmetric  Respiratory: breathing non-labored at rest; no audible wheezing Cardiovascular: regular rate and sinus rhythm   Gastrointestinal: Soft distension certainly improved, she remains non-tender, no rebound/guarding. She remains certainly without peritonitis  Musculoskeletal: no edema or wounds, motor and sensation grossly intact, NT    Labs:  CBC Latest Ref Rng & Units 07/19/2021 07/18/2021 07/17/2021  WBC 4.0 - 10.5 K/uL 7.8 8.3 13.7(H)  Hemoglobin 12.0 - 15.0 g/dL 10.1(L) 9.2(L) 9.9(L)  Hematocrit 36.0 - 46.0 % 30.7(L) 28.7(L) 30.0(L)  Platelets 150 - 400 K/uL 222 187 169   CMP Latest Ref Rng & Units 07/20/2021 07/19/2021 07/18/2021  Glucose 70 - 99 mg/dL 164(H) 178(H) 94  BUN 8 - 23 mg/dL 42(H) 44(H) 56(H)  Creatinine 0.44 - 1.00 mg/dL 1.35(H) 1.56(H) 1.93(H)  Sodium 135 - 145 mmol/L 139 137 137  Potassium 3.5 - 5.1 mmol/L 3.6 3.9 3.8  Chloride 98 - 111 mmol/L 110 106 107  CO2 22 - 32 mmol/L 24 22 20(L)  Calcium 8.9 - 10.3 mg/dL 7.8(L) 8.0(L) 8.0(L)  Total Protein 6.5 - 8.1 g/dL 5.3(L) - -  Total Bilirubin 0.3 - 1.2 mg/dL 0.4 - -  Alkaline Phos 38 - 126 U/L 43 - -  AST 15 - 41 U/L 13(L) - -  ALT 0 - 44 U/L 8 - -    Imaging studies: No new pertinent imaging studies   Assessment/Plan: (ICD-10's: K66.89) 87 y.o. female now with ileus (vs SBO vs stricture) initially admitted with pneumatosis of small intestine and portal venous gas concerning for ischemic enteritis.   We will clamp NGT this morning. After 4 hours, we will check residuals, If residuals are less than 150 ccs, I think we can remove NGT later today.    - Again, she is refusing any surgical interventions  -  Continue TPN; at goal; monitor electrolytes - Continue IV Abx (Cefepime, Flagyl); azithromycin added by medicine overnight for potential bronchitis  - Monitor abdominal examination +/- serial KUBs  - Pending clinical condition, may need repeat CT later this week, potentially as early as Wednesday, to reassess intra-abdominal process - Pain control prn; antiemetics prn   - Okay to mobilize; can clamp NGT to do so              -  Further management per primary service; we will follow   All of the above findings and recommendations were discussed with the patient, patient's family, and the medical team, and all of patient's and family's questions were answered to their expressed satisfaction.  -- Edison Simon, PA-C [facility] 07/21/2021, 7:31 AM [phone] M-F: 7am - 4pm

## 2021-07-21 NOTE — Progress Notes (Signed)
PROGRESS NOTE  Jamie Hodges WJX:[id number] DOB: [birth year] DOA: 07/14/2021 PCP: [name], MD  Brief History   Jamie Hodges is a 87 y.o. female with medical history significant for Paroxysmal atrial fibrillation, HTN, colonic ischemia, CKD 3B, anemia of chronic disease who presented to the ED from [facility] with a complaint of generalized abdominal pain, nausea and diarrhea typical for her episodes of ischemic colitis.  Patient has refused surgery in the past and continues to decline surgical intervention.  The patient is being treated with IV Rocephin and Flagyl. General surgery is following. Her diet has been advanced to clear liquids, but the patient states that she has no appetite.  She is having flatus, but no BM.  Repeat CT today demonstrated likely ileus. Surgery prefers to treat conservatively at this point, but patient may require NGT to decompress.  The patient continues to display symptoms of SBO vs ileus as was demonstrated on Ct abdomen and pelvis yesterday. She remains NPO. As her last meal was 5-7 days ago, she will be started on TNP. PICC line has been placed.  This morning the patient remains distended and uncomfortably. Dr. Peyton Najjar has ordered NGT to be placed. CXR has been ordered to confirm placement.   This morning the cannister has about 700 cc of green liquid aspirated via NGT. The patient states that she is much more comfortably.  The patient's daughter in law is at bedside. The patient's NGT has been clamped. No further respiratory issues, but patient and daughter-in-law feel that the bid nebs help the patient.  Consultants  Palliative care General Surgery  Procedures  None  Antibiotics   Anti-infectives (From admission, onward)    Start     Dose/Rate Route Frequency Ordered Stop   07/20/21 1200  azithromycin (ZITHROMAX) 250 mg in dextrose 5 % 125 mL IVPB  Status:  Discontinued        250 mg 125 mL/hr over 60 Minutes Intravenous Every 24 hours  07/19/21 1307 07/20/21 1645   07/19/21 1400  azithromycin (ZITHROMAX) 500 mg in sodium chloride 0.9 % 250 mL IVPB        500 mg 250 mL/hr over 60 Minutes Intravenous  Once 07/19/21 1306 07/19/21 1616   07/16/21 1000  cefTRIAXone (ROCEPHIN) 2 g in sodium chloride 0.9 % 100 mL IVPB        2 g 200 mL/hr over 30 Minutes Intravenous Every 24 hours 07/15/21 0903     07/15/21 0200  ceFEPIme (MAXIPIME) 2 g in sodium chloride 0.9 % 100 mL IVPB  Status:  Discontinued        2 g 200 mL/hr over 30 Minutes Intravenous Every 24 hours 07/14/21 1033 07/15/21 0903   07/14/21 2000  metroNIDAZOLE (FLAGYL) IVPB 500 mg        500 mg 100 mL/hr over 60 Minutes Intravenous Every 12 hours 07/14/21 1033     07/14/21 0800  meropenem (MERREM) 500 mg in sodium chloride 0.9 % 100 mL IVPB  Status:  Discontinued        500 mg 200 mL/hr over 30 Minutes Intravenous Every 12 hours 07/14/21 0529 07/14/21 0957   07/14/21 0200  ceFEPIme (MAXIPIME) 2 g in sodium chloride 0.9 % 100 mL IVPB        2 g 200 mL/hr over 30 Minutes Intravenous  Once 07/14/21 0156 07/14/21 0306   07/14/21 0200  metroNIDAZOLE (FLAGYL) IVPB 500 mg        500 mg 100 mL/hr over 60 Minutes Intravenous  Once 07/14/21 0156 07/14/21 0334   07/14/21 0200  vancomycin (VANCOCIN) IVPB 1000 mg/200 mL premix        1,000 mg 200 mL/hr over 60 Minutes Intravenous  Once 07/14/21 0156 07/14/21 0443        Subjective  The patient is lying in bed. She is happy to have the tube clamped, but states that she will be happier when it is out. No new complaints.  Objective   Vitals:  Vitals:   07/21/21 0737 07/21/21 1626  BP: (!) 145/64 (!) 155/59  Pulse: 97 93  Resp: 17 17  Temp: 98.4 F (36.9 C) 98.6 F (37 C)  SpO2: 94% 94%    Exam:  Constitutional:  The patient is awake, alert, and oriented x 3.  No acute distress. Respiratory:  No increased work of breathing. No wheezes, rales, or rhonchi. No tactile fremitus Cardiovascular:  Regular rate and  rhythm No murmurs, ectopy, or gallups. No lateral PMI. No thrills. Abdomen:  Abdomen is soft, less distended, but non-tender No hernias, masses, or organomegaly Hypoactive bowel sounds.  Musculoskeletal:  No cyanosis, clubbing, or edema Skin:  No rashes, lesions, ulcers palpation of skin: no induration or nodules Neurologic:  CN 2-12 intact Sensation all 4 extremities intact Psychiatric:  Mental status Mood, affect appropriate Orientation to person, place, time  judgment and insight appear intact  I have personally reviewed the following:   Today's Data  Vitals  Lab Data  BMP  Imaging  CT abdomen and pelvis CXR  Cardiology Data  EKG  Scheduled Meds:  amiodarone  200 mg Oral Daily   apixaban  5 mg Oral BID   Chlorhexidine Gluconate Cloth  6 each Topical Daily   diltiazem  360 mg Oral Daily   furosemide  20 mg Intravenous Daily   insulin aspart  0-9 Units Subcutaneous Q6H   ipratropium-albuterol  3 mL Nebulization BID   metoprolol tartrate  50 mg Oral BID   sodium chloride flush  10-40 mL Intracatheter Q12H   Continuous Infusions:  sodium chloride 10 mL/hr at 07/19/21 2130   cefTRIAXone (ROCEPHIN)  IV 2 g (07/21/21 1000)   metronidazole 500 mg (07/21/21 1129)   TPN ADULT (ION) 65 mL/hr at 07/20/21 1955   TPN ADULT (ION)      Principal Problem:   Ischemic enteritis ([place]) Active Problems:   AF (paroxysmal atrial fibrillation) (HCC)   Atrial fibrillation (HCC)   CAD (coronary artery disease)   LOS: 7 days   A & P  Acute ischemic enteritis --pt continues to refuse surgical intervention --started on broad-spectrum abx Plan: --cont abx as ceftriaxone and flagyl, per pharm rec --NPO except sips of water and ice chips -IV pain meds and IV antiemetics By CT this appears resolved. --palliative consult today  SBO vs Ileus: CT today indicates possible development of ileus. Pt is again NPO. Monitor. NGT is in good placement for decompression. It has now  been clamped. Abdomen remains somewhat distended, but softer.   Pulmonary/peripheral edema: Improved with lasix. Lower extremity edema is resolved. Will stop lasix. Continue neb treatments as the patient feels that they are helpful.  Acute Bronchitis:  No evidence for bronchitis. Will discontinue azithromycin.     AF (paroxysmal atrial fibrillation) ([place]) --resume home amiodarone, Cardizem and Lopressor --cont home Eliquis (with renal adjustment) -Heart rate is controlled.   Hypertension --resume home amiodarone, Cardizem and Lopressor. The patient is a little hypertensive today. Will add IV Hydralazine prn due to possibility that patient  may not be absorbing oral antihypertensives.   AKI --Cr 1.33 on presentation.  Creatinine is improved from 2.44 to 1.99 on 07/18/2021 and 1.21 today. Monitor creatinine, electrolytes and volume status.   Lactic acidosis  Resolved. Due to bowel ischemia.  I have seen and examined this patient myself. I have spent 32 minutes in her evaluation and care.    DVT prophylaxis: [id number] Code Status: DNR  Family Communication:  DIL at bedside. Level of care: Med-Surg Dispo:   The patient is from: [facility] ILF Anticipated d/c is to: to be determined Anticipated d/c date is: undetermined Patient currently is not medically ready to d/c due to: bowel ischemia, NPO, on IV abx  Seniya Stoffers, DO [facility] Direct contact: see [email or web address]  7PM-7AM contact night coverage as above 07/21/2021, 4:38 PM  LOS: 2 days

## 2021-07-21 NOTE — Progress Notes (Signed)
PT Cancellation Note  Patient Details Name: Jamie Hodges MRN: [id number] DOB: 07-29-1928   Cancelled Treatment:    Reason Eval/Treat Not Completed: Patient declined, no reason specified. Pt received in bed. Reporting she just got back into bed declining to participate. Reports she is willing to participate. Will re-attempt as able.   Sal[place] Caster. Fairly IV, PT, DPT Physical Therapist- [facility]  07/21/2021, 9:51 AM

## 2021-07-21 NOTE — Progress Notes (Signed)
Palliative Care Progress Note, Assessment & Plan   Patient Name: Jamie Hodges       Date: 07/21/2021 DOB: 1927/08/19  Age: 87 y.o. MRN#: [id number] Attending Physician: Karie Kirks, DO Primary Care Physician: [name], MD Admit Date: 07/14/2021  Reason for Consultation/Follow-up: Establishing goals of care  Subjective: Patient is resting comfortably in bed with NG tube clamped in right nare.  She has no acute complaints of pain or abdominal discomfort.  Daughter-in-law Pamala Hurry is at bedside.  HPI: Patient is a 87 year old female with a medical history significant for A. fib, HTN, CKD, anemia of chronic disease, and colonic ischemia who was admitted from [facility] with complaints of abdominal pain, nausea, and diarrhea.  Patient has refused surgery and continues to decline surgical intervention to date.  During hospitalization patient became increasingly distended and uncomfortable.  NG tube was placed.  Patient endorses feeling much better with decreased pressure and pain in her abdomen.  NG tube is planned for trial clamp today.  Palliative medicine was consulted to discuss goals of care.  Summary of counseling/coordination of care: I have reviewed medical records including epic notes, labs, and imaging, and I assessed the patient at bedside and met with both the patient and daughter-in-law to discuss disposition and symptom management.  Patient verbalizes plan is to return to [facility] but she is unclear on which section she will go to.  I outlined that physical therapy's recommendations will help guide which level of care she will need at discharge.  Patient has complaints of skin and anal irritation due to excessive BMs.  Reviewed options once discharged and settled at home for hydrocortisone  1% ointment, suppositories, and use of probiotics in order to help manage side effects of excessive BMs.  Palliative outpatient services introduced and described in detail.  Dictated patient and daughter-in-law that palliative medicine specializes in symptom management as well as advance care planning and goals of care discussions.  Both patient and daughter-in-law are interested in having palliative follow her in the outpatient setting.  I reiterated the points of continued conversation with the family and the medical team in regards to overall plan of care and treatment options to ensure that decisions are within the context of the patient's values and goals of care.   Code Status: DNR  Prognosis: Unable to determine  Discharge Planning: [place] for rehab with Palliative care service follow-up  Recommendations/Plan: DNR Outpatient palliative services referral placed Symptom management for anal irritation  Care plan was discussed with patient and daughter in law  Physical Exam Vitals and nursing note reviewed.  Constitutional:      General: She is not in acute distress.    Appearance: She is well-developed. She is not ill-appearing.  HENT:     Head: Normocephalic and atraumatic.     Comments: NGT to right nare    Mouth/Throat:     Mouth: Mucous membranes are moist.  Pulmonary:     Effort: Pulmonary effort is normal.  Abdominal:     Palpations: Abdomen is soft.     Tenderness: There is abdominal tenderness in the right upper quadrant.  Skin:    General: Skin is warm and dry.  Neurological:     Mental Status: She is alert and oriented to person, place, and time.  Psychiatric:        Mood and Affect: Mood normal. Mood is not anxious.        Behavior: Behavior normal.            Flowsheet Rows    Flowsheet Row Most Recent Value  Intake Tab   Referral Department Hospitalist  Unit at Time of Referral Med/Surg Unit  Palliative Care Primary Diagnosis  Sepsis/Infectious Disease  Date Notified 07/14/21  Palliative Care Type New Palliative care  Reason for referral Clarify Goals of Care  Date of Admission 07/14/21  Date first seen by Palliative Care 07/15/21  # of days Palliative referral response time 1 Day(s)  # of days IP prior to Palliative referral 0  Clinical Assessment   Psychosocial & Spiritual Assessment   Palliative Care Outcomes         Total Time 15 minutes  Greater than 50%  of this time was spent counseling and coordinating care related to the above assessment and plan.  Thank you for allowing the Palliative Medicine Team to assist in the care of this patient.  [place] Ilsa Iha, FNP-BC Palliative Medicine Team Team Phone # [phone]

## 2021-07-21 NOTE — Progress Notes (Signed)
PHARMACY - TOTAL PARENTERAL NUTRITION CONSULT NOTE   Indication: Prolonged ileus  Patient Measurements: Height: 5\' 7"  (170.2 cm) Weight: 77.9 kg (171 lb 11.8 oz) IBW/kg (Calculated) : 61.6 TPN AdjBW (KG): 68 Body mass index is 26.9 kg/m.  Assessment:  87 y.o. female w/ PMH of  atrial fibrillation on Eliquis, CAD, hypertension, hyperlipidemia, ischemic colitis brought to the ED via EMS from [facility] with a chief complaint of generalized abdominal pain, nausea and diarrhea. TPN is being started since patient has been 5 to 7 days without adequate nutrition.  Glucose / Insulin: BG previous 24h: 125-153 (required 6 units SSI) Electrolytes: stable, wnl Renal: SCr 2.44>1.56>1.35>1.21 Hepatic: LFTs, TG wnl Intake / Output; MIVF: Net +6L since admit  GI Imaging: 12/02 CT abd: Extensive colonic diverticulosis without evidence superimposed acute diverticulitis on noncontrast examination. GI Surgeries / Procedures: none recent  Central access: 07/18/21 TPN start date: 07/18/21   Nutritional Goals: Goal TPN rate is 65 mL/hr (provides 78 g of protein and 1,621 kcals per day)  RD Assessment: pending Estimated Needs Total Energy Estimated Needs: 1500-1700kcal/day Total Protein Estimated Needs: 75-85g/day Total Fluid Estimated Needs: 1.5-1.7L/day Current Nutrition:  NPO  Plan:  TPN at goal rate of 65 mL/hr at 1800 (total volume including overfill 1660 mL)  Nutritional components: Amino acids (using 10% Travasol): 78 grams Dextrose: 234 grams Lipids (using 20% SMOFlipid): 51.4 grams kCal: 1621/24 hours Electrolytes in TPN (standard): Na 38mEq/L, K 59mEq/L, Ca 53mEq/L, Mg 72mEq/L, and Phos 60mmol/L. Cl:Ac 1:1  Add standard MVI and trace elements to TPN Add thiamine 100 mg to TPN x 3 days  continue Sensitive q6h SSI and adjust as needed  Monitor TPN labs on Mon/Thurs, daily until stable  Darnelle Bos, PharmD 07/21/2021,9:32 AM

## 2021-07-22 DIAGNOSIS — K559 Vascular disorder of intestine, unspecified: Secondary | ICD-10-CM | POA: Diagnosis not present

## 2021-07-22 LAB — BASIC METABOLIC PANEL
Anion gap: 4 — ABNORMAL LOW (ref 5–15)
BUN: 32 mg/dL — ABNORMAL HIGH (ref 8–23)
CO2: 26 mmol/L (ref 22–32)
Calcium: 7.6 mg/dL — ABNORMAL LOW (ref 8.9–10.3)
Chloride: 107 mmol/L (ref 98–111)
Creatinine, Ser: 1.16 mg/dL — ABNORMAL HIGH (ref 0.44–1.00)
GFR, Estimated: 45 mL/min — ABNORMAL LOW (ref >60)
Glucose, Bld: 115 mg/dL — ABNORMAL HIGH (ref 70–99)
Potassium: 4 mmol/L (ref 3.5–5.1)
Sodium: 137 mmol/L (ref 135–145)

## 2021-07-22 LAB — MAGNESIUM: Magnesium: 1.8 mg/dL (ref 1.7–2.4)

## 2021-07-22 LAB — GLUCOSE, CAPILLARY
Glucose-Capillary: 117 mg/dL — ABNORMAL HIGH (ref 70–99)
Glucose-Capillary: 126 mg/dL — ABNORMAL HIGH (ref 70–99)
Glucose-Capillary: 132 mg/dL — ABNORMAL HIGH (ref 70–99)

## 2021-07-22 LAB — PHOSPHORUS: Phosphorus: 3.5 mg/dL (ref 2.5–4.6)

## 2021-07-22 MED ORDER — TRAVASOL 10 % IV SOLN
INTRAVENOUS | Status: AC
  Filled 2021-07-22: qty 842.4

## 2021-07-22 NOTE — Plan of Care (Signed)
  Problem: Clinical Measurements: Goal: Ability to maintain clinical measurements within normal limits will improve Outcome: Progressing Goal: Will remain free from infection Outcome: Progressing Goal: Diagnostic test results will improve Outcome: Progressing Goal: Respiratory complications will improve Outcome: Progressing Goal: Cardiovascular complication will be avoided Outcome: Progressing   Problem: Pain Managment: Goal: General experience of comfort will improve Outcome: Progressing   Pt is involved in and agrees with the plan of care. V/S stable. Denies any pain. Clear liquids tolerated. BM noted 1x this shift.

## 2021-07-22 NOTE — Progress Notes (Signed)
[facility] SURGICAL PROGRESS NOTE (cpt [phone])  Hospital Day(s): 8.   Interval History: Patient seen and examined, no acute events or new complaints overnight. Patient reports she is doing well, still distended but without any abdominal pain. She denies fever, chills, nausea, emesis. Her renal function remains at her baseline; sCr - 1.16; UO - 200 ccs + unmeasured. No significant electrolyte derangements. NGT was removed yesterday (12/06) after passing clamping trial. She has been on CLD + TPN at goal rate without issues. She continues to have bowel function.   Review of Systems:  Constitutional: denies fever, chills  HEENT: denies cough or congestion  Respiratory: denies any shortness of breath  Cardiovascular: denies chest pain or palpitations  Gastrointestinal: + distension (improved), denies abdominal pain, N/V,  Genitourinary: denies burning with urination or urinary frequency  Vital signs in last 24 hours: [min-max] current  Temp:  [98.4 F (36.9 C)-99 F (37.2 C)] 98.4 F (36.9 C) (12/07 0420) Pulse Rate:  [79-97] 79 (12/07 0420) Resp:  [17-20] 20 (12/07 0420) BP: (137-155)/(53-64) 137/56 (12/07 0420) SpO2:  [93 %-100 %] 94 % (12/07 0420)     Height: 5\' 7"  (170.2 cm) Weight: 77.9 kg BMI (Calculated): 26.89   Intake/Output last 2 shifts:  12/06 0701 - 12/07 0700 In: 1935.1 [P.O.:60; I.V.:1675.1; IV Piggyback:200] Out: 200 [Urine:200]   Physical Exam:  Constitutional: alert, cooperative and no distress  HENT: normocephalic without obvious abnormality Eyes: PERRL, EOM's grossly intact and symmetric  Respiratory: breathing non-labored at rest; no audible wheezing Cardiovascular: regular rate and sinus rhythm  Gastrointestinal: Soft, she remains distended but improving some, on-tender, no rebound/guarding. She remains certainly without peritonitis  Musculoskeletal: no edema or wounds, motor and sensation grossly intact, NT    Labs:  CBC Latest Ref Rng &  Units 07/21/2021 07/19/2021 07/18/2021  WBC 4.0 - 10.5 K/uL 9.8 7.8 8.3  Hemoglobin 12.0 - 15.0 g/dL 9.0(L) 10.1(L) 9.2(L)  Hematocrit 36.0 - 46.0 % 28.3(L) 30.7(L) 28.7(L)  Platelets 150 - 400 K/uL 212 222 187   CMP Latest Ref Rng & Units 07/22/2021 07/21/2021 07/20/2021  Glucose 70 - 99 mg/dL 115(H) 125(H) 164(H)  BUN 8 - 23 mg/dL 32(H) 37(H) 42(H)  Creatinine 0.44 - 1.00 mg/dL 1.16(H) 1.21(H) 1.35(H)  Sodium 135 - 145 mmol/L 137 141 139  Potassium 3.5 - 5.1 mmol/L 4.0 4.1 3.6  Chloride 98 - 111 mmol/L 107 109 110  CO2 22 - 32 mmol/L 26 26 24   Calcium 8.9 - 10.3 mg/dL 7.6(L) 7.8(L) 7.8(L)  Total Protein 6.5 - 8.1 g/dL - - 5.3(L)  Total Bilirubin 0.3 - 1.2 mg/dL - - 0.4  Alkaline Phos 38 - 126 U/L - - 43  AST 15 - 41 U/L - - 13(L)  ALT 0 - 44 U/L - - 8     Imaging studies: No new pertinent imaging studies   Assessment/Plan: (ICD-10's: K40.89) 87 y.o. female with clinically improving ileus (vs SBO vs stricture) initially admitted with pneumatosis of small intestine and portal venous gas concerning for ischemic enteritis.              - Continue CLD for now; will hold on advancing until distension improves further             - Continue TPN; at goal; monitor electrolytes - Continue IV Abx (Cefepime, Flagyl); azithromycin added by medicine overnight for potential bronchitis  - Monitor abdominal examination +/- serial KUBs - Okay to mobilize             -  Further management per primary service; we will follow    All of the above findings and recommendations were discussed with the patient, patient's family, and the medical team, and all of patient's and family's questions were answered to their expressed satisfaction.  -- Edison Simon, PA-C [facility] 07/22/2021, 7:33 AM [phone] M-F: 7am - 4pm

## 2021-07-22 NOTE — Progress Notes (Signed)
PHARMACY - TOTAL PARENTERAL NUTRITION CONSULT NOTE   Indication: Prolonged ileus  Patient Measurements: Height: 5\' 7"  (170.2 cm) Weight: 77.9 kg (171 lb 11.8 oz) IBW/kg (Calculated) : 61.6 TPN AdjBW (KG): 68 Body mass index is 26.9 kg/m.  Assessment:  87 y.o. female w/ PMH of  atrial fibrillation on Eliquis, CAD, hypertension, hyperlipidemia, ischemic colitis brought to the ED via EMS from [facility] with a chief complaint of generalized abdominal pain, nausea and diarrhea. TPN is being started since patient had been 5 to 7 days without adequate nutrition.  Glucose / Insulin: BG previous 24h: 111 - 125 (required 1 unit SSI) Electrolytes: stable, wnl Renal: SCr 2.44-->1.16 Hepatic: LFTs, TG wnl Intake / Output; MIVF: Net +6L since admit  GI Imaging: 12/02 CT abd: Extensive colonic diverticulosis without evidence superimposed acute diverticulitis on noncontrast examination. GI Surgeries / Procedures: none recent  Central access: 07/18/21 TPN start date: 07/18/21   Nutritional Goals: Goal TPN rate is 65 mL/hr (provides 84 g of protein and 1,647 kcals per day)  RD Assessment:  Estimated Needs Total Energy Estimated Needs: 1500-1700kcal/day Total Protein Estimated Needs: 75-85g/day Total Fluid Estimated Needs: 1.5-1.7L/day  Current Nutrition: CLD  Plan:  TPN at goal rate of 65 mL/hr at 1800 (total volume including overfill 1660 mL)  Nutritional components: Amino acids (using 10% Travasol): 84 grams Dextrose: 234 grams Lipids (using 20% SMOFlipid): 51.4 grams kCal: 1647/24 hours Electrolytes in TPN (standard): Na 12mEq/L, K 3mEq/L, Ca 61mEq/L, Mg 61mEq/L, and Phos 40mmol/L. Cl:Ac 1:1  Add standard MVI and trace elements to TPN continue Sensitive q6h SSI and adjust as needed  Monitor TPN labs on Mon/Thurs, daily until stable  Dallie Piles, PharmD 07/22/2021,6:49 AM

## 2021-07-22 NOTE — Progress Notes (Signed)
Occupational Therapy Treatment Patient Details Name: Jamie Hodges MRN: [id number] DOB: [birth year] Today's Date: 07/22/2021   History of present illness Pt is a 87 y/o F with PMH: Paroxysmal atrial fibrillation, HTN, colonic ischemia, CKD 3B, anemia of chronic disease who presented to the ED from [facility] with a complaint of generalized abdominal pain, nausea and diarrhea typical for her episodes of ischemic colitis.  Patient has refused surgery in the past and continues to decline surgical intervention.   OT comments  Pt seen for OT treatment on this date. Upon arrival to room, pt awake and seated upright in bed. Pt alert and oriented to self, place, month/year, and situation. Pt very motivated to participate in OT tx. Pt stood sink side for 49mins while completing grooming & hygiene tasks, requiring only MIN GUARD d/t intermittent posterior lean with b/l UE unsupported. Following grooming tasks, pt requested seated rest break, reporting 5/10 on RPE scale. Following seated rest break, pt walked 50 ft with SPC, requiring only MIN GUARD d/t decreased balance and activity tolerance. Pt is making good progress toward goals and continues to benefit from skilled OT services to maximize return to PLOF and minimize risk of future falls, injury, caregiver burden, and readmission. Given pt's progress, discharge recommendation updated to [place]; TOC informed.    Recommendations for follow up therapy are one component of a multi-disciplinary discharge planning process, led by the attending physician.  Recommendations may be updated based on patient status, additional functional criteria and insurance authorization.    Follow Up Recommendations  Home health OT    Assistance Recommended at Discharge Intermittent Supervision/Assistance  Equipment Recommendations  None recommended by OT       Precautions / Restrictions Precautions Precautions: Fall Restrictions Weight Bearing Restrictions: No        Mobility Bed Mobility Overal bed mobility: Needs Assistance Bed Mobility: Supine to Sit     Supine to sit: Supervision          Transfers Overall transfer level: Needs assistance Equipment used: Straight cane Transfers: Sit to/from Stand Sit to Stand: Min guard           General transfer comment: x3 bouts, with R hand on SPC     Balance Overall balance assessment: Needs assistance Sitting-balance support: Feet supported;No upper extremity supported Sitting balance-Leahy Scale: Good Sitting balance - Comments: Good sitting balance reaching within BOS at EOB   Standing balance support: No upper extremity supported;During functional activity Standing balance-Leahy Scale: Fair Standing balance comment: Requires MIN GUARD d/t intermittent posterior lean with b/l UE unsupported during standing grooming tasks                           ADL either performed or assessed with clinical judgement   ADL Overall ADL's : Needs assistance/impaired                                       General ADL Comments: Pt stood sink side for 91mins while completing grooming & hygiene tasks, requiring only MIN GUARD d/t intermittent posterior lean with b/l UE unsupported. Following seated rest break, pt walked 50 ft with SPC, requiring only MIN GUARD. Performed seated UB dressing with set-up assist     Vision Ability to See in Adequate Light: 0 Adequate Patient Visual Report: No change from baseline  Cognition Arousal/Alertness: Awake/alert Behavior During Therapy: WFL for tasks assessed/performed Overall Cognitive Status: Within Functional Limits for tasks assessed                                 General Comments: Alert and oriented to self, place, month/year, and situation. Requires cues to initiate energy conservation strategies. Able to follow multi-step instructions consistently                     Pertinent Vitals/ Pain        Pain Assessment: No/denies pain         Frequency  Min 2X/week        Progress Toward Goals  OT Goals(current goals can now be found in the care plan section)  Progress towards OT goals: Progressing toward goals  Acute Rehab OT Goals Patient Stated Goal: to go home OT Goal Formulation: With patient Time For Goal Achievement: 07/30/21 Potential to Achieve Goals: Good  Plan Discharge plan needs to be updated;Frequency remains appropriate       AM-PAC OT "6 Clicks" Daily Activity     Outcome Measure   Help from another person eating meals?: None Help from another person taking care of personal grooming?: A Little Help from another person toileting, which includes using toliet, bedpan, or urinal?: A Little Help from another person bathing (including washing, rinsing, drying)?: A Little Help from another person to put on and taking off regular upper body clothing?: A Little Help from another person to put on and taking off regular lower body clothing?: A Little 6 Click Score: 19    End of Session Equipment Utilized During Treatment: Gait belt;Other (comment) (SPC)  OT Visit Diagnosis: Unsteadiness on feet (R26.81);Muscle weakness (generalized) (M62.81)   Activity Tolerance Patient tolerated treatment well   Patient Left with call bell/phone within reach;in chair;with chair alarm set;with nursing/sitter in room   Nurse Communication Mobility status        Time: 0931-1000 OT Time Calculation (min): 29 min  Charges: OT General Charges $OT Visit: 1 Visit OT Treatments $Self Care/Home Management : 23-37 mins  Fredirick Maudlin, [place]

## 2021-07-22 NOTE — Progress Notes (Signed)
Physical Therapy Treatment Patient Details Name: Quintella Mura MRN: [id number] DOB: [birth year] Today's Date: 07/22/2021   History of Present Illness Pt is a 87 y/o F with PMH: Paroxysmal atrial fibrillation, HTN, colonic ischemia, CKD 3B, anemia of chronic disease who presented to the ED from [facility] with a complaint of generalized abdominal pain, nausea and diarrhea typical for her episodes of ischemic colitis.  Patient has refused surgery in the past and continues to decline surgical intervention.    PT Comments    Pt received on BSC. Agreeable to PT services. Reports independently performing perihygiene. Stands with minguard and progressed ambulation to 120' with SPC in RUE with minguard. Improved sequencing with step through pattern but does intermittently take multiple steps prior to progressing SPC. No unsteadiness noted and x2 small bouts of tight touch on IV pole within navigating room due to close quarters. Pt will continue to benefit from [facility] PT to further progress safe use of SPC with ambulation and improved endurance with standing and walking ADL's.    Recommendations for follow up therapy are one component of a multi-disciplinary discharge planning process, led by the attending physician.  Recommendations may be updated based on patient status, additional functional criteria and insurance authorization.  Follow Up Recommendations  Home health PT     Assistance Recommended at Discharge Intermittent Supervision/Assistance  Equipment Recommendations  None recommended by PT    Recommendations for Other Services       Precautions / Restrictions Precautions Precautions: Fall Restrictions Weight Bearing Restrictions: No     Mobility  Bed Mobility Overal bed mobility: Needs Assistance Bed Mobility: Supine to Sit     Supine to sit: Supervision     General bed mobility comments: in Recliner pre and post session Patient Response: Cooperative  Transfers Overall transfer  level: Needs assistance Equipment used: Straight cane Transfers: Sit to/from Stand Sit to Stand: Min guard           General transfer comment: x3 bouts, with R hand on [facility]    Ambulation/Gait Ambulation/Gait assistance: Min guard Gait Distance (Feet): 120 Feet Assistive device: Straight cane Gait Pattern/deviations: Step-to pattern;Narrow base of support;Shuffle;Decreased step length - right;Decreased step length - left       General Gait Details: IMproved sequencing today with SPC with step through pattern. Few bouts of taking multiple steps without SPC progresison but is overall stable. No need for touching objects to steady this session. Does intermittently lightly touch IV but no significnat need for UE on furntiture/other objects like previous sessions.   Stairs             Wheelchair Mobility    Modified Rankin (Stroke Patients Only)       Balance Overall balance assessment: Needs assistance Sitting-balance support: Feet supported;No upper extremity supported Sitting balance-Leahy Scale: Good Sitting balance - Comments: Good sitting balance reaching within BOS at EOB   Standing balance support: No upper extremity supported;During functional activity Standing balance-Leahy Scale: Fair Standing balance comment: Able to stand with minguard to don briefs after performing toileting task at [facility].                            Cognition Arousal/Alertness: Awake/alert Behavior During Therapy: WFL for tasks assessed/performed Overall Cognitive Status: Within Functional Limits for tasks assessed  General Comments: Alert and oriented to self, place, month/year, and situation. Requires cues to initiate energy conservation strategies. Able to follow multi-step instructions consistently        Exercises      General Comments        Pertinent Vitals/Pain Pain Assessment: No/denies pain    Home Living                           Prior Function            PT Goals (current goals can now be found in the care plan section) Acute Rehab PT Goals Patient Stated Goal: to return to independent living PT Goal Formulation: With patient Time For Goal Achievement: 07/31/21 Potential to Achieve Goals: Fair Progress towards PT goals: Progressing toward goals    Frequency    Min 2X/week      PT Plan Current plan remains appropriate    Co-evaluation              AM-PAC PT "6 Clicks" Mobility   Outcome Measure  Help needed turning from your back to your side while in a flat bed without using bedrails?: None Help needed moving from lying on your back to sitting on the side of a flat bed without using bedrails?: None Help needed moving to and from a bed to a chair (including a wheelchair)?: A Little Help needed standing up from a chair using your arms (e.g., wheelchair or bedside chair)?: A Little Help needed to walk in hospital room?: A Little Help needed climbing 3-5 steps with a railing? : A Lot 6 Click Score: 19    End of Session Equipment Utilized During Treatment: Gait belt Activity Tolerance: Patient tolerated treatment well Patient left: in chair;with call bell/phone within reach Nurse Communication: Mobility status PT Visit Diagnosis: Unsteadiness on feet (R26.81);Difficulty in walking, not elsewhere classified (R26.2);Muscle weakness (generalized) (M62.81)     Time: 5726-2035 PT Time Calculation (min) (ACUTE ONLY): 15 min  Charges:  $Gait Training: 8-22 mins                     Salem Caster. Fairly IV, PT, DPT Physical Therapist- [facility]  07/22/2021, 12:20 PM

## 2021-07-22 NOTE — Progress Notes (Addendum)
PROGRESS NOTE  Jamie Hodges ZMO:[id number] DOB: 1927-09-27 DOA: 07/14/2021 PCP: [name], MD  Brief History   Jamie Hodges is a 87 y.o. female with medical history significant for Paroxysmal atrial fibrillation, HTN, colonic ischemia, CKD 3B, anemia of chronic disease who presented to the ED from [facility] with a complaint of generalized abdominal pain, nausea and diarrhea typical for her episodes of ischemic colitis.  Patient has refused surgery in the past and continues to decline surgical intervention.   Consultants  Palliative care General Surgery  Procedures  None  Antibiotics   Anti-infectives (From admission, onward)    Start     Dose/Rate Route Frequency Ordered Stop   07/20/21 1200  azithromycin (ZITHROMAX) 250 mg in dextrose 5 % 125 mL IVPB  Status:  Discontinued        250 mg 125 mL/hr over 60 Minutes Intravenous Every 24 hours 07/19/21 1307 07/20/21 1645   07/19/21 1400  azithromycin (ZITHROMAX) 500 mg in sodium chloride 0.9 % 250 mL IVPB        500 mg 250 mL/hr over 60 Minutes Intravenous  Once 07/19/21 1306 07/19/21 1616   07/16/21 1000  cefTRIAXone (ROCEPHIN) 2 g in sodium chloride 0.9 % 100 mL IVPB        2 g 200 mL/hr over 30 Minutes Intravenous Every 24 hours 07/15/21 0903     07/15/21 0200  ceFEPIme (MAXIPIME) 2 g in sodium chloride 0.9 % 100 mL IVPB  Status:  Discontinued        2 g 200 mL/hr over 30 Minutes Intravenous Every 24 hours 07/14/21 1033 07/15/21 0903   07/14/21 2000  metroNIDAZOLE (FLAGYL) IVPB 500 mg        500 mg 100 mL/hr over 60 Minutes Intravenous Every 12 hours 07/14/21 1033     07/14/21 0800  meropenem (MERREM) 500 mg in sodium chloride 0.9 % 100 mL IVPB  Status:  Discontinued        500 mg 200 mL/hr over 30 Minutes Intravenous Every 12 hours 07/14/21 0529 07/14/21 0957   07/14/21 0200  ceFEPIme (MAXIPIME) 2 g in sodium chloride 0.9 % 100 mL IVPB        2 g 200 mL/hr over 30 Minutes Intravenous  Once 07/14/21 0156 07/14/21 0306    07/14/21 0200  metroNIDAZOLE (FLAGYL) IVPB 500 mg        500 mg 100 mL/hr over 60 Minutes Intravenous  Once 07/14/21 0156 07/14/21 0334   07/14/21 0200  vancomycin (VANCOCIN) IVPB 1000 mg/200 mL premix        1,000 mg 200 mL/hr over 60 Minutes Intravenous  Once 07/14/21 0156 07/14/21 0443        Subjective  Sitting up in chair. Tolerating liquid diet. Having BMs. No abd pain but feels distended, stable.  Objective   Vitals:  Vitals:   07/22/21 0420 07/22/21 0739  BP: (!) 137/56 (!) 144/59  Pulse: 79 88  Resp: 20 20  Temp: 98.4 F (36.9 C) 99.5 F (37.5 C)  SpO2: 94% 94%    Exam:  Constitutional:  The patient is awake, alert, and oriented x 3.  No acute distress. Respiratory:  No increased work of breathing. No wheezes, rales, or rhonchi. Cardiovascular:  Regular rate and rhythm No murmurs, ectopy, or gallups. Abdomen:  Abdomen is soft, moderately distended, non-tender Hypoactive bowel sounds.  Musculoskeletal:  No cyanosis, clubbing, or edema Skin:  No rashes, lesions, ulcers palpation of skin: no induration or nodules Neurologic:  Moving  all 4 extremities Psychiatric:  Alert judgment and insight appear intact  I have personally reviewed the following:   Today's Data  Vitals  Lab Data  BMP  Imaging  CT abdomen and pelvis CXR  Cardiology Data  EKG  Scheduled Meds:  amiodarone  200 mg Oral Daily   apixaban  5 mg Oral BID   Chlorhexidine Gluconate Cloth  6 each Topical Daily   diltiazem  360 mg Oral Daily   insulin aspart  0-9 Units Subcutaneous Q6H   ipratropium-albuterol  3 mL Nebulization BID   melatonin  2.5 mg Oral Once   metoprolol tartrate  50 mg Oral BID   sodium chloride flush  10-40 mL Intracatheter Q12H   Continuous Infusions:  sodium chloride 10 mL/hr at 07/19/21 2130   cefTRIAXone (ROCEPHIN)  IV 2 g (07/22/21 1117)   metronidazole 500 mg (07/22/21 1123)   TPN ADULT (ION) 65 mL/hr at 07/21/21 1816   TPN ADULT (ION)       Principal Problem:   Ischemic enteritis ([place]) Active Problems:   Essential hypertension   AF (paroxysmal atrial fibrillation) (HCC)   Cerebrovascular disease   Atrial fibrillation (HCC)   Stage 3b chronic kidney disease ([place])   CAD (coronary artery disease)   LOS: 8 days   A & P  Acute ischemic enteritis --pt continues to refuse surgical intervention but may be open to vascular intervention --started on broad-spectrum abx 11/29 Plan: --cont abx as ceftriaxone and flagyl, pharm advises 10-14 day course. Today is day 8 --diet per gen surg, clear liquids for now -IV pain meds and IV antiemetics - will touch base w/ vascular to see if they have anything to offer - continue TPN for now - pt/ot advising [facility] pt/ot  SBO vs Ileus: CT indicated possible development of ileus. NG tube removed 12/6. Tolerating clears today and having BMs - ADAT per gen surg  Pulmonary/peripheral edema: Improved with lasix. Lower extremity edema is resolved.    AF (paroxysmal atrial fibrillation) (HCC) --resumed home amiodarone, Cardizem and Lopressor --cont home Eliquis (with renal adjustment) -Heart rate is controlled.   Hypertension --resume dhome amiodarone, Cardizem and Lopressor.   AKI - 2/2 dehydration. resolved   Lactic acidosis  Resolved. Due to bowel ischemia.  I have seen and examined this patient myself. I have spent 40 minutes in her evaluation and care.    DVT prophylaxis: [id number] Code Status: DNR  Family Communication:  son updated telephonically 12/7 Level of care: Med-Surg Dispo:   The patient is from: [place] Anticipated d/c is to: to be determined Anticipated d/c date is: undetermined Patient currently is not medically ready to d/c due to: bowel ischemia, on IV abx  Laurey Arrow, MD [facility] Direct contact: see [email or web address]  7PM-7AM contact night coverage as above

## 2021-07-23 LAB — COMPREHENSIVE METABOLIC PANEL
ALT: 8 U/L (ref 0–44)
AST: 12 U/L — ABNORMAL LOW (ref 15–41)
Albumin: 2.4 g/dL — ABNORMAL LOW (ref 3.5–5.0)
Alkaline Phosphatase: 41 U/L (ref 38–126)
Anion gap: 6 (ref 5–15)
BUN: 26 mg/dL — ABNORMAL HIGH (ref 8–23)
CO2: 26 mmol/L (ref 22–32)
Calcium: 7.8 mg/dL — ABNORMAL LOW (ref 8.9–10.3)
Chloride: 108 mmol/L (ref 98–111)
Creatinine, Ser: 1.03 mg/dL — ABNORMAL HIGH (ref 0.44–1.00)
GFR, Estimated: 51 mL/min — ABNORMAL LOW (ref >60)
Glucose, Bld: 123 mg/dL — ABNORMAL HIGH (ref 70–99)
Potassium: 4.4 mmol/L (ref 3.5–5.1)
Sodium: 140 mmol/L (ref 135–145)
Total Bilirubin: 0.4 mg/dL (ref 0.3–1.2)
Total Protein: 5 g/dL — ABNORMAL LOW (ref 6.5–8.1)

## 2021-07-23 LAB — GLUCOSE, CAPILLARY
Glucose-Capillary: 115 mg/dL — ABNORMAL HIGH (ref 70–99)
Glucose-Capillary: 115 mg/dL — ABNORMAL HIGH (ref 70–99)
Glucose-Capillary: 138 mg/dL — ABNORMAL HIGH (ref 70–99)
Glucose-Capillary: 94 mg/dL (ref 70–99)

## 2021-07-23 LAB — PHOSPHORUS: Phosphorus: 3.5 mg/dL (ref 2.5–4.6)

## 2021-07-23 LAB — MAGNESIUM: Magnesium: 1.9 mg/dL (ref 1.7–2.4)

## 2021-07-23 MED ORDER — ENSURE ENLIVE PO LIQD
237.0000 mL | Freq: Three times a day (TID) | ORAL | Status: DC
  Administered 2021-07-23: 237 mL via ORAL

## 2021-07-23 MED ORDER — TRAVASOL 10 % IV SOLN
INTRAVENOUS | Status: DC
  Filled 2021-07-23: qty 842.4

## 2021-07-23 NOTE — Progress Notes (Signed)
PROGRESS NOTE  Jamie Hodges ZOX:[id number] DOB: 05/07/1928 DOA: 07/14/2021 PCP: [name], MD  Brief History   Jamie Hodges is a 87 y.o. female with medical history significant for Paroxysmal atrial fibrillation, HTN, colonic ischemia, CKD 3B, anemia of chronic disease who presented to the ED from [facility] with a complaint of generalized abdominal pain, nausea and diarrhea typical for her episodes of ischemic colitis.  Patient has refused surgery in the past and continues to decline surgical intervention.   Consultants  Palliative care General Surgery  Procedures  None  Antibiotics   Anti-infectives (From admission, onward)    Start     Dose/Rate Route Frequency Ordered Stop   07/20/21 1200  azithromycin (ZITHROMAX) 250 mg in dextrose 5 % 125 mL IVPB  Status:  Discontinued        250 mg 125 mL/hr over 60 Minutes Intravenous Every 24 hours 07/19/21 1307 07/20/21 1645   07/19/21 1400  azithromycin (ZITHROMAX) 500 mg in sodium chloride 0.9 % 250 mL IVPB        500 mg 250 mL/hr over 60 Minutes Intravenous  Once 07/19/21 1306 07/19/21 1616   07/16/21 1000  cefTRIAXone (ROCEPHIN) 2 g in sodium chloride 0.9 % 100 mL IVPB        2 g 200 mL/hr over 30 Minutes Intravenous Every 24 hours 07/15/21 0903     07/15/21 0200  ceFEPIme (MAXIPIME) 2 g in sodium chloride 0.9 % 100 mL IVPB  Status:  Discontinued        2 g 200 mL/hr over 30 Minutes Intravenous Every 24 hours 07/14/21 1033 07/15/21 0903   07/14/21 2000  metroNIDAZOLE (FLAGYL) IVPB 500 mg        500 mg 100 mL/hr over 60 Minutes Intravenous Every 12 hours 07/14/21 1033     07/14/21 0800  meropenem (MERREM) 500 mg in sodium chloride 0.9 % 100 mL IVPB  Status:  Discontinued        500 mg 200 mL/hr over 30 Minutes Intravenous Every 12 hours 07/14/21 0529 07/14/21 0957   07/14/21 0200  ceFEPIme (MAXIPIME) 2 g in sodium chloride 0.9 % 100 mL IVPB        2 g 200 mL/hr over 30 Minutes Intravenous  Once 07/14/21 0156 07/14/21 0306    07/14/21 0200  metroNIDAZOLE (FLAGYL) IVPB 500 mg        500 mg 100 mL/hr over 60 Minutes Intravenous  Once 07/14/21 0156 07/14/21 0334   07/14/21 0200  vancomycin (VANCOCIN) IVPB 1000 mg/200 mL premix        1,000 mg 200 mL/hr over 60 Minutes Intravenous  Once 07/14/21 0156 07/14/21 0443        Subjective  Sitting up in chair. Tolerating full liquid diet. Having BMs. No abd pain but feels distended, stable.  Objective   Vitals:  Vitals:   07/23/21 0833 07/23/21 0919  BP: (!) 165/61   Pulse: 91   Resp: 16   Temp: 98.4 F (36.9 C)   SpO2: 96% 95%    Exam:  Constitutional:  The patient is awake, alert, and oriented x 3.  No acute distress. Respiratory:  No increased work of breathing. No wheezes, rales, or rhonchi. Cardiovascular:  Regular rate and rhythm No murmurs, ectopy, or gallups. Abdomen:  Abdomen is soft, moderately distended, non-tender Hypoactive bowel sounds.  Musculoskeletal:  No cyanosis, clubbing, or edema Skin:  No rashes, lesions, ulcers palpation of skin: no induration or nodules Neurologic:  Moving all 4 extremities  Psychiatric:  Alert judgment and insight appear intact  I have personally reviewed the following:   Today's Data  Vitals  Lab Data  BMP  Imaging  CT abdomen and pelvis CXR  Cardiology Data  EKG  Scheduled Meds:  amiodarone  200 mg Oral Daily   apixaban  5 mg Oral BID   Chlorhexidine Gluconate Cloth  6 each Topical Daily   diltiazem  360 mg Oral Daily   feeding supplement  237 mL Oral TID BM   insulin aspart  0-9 Units Subcutaneous Q6H   melatonin  2.5 mg Oral Once   metoprolol tartrate  50 mg Oral BID   sodium chloride flush  10-40 mL Intracatheter Q12H   Continuous Infusions:  sodium chloride 10 mL/hr at 07/19/21 2130   cefTRIAXone (ROCEPHIN)  IV 2 g (07/23/21 0939)   metronidazole 500 mg (07/23/21 0831)   TPN ADULT (ION) 65 mL/hr at 07/22/21 1743   TPN ADULT (ION)      Principal Problem:   Ischemic  enteritis ([place]) Active Problems:   Essential hypertension   AF (paroxysmal atrial fibrillation) (HCC)   Cerebrovascular disease   Atrial fibrillation (HCC)   Stage 3b chronic kidney disease ([place])   CAD (coronary artery disease)   LOS: 9 days   A & P  Acute ischemic enteritis --pt continues to refuse surgical intervention but may be open to vascular intervention --started on broad-spectrum abx 11/29 Plan: --cont abx as ceftriaxone and flagyl, pharm advises 10-14 day course. Today is day 9 --diet per gen surg, advanced to full liquids today - vascular to see today, wondering if they have anything to offer - continue TPN for now per gen surg, likely decrease vs. D/c tomorrow - pt/ot advising [facility] pt/ot  SBO vs Ileus: CT indicated possible development of ileus. NG tube removed 12/6. Tolerating advancing diet and passing stool/flatus - ADAT per gen surg  Pulmonary/peripheral edema: Improved with lasix. Lower extremity edema is resolved.    AF (paroxysmal atrial fibrillation) (HCC) --resumed home amiodarone, Cardizem and Lopressor --cont home Eliquis (with renal adjustment) - Heart rate is controlled.   Hypertension --resume dhome amiodarone, Cardizem and Lopressor.   AKI - 2/2 dehydration. resolved   Lactic acidosis  Resolved. Due to bowel ischemia.    DVT prophylaxis: [id number] Code Status: DNR  Family Communication:  son updated @ bedside 12/8 Level of care: Med-Surg Dispo:   The patient is from: [place] Anticipated d/c is to: to be determined Anticipated d/c date is: undetermined Patient currently is not medically ready to d/c due to: bowel ischemia, on IV abx  Laurey Arrow, MD [facility] Direct contact: see [email or web address]  7PM-7AM contact night coverage as above

## 2021-07-23 NOTE — Progress Notes (Signed)
[facility] SURGICAL PROGRESS NOTE (cpt [phone])  Hospital Day(s): 9.   Interval History: Patient seen and examined, no acute events or new complaints overnight. Patient reports she continues to feel better aside from constant diarrhea. She is distended but this continues to improve. No abdominal pain. She denies fever, chills, nausea, emesis. Her renal function remains at her baseline; sCr - 1.03; UO - 800 ccs + unmeasured. No significant electrolyte derangements. She has been on CLD + TPN at goal rate without issues. She continues to have bowel function with multiple BMs recorded.   Review of Systems:  Constitutional: denies fever, chills  HEENT: denies cough or congestion  Respiratory: denies any shortness of breath  Cardiovascular: denies chest pain or palpitations  Gastrointestinal: + distension (improved), denies abdominal pain, N/V,  Genitourinary: denies burning with urination or urinary frequency  Vital signs in last 24 hours: [min-max] current  Temp:  [98.1 F (36.7 C)-99.5 F (37.5 C)] 98.7 F (37.1 C) (12/08 0609) Pulse Rate:  [81-89] 85 (12/08 0609) Resp:  [16-20] 16 (12/08 0609) BP: (138-154)/(45-63) 140/45 (12/08 0609) SpO2:  [94 %-95 %] 94 % (12/08 0609)     Height: 5\' 7"  (170.2 cm) Weight: 77.9 kg BMI (Calculated): 26.89   Intake/Output last 2 shifts:  12/07 0701 - 12/08 0700 In: 1095.5 [P.O.:660; I.V.:435.5] Out: 800 [Urine:800]   Physical Exam:  Constitutional: alert, cooperative and no distress  HENT: normocephalic without obvious abnormality Eyes: PERRL, EOM's grossly intact and symmetric  Respiratory: breathing non-labored at rest; no audible wheezing Cardiovascular: regular rate and sinus rhythm  Gastrointestinal: Soft, she remains distended but improved from days prior, still tympanic centrally, non-tender, no rebound/guarding. She remains certainly without peritonitis  Musculoskeletal: no edema or wounds, motor and sensation grossly intact,  NT    Labs:  CBC Latest Ref Rng & Units 07/21/2021 07/19/2021 07/18/2021  WBC 4.0 - 10.5 K/uL 9.8 7.8 8.3  Hemoglobin 12.0 - 15.0 g/dL 9.0(L) 10.1(L) 9.2(L)  Hematocrit 36.0 - 46.0 % 28.3(L) 30.7(L) 28.7(L)  Platelets 150 - 400 K/uL 212 222 187   CMP Latest Ref Rng & Units 07/23/2021 07/22/2021 07/21/2021  Glucose 70 - 99 mg/dL 123(H) 115(H) 125(H)  BUN 8 - 23 mg/dL 26(H) 32(H) 37(H)  Creatinine 0.44 - 1.00 mg/dL 1.03(H) 1.16(H) 1.21(H)  Sodium 135 - 145 mmol/L 140 137 141  Potassium 3.5 - 5.1 mmol/L 4.4 4.0 4.1  Chloride 98 - 111 mmol/L 108 107 109  CO2 22 - 32 mmol/L 26 26 26   Calcium 8.9 - 10.3 mg/dL 7.8(L) 7.6(L) 7.8(L)  Total Protein 6.5 - 8.1 g/dL 5.0(L) - -  Total Bilirubin 0.3 - 1.2 mg/dL 0.4 - -  Alkaline Phos 38 - 126 U/L 41 - -  AST 15 - 41 U/L 12(L) - -  ALT 0 - 44 U/L 8 - -     Imaging studies: No new pertinent imaging studies   Assessment/Plan: (ICD-10's: K74.89) 87 y.o. female with clinically improving ileus (vs SBO vs stricture) initially admitted with pneumatosis of small intestine and portal venous gas concerning for ischemic enteritis.              - I think we can advance to full liquids today            - Would continue TPN at goal rate until certain she is tolerating full liquids; monitor electrolytes - Continue IV Abx (Cefepime, Flagyl); azithromycin added by medicine overnight for potential bronchitis  - Still refusing surgical interventions  -  Monitor abdominal examination +/- serial KUBs - Okay to mobilize             - Further management per primary service; we will follow    All of the above findings and recommendations were discussed with the patient, patient's family (son), and the medical team, and all of patient's and family's questions were answered to their expressed satisfaction.  -- Edison Simon, PA-C [facility] 07/23/2021, 7:29 AM [phone] M-F: 7am - 4pm

## 2021-07-23 NOTE — Progress Notes (Addendum)
Mobility Specialist - Progress Note   07/23/21 1300  Mobility  Activity Ambulated in hall  Level of Assistance Contact guard assist, steadying assist  Assistive Device Cane  Distance Ambulated (ft) 180 ft  Mobility Ambulated with assistance in hallway;Out of bed to chair with meals;Out of bed for toileting  Mobility Response Tolerated well  Mobility performed by Mobility specialist  $Mobility charge 1 Mobility    During mobility: 103 HR, 92% SpO2 Post-mobility: 94 HR, 94% SpO2   Pt sitting on BSC upon arrival, utilizing RA. Independent for peri-care. Ambulated in hallway with CGA and SPC. Does occasionally reach out for IV pole to steady, but no LOB. Mild SOB, no other complaints. Pt returned to recliner post-session. Alarm set. Son at bedside.    Kathee Delton Mobility Specialist 07/23/21, 2:12 PM

## 2021-07-23 NOTE — Progress Notes (Signed)
PHARMACY - TOTAL PARENTERAL NUTRITION CONSULT NOTE   Indication: Prolonged ileus  Patient Measurements: Height: 5\' 7"  (170.2 cm) Weight: 77.9 kg (171 lb 11.8 oz) IBW/kg (Calculated) : 61.6 TPN AdjBW (KG): 68 Body mass index is 26.9 kg/m.  Assessment:  87 y.o. female w/ PMH of  atrial fibrillation on Eliquis, CAD, hypertension, hyperlipidemia, ischemic colitis brought to the ED via EMS from [facility] with a chief complaint of generalized abdominal pain, nausea and diarrhea. TPN is being started since patient had been 5 to 7 days without adequate nutrition.  Glucose / Insulin: BG previous 24h: 94 - 132 (required 2 units SSI) Electrolytes: stable, wnl Renal: SCr 2.44-->1.03 Hepatic: LFTs, TG wnl Intake / Output; MIVF: Net +8L since admit  GI Imaging: 12/02 CT abd: Extensive colonic diverticulosis without evidence superimposed acute diverticulitis on noncontrast examination. GI Surgeries / Procedures: none recent  Central access: 07/18/21 TPN start date: 07/18/21   Nutritional Goals: Goal TPN rate is 65 mL/hr (provides 84 g of protein and 1,647 kcals per day)  RD Assessment:  Estimated Needs Total Energy Estimated Needs: 1500-1700kcal/day Total Protein Estimated Needs: 75-85g/day Total Fluid Estimated Needs: 1.5-1.7L/day  Current Nutrition: CLD  Plan:  TPN at goal rate of 65 mL/hr at 1800 (total volume including overfill 1660 mL)  Nutritional components: Amino acids (using 10% Travasol): 84 grams Dextrose: 234 grams Lipids (using 20% SMOFlipid): 51.4 grams kCal: 1647/24 hours Electrolytes in TPN (standard): Na 38mEq/L, K 15mEq/L, Ca 68mEq/L, Mg 21mEq/L, and Phos 65mmol/L. Cl:Ac 1:1  Add standard MVI and trace elements to TPN continue Sensitive q6h SSI and adjust as needed  Monitor TPN labs on Mon/Thurs  Dallie Piles, PharmD 07/23/2021,7:34 AM

## 2021-07-23 NOTE — Progress Notes (Signed)
Nutrition Follow Up Note   DOCUMENTATION CODES:   Not applicable  INTERVENTION:   Continue TPN per pharmacy   Daily weights   Ensure Enlive po TID, each supplement provides 350 kcal and 20 grams of protein  NUTRITION DIAGNOSIS:   Inadequate oral intake related to acute illness as evidenced by NPO status.  GOAL:   Patient will meet greater than or equal to 90% of their needs -met with TPN   MONITOR:   PO intake, Supplement acceptance, Diet advancement, Labs, Weight trends, Skin, I & O's, Other (Comment) (TPN)  ASSESSMENT:   87 y.o. female with past medical history of paroxysmal atrial fibrillation, HTN, HLD, recurrent episodes of acute colonic ischemia, CKD 3B and anemia of chronic disease who was admitted on 07/14/2021 with generalized abdominal pain, nausea and diarrhea.  Met with pt in room today. Pt reports that she is feeling much better. Pt tolerating TPN at goal rate. Refeed labs stable. Pt advanced to a full liquid diet today. Pt reports that she is not hungry today. RD discussed with pt the importance of adequate nutrition needed to preserve lean muscle. Pt is willing to drink chocolate Ensure. RD will add supplements to help pt meet her estimated needs. Per chart, pt is up ~21lbs since admit; pt +8.0L since admit. Pt reports passing gas and BMs today. Pt is working with PT.   Medications reviewed and include: insulin, melatonin, ceftriaxone, metronidazole    Labs reviewed: K 4.4 wnl, BUN 26(H), Creat 1.03(H), P 3.5 wnl, Mg 1.9 wnl  Triglycerides 114- 12/4 Hgb 9.0(L), Hct 28.3(L) Cbgs- 138, 115, 94 x 24 hrs  Diet Order:   Diet Order             Diet full liquid Room service appropriate? Yes; Fluid consistency: Thin  Diet effective now                  EDUCATION NEEDS:   No education needs have been identified at this time  Skin:  Skin Assessment: Reviewed RN Assessment  Last BM:  12/8- type 7  Height:   Ht Readings from Last 1 Encounters:   07/13/21 '5\' 7"'  (1.702 m)    Weight:   Wt Readings from Last 1 Encounters:  07/21/21 77.9 kg    Ideal Body Weight:  61.36 kg  BMI:  Body mass index is 26.9 kg/m.  Estimated Nutritional Needs:   Kcal:  1500-1700kcal/day  Protein:  75-85g/day  Fluid:  1.5-1.7L/day  Koleen Distance MS, RD, LDN Please refer to [facility] for RD and/or RD on-call/weekend/after hours pager

## 2021-07-24 LAB — BASIC METABOLIC PANEL
Anion gap: 7 (ref 5–15)
BUN: 22 mg/dL (ref 8–23)
CO2: 24 mmol/L (ref 22–32)
Calcium: 7.8 mg/dL — ABNORMAL LOW (ref 8.9–10.3)
Chloride: 109 mmol/L (ref 98–111)
Creatinine, Ser: 1.03 mg/dL — ABNORMAL HIGH (ref 0.44–1.00)
GFR, Estimated: 51 mL/min — ABNORMAL LOW (ref >60)
Glucose, Bld: 124 mg/dL — ABNORMAL HIGH (ref 70–99)
Potassium: 4.7 mmol/L (ref 3.5–5.1)
Sodium: 140 mmol/L (ref 135–145)

## 2021-07-24 LAB — CBC
HCT: 27.3 % — ABNORMAL LOW (ref 36.0–46.0)
Hemoglobin: 8.4 g/dL — ABNORMAL LOW (ref 12.0–15.0)
MCH: 31.5 pg (ref 26.0–34.0)
MCHC: 30.8 g/dL (ref 30.0–36.0)
MCV: 102.2 fL — ABNORMAL HIGH (ref 80.0–100.0)
Platelets: 199 10*3/uL (ref 150–400)
RBC: 2.67 MIL/uL — ABNORMAL LOW (ref 3.87–5.11)
RDW: 14.6 % (ref 11.5–15.5)
WBC: 8.8 10*3/uL (ref 4.0–10.5)
nRBC: 0 % (ref 0.0–0.2)

## 2021-07-24 LAB — GLUCOSE, CAPILLARY
Glucose-Capillary: 106 mg/dL — ABNORMAL HIGH (ref 70–99)
Glucose-Capillary: 112 mg/dL — ABNORMAL HIGH (ref 70–99)
Glucose-Capillary: 115 mg/dL — ABNORMAL HIGH (ref 70–99)
Glucose-Capillary: 117 mg/dL — ABNORMAL HIGH (ref 70–99)
Glucose-Capillary: 90 mg/dL (ref 70–99)
Glucose-Capillary: 96 mg/dL (ref 70–99)

## 2021-07-24 MED ORDER — TRAVASOL 10 % IV SOLN
INTRAVENOUS | Status: AC
  Filled 2021-07-24: qty 453.6

## 2021-07-24 MED ORDER — LISINOPRIL 10 MG PO TABS
10.0000 mg | ORAL_TABLET | Freq: Every day | ORAL | Status: DC
  Administered 2021-07-24 – 2021-07-25 (×2): 10 mg via ORAL
  Filled 2021-07-24 (×2): qty 1

## 2021-07-24 MED ORDER — TRAVASOL 10 % IV SOLN
INTRAVENOUS | Status: DC
  Filled 2021-07-24: qty 453.6

## 2021-07-24 NOTE — Progress Notes (Signed)
Nutrition Follow-up  DOCUMENTATION CODES:   Not applicable  INTERVENTION:   -D/c Ensure Enlive po TID, each supplement provides 350 kcal and 20 grams of protein  -Magic cup TID with meals, each supplement provides 290 kcal and 9 grams of protein  -TPN management per pharmacy -Reviewed low fiber diet with pt' provided "Low Fiber Nutrition Therapy" handout from [facility] Nutrition Care Manual  NUTRITION DIAGNOSIS:   Inadequate oral intake related to acute illness as evidenced by NPO status.  Progressing; advanced to soft diet on 07/24/21  GOAL:   Patient will meet greater than or equal to 90% of their needs  Progressing   MONITOR:   PO intake, Supplement acceptance, Diet advancement, Labs, Weight trends, Skin, I & O's, Other (Comment) (TPN)  REASON FOR ASSESSMENT:   Consult Diet education  ASSESSMENT:   87 y.o. female with past medical history of paroxysmal atrial fibrillation, HTN, HLD, recurrent episodes of acute colonic ischemia, CKD 3B and anemia of chronic disease who was admitted on 07/14/2021 with generalized abdominal pain, nausea and diarrhea.  12/9- advanced to soft diet  Reviewed I/O's: +1.7 L x 24 hours and +9.8 L since admission  UOP: 200 ml x 24 hours   Spoke with pt at bedside, who reports that she is feeling better and is pleased with her progress. She is now on a soft diet and tolerating well. Noted pt consumed 50% of toast and and 75% of bagel with butter this morning. Noted meal completions 50%.  Per consult notes, pt son is concerned about pt's loose stools. Pt unsure if solid foods has improved this as she just started today. RD previously educated pt on low fiber diet; pt able to reach back basic principles to this RD. Handout also provided for further guidance at home.   Pt refusing Ensure supplements stating "they are just too much for me". RD reviewed other supplements on formulary, however, refused them all. RD discussed importance of good oral  intake to promote healing.   Per general surgery notes, plan to half TPN today with possible d/c tomorrow. Pt reports she is looking forward to going home.   Medications reviewed and include cardizem and melatonin.   Labs reviewed: CBGS: 94-132 (inpatient orders for glycemic control are 0-9 units insulin aspart every 6 hours).    Diet Order:   Diet Order             DIET SOFT Room service appropriate? Yes; Fluid consistency: Thin  Diet effective now                   EDUCATION NEEDS:   No education needs have been identified at this time  Skin:  Skin Assessment: Reviewed RN Assessment  Last BM:  07/23/21  Height:   Ht Readings from Last 1 Encounters:  07/13/21 5\' 7"  (1.702 m)    Weight:   Wt Readings from Last 1 Encounters:  07/21/21 77.9 kg    Ideal Body Weight:  61.36 kg  BMI:  Body mass index is 26.9 kg/m.  Estimated Nutritional Needs:   Kcal:  1500-1700kcal/day  Protein:  75-85g/day  Fluid:  1.5-1.7L/day    Loistine Chance, RD, LDN, [place] Registered Dietitian II Certified Diabetes Care and Education Specialist Please refer to [facility] for RD and/or RD on-call/weekend/after hours pager

## 2021-07-24 NOTE — Care Management Important Message (Signed)
Important Message  Patient Details  Name: Josaphine Shimamoto MRN: [id number] Date of Birth: 10-16-1927   Medicare Important Message Given:  Yes     Dannette Barbara 07/24/2021, 11:51 AM

## 2021-07-24 NOTE — TOC Progression Note (Addendum)
Transition of Care [facility]) - Progression Note    Patient Details  Name: Jamie Hodges MRN: [id number] Date of Birth: November 06, 1927  Transition of Care [facility]) CM/SW [place], LCSW Phone Number: 07/24/2021, 11:37 AM  Clinical Narrative:  Damaris Schooner with Seth Bake at [facility]. Staff do not feel that patient can stay alone right now so they are arranging for personal care services but would not be able to start until Monday. If family can stay with her then she can return over the weekend rather than waiting until Monday. Called son Mikki Santee and he stated his brother Laurey Arrow will be staying with her over the weekend. Confirmed with Pete at bedside. Spoke with [place] representative and they can have a nurse come out over the weekend for start of care.   4:50 pm: Plan is now for patient to go to SNF side at [facility]. They can accept her tomorrow. Per MD, patient will not discharge on nebulizer. Daughter-in-law is aware.  Expected Discharge Plan: Home/Self Care Barriers to Discharge: Continued Medical Work up  Expected Discharge Plan and Services Expected Discharge Plan: Home/Self Care     Post Acute Care Choice:  (TBD) Living arrangements for the past 2 months: [place]                                       Social Determinants of Health (SDOH) Interventions    Readmission Risk Interventions No flowsheet data found.

## 2021-07-24 NOTE — Progress Notes (Signed)
[facility]) Hospital Liaison Note  Notified by Dayton Scrape, LCSW [facility] manager of patient/family request for [facility] Palliative services at [facility] after discharge.  [facility] hospital liaison will follow patient for discharge disposition.  Please call with any hospice or outpatient palliative care related questions.  Thank you for the opportunity to participate in this patient's care.  Bobbie "Loren Racer, [place], [place] [phone]

## 2021-07-24 NOTE — Progress Notes (Signed)
Mobility Specialist - Progress Note   07/24/21 1600  Mobility  Activity Ambulated in hall  Level of Assistance Standby assist, set-up cues, supervision of patient - no hands on  Assistive Device Cane  Distance Ambulated (ft) 190 ft  Mobility Ambulated with assistance in hallway  Mobility Response Tolerated well  Mobility performed by Mobility specialist  $Mobility charge 1 Mobility    Post-mobility: 99 HR, 91% SpO2   Pt lying in bed upon arrival, utilizing RA. Ambulated in hallway with SPC, mild LOB x1 but self-corrected. Denied SOB. A little winded post-activity, mild wheezing noted. Pt left in bed with needs in reach, family at bedside.    Kathee Delton Mobility Specialist 07/24/21, 4:16 PM

## 2021-07-24 NOTE — NC FL2 (Addendum)
[place] LEVEL OF CARE SCREENING TOOL     IDENTIFICATION  Patient Name: Jamie Hodges Birthdate: 06-Nov-1927 Sex: female Admission Date (Current Location): 07/14/2021  [facility] and [place] Number:  Engineering geologist and Address:  [facility], [address] [place], [place] [address]      Provider Number: [id number]  Attending Physician Name and Address:  Gwynne Edinger, MD  Relative Name and Phone Number:       Current Level of Care: Hospital Recommended Level of Care: [place] Prior Approval Number:    Date Approved/Denied:   PASRR Number: [id number]  Discharge Plan: SNF    Current Diagnoses: Patient Active Problem List   Diagnosis Date Noted   Atrial fibrillation ([place]) 02/26/2021   Stage 3b chronic kidney disease ([place]) 02/26/2021   Anemia of chronic disease    Ischemic enteritis ([place]) 02/18/2021   Diarrhea    Right hip pain 03/17/2020   Aortic atherosclerosis ([place]) 07/20/2019   Arachnoid cyst 06/29/2018   Cerebrovascular disease 06/29/2018   Vertigo 06/23/2018   Preventative health care 02/17/2018   IBS (irritable bowel syndrome) 05/26/2016   Counseling regarding advanced directives 02/27/2014   Benign paroxysmal positional vertigo 05/11/2013   Chronic diarrhea 06/09/2011   Essential hypertension 09/16/2009   AF (paroxysmal atrial fibrillation) ([place]) 09/16/2009   DIVERTICULOSIS, COLON 09/16/2009   CAD (coronary artery disease) 2010    Orientation RESPIRATION BLADDER Height & Weight     Self, Time, Situation, Place  Normal Continent, External catheter Weight: 171 lb 11.8 oz (77.9 kg) Height:  5\' 7"  (170.2 cm)  BEHAVIORAL SYMPTOMS/MOOD NEUROLOGICAL BOWEL NUTRITION STATUS   (None)  (None) Continent Diet (Follow for discharge recommendations. Currently on soft diet. Will discontinue TPN on 12/10.)  AMBULATORY STATUS COMMUNICATION OF NEEDS Skin   Limited Assist Verbally Normal                        Personal Care Assistance Level of Assistance  Bathing, Feeding, Dressing Bathing Assistance: Limited assistance Feeding assistance: Independent Dressing Assistance: Limited assistance     Functional Limitations Info  Sight, Hearing, Speech Sight Info: Adequate Hearing Info: Adequate Speech Info: Adequate    SPECIAL CARE FACTORS FREQUENCY  PT (By licensed PT), OT (By licensed OT)     PT Frequency: 5 x week OT Frequency: 5 x week            Contractures Contractures Info: Not present    Additional Factors Info  Code Status, Allergies Code Status Info: DNR Allergies Info: Codeine Sulfate, Wild rice, Crestor (Rosuvastatin Calcium), Penicillins           Current Medications (07/24/2021):  This is the current hospital active medication list Current Facility-Administered Medications  Medication Dose Route Frequency Provider Last Rate Last Admin   0.9 %  sodium chloride infusion   Intravenous PRN Swayze, Ava, DO 10 mL/hr at 07/19/21 2130 New Bag at 07/19/21 2130   acetaminophen (TYLENOL) tablet 1,000 mg  1,000 mg Oral Q6H PRN Piscoya, Jose, MD       amiodarone (PACERONE) tablet 200 mg  200 mg Oral Daily Piscoya, Jose, MD   200 mg at 07/24/21 0828   apixaban (ELIQUIS) tablet 5 mg  5 mg Oral BID Swayze, Ava, DO   5 mg at 07/24/21 0827   Chlorhexidine Gluconate Cloth 2 % PADS 6 each  6 each Topical Daily Swayze, Ava, DO   6 each at 07/24/21 [phone]  diltiazem (CARDIZEM CD) 24 hr capsule 360 mg  360 mg Oral Daily Piscoya, Jose, MD   360 mg at 07/24/21 0827   insulin aspart (novoLOG) injection 0-9 Units  0-9 Units Subcutaneous Q6H Dallie Piles, RPH   1 Units at 07/23/21 1323   ipratropium-albuterol (DUONEB) 0.5-2.5 (3) MG/3ML nebulizer solution 3 mL  3 mL Nebulization Q2H PRN Swayze, Ava, DO   3 mL at 07/24/21 1436   lisinopril (ZESTRIL) tablet 10 mg  10 mg Oral Daily Wouk, Ailene Rud, MD   10 mg at 07/24/21 1432   melatonin tablet 2.5 mg  2.5 mg Oral Once Rise Patience, MD       menthol-cetylpyridinium (CEPACOL) lozenge 3 mg  1 lozenge Oral PRN Swayze, Ava, DO   3 mg at 07/21/21 0852   metoprolol tartrate (LOPRESSOR) injection 2.5 mg  2.5 mg Intravenous Q6H PRN Athena Masse, MD   2.5 mg at 07/15/21 0448   metoprolol tartrate (LOPRESSOR) tablet 50 mg  50 mg Oral BID Olean Ree, MD   50 mg at 07/24/21 0827   ondansetron (ZOFRAN) tablet 4 mg  4 mg Oral Q6H PRN Athena Masse, MD       Or   ondansetron [facility]) injection 4 mg  4 mg Intravenous Q6H PRN Athena Masse, MD   4 mg at 07/19/21 0226   phenol (CHLORASEPTIC) mouth spray 1 spray  1 spray Mouth/Throat PRN Swayze, Ava, DO       sodium chloride flush (NS) 0.9 % injection 10-40 mL  10-40 mL Intracatheter Q12H Swayze, Ava, DO   10 mL at 07/23/21 2056   sodium chloride flush (NS) 0.9 % injection 10-40 mL  10-40 mL Intracatheter PRN Swayze, Ava, DO       TPN ADULT (ION)   Intravenous Continuous TPN Vallery Sa D, RPH       TPN ADULT (ION)   Intravenous Continuous TPN Dallie Piles, RPH 35 mL/hr at 07/24/21 1227 Rate Change at 07/24/21 1227     Discharge Medications: Please see discharge summary for a list of discharge medications.  Relevant Imaging Results:  Relevant Lab Results:   Additional Information SS#: [id number]  Candie Chroman, LCSW

## 2021-07-24 NOTE — Progress Notes (Signed)
PROGRESS NOTE  Azzie Thiem EXB:[id number] DOB: April 29, 1928 DOA: 07/14/2021 PCP: [name], MD  Brief History   Amillia Biffle is a 87 y.o. female with medical history significant for Paroxysmal atrial fibrillation, HTN, colonic ischemia, CKD 3B, anemia of chronic disease who presented to the ED from [facility] with a complaint of generalized abdominal pain, nausea and diarrhea typical for her episodes of ischemic colitis.  Patient has refused surgery in the past and continues to decline surgical intervention.   Consultants  Palliative care General Surgery  Procedures  None  Antibiotics   Anti-infectives (From admission, onward)    Start     Dose/Rate Route Frequency Ordered Stop   07/20/21 1200  azithromycin (ZITHROMAX) 250 mg in dextrose 5 % 125 mL IVPB  Status:  Discontinued        250 mg 125 mL/hr over 60 Minutes Intravenous Every 24 hours 07/19/21 1307 07/20/21 1645   07/19/21 1400  azithromycin (ZITHROMAX) 500 mg in sodium chloride 0.9 % 250 mL IVPB        500 mg 250 mL/hr over 60 Minutes Intravenous  Once 07/19/21 1306 07/19/21 1616   07/16/21 1000  cefTRIAXone (ROCEPHIN) 2 g in sodium chloride 0.9 % 100 mL IVPB        2 g 200 mL/hr over 30 Minutes Intravenous Every 24 hours 07/15/21 0903     07/15/21 0200  ceFEPIme (MAXIPIME) 2 g in sodium chloride 0.9 % 100 mL IVPB  Status:  Discontinued        2 g 200 mL/hr over 30 Minutes Intravenous Every 24 hours 07/14/21 1033 07/15/21 0903   07/14/21 2000  metroNIDAZOLE (FLAGYL) IVPB 500 mg        500 mg 100 mL/hr over 60 Minutes Intravenous Every 12 hours 07/14/21 1033     07/14/21 0800  meropenem (MERREM) 500 mg in sodium chloride 0.9 % 100 mL IVPB  Status:  Discontinued        500 mg 200 mL/hr over 30 Minutes Intravenous Every 12 hours 07/14/21 0529 07/14/21 0957   07/14/21 0200  ceFEPIme (MAXIPIME) 2 g in sodium chloride 0.9 % 100 mL IVPB        2 g 200 mL/hr over 30 Minutes Intravenous  Once 07/14/21 0156 07/14/21 0306    07/14/21 0200  metroNIDAZOLE (FLAGYL) IVPB 500 mg        500 mg 100 mL/hr over 60 Minutes Intravenous  Once 07/14/21 0156 07/14/21 0334   07/14/21 0200  vancomycin (VANCOCIN) IVPB 1000 mg/200 mL premix        1,000 mg 200 mL/hr over 60 Minutes Intravenous  Once 07/14/21 0156 07/14/21 0443        Subjective  Sitting up in bed. Tolerating soft diet. Having BMs. No abd pain but feels distended, stable.  Objective   Vitals:  Vitals:   07/24/21 0429 07/24/21 0728  BP: (!) 154/60 (!) 164/68  Pulse: 84 89  Resp: 20 18  Temp: 98.1 F (36.7 C) 98.6 F (37 C)  SpO2: 96% 96%    Exam:  Constitutional:  The patient is awake, alert, and oriented x 3.  No acute distress. Respiratory:  No increased work of breathing. No wheezes, rales, or rhonchi. Cardiovascular:  Regular rate and rhythm No murmurs, ectopy, or gallups. Abdomen:  Abdomen is soft, moderately distended, non-tender Hypoactive bowel sounds.  Musculoskeletal:  No cyanosis, clubbing, or edema Skin:  No rashes, lesions, ulcers palpation of skin: no induration or nodules Neurologic:  Moving  all 4 extremities Psychiatric:  Alert judgment and insight appear intact  I have personally reviewed the following:   Today's Data  Vitals  Lab Data  BMP  Imaging  CT abdomen and pelvis CXR  Cardiology Data  EKG  Scheduled Meds:  amiodarone  200 mg Oral Daily   apixaban  5 mg Oral BID   Chlorhexidine Gluconate Cloth  6 each Topical Daily   diltiazem  360 mg Oral Daily   insulin aspart  0-9 Units Subcutaneous Q6H   melatonin  2.5 mg Oral Once   metoprolol tartrate  50 mg Oral BID   sodium chloride flush  10-40 mL Intracatheter Q12H   Continuous Infusions:  sodium chloride 10 mL/hr at 07/19/21 2130   cefTRIAXone (ROCEPHIN)  IV 2 g (07/24/21 1039)   metronidazole 500 mg (07/24/21 0824)   TPN ADULT (ION)     TPN ADULT (ION) 35 mL/hr at 07/24/21 1227    Principal Problem:   Ischemic enteritis ([place]) Active  Problems:   Essential hypertension   AF (paroxysmal atrial fibrillation) (HCC)   Cerebrovascular disease   Atrial fibrillation (HCC)   Stage 3b chronic kidney disease ([place])   CAD (coronary artery disease)   LOS: 10 days   A & P  Acute ischemic enteritis --pt continues to refuse surgical intervention but may be open to vascular intervention. Symptoms essentially resolved --started on broad-spectrum abx 11/29 Plan: --per gen surg ok to stop abx --continue to advance diet - vascular still to see - gen surg has started weaning tpn today, if continues to progress can likely d/c tomorrow - pt/ot advising [facility] pt/ot - has right picc, will plan to d/c at discharge  SBO vs Ileus: CT indicated possible development of ileus. NG tube removed 12/6. Tolerating advancing diet and passing stool/flatus - ADAT per gen surg  Pulmonary/peripheral edema: Improved with lasix. Lower extremity edema is resolved.    AF (paroxysmal atrial fibrillation) (HCC) --resumed home amiodarone, Cardizem and Lopressor --cont home Eliquis (with renal adjustment) - Heart rate is controlled.   Hypertension --resume dhome amiodarone, Cardizem and Lopressor. - add lisinopril 10 given persistent bp elevation   AKI - 2/2 dehydration. resolved   Lactic acidosis  Resolved. Due to bowel ischemia.    DVT prophylaxis: [id number] Code Status: DNR  Family Communication:  son updated @ bedside 12/8 Level of care: Med-Surg Dispo:   The patient is from: [place] Anticipated d/c is to: possibly tomorrow Patient currently is not medically ready to d/c due to: severity of illness  Laurey Arrow, MD [facility] Direct contact: see [email or web address]  7PM-7AM contact night coverage as above

## 2021-07-24 NOTE — Discharge Instructions (Signed)
Low Fiber Nutrition Therapy   You may need a low-fiber diet if you have Crohn's disease, diverticulitis, gastroparesis, ulcerative colitis, a new colostomy, or new ileostomy. A low-fiber diet may also be needed following radiation therapy to the pelvis and lower bowel or recent intestinal surgery.  A low-fiber diet reduces the frequency and volume of your stools. This lessens irritation to the gastrointestinal (GI) tract and can help you heal. Use this diet if you have a stricture so your intestine doesn't get blocked. The goal of this diet is to get less than 8 grams of fiber daily. It's also important to eat enough protein foods while you are on a low-fiber diet.  Drink nutrition supplements that have 1 gram of fiber or less in each serving. If your stricture is severe or if your inflammation is severe, drink more liquids to reduce symptoms and to get enough calories and protein.  Tips Eat about 5 to 6 small meals daily or about every 3 to 4 hours. Do not skip meals.  Every time you eat, include a small amount of protein (1 to 2 ounces) plus an additional food. Low fiber starch foods are the best choice to eat with protein.  Limit acidic, spicy and high-fat or fried and greasy foods to reduce GI symptoms.  Do not eat raw fruits and vegetables while on this diet. All fruits and vegetables need to be cooked and without peels or skins.  Drink a lot of fluids, at least 8 cups of fluid each day. Limit drinks with caffeine, sugar, and sugar substitutes.  Plain water is the best choice. Avoid mixing drink packets or flavor drops into water. .  Take a chewable multivitamin with minerals. Gummy vitamins do not have enough minerals and can block an ostomy and non-chewable supplements are not easily digested. Chewable supplements must be used if you have a stricture or ostomy.  If you are lactose intolerant, you may need to eat low-lactose dairy products. If you can't tolerate dairy, ask your RDN about how  you can get enough calcium from other foods.  Do not take a calcium supplement. They can cause a blockage.  It is important to add high-calcium foods gradually to your diet and monitor for symptoms to avoid a blockage.  Do not add more fiber to your diet until your health care provider or registered dietitian nutritionist (RDN) tells you it's OK. Fiber is part of whole grains, fruits and vegetables (foods from plants) and needs to be slowly added back in to your diet when your body is healed.  Choose foods that have been safely handled and prepared to lower your risk of foodborne illness. Talk to your RDN or see the Food Safety Nutrition Therapy handout for more information.   Foods Recommended These foods are low in fat and fiber and will help with your GI symptoms. Food Group Foods Recommended  Grains  Choose grain foods with less than 2 grams of fiber per serving. Refined white flour products--for example, enriched white bread without seeds, crackers or pasta Cream of wheat or rice Grits (fine ground) Tortillas: white flour or corn White rice, well-cooked (do not rinse, or soak before cooking) Cold and hot cereals made from white or refined flour such as puffed rice or corn flakes  Protein Foods  Lean, very tender, well-cooked poultry or fish; red meats: beef, pork or lamb (slow cook until soft; chop meats if you have stricture or ostomy) Eggs, well-cooked Smooth nut butters such as almond,  peanut, or sunflower Tofu  Dairy  If you have lactose intolerance, drinking milk products from cows or goats may make diarrhea worse. Foods marked with an asterisk (*) have lactose. Milk: fat-free, 1% or 2% * (choose best tolerated) Lactose-free milk Buttermilk* Fortified non-dairy milks: almond, cashew, coconut, or rice (be aware that these options are not good sources of protein so you will need to eat an additional protein food) Kefir* (Don't include kefir in the diet until approved by your health  care provider) Yogurt*/lactose-free yogurt (without nuts, fruit, granola or chocolate) Mild cheese* (hard and aged cheeses tend to be lower in lactose such as cheddar, swiss or parmesan) Cottage cheese* or lactose-free cottage cheese Low-fat ice cream* or lactose-free ice cream Sherbet* (usually lower lactose)  Vegetables  Canned and well-cooked vegetables without seeds, skins, or hulls  Carrots or green beans, cooked White, red or yellow potatoes without skins Strained vegetable juice  Fruit Soft, and well-cooked fruits without skins, seeds, or membranes Canned fruit in juice: peaches, pears, or applesauce Fruit juice without pulp diluted by half with water may be tolerated better Fruit drinks fortified with vitamin C may be tolerated better than 100% fruit juice  Oils  When possible, choose healthy oils and fats, such as olive and canola oils, plant oils rather than solid fats.  Other  Broth and strained soups made from allowed foods Desserts (small portions) without whole grains, seeds, nuts, raisins, or coconut Jelly (clear)   Foods Not Recommended These foods are higher in fat and fiber and may make your GI symptoms worse.  Food Group Foods Not Recommended  Grains  Bread, whole wheat or with whole grain flour or seeds or nuts Brown rice, quinoa, kasha, barley Tortillas: whole grain Whole wheat pasta Whole grain and high-fiber cereals, including oatmeal, bran flakes or shredded wheat Popcorn  Protein Foods  Steak, pork chops, or other meats that are fatty or have gristle Fried meat, poultry, or fish Seafood with a tough or rubbery texture, such as shrimp Luncheon meats such as bologna and salami Sausage, bacon, or hot dogs Dried beans, peas, or lentils Hummus Sushi Nuts and chunky nut butters  Dairy  Whole milk Pea milk and soymilk (may cause diarrhea, gas, bloating, and abdominal pain) Cream Half-and-half Sour cream Yogurt with added fruit, nuts, or granola or chocolate   Vegetables  Alfalfa or bean sprouts (high fiber and risk for bacteria) Raw or undercooked vegetables: beets; broccoli; brussels sprouts; cabbage; cauliflower; collard, mustard, or turnip greens; corn; cucumber; green peas or any kind of peas; kale; lima beans; mushrooms; okra; olives; pickles and relish; onions; parsnips; peppers; potato skins; sauerkraut; spinach; tomatoes  Fruit Raw fruit Dried fruit Avocado, berries, coconut Canned fruit in syrup Canned fruit with mandarin oranges, papaya or pineapple Fruit juice with pulp Prune juice Fruit skin  Oils  Pork rinds   Low-Fiber (8 grams) Sample 1-Day Menu  Breakfast  cup cream of wheat (0.5 gram fiber)  1 slice white toast (1 gram fiber)  1 teaspoon margarine, soft tub  2 scrambled eggs   Morning Snack 1 cup lactose-free nutrition supplement  Lunch 2 slices white bread (2 grams fiber)  3 tablespoons tuna  1 tablespoon mayonnaise  1 cup chicken noodle soup (1 gram fiber)   cup apple juice   Afternoon Snack 6 saltine crackers (0.5 gram fiber)  2 ounces low-fat cheddar cheese  Evening Meal 3 ounces tender chicken breast  1 cup white rice (0.5 gram fiber)   cup  cooked canned green beans (2 grams fiber)   cup cranberry juice   Evening Snack 1 cup lactose-free nutrition supplement   Copyright 2020  Academy of [employer] and Dietetics

## 2021-07-24 NOTE — Progress Notes (Signed)
[facility] SURGICAL PROGRESS NOTE (cpt [phone])  Hospital Day(s): 10.   Interval History: Patient seen and examined, no acute events or new complaints overnight. Patient reports she is feeling better and continues to make progress. She denied any abdominal pain, fever, chills, nausea, or emesis. She continues to remain without leukocytosis; WBC 8.8K. Hgb is stable at 8.4; suspect this is dilutional. Renal function is at her baseline; sCr - 1.03; UO - 200 ccs + unmeasured. No significant electrolyte derangements. She has tolerated full liquids. TPN at goal rate. She continues to have multiple bowel movements.   Review of Systems:  Constitutional: denies fever, chills  HEENT: denies cough or congestion  Respiratory: denies any shortness of breath  Cardiovascular: denies chest pain or palpitations  Gastrointestinal: + distension (improved), denies abdominal pain, N/V,  Genitourinary: denies burning with urination or urinary frequency  Vital signs in last 24 hours: [min-max] current  Temp:  [97.6 F (36.4 C)-98.7 F (37.1 C)] 98.6 F (37 C) (12/09 0728) Pulse Rate:  [80-91] 89 (12/09 0728) Resp:  [16-20] 18 (12/09 0728) BP: (145-165)/(60-68) 164/68 (12/09 0728) SpO2:  [95 %-100 %] 96 % (12/09 0728)     Height: 5\' 7"  (170.2 cm) Weight: 77.9 kg BMI (Calculated): 26.89   Intake/Output last 2 shifts:  12/08 0701 - 12/09 0700 In: 1903.4 [P.O.:120; I.V.:1383.4; IV Piggyback:400] Out: 200 [Urine:200]   Physical Exam:  Constitutional: alert, cooperative and no distress  HENT: normocephalic without obvious abnormality Eyes: PERRL, EOM's grossly intact and symmetric  Respiratory: breathing non-labored at rest; no audible wheezing Cardiovascular: regular rate and sinus rhythm  Gastrointestinal: Soft, distension continues to improve, closer to her baseline I suspect, non-tender, no rebound/guarding. She remains certainly without peritonitis Musculoskeletal: no edema or wounds,  motor and sensation grossly intact, NT   Labs:  CBC Latest Ref Rng & Units 07/24/2021 07/21/2021 07/19/2021  WBC 4.0 - 10.5 K/uL 8.8 9.8 7.8  Hemoglobin 12.0 - 15.0 g/dL 8.4(L) 9.0(L) 10.1(L)  Hematocrit 36.0 - 46.0 % 27.3(L) 28.3(L) 30.7(L)  Platelets 150 - 400 K/uL 199 212 222   CMP Latest Ref Rng & Units 07/24/2021 07/23/2021 07/22/2021  Glucose 70 - 99 mg/dL 124(H) 123(H) 115(H)  BUN 8 - 23 mg/dL 22 26(H) 32(H)  Creatinine 0.44 - 1.00 mg/dL 1.03(H) 1.03(H) 1.16(H)  Sodium 135 - 145 mmol/L 140 140 137  Potassium 3.5 - 5.1 mmol/L 4.7 4.4 4.0  Chloride 98 - 111 mmol/L 109 108 107  CO2 22 - 32 mmol/L 24 26 26   Calcium 8.9 - 10.3 mg/dL 7.8(L) 7.8(L) 7.6(L)  Total Protein 6.5 - 8.1 g/dL - 5.0(L) -  Total Bilirubin 0.3 - 1.2 mg/dL - 0.4 -  Alkaline Phos 38 - 126 U/L - 41 -  AST 15 - 41 U/L - 12(L) -  ALT 0 - 44 U/L - 8 -     Imaging studies: No new pertinent imaging studies   Assessment/Plan: (ICD-10's: K62.89) 87 y.o. female with clinically improving ileus (vs SBO vs stricture) initially admitted with pneumatosis of small intestine and portal venous gas concerning for ischemic enteritis.              - Soft diet today             - Wean TPN to half rate today; anticipate stopping tomorrow; monitor electrolytes - Continue IV Abx (Cefepime, Flagyl); azithromycin added by medicine overnight for potential bronchitis --> from a surgical perspective I think we can stop Abx for home as she  has completed 10 days and been without leukocytosis/fever for 1 week  - Still refusing surgical interventions  - Monitor abdominal examination +/- serial KUBs - Okay to mobilize             - Further management per primary service; we will follow    - Discharge Planning: Pending diet advancement today and weaning from TPN today/tomorrow. If she does well today, I think we can get her home tomorrow   All of the above findings and recommendations were discussed with the patient, patient's family (son),  and the medical team, and all of patient's and family's questions were answered to their expressed satisfaction.  -- Edison Simon, PA-C [facility] 07/24/2021, 7:39 AM [phone] M-F: 7am - 4pm

## 2021-07-24 NOTE — Plan of Care (Signed)

## 2021-07-24 NOTE — Progress Notes (Signed)
PHARMACY - TOTAL PARENTERAL NUTRITION CONSULT NOTE   Indication: Prolonged ileus  Patient Measurements: Height: 5\' 7"  (170.2 cm) Weight: 77.9 kg (171 lb 11.8 oz) IBW/kg (Calculated) : 61.6 TPN AdjBW (KG): 68 Body mass index is 26.9 kg/m.  Assessment:  87 y.o. female w/ PMH of  atrial fibrillation on Eliquis, CAD, hypertension, hyperlipidemia, ischemic colitis brought to the ED via EMS from [facility] with a chief complaint of generalized abdominal pain, nausea and diarrhea. TPN is being started since patient had been 5 to 7 days without adequate nutrition.  Glucose / Insulin: BG previous 24h: 115 - 138 (required 1 unit SSI) Electrolytes: stable, wnl Renal: SCr 2.44-->1.03 Hepatic: LFTs, TG wnl Intake / Output; MIVF: Net +8L since admit  GI Imaging: 12/02 CT abd: Extensive colonic diverticulosis without evidence superimposed acute diverticulitis on noncontrast examination. GI Surgeries / Procedures: none recent  Central access: 07/18/21 TPN start date: 07/18/21   Nutritional Goals: Goal TPN rate is 65 mL/hr (provides 84 g of protein and 1,647 kcals per day)  RD Assessment:  Estimated Needs Total Energy Estimated Needs: 1500-1700kcal/day Total Protein Estimated Needs: 75-85g/day Total Fluid Estimated Needs: 1.5-1.7L/day  Current Nutrition: advanced to soft diet  Plan:  Reduce TPN to 35 mL/hr at 1800  Electrolytes in TPN (standard): Na 97mEq/L, K 41mEq/L, Ca 57mEq/L, Mg 52mEq/L, and Phos 52mmol/L. Cl:Ac 1:1  Add standard MVI and trace elements to TPN continue Sensitive q6h SSI and adjust as needed  Monitor TPN labs on Mon/Thurs  Dallie Piles, PharmD 07/24/2021,7:24 AM

## 2021-07-25 DIAGNOSIS — N183 Chronic kidney disease, stage 3 unspecified: Secondary | ICD-10-CM | POA: Diagnosis not present

## 2021-07-25 DIAGNOSIS — K559 Vascular disorder of intestine, unspecified: Secondary | ICD-10-CM | POA: Diagnosis not present

## 2021-07-25 DIAGNOSIS — I129 Hypertensive chronic kidney disease with stage 1 through stage 4 chronic kidney disease, or unspecified chronic kidney disease: Secondary | ICD-10-CM | POA: Diagnosis not present

## 2021-07-25 DIAGNOSIS — M6281 Muscle weakness (generalized): Secondary | ICD-10-CM | POA: Diagnosis not present

## 2021-07-25 DIAGNOSIS — I480 Paroxysmal atrial fibrillation: Secondary | ICD-10-CM | POA: Diagnosis not present

## 2021-07-25 DIAGNOSIS — R609 Edema, unspecified: Secondary | ICD-10-CM | POA: Diagnosis not present

## 2021-07-25 DIAGNOSIS — N1832 Chronic kidney disease, stage 3b: Secondary | ICD-10-CM | POA: Diagnosis not present

## 2021-07-25 DIAGNOSIS — I10 Essential (primary) hypertension: Secondary | ICD-10-CM | POA: Diagnosis not present

## 2021-07-25 DIAGNOSIS — R278 Other lack of coordination: Secondary | ICD-10-CM | POA: Diagnosis not present

## 2021-07-25 DIAGNOSIS — R2681 Unsteadiness on feet: Secondary | ICD-10-CM | POA: Diagnosis not present

## 2021-07-25 DIAGNOSIS — Z741 Need for assistance with personal care: Secondary | ICD-10-CM | POA: Diagnosis not present

## 2021-07-25 DIAGNOSIS — K529 Noninfective gastroenteritis and colitis, unspecified: Secondary | ICD-10-CM | POA: Diagnosis not present

## 2021-07-25 LAB — BASIC METABOLIC PANEL
Anion gap: 4 — ABNORMAL LOW (ref 5–15)
BUN: 21 mg/dL (ref 8–23)
CO2: 28 mmol/L (ref 22–32)
Calcium: 7.9 mg/dL — ABNORMAL LOW (ref 8.9–10.3)
Chloride: 109 mmol/L (ref 98–111)
Creatinine, Ser: 0.99 mg/dL (ref 0.44–1.00)
GFR, Estimated: 54 mL/min — ABNORMAL LOW (ref >60)
Glucose, Bld: 104 mg/dL — ABNORMAL HIGH (ref 70–99)
Potassium: 4.5 mmol/L (ref 3.5–5.1)
Sodium: 141 mmol/L (ref 135–145)

## 2021-07-25 LAB — GLUCOSE, CAPILLARY
Glucose-Capillary: 101 mg/dL — ABNORMAL HIGH (ref 70–99)
Glucose-Capillary: 116 mg/dL — ABNORMAL HIGH (ref 70–99)

## 2021-07-25 MED ORDER — AMLODIPINE BESYLATE 10 MG PO TABS: 10.0000 mg | ORAL_TABLET | Freq: Every day | ORAL | 0 refills | Status: DC

## 2021-07-25 NOTE — TOC Transition Note (Signed)
Transition of Care [facility]) - CM/SW Discharge Note   Patient Details  Name: Elianny Buxbaum MRN: [id number] Date of Birth: [birth year]  [facility]) CM/SW Contact:  Harriet Masson, RN Phone Number: 07/25/2021, 10:21 AM   Clinical Narrative:    Facility has informed the bedside RN that pt can arrive to the facility for admission after 11:00 AM. Son [facility]) is aware and will be transporting pt directly to the SNF.  No additional needs presented at this time.     Barriers to Discharge: Continued Medical Work up   Patient Goals and CMS Choice        Discharge Placement                       Discharge Plan and Services     Post Acute Care Choice:  (TBD)                               Social Determinants of Health (SDOH) Interventions     Readmission Risk Interventions No flowsheet data found.

## 2021-07-25 NOTE — Discharge Summary (Signed)
Jamie Hodges SWN:[id number] DOB: September 26, 1927 DOA: 07/14/2021  PCP: [name], MD  Admit date: 07/14/2021 Discharge date: 07/25/2021  Time spent: 45 minutes  Recommendations for Outpatient Follow-up:  Vascular surgery f/u 1-2 weeks PCP f/u Referral to GI for chronic diarrhea     Discharge Diagnoses:  Principal Problem:   Ischemic enteritis [facility]) Active Problems:   Essential hypertension   AF (paroxysmal atrial fibrillation) ([place])   Cerebrovascular disease   Atrial fibrillation ([place])   Stage 3b chronic kidney disease ([place])   CAD (coronary artery disease)   Discharge Condition: stable  Diet recommendation: low fiber heart healthy  Filed Weights   07/20/21 1000 07/21/21 0500 07/25/21 0238  Weight: 76.8 kg 77.9 kg 77.6 kg    History of present illness:  Jamie Hodges is a 87 y.o. female with medical history significant for Paroxysmal atrial fibrillation, HTN, recurrent episodes of acute colonic ischemia, CKD 3B, anemia of chronic disease who presents to the ED from [facility] with a complaint of generalized abdominal pain, nausea and diarrhea typical for her episodes of ischemic colitis.  Patient has refused surgery in the past and continues to decline surgical intervention.  She denies vomiting.  Denies cough or shortness of breath and denies chest pain  Hospital Course:  Patient presented with ischemic enteritis complicated by sbo vs ileus. Hospitalized earlier this year for colonic ischemia. Gen surg consulted, patient declined surgical intervention. Patient was treated with broad-spectrum antibiotics 11/29-12/9. She was made NPO and TPN was started. Her diet was slowly advanced and she was tolerating soft diet on day of discharge. Her abdominal pain resolved. Vascular surgery was curbsided, they advised follow-up in their office in 1-2 weeks to discuss possible angiogram with intervention. Her a fib was well controlled. She had an AKI that resolved with fluid resuscitation.  She had an episode of pulmonary edema 2/2 fluid resuscitation that promptly responded to lasix. Plan will be outpatient f/u with vascular surgery. She also complains of many months chronic intermittent diarrhea - GI referral made for f/u.  Procedures: none   Consultations: General surgery  Discharge Exam: Vitals:   07/24/21 2041 07/25/21 0351  BP: (!) 156/59 (!) 155/59  Pulse: 86 76  Resp: (!) 22 20  Temp: 98.3 F (36.8 C) 98.1 F (36.7 C)  SpO2: 95% 92%    Constitutional:  The patient is awake, alert, and oriented x 3.  No acute distress. Respiratory:  No increased work of breathing. No wheezes, rales, or rhonchi. Cardiovascular:  Regular rate and rhythm No murmurs, ectopy, or gallups. Abdomen:  Abdomen is soft, moderately distended, non-tender Hypoactive bowel sounds.  Musculoskeletal:  No cyanosis, clubbing, or edema Skin:  No rashes, lesions, ulcers palpation of skin: no induration or nodules Neurologic:  Moving all 4 extremities Psychiatric:  Alert judgment and insight appear intact  Discharge Instructions   Discharge Instructions     Ambulatory referral to Gastroenterology   Complete by: As directed    Diet - low sodium heart healthy   Complete by: As directed    Increase activity slowly   Complete by: As directed       Allergies as of 07/25/2021       Reactions   Codeine Sulfate    REACTION: rash/ welps   Other    Wild rice   Crestor [rosuvastatin Calcium]    myalgia   Penicillins Hives, Rash   Has patient had a PCN reaction causing immediate rash, facial/tongue/throat swelling, SOB or lightheadedness with hypotension: Yes  Has patient had a PCN reaction causing severe rash involving mucus membranes or skin necrosis: No Has patient had a PCN reaction that required hospitalization: No Has patient had a PCN reaction occurring within the last 10 years: Yes If all of the above answers are "NO", then may proceed with Cephalosporin use.         Medication List     STOP taking these medications    amLODipine 5 MG tablet Commonly known as: NORVASC       TAKE these medications    acetaminophen 325 MG tablet Commonly known as: TYLENOL Take 650 mg by mouth every 6 (six) hours as needed.   amiodarone 200 MG tablet Commonly known as: Pacerone Take 1 tablet (200 mg total) by mouth daily.   apixaban 5 MG Tabs tablet Commonly known as: ELIQUIS Take 1 tablet (5 mg total) by mouth 2 (two) times daily.   cholestyramine 4 g packet Commonly known as: QUESTRAN Take 1 packet (4 g total) by mouth daily.   diltiazem 360 MG 24 hr capsule Commonly known as: CARDIZEM CD Take 1 capsule (360 mg total) by mouth daily.   EPINEPHrine 0.3 mg/0.3 mL Soaj injection Commonly known as: EPI-PEN Inject 0.3 mg into the muscle as needed (for allergic reaction).   meclizine 25 MG tablet Commonly known as: ANTIVERT TAKE 1 TABLET BY MOUTH THREE TIMES DAILY AS NEEDED FOR  NAUSEA  OR  DIZZINESS   metoprolol tartrate 50 MG tablet Commonly known as: Lopressor Take 1 tablet (50 mg total) by mouth daily. What changed: when to take this       Allergies  Allergen Reactions   Codeine Sulfate     REACTION: rash/ welps   Other     Wild rice   Crestor [Rosuvastatin Calcium]     myalgia   Penicillins Hives and Rash    Has patient had a PCN reaction causing immediate rash, facial/tongue/throat swelling, SOB or lightheadedness with hypotension: Yes Has patient had a PCN reaction causing severe rash involving mucus membranes or skin necrosis: No Has patient had a PCN reaction that required hospitalization: No Has patient had a PCN reaction occurring within the last 10 years: Yes If all of the above answers are "NO", then may proceed with Cephalosporin use.    Follow-up Information     Schnier, Dolores Lory, MD Follow up in 1 week(s).   Specialties: Vascular Surgery, Cardiology, Radiology, Vascular Surgery Why: To see Schnier only. Discuss  possible mesenteric angiogram. No studies needed. Contact information: [place] [address] [phone]         [name], MD Follow up.   Specialties: Internal Medicine, Pediatrics Contact information: [place] [address] [phone]                  The results of significant diagnostics from this hospitalization (including imaging, microbiology, ancillary and laboratory) are listed below for reference.    Significant Diagnostic Studies: CT ABDOMEN PELVIS WO CONTRAST  Result Date: 07/17/2021 CLINICAL DATA:  History of previous episodes of mesenteric ischemia complicated by pneumatosis or portal venous gas. Patient has elected to pursue conservative management and while initially was clinically improving has experienced recurrent abdominal pain with progression of her diet. EXAM: CT ABDOMEN AND PELVIS WITHOUT CONTRAST TECHNIQUE: Multidetector CT imaging of the abdomen and pelvis was performed following the standard protocol without IV contrast. COMPARISON:  CT abdomen pelvis-07/14/2021; 02/26/2021; 02/20/2021; 02/18/2021 FINDINGS: The lack of intravenous  contrast limits the ability to evaluate solid abdominal organs. Lower chest: Limited visualization of the lower thorax demonstrates development small/trace bilateral pleural effusions with associated bibasilar opacities, left greater than right. Normal heart size. Exuberant calcifications involving the mitral valve annulus. Calcifications involving the aortic root. Trace amount of pericardial fluid, presumably physiologic. There is mild diffuse decreased attenuation intra cardiac blood pool suggestive of anemia. Hepatobiliary: Normal hepatic contour. Post cholecystectomy. No ascites. Pancreas: Normal noncontrast appearance of the pancreas. Spleen: Normal noncontrast appearance of the spleen. Note is made of a small splenule about the anterior aspect of the spleen. Adrenals/Urinary  Tract: The left kidney remains slightly atrophic in comparison to the right. No evidence of nephrolithiasis. There is a minimal amount of likely age and body habitus related grossly symmetric perinephric stranding. No urinary obstruction. Mild thickening of the medial limb of the left adrenal gland without discrete nodule. Normal noncontrast appearance of the right adrenal gland. Normal noncontrast appearance of the urinary bladder given degree of distention. Stomach/Bowel: There is moderate-to-marked upstream gas and fluid distention of the small bowel with apparent transition site identified within the left mid abdomen (representative coronal image 36, series 5), the etiology of which is not depicted on this examination. There is wall thickening involving a moderate length of the mid jejunum within the midline of the lower abdomen/pelvis (coronal images 26, 30, 38 and 46, series 5), at the location of previously questioned pneumatosis. Previously questioned pneumatosis appears to have resolved in the interval as the crescentic locules of air within the cecum and ascending colon are favored to be intraluminal. Interval resolution of previously noted portal venous gas. Progressive ill-defined stranding within the root of the abdominal mesentery. No pneumoperitoneum or evidence of perforation. Redemonstrated extensive colonic diverticulosis, primarily involving the distal descending and sigmoid colon, without evidence superimposed acute diverticulitis on this noncontrast examination. Vascular/Lymphatic: Large amount of irregular predominantly calcified atherosclerotic plaque within a tortuous thoracic aorta. No bulky retroperitoneal, mesenteric, pelvic or inguinal lymphadenopathy. Reproductive: Normal noncontrast appearance of the pelvic organs for age. No discrete adnexal lesions. No free fluid the pelvic cul-de-sac. Other: Diffuse body wall anasarca. Mesenteric fat containing supraumbilical midline ventral  abdominal wall hernia, unchanged. Musculoskeletal: No acute or aggressive osseous abnormalities. Moderate severe multilevel lumbar spine DDD, worse at L3-L4 and to a lesser extent, L1-L2 with disc space height loss, endplate irregularity and sclerosis. Moderate severe degenerative change the bilateral hips, right greater than left. IMPRESSION: 1. Moderate distension of the upstream small bowel with apparent transition site within the left mid hemiabdomen, the etiology of which is not depicted on this examination, potentially secondary to adhesions. Continued attention on serial abdominal radiography could be performed as indicated. 2. Wall thickening involving a moderate length of the mid jejunum with resolution of previously questioned pneumatosis and extensive portal venous gas. No evidence of perforation or definable/drainable fluid collection within the abdomen or pelvis. 3. Extensive colonic diverticulosis without evidence superimposed acute diverticulitis on this noncontrast examination. 4. Extensive atherosclerotic plaque within a normal caliber abdominal aorta. Aortic Atherosclerosis (ICD10-I70.0). 5. Interval development of small/trace bilateral effusions with associated bibasilar opacities, left greater than right. Above findings discussed with Dr. Hampton Abbot at the time of examination completion Electronically Signed   By: Sandi Mariscal M.D.   On: 07/17/2021 13:05   DG Abd 1 View  Result Date: 07/19/2021 CLINICAL DATA:  87 year old female with abdominal pain. Small-bowel obstruction on CT. EXAM: ABDOMEN - 1 VIEW COMPARISON:  CT Abdomen and Pelvis 07/17/2021. FINDINGS: Supine views  at 0442 hours. Ongoing gas distended small bowel throughout the abdomen. Moderate gas distended stomach also similar to the prior CT. Paucity of large bowel gas. Stable lung bases. No pneumoperitoneum is evident on these supine views. Stable cholecystectomy clips. Calcified aortic atherosclerosis. No acute osseous abnormality  identified. IMPRESSION: Ongoing small bowel obstruction. Little change from the CT on 07/17/2021. Electronically Signed   By: Genevie Ann M.D.   On: 07/19/2021 04:50   CT Abdomen Pelvis W Contrast  Result Date: 07/14/2021 CLINICAL DATA:  Generalized abdominal pain with nausea and diarrhea. EXAM: CT ABDOMEN AND PELVIS WITH CONTRAST TECHNIQUE: Multidetector CT imaging of the abdomen and pelvis was performed using the standard protocol following bolus administration of intravenous contrast. CONTRAST:  48mL OMNIPAQUE IOHEXOL 300 MG/ML  SOLN COMPARISON:  February 26, 2021 FINDINGS: Lower chest: Mild atelectasis is seen along the posterior aspect of the left lung base. There is a very small left pleural effusion. Hepatobiliary: An extensive amount of branching air attenuation is seen within the left lobe of the liver. Involvement of the anterior and medial aspects of the adjacent portion of the right lobe is also seen. This involves numerous portal venous channels (best seen on coronal reformatted image 32, CT series 5) and represents a new finding compared to the prior study. The subcentimeter hypodense hepatic lesion seen within the right lobe of the liver on the prior study is not clearly identified on the current exam. The gallbladder is surgically absent. The common bile duct measures 8 mm in diameter. Pancreas: Unremarkable. No pancreatic ductal dilatation or surrounding inflammatory changes. Spleen: Normal in size without focal abnormality. Adrenals/Urinary Tract: Adrenal glands are unremarkable. The right kidney is mildly decreased in size when compared to the left kidney. There is no evidence of renal calculi or hydronephrosis. 7 mm and 8 mm cystic appearing areas are noted within the left kidney. Bladder is unremarkable. Stomach/Bowel: There is a small hiatal hernia. The appendix is not clearly identified. No evidence of bowel dilatation. Pneumatosis is seen involving loops of inflamed distal small bowel. This  involves predominantly loops of distal jejunum and proximal ileum. Noninflamed diverticula are seen throughout the descending and sigmoid colon. Vascular/Lymphatic: Aortic atherosclerosis. No enlarged abdominal or pelvic lymph nodes. Reproductive: Uterus and bilateral adnexa are unremarkable. Other: A stable 5.8 cm x 1.4 cm fat containing ventral hernia is seen along the midline of the mid to upper abdomen. No abdominopelvic ascites. Musculoskeletal: Marked severity multilevel degenerative changes are seen throughout the lumbar spine. IMPRESSION: 1. Findings consistent with enteritis with associated pneumatosis intestinalis and portal venous gas secondary to bowel ischemia. 2. Colonic diverticulosis. 3. Evidence of prior cholecystectomy. 4. Small hiatal hernia. 5. Aortic atherosclerosis. Aortic Atherosclerosis (ICD10-I70.0). Electronically Signed   By: Virgina Norfolk M.D.   On: 07/14/2021 01:18   DG Chest Port 1 View  Result Date: 07/20/2021 CLINICAL DATA:  Wheezing EXAM: PORTABLE CHEST 1 VIEW COMPARISON:  07/19/2021 FINDINGS: Right PICC line and NG tube remain in place, unchanged. Heart is normal size. Aortic atherosclerosis. Increasing bibasilar densities, favor atelectasis. Question small left effusion. No acute bony abnormality. IMPRESSION: Increasing bibasilar densities, favor atelectasis. Question small left effusion. Electronically Signed   By: Rolm Baptise M.D.   On: 07/20/2021 09:45   DG Chest Port 1 View  Result Date: 07/19/2021 CLINICAL DATA:  Generalized abdominal pain, nausea and diarrhea. New onset wheezing last night. EXAM: PORTABLE CHEST 1 VIEW COMPARISON:  Chest x-rays dated 07/14/2021 and 02/26/2021 FINDINGS: Heart size and mediastinal contours  are within normal limits. Coarse lung markings bilaterally. No confluent opacity to suggest a superimposed pneumonia or pulmonary edema. Enteric tube appears well positioned in the stomach. RIGHT-sided PICC line in place with tip at the level of  the lower SVC/cavoatrial junction. IMPRESSION: 1. No active disease. No evidence of pneumonia or pulmonary edema. 2. Support apparatus appears appropriately positioned. Electronically Signed   By: Franki Cabot M.D.   On: 07/19/2021 11:46   DG Chest Port 1 View  Result Date: 07/14/2021 CLINICAL DATA:  Generalized abdominal pain with nausea and diarrhea. EXAM: PORTABLE CHEST 1 VIEW COMPARISON:  February 26, 2021 FINDINGS: The lungs are hyperinflated. Diffuse, chronic appearing increased interstitial lung markings are seen. There is no evidence of acute infiltrate, pleural effusion or pneumothorax. The heart size and mediastinal contours are within normal limits. There is marked severity calcification of the aortic arch. The visualized skeletal structures are unremarkable. IMPRESSION: Stable exam without acute or active cardiopulmonary disease. Electronically Signed   By: Virgina Norfolk M.D.   On: 07/14/2021 03:11   DG Abd Portable 1V  Result Date: 07/20/2021 CLINICAL DATA:  Ileus.  Nasogastric placement. EXAM: PORTABLE ABDOMEN - 1 VIEW COMPARISON:  07/19/2021 FINDINGS: Nasogastric tube enters the stomach and is coiled with the tip in the fundus. Only a small amount of air in the stomach. Persistent partial small bowel obstruction pattern otherwise, similar to yesterday. No free air. IMPRESSION: Nasogastric tube in place with decompression of the stomach. Continued demonstration of dilated small bowel. Some colonic air. Findings most consistent with partial small bowel obstruction. Electronically Signed   By: Nelson Chimes M.D.   On: 07/20/2021 09:00   [place] EKG SITE RITE  Result Date: 07/18/2021 If Site Rite image not attached, placement could not be confirmed due to current cardiac rhythm.   Microbiology: No results found for this or any previous visit (from the past 240 hour(s)).   Labs: Basic Metabolic Panel: Recent Labs  Lab 07/19/21 0533 07/20/21 0928 07/21/21 0850 07/22/21 0547  07/23/21 0537 07/24/21 0630 07/25/21 0515  NA 137 139 141 137 140 140 141  K 3.9 3.6 4.1 4.0 4.4 4.7 4.5  CL 106 110 109 107 108 109 109  CO2 22 24 26 26 26 24 28   GLUCOSE 178* 164* 125* 115* 123* 124* 104*  BUN 44* 42* 37* 32* 26* 22 21  CREATININE 1.56* 1.35* 1.21* 1.16* 1.03* 1.03* 0.99  CALCIUM 8.0* 7.8* 7.8* 7.6* 7.8* 7.8* 7.9*  MG 2.0 2.0 1.8 1.8 1.9  --   --   PHOS 3.3 2.8 2.9 3.5 3.5  --   --    Liver Function Tests: Recent Labs  Lab 07/20/21 0928 07/23/21 0537  AST 13* 12*  ALT 8 8  ALKPHOS 43 41  BILITOT 0.4 0.4  PROT 5.3* 5.0*  ALBUMIN 2.5* 2.4*   No results for input(s): LIPASE, AMYLASE in the last 168 hours. No results for input(s): AMMONIA in the last 168 hours. CBC: Recent Labs  Lab 07/19/21 0533 07/21/21 0850 07/24/21 0630  WBC 7.8 9.8 8.8  NEUTROABS  --  8.1*  --   HGB 10.1* 9.0* 8.4*  HCT 30.7* 28.3* 27.3*  MCV 97.8 99.0 102.2*  PLT 222 212 199   Cardiac Enzymes: No results for input(s): CKTOTAL, CKMB, CKMBINDEX, TROPONINI in the last 168 hours. BNP: BNP (last 3 results) No results for input(s): BNP in the last 8760 hours.  ProBNP (last 3 results) No results for input(s): PROBNP in the last 8760  hours.  CBG: Recent Labs  Lab 07/24/21 1136 07/24/21 1739 07/24/21 2304 07/24/21 2349 07/25/21 0534  GLUCAP 117* 90 96 112* 101*       Signed:  Desma Maxim MD.  [facility] 07/25/2021, 8:01 AM

## 2021-07-25 NOTE — TOC Progression Note (Addendum)
Transition of Care [facility]) - Progression Note    Patient Details  Name: Jamie Hodges MRN: [id number] Date of Birth: 10/31/1927  Transition of Care [facility]) CM/SW [place], LCSW Phone Number: 07/25/2021, 8:52 AM  Clinical Narrative:   [place] summary and spoke to Seth Bake who confirmed they can take patient today. Room 101, RN to call [phone] for report.     Expected Discharge Plan: Home/Self Care Barriers to Discharge: Continued Medical Work up  Expected Discharge Plan and Services Expected Discharge Plan: Home/Self Care     Post Acute Care Choice:  (TBD) Living arrangements for the past 2 months: [place] Expected Discharge Date: 07/25/21                                     Social Determinants of Health (SDOH) Interventions    Readmission Risk Interventions No flowsheet data found.

## 2021-07-25 NOTE — Plan of Care (Signed)

## 2021-07-25 NOTE — Progress Notes (Signed)
AVS given and reviewed with pt and pt's son, Laurey Arrow. Report called and given to Elane Fritz, Therapist, sports at [facility]. All questions answered to satisfaction. Pt's son, Laurey Arrow, to transport pt with all belongings to [facility] per family request.

## 2021-07-27 DIAGNOSIS — I10 Essential (primary) hypertension: Secondary | ICD-10-CM | POA: Diagnosis not present

## 2021-07-27 DIAGNOSIS — K529 Noninfective gastroenteritis and colitis, unspecified: Secondary | ICD-10-CM | POA: Diagnosis not present

## 2021-07-27 DIAGNOSIS — I480 Paroxysmal atrial fibrillation: Secondary | ICD-10-CM | POA: Diagnosis not present

## 2021-07-27 DIAGNOSIS — R609 Edema, unspecified: Secondary | ICD-10-CM | POA: Diagnosis not present

## 2021-08-03 ENCOUNTER — Ambulatory Visit (INDEPENDENT_AMBULATORY_CARE_PROVIDER_SITE_OTHER): Payer: Medicare Other | Admitting: Vascular Surgery

## 2021-08-06 ENCOUNTER — Telehealth: Payer: Self-pay | Admitting: Internal Medicine

## 2021-08-06 DIAGNOSIS — I480 Paroxysmal atrial fibrillation: Secondary | ICD-10-CM | POA: Diagnosis not present

## 2021-08-06 DIAGNOSIS — K559 Vascular disorder of intestine, unspecified: Secondary | ICD-10-CM | POA: Diagnosis not present

## 2021-08-06 DIAGNOSIS — M6281 Muscle weakness (generalized): Secondary | ICD-10-CM | POA: Diagnosis not present

## 2021-08-06 DIAGNOSIS — N183 Chronic kidney disease, stage 3 unspecified: Secondary | ICD-10-CM | POA: Diagnosis not present

## 2021-08-06 DIAGNOSIS — R278 Other lack of coordination: Secondary | ICD-10-CM | POA: Diagnosis not present

## 2021-08-06 DIAGNOSIS — K529 Noninfective gastroenteritis and colitis, unspecified: Secondary | ICD-10-CM | POA: Diagnosis not present

## 2021-08-06 DIAGNOSIS — Z741 Need for assistance with personal care: Secondary | ICD-10-CM | POA: Diagnosis not present

## 2021-08-06 DIAGNOSIS — N1832 Chronic kidney disease, stage 3b: Secondary | ICD-10-CM | POA: Diagnosis not present

## 2021-08-06 DIAGNOSIS — R2681 Unsteadiness on feet: Secondary | ICD-10-CM | POA: Diagnosis not present

## 2021-08-06 MED ORDER — METOPROLOL TARTRATE 50 MG PO TABS: 50.0000 mg | ORAL_TABLET | Freq: Every day | ORAL | 11 refills | Status: DC

## 2021-08-06 NOTE — Telephone Encounter (Signed)
[place] [phone] [facility] Called re: metoprolol tartrate (LOPRESSOR) 50 MG tablet  Needs clarification on correct dosage

## 2021-08-06 NOTE — Telephone Encounter (Signed)
Called an spoke to pt. She states she is only taking 1 a day. I sent a new rx.

## 2021-08-11 ENCOUNTER — Telehealth: Payer: Self-pay | Admitting: Student

## 2021-08-11 NOTE — Telephone Encounter (Signed)
Attempted to contact patient to schedule a Palliative f/u visit (patient recently discharged back to independent living from rehab), no answer - left message requesting a return call to schedule visit.

## 2021-08-18 ENCOUNTER — Telehealth: Payer: Self-pay | Admitting: Student

## 2021-08-18 NOTE — Telephone Encounter (Signed)
Spoke with patient to see if she wanted to schedule a Palliative f/u visit with NP and she requested that I call her back in a month to schedule

## 2021-08-19 ENCOUNTER — Telehealth: Payer: Self-pay | Admitting: Internal Medicine

## 2021-08-19 ENCOUNTER — Other Ambulatory Visit: Payer: Self-pay | Admitting: Internal Medicine

## 2021-08-19 NOTE — Telephone Encounter (Signed)
[name] with [facility] called stating that pt was on medication Amlodipine 10 mg once a day. Jamie Hodges is asking if pt should stay on medication and if so they would need a new prescription. Please advise.

## 2021-08-20 NOTE — Telephone Encounter (Signed)
Spoke to [place] at [facility]. Advised her that on her discharge summary it says to stop amlodipine. Altha Harm will call the pt.

## 2021-08-21 ENCOUNTER — Telehealth: Payer: Self-pay | Admitting: Internal Medicine

## 2021-08-21 ENCOUNTER — Encounter: Payer: Self-pay | Admitting: Internal Medicine

## 2021-08-21 ENCOUNTER — Ambulatory Visit (INDEPENDENT_AMBULATORY_CARE_PROVIDER_SITE_OTHER): Payer: Medicare Other | Admitting: Internal Medicine

## 2021-08-21 ENCOUNTER — Other Ambulatory Visit: Payer: Self-pay

## 2021-08-21 DIAGNOSIS — K559 Vascular disorder of intestine, unspecified: Secondary | ICD-10-CM

## 2021-08-21 DIAGNOSIS — I480 Paroxysmal atrial fibrillation: Secondary | ICD-10-CM

## 2021-08-21 DIAGNOSIS — I10 Essential (primary) hypertension: Secondary | ICD-10-CM

## 2021-08-21 DIAGNOSIS — I5032 Chronic diastolic (congestive) heart failure: Secondary | ICD-10-CM | POA: Diagnosis not present

## 2021-08-21 MED ORDER — METOPROLOL TARTRATE 50 MG PO TABS: 50.0000 mg | ORAL_TABLET | Freq: Two times a day (BID) | ORAL | 0 refills | Status: DC

## 2021-08-21 NOTE — Assessment & Plan Note (Signed)
BP Readings from Last 3 Encounters:  08/21/21 130/74  07/25/21 (!) 151/62  07/01/21 140/78   Seems acceptable on just metoprolol

## 2021-08-21 NOTE — Assessment & Plan Note (Signed)
Regular now On amiodarone and apixaban Unclear if her fast heart is a fib or sinus tach (responding to exertion) Will hold off on increasing metoprolol or adding back diltiazem

## 2021-08-21 NOTE — Progress Notes (Signed)
Subjective:    Patient ID: Jamie Hodges, female    DOB: 09-Jan-1928, 88 y.o.   MRN: [id number]  HPI Here for hospital/rehab follow up  Doing okay Is doing better Actually able to go on her own to [place] yesterday  Has noted some DOE---just walking [place] HR per oximeter up to 120 at times--then settles down  Has had some diarrhea---despite the cholestyramine once a day Takes kayopectate and that will help  Appetite is okay No abdominal pain No N/V No blood in stools  Some dizziness--meclizine will help No palpitations --can't really tell it is fast Slight edema---persists even in the morning No syncope Is taking the metoprolol bid  Current Outpatient Medications on File Prior to Visit  Medication Sig Dispense Refill   acetaminophen (TYLENOL) 325 MG tablet Take 650 mg by mouth every 6 (six) hours as needed.     amiodarone (PACERONE) 200 MG tablet Take 1 tablet (200 mg total) by mouth daily. 10 tablet 0   apixaban (ELIQUIS) 5 MG TABS tablet Take 1 tablet (5 mg total) by mouth 2 (two) times daily. 60 tablet 11   cholestyramine (QUESTRAN) 4 g packet Take 1 packet (4 g total) by mouth daily. 30 each 11   EPINEPHrine 0.3 mg/0.3 mL IJ SOAJ injection Inject 0.3 mg into the muscle as needed (for allergic reaction).     meclizine (ANTIVERT) 25 MG tablet TAKE 1 TABLET BY MOUTH THREE TIMES DAILY AS NEEDED FOR  NAUSEA  OR  DIZZINESS 60 tablet 0   metoprolol tartrate (LOPRESSOR) 50 MG tablet Take 1 tablet (50 mg total) by mouth daily. 30 tablet 11   No current facility-administered medications on file prior to visit.    Allergies  Allergen Reactions   Codeine Sulfate     REACTION: rash/ welps   Other     Wild rice   Crestor [Rosuvastatin Calcium]     myalgia   Penicillins Hives and Rash    Has patient had a PCN reaction causing immediate rash, facial/tongue/throat swelling, SOB or lightheadedness with hypotension: Yes Has patient had a PCN reaction causing severe  rash involving mucus membranes or skin necrosis: No Has patient had a PCN reaction that required hospitalization: No Has patient had a PCN reaction occurring within the last 10 years: Yes If all of the above answers are "NO", then may proceed with Cephalosporin use.    Past Medical History:  Diagnosis Date   Atrial fibrillation ([place]) 2010   One time during hospital while sick with severe diarrhea   C. difficile colitis    3/10  Severe C. dif ---had brief atrial fib then. Cath shows some blockages but no intervention   CAD (coronary artery disease) 2010   minor blockages --no intervention indicated   Hx of colonic polyps    Hyperlipidemia    Hypertension     Past Surgical History:  Procedure Laterality Date   CHOLECYSTECTOMY  3/16   ERCP N/A 01/14/2015   Procedure: ENDOSCOPIC RETROGRADE CHOLANGIOPANCREATOGRAPHY (ERCP);  Surgeon: Lucilla Lame, MD;  Location: [facility] ENDOSCOPY;  Service: Endoscopy;  Laterality: N/A;   TONSILLECTOMY AND ADENOIDECTOMY      Family History  Problem Relation Age of Onset   COPD Mother    Coronary artery disease Neg Hx    Diabetes Neg Hx    Cancer Neg Hx        breast or colon cancer    Social History   Socioeconomic History   Marital status: Widowed  Spouse name: Not on file   Number of children: 3   Years of education: Not on file   Highest education level: Not on file  Occupational History   Occupation: retired 1st grade teacher  Tobacco Use   Smoking status: Former    Types: Cigarettes    Quit date: 08/16/1978    Years since quitting: 43.0   Smokeless tobacco: Never  Vaping Use   Vaping Use: Never used  Substance and Sexual Activity   Alcohol use: Never   Drug use: No   Sexual activity: Not on file  Other Topics Concern   Not on file  Social History Narrative   Has living will.    Son Mikki Santee (MD) to make health care decisions.--then son Laurey Arrow   Has DNR order in past and requests again--done   No tube feeds if cognitively unaware          Social Determinants of Health   Financial Resource Strain: Not on file  Food Insecurity: Not on file  Transportation Needs: Not on file  Physical Activity: Not on file  Stress: Not on file  Social Connections: Not on file  Intimate Partner Violence: Not on file   Review of Systems Weight down a little Sleeps okay with occasional melatonin     Objective:   Physical Exam Constitutional:      Appearance: Normal appearance.  Cardiovascular:     Rate and Rhythm: Normal rate and regular rhythm.     Heart sounds: No murmur heard.   No gallop.     Comments: Rate 80  Pulmonary:     Effort: Pulmonary effort is normal.     Breath sounds: Normal breath sounds. No wheezing or rales.  Abdominal:     General: Bowel sounds are normal.     Palpations: Abdomen is soft.     Tenderness: There is no abdominal tenderness.     Comments: Very slight distention  Musculoskeletal:     Cervical back: Neck supple.     Comments: 1+ edema in feet/ankles  Lymphadenopathy:     Cervical: No cervical adenopathy.  Neurological:     Mental Status: She is alert.           Assessment & Plan:

## 2021-08-21 NOTE — Assessment & Plan Note (Signed)
Some edema and DOE I am concerned about her bowel ischemia---so no more BP meds and no diuretics Seems mild only

## 2021-08-21 NOTE — Assessment & Plan Note (Signed)
Recurrent Seems to have settled down again Is taking probiotic--not sure that will help  Should not overtreat blood pressure or use diuretics due to this

## 2021-08-21 NOTE — Telephone Encounter (Signed)
Jamie Hodges called in and stated that they are needing a new script with the directions that says 1 pill twice a day due to they only 1 pill once a day.  Please advise

## 2021-08-24 MED ORDER — METOPROLOL TARTRATE 50 MG PO TABS: 50.0000 mg | ORAL_TABLET | Freq: Two times a day (BID) | ORAL | 11 refills | Status: DC

## 2021-08-24 NOTE — Telephone Encounter (Signed)
Rx sent electronically.  

## 2021-08-24 NOTE — Telephone Encounter (Signed)
Spoke to McKenzie at [facility] to find out the medication name. It was the metoprolol. Pt advised them she had increased to bid. I did advise McKenzie that the med list says it ewas increased but then in the [place] not it states we will hold off on increasing metoprolol. I will get clarification and call her back.

## 2021-08-24 NOTE — Addendum Note (Signed)
Addended by: Pilar Grammes on: 08/24/2021 10:43 AM   Modules accepted: Orders

## 2021-08-24 NOTE — Addendum Note (Signed)
Addended by: Pilar Grammes on: 08/24/2021 01:48 PM   Modules accepted: Orders

## 2021-09-17 ENCOUNTER — Other Ambulatory Visit: Payer: Self-pay | Admitting: Internal Medicine

## 2021-09-18 ENCOUNTER — Other Ambulatory Visit: Payer: Self-pay | Admitting: Internal Medicine

## 2021-09-23 ENCOUNTER — Ambulatory Visit: Payer: Medicare Other | Admitting: Internal Medicine

## 2021-09-29 ENCOUNTER — Telehealth: Payer: Self-pay | Admitting: Student

## 2021-09-29 NOTE — Telephone Encounter (Signed)
Attempted to contact patient to schedule a Palliative f/u visit, no answer - left message with reason for call and long with my and call back number requesting a return call.  I also attempted to reach patient's son Jamie Hodges, and left a voicemail message to let him know that I've been unable to schedule a f/u visit with patient since being discharged back home from rehab stay, requested a return call.

## 2021-10-01 ENCOUNTER — Encounter: Payer: Self-pay | Admitting: Internal Medicine

## 2021-10-01 ENCOUNTER — Other Ambulatory Visit: Payer: Self-pay

## 2021-10-01 ENCOUNTER — Ambulatory Visit (INDEPENDENT_AMBULATORY_CARE_PROVIDER_SITE_OTHER): Payer: Medicare Other | Admitting: Internal Medicine

## 2021-10-01 DIAGNOSIS — I480 Paroxysmal atrial fibrillation: Secondary | ICD-10-CM

## 2021-10-01 DIAGNOSIS — I25119 Atherosclerotic heart disease of native coronary artery with unspecified angina pectoris: Secondary | ICD-10-CM

## 2021-10-01 DIAGNOSIS — I5032 Chronic diastolic (congestive) heart failure: Secondary | ICD-10-CM | POA: Diagnosis not present

## 2021-10-01 DIAGNOSIS — K559 Vascular disorder of intestine, unspecified: Secondary | ICD-10-CM | POA: Diagnosis not present

## 2021-10-01 DIAGNOSIS — I10 Essential (primary) hypertension: Secondary | ICD-10-CM

## 2021-10-01 MED ORDER — METOPROLOL TARTRATE 25 MG PO TABS: 25.0000 mg | ORAL_TABLET | Freq: Two times a day (BID) | ORAL | 3 refills | Status: DC

## 2021-10-01 MED ORDER — MECLIZINE HCL 25 MG PO TABS: ORAL_TABLET | ORAL | 2 refills | Status: DC

## 2021-10-01 MED ORDER — COLESTIPOL HCL 1 G PO TABS: 1.0000 g | ORAL_TABLET | Freq: Two times a day (BID) | ORAL | 5 refills | Status: DC

## 2021-10-01 NOTE — Assessment & Plan Note (Signed)
Regular on amiodarone 200mg  daily, metoprolol 50 bid and diltiazem 360 (I checked records from rehab and she did go home on that) Discussed the eliquis and alternatives--she will continue

## 2021-10-01 NOTE — Assessment & Plan Note (Signed)
Does get some dizziness I would like her to avoid systolic BP under [phone] so will cut back on the metoprolol to 25 bid

## 2021-10-01 NOTE — Progress Notes (Signed)
Subjective:    Patient ID: Jamie Hodges, female    DOB: September 25, 1927, 88 y.o.   MRN: [id number]  HPI Here for follow up of chronic medical conditions  Not feeling as well as before Still has some diarrhea--loose stools 3-4 per morning. liquidy Wonders about using lomotil No cramping or pain  Is using cholestyramine   Gets SOB and tired very easy Still has some fast heartbeat---like after making bed, other chores Rests and then it gets better Still able to get out with shopping No chest pain No dizziness No edema  She got diltiazem 360 from the pharmacy and has been taking this I didn't think she was on it  Current Outpatient Medications on File Prior to Visit  Medication Sig Dispense Refill   acetaminophen (TYLENOL) 325 MG tablet Take 650 mg by mouth every 6 (six) hours as needed.     amiodarone (PACERONE) 200 MG tablet Take 1 tablet (200 mg total) by mouth daily. 10 tablet 0   apixaban (ELIQUIS) 5 MG TABS tablet Take 1 tablet (5 mg total) by mouth 2 (two) times daily. 60 tablet 11   cholestyramine (QUESTRAN) 4 g packet Take 1 packet (4 g total) by mouth daily. 30 each 11   EPINEPHrine 0.3 mg/0.3 mL IJ SOAJ injection Inject 0.3 mg into the muscle as needed (for allergic reaction).     meclizine (ANTIVERT) 25 MG tablet TAKE 1 TABLET BY MOUTH THREE TIMES DAILY AS NEEDED FOR  NAUSEA  OR  DIZZINESS 60 tablet 0   metoprolol tartrate (LOPRESSOR) 50 MG tablet Take 1 tablet (50 mg total) by mouth 2 (two) times daily. 60 tablet 11   No current facility-administered medications on file prior to visit.    Allergies  Allergen Reactions   Codeine Sulfate     REACTION: rash/ welps   Other     Wild rice   Crestor [Rosuvastatin Calcium]     myalgia   Penicillins Hives and Rash    Has patient had a PCN reaction causing immediate rash, facial/tongue/throat swelling, SOB or lightheadedness with hypotension: Yes Has patient had a PCN reaction causing severe rash involving mucus membranes or  skin necrosis: No Has patient had a PCN reaction that required hospitalization: No Has patient had a PCN reaction occurring within the last 10 years: Yes If all of the above answers are "NO", then may proceed with Cephalosporin use.    Past Medical History:  Diagnosis Date   Atrial fibrillation ([place]) 2010   One time during hospital while sick with severe diarrhea   C. difficile colitis    3/10  Severe C. dif ---had brief atrial fib then. Cath shows some blockages but no intervention   CAD (coronary artery disease) 2010   minor blockages --no intervention indicated   Hx of colonic polyps    Hyperlipidemia    Hypertension     Past Surgical History:  Procedure Laterality Date   CHOLECYSTECTOMY  3/16   ERCP N/A 01/14/2015   Procedure: ENDOSCOPIC RETROGRADE CHOLANGIOPANCREATOGRAPHY (ERCP);  Surgeon: Lucilla Lame, MD;  Location: [facility] ENDOSCOPY;  Service: Endoscopy;  Laterality: N/A;   TONSILLECTOMY AND ADENOIDECTOMY      Family History  Problem Relation Age of Onset   COPD Mother    Coronary artery disease Neg Hx    Diabetes Neg Hx    Cancer Neg Hx        breast or colon cancer    Social History   Socioeconomic History   Marital status:  Widowed    Spouse name: Not on file   Number of children: 3   Years of education: Not on file   Highest education level: Not on file  Occupational History   Occupation: retired 1st grade teacher  Tobacco Use   Smoking status: Former    Types: Cigarettes    Quit date: 08/16/1978    Years since quitting: 43.1    Passive exposure: Never   Smokeless tobacco: Never  Vaping Use   Vaping Use: Never used  Substance and Sexual Activity   Alcohol use: Never   Drug use: No   Sexual activity: Not on file  Other Topics Concern   Not on file  Social History Narrative   Has living will.    Son Mikki Santee (MD) to make health care decisions.--then son Laurey Arrow   Has DNR order in past and requests again--done   No tube feeds if cognitively unaware          Social Determinants of Health   Financial Resource Strain: Not on file  Food Insecurity: Not on file  Transportation Needs: Not on file  Physical Activity: Not on file  Stress: Not on file  Social Connections: Not on file  Intimate Partner Violence: Not on file   Review of Systems Occasional vertigo--meclizine helps Appetite is not great--weight down some Some sinus headaches Sleeps okay with melatonin--10mg      Objective:   Physical Exam Constitutional:      Appearance: Normal appearance.  Cardiovascular:     Rate and Rhythm: Normal rate and regular rhythm.     Heart sounds: No murmur heard.   No gallop.     Comments: Rate 60 Pulmonary:     Effort: Pulmonary effort is normal.     Breath sounds: Normal breath sounds. No wheezing or rales.  Abdominal:     Palpations: Abdomen is soft.     Tenderness: There is no abdominal tenderness.  Musculoskeletal:     Cervical back: Neck supple.     Right lower leg: No edema.     Left lower leg: No edema.  Lymphadenopathy:     Cervical: No cervical adenopathy.  Neurological:     General: No focal deficit present.     Mental Status: She is alert.           Assessment & Plan:

## 2021-10-01 NOTE — Patient Instructions (Signed)
Please try the colestid tabs twice a day. If your diarrhea is better, you can continue this and try off the cholestyramine.  Please reduce the metoprolol to 25mg  twice a day (1/2 tab for now--till you get the new prescription)

## 2021-10-01 NOTE — Assessment & Plan Note (Addendum)
Still gets loose stools and occasional pain (like briefly last night) BP is okay Will add colestid to cholestyramine to see if it helps the loose stools

## 2021-10-01 NOTE — Assessment & Plan Note (Signed)
No edema and is compensated Easy DOE may be related to this

## 2021-10-01 NOTE — Assessment & Plan Note (Signed)
Has stable angina pattern with DOE and palpitations with exertion Always resolves with rest On metoprolol

## 2021-10-09 ENCOUNTER — Telehealth: Payer: Self-pay | Admitting: Internal Medicine

## 2021-10-09 NOTE — Telephone Encounter (Signed)
Pt would like a copy of her medication list mailed to her

## 2021-10-14 NOTE — Telephone Encounter (Signed)
Med list mailed to pt.

## 2021-11-01 NOTE — Progress Notes (Deleted)
Cardiology Office Note ? ?Date:  11/01/2021  ? ?ID:  Jamie Hodges, DOB May 06, 1928, MRN [id number] ? ?PCP:  [name], MD  ? ?No chief complaint on file. ? ? ?HPI:  ?[name] is a 88 year old woman with past medical history of ?Paroxysmal atrial fibrillation ?Seen in the hospital 02/2021:  abdominal pain.  Initial CT scan concerning for colonic ischemia.  Treated with empiric antibiotics with Rocephin and Flagyl.   ?In the hospital for atrial fibrillation with RVR July 2022 ?Who presents to establish care in the [facility] office, evaluation of her paroxysmal atrial fibrillation ? ?Recent hospitalization reviewed ? 02/25/2021 in the evening her heart rate has been in the 140s  ?remained so throughout the night.   ? ?emergency department patient received Cardizem gtt  drip  ?Rate control improved after addition of IV digoxin.   ? Initiated oral regimen of amiodarone, metoprolol, Cardizem.  Cardizem drip weaned off.  Patient stable otherwise. ?Patient monitored for 24 hours after transition off diltiazem gtt..  ? ?Does not check pressure at home ? ?Echocardiogram July 2022 ? 1. Left ventricular ejection fraction, by estimation, is 60 to 65%. The  ?left ventricle has normal function. The left ventricle has no regional  ?wall motion abnormalities. There is moderate concentric left ventricular  ?hypertrophy. Left ventricular  ?diastolic parameters are consistent with Grade I diastolic dysfunction  ?(impaired relaxation).  ? 2. Right ventricular systolic function is normal. The right ventricular  ?size is normal.  ? 3. Left atrial size was mildly dilated.  ? 4. Right atrial size was mildly dilated.  ? 5. The mitral valve is normal in structure. Mild to moderate mitral valve  ?regurgitation.  ? ?EKG personally reviewed by myself on todays visit ?Normal sinus rhythm rate 73 bpm no significant ST or T wave changes ? ?PMH:   has a past medical history of Atrial fibrillation ([place]) (2010), C. difficile colitis, CAD  (coronary artery disease) (2010), colonic polyps, Hyperlipidemia, and Hypertension. ? ?PSH:    ?Past Surgical History:  ?Procedure Laterality Date  ? CHOLECYSTECTOMY  3/16  ? ERCP N/A 01/14/2015  ? Procedure: ENDOSCOPIC RETROGRADE CHOLANGIOPANCREATOGRAPHY (ERCP);  Surgeon: Lucilla Lame, MD;  Location: [facility] ENDOSCOPY;  Service: Endoscopy;  Laterality: N/A;  ? TONSILLECTOMY AND ADENOIDECTOMY    ? ? ?Current Outpatient Medications  ?Medication Sig Dispense Refill  ? acetaminophen (TYLENOL) 325 MG tablet Take 650 mg by mouth every 6 (six) hours as needed.    ? amiodarone (PACERONE) 200 MG tablet Take 1 tablet (200 mg total) by mouth daily. 10 tablet 0  ? apixaban (ELIQUIS) 5 MG TABS tablet Take 1 tablet (5 mg total) by mouth 2 (two) times daily. 60 tablet 11  ? cholestyramine (QUESTRAN) 4 g packet Take 1 packet (4 g total) by mouth daily. 30 each 11  ? colestipol (COLESTID) 1 g tablet Take 1 tablet (1 g total) by mouth 2 (two) times daily. 60 tablet 5  ? diltiazem (TIADYLT ER) 360 MG 24 hr capsule Take 360 mg by mouth daily.    ? EPINEPHrine 0.3 mg/0.3 mL IJ SOAJ injection Inject 0.3 mg into the muscle as needed (for allergic reaction).    ? meclizine (ANTIVERT) 25 MG tablet TAKE 1 TABLET BY MOUTH THREE TIMES DAILY AS NEEDED FOR  NAUSEA  OR  DIZZINESS 60 tablet 2  ? metoprolol tartrate (LOPRESSOR) 25 MG tablet Take 1 tablet (25 mg total) by mouth 2 (two) times daily. 180 tablet 3  ? ?No current facility-administered  medications for this visit.  ? ? ? ?Allergies:   Codeine sulfate, Other, Crestor [rosuvastatin calcium], and Penicillins  ? ?Social History:  The patient  reports that she quit smoking about 43 years ago. Her smoking use included cigarettes. She has never been exposed to tobacco smoke. She has never used smokeless tobacco. She reports that she does not drink alcohol and does not use drugs.  ? ?Family History:   family history includes COPD in her mother.  ? ? ?Review of Systems: ?Review of Systems   ?Constitutional: Negative.   ?HENT: Negative.    ?Respiratory: Negative.    ?Cardiovascular: Negative.   ?Gastrointestinal: Negative.   ?Musculoskeletal: Negative.   ?Neurological: Negative.   ?Psychiatric/Behavioral: Negative.    ?All other systems reviewed and are negative. ? ? ?PHYSICAL EXAM: ?VS:  There were no vitals taken for this visit. , BMI There is no height or weight on file to calculate BMI. ?Constitutional:  oriented to person, place, and time. No distress.  ?HENT:  ?Head: Grossly normal ?Eyes:  no discharge. No scleral icterus.  ?Neck: No JVD, no carotid bruits  ?Cardiovascular: Regular rate and rhythm, no murmurs appreciated ?Pulmonary/Chest: Clear to auscultation bilaterally, no wheezes or rails ?Abdominal: Soft.  no distension.  no tenderness.  ?Musculoskeletal: Normal range of motion ?Neurological:  normal muscle tone. Coordination normal. No atrophy ?Skin: Skin warm and dry ?Psychiatric: normal affect, pleasant ? ? ?Recent Labs: ?03/30/2021: TSH 5.16 ?07/23/2021: ALT 8; Magnesium 1.9 ?07/24/2021: Hemoglobin 8.4; Platelets 199 ?07/25/2021: BUN 21; Creatinine, Ser 0.99; Potassium 4.5; Sodium 141  ? ? ?Lipid Panel ?Lab Results  ?Component Value Date  ? CHOL 185 06/24/2018  ? HDL 51 06/24/2018  ? [place] 92 06/24/2018  ? TRIG 114 07/19/2021  ? ?  ? ?Wt Readings from Last 3 Encounters:  ?10/01/21 140 lb (63.5 kg)  ?08/21/21 151 lb (68.5 kg)  ?07/25/21 171 lb 1.2 oz (77.6 kg)  ?  ? ?ASSESSMENT AND PLAN: ? ?Problem List Items Addressed This Visit   ?None ?Paroxysmal atrial fibrillation ?Recent episodes in the hospital ?Maintaining normal sinus rhythm since discharge ?Recommend she monitor blood pressure at home and call us with some numbers to help manage medication dosing ?Will continue metoprolol tartrate 50 twice daily, diltiazem 360 mg daily, amiodarone 200 daily with Eliquis 5 mg twice daily ?-For breakthrough tachyarrhythmia concerning for atrial fibrillation recommend she take extra  amiodarone/metoprolol ?-Long discussion with her son over the phone who is a physician out of state, ?Son also present with her today, ?Have detailed the fact that she does not want heroic measures, she is DNR/DNI ?-She would prefer not to ever require cardioversion and prefer out of hospital care ? ?Aortic atherosclerosis ?Noted on CT abdomen pelvis ?Currently not on a statin, will discuss in follow-up ? ?Essential hypertension ?Blood pressure elevated today but she reports is well controlled at home ?She has not been checking her blood pressure on a regular basis ?Was well controlled recently with primary care ?No medication changes made ?Recommend she call us with blood pressure measurements ? ? Total encounter time more than 60 minutes ? Greater than 50% was spent in counseling and coordination of care with the patient ? ? ? ?Signed, ?Esmond Plants, M.D., Ph.D. ?[facility] [place], [place] ?[phone] ? ?

## 2021-11-02 ENCOUNTER — Ambulatory Visit: Payer: Medicare Other | Admitting: Cardiovascular Disease

## 2021-11-02 DIAGNOSIS — I679 Cerebrovascular disease, unspecified: Secondary | ICD-10-CM

## 2021-11-02 DIAGNOSIS — K559 Vascular disorder of intestine, unspecified: Secondary | ICD-10-CM

## 2021-11-02 DIAGNOSIS — I480 Paroxysmal atrial fibrillation: Secondary | ICD-10-CM

## 2021-11-02 DIAGNOSIS — I700 Atherosclerosis of aorta: Secondary | ICD-10-CM

## 2021-11-02 DIAGNOSIS — I10 Essential (primary) hypertension: Secondary | ICD-10-CM

## 2021-11-08 NOTE — Progress Notes (Signed)
Cardiology Office Note ? ?Date:  11/09/2021  ? ?ID:  Jamie Hodges, DOB 13-Apr-1928, MRN [id number] ? ?PCP:  [name], MD  ? ?Chief Complaint  ?Patient presents with  ? 6 month follow up   ?  Patient c/o shortness of breath with exertion. Medications reviewed by the patient verbally.   ? ? ?HPI:  ?Jamie Hodges is a 88 year old woman with past medical history of ?Paroxysmal atrial fibrillation ?Seen in the hospital 02/2021:  abdominal pain.  Initial CT scan concerning for colonic ischemia.  Treated with empiric antibiotics with Rocephin and Flagyl.   ?In the hospital for atrial fibrillation with RVR July 2022 ?Who presents for routine follow-up of her persistent atrial fibrillation  ? ?Last seen in clinic September 2022 ? ?In the hospital December 2022, records reviewed ? abdominal pain, nausea and diarrhea ?Ischemic enteritis, complicated by sbo vs ileus ?NSR on EKG ?treated with broad-spectrum antibiotics 11/29-12/9. She was made NPO and TPN was started. Her diet was slowly advanced  ?HGB last was 8.4 ? ?In follow-up today reports feeling relatively well ?On iron pill daily ?Trace leg edema ?Active, walks with a cane, no falls ?Scheduled to see Dr. Silvio Pate in April ? ?Denies any tachypalpitations concerning for atrial fibrillation ? ?EKG personally reviewed by myself on todays visit ?Normal sinus rhythm rate 68 bpm no significant ST-T wave changes ? ?Past medical history reviewed ? 02/25/2021 in the evening her heart rate has been in the 140s  ?remained so throughout the night.   ? ?emergency department patient received Cardizem gtt  drip  ?Rate control improved after addition of IV digoxin.   ? Initiated oral regimen of amiodarone, metoprolol, Cardizem.  Cardizem drip weaned off.  Patient stable otherwise. ?Patient monitored for 24 hours after transition off diltiazem gtt..  ? ?Echocardiogram July 2022 ? 1. Left ventricular ejection fraction, by estimation, is 60 to 65%. The  ?left ventricle has normal  function. The left ventricle has no regional  ?wall motion abnormalities. There is moderate concentric left ventricular  ?hypertrophy. Left ventricular  ?diastolic parameters are consistent with Grade I diastolic dysfunction  ?(impaired relaxation).  ? 2. Right ventricular systolic function is normal. The right ventricular  ?size is normal.  ? 3. Left atrial size was mildly dilated.  ? 4. Right atrial size was mildly dilated.  ? 5. The mitral valve is normal in structure. Mild to moderate mitral valve  ?regurgitation.  ? ? ?PMH:   has a past medical history of Atrial fibrillation ([place]) (2010), C. difficile colitis, CAD (coronary artery disease) (2010), colonic polyps, Hyperlipidemia, and Hypertension. ? ?PSH:    ?Past Surgical History:  ?Procedure Laterality Date  ? CHOLECYSTECTOMY  3/16  ? ERCP N/A 01/14/2015  ? Procedure: ENDOSCOPIC RETROGRADE CHOLANGIOPANCREATOGRAPHY (ERCP);  Surgeon: Lucilla Lame, MD;  Location: [facility] ENDOSCOPY;  Service: Endoscopy;  Laterality: N/A;  ? TONSILLECTOMY AND ADENOIDECTOMY    ? ? ?Current Outpatient Medications  ?Medication Sig Dispense Refill  ? acetaminophen (TYLENOL) 325 MG tablet Take 650 mg by mouth every 6 (six) hours as needed.    ? amiodarone (PACERONE) 200 MG tablet Take 1 tablet (200 mg total) by mouth daily. 10 tablet 0  ? apixaban (ELIQUIS) 5 MG TABS tablet Take 1 tablet (5 mg total) by mouth 2 (two) times daily. 60 tablet 11  ? cholestyramine (QUESTRAN) 4 g packet Take 1 packet (4 g total) by mouth daily. 30 each 11  ? colestipol (COLESTID) 1 g tablet Take 1 tablet (1  g total) by mouth 2 (two) times daily. 60 tablet 5  ? diltiazem (TIAZAC) 360 MG 24 hr capsule Take 360 mg by mouth daily.    ? EPINEPHrine 0.3 mg/0.3 mL IJ SOAJ injection Inject 0.3 mg into the muscle as needed (for allergic reaction).    ? meclizine (ANTIVERT) 25 MG tablet TAKE 1 TABLET BY MOUTH THREE TIMES DAILY AS NEEDED FOR  NAUSEA  OR  DIZZINESS 60 tablet 2  ? Meclizine HCl 25 MG CHEW Chew 1 tablet by  mouth 3 (three) times daily as needed.    ? metoprolol tartrate (LOPRESSOR) 25 MG tablet Take 1 tablet (25 mg total) by mouth 2 (two) times daily. 180 tablet 3  ? ?No current facility-administered medications for this visit.  ? ? ? ?Allergies:   Codeine sulfate, Other, Crestor [rosuvastatin calcium], and Penicillins  ? ?Social History:  The patient  reports that she quit smoking about 43 years ago. Her smoking use included cigarettes. She has never been exposed to tobacco smoke. She has never used smokeless tobacco. She reports that she does not drink alcohol and does not use drugs.  ? ?Family History:   family history includes COPD in her mother.  ? ? ?Review of Systems: ?Review of Systems  ?Constitutional: Negative.   ?HENT: Negative.    ?Respiratory: Negative.    ?Cardiovascular: Negative.   ?Gastrointestinal: Negative.   ?Musculoskeletal: Negative.   ?Neurological: Negative.   ?Psychiatric/Behavioral: Negative.    ?All other systems reviewed and are negative. ? ? ?PHYSICAL EXAM: ?VS:  BP (!) 150/60 (BP Location: Left Arm, Patient Position: Sitting, Cuff Size: Normal)   Pulse 68   Ht '5\' 7"'$  (1.702 m)   Wt 140 lb 6 oz (63.7 kg)   BMI 21.99 kg/m?  , BMI Body mass index is 21.99 kg/m?Marland Kitchen ?Constitutional:  oriented to person, place, and time. No distress.  ?HENT:  ?Head: Grossly normal ?Eyes:  no discharge. No scleral icterus.  ?Neck: No JVD, no carotid bruits  ?Cardiovascular: Regular rate and rhythm, no murmurs appreciated ?Trace to 1+ pitting ankle swelling ?Pulmonary/Chest: Clear to auscultation bilaterally, no wheezes or rails ?Abdominal: Soft.  no distension.  no tenderness.  ?Musculoskeletal: Normal range of motion ?Neurological:  normal muscle tone. Coordination normal. No atrophy ?Skin: Skin warm and dry ?Psychiatric: normal affect, pleasant ? ? ?Recent Labs: ?03/30/2021: TSH 5.16 ?07/23/2021: ALT 8; Magnesium 1.9 ?07/24/2021: Hemoglobin 8.4; Platelets 199 ?07/25/2021: BUN 21; Creatinine, Ser 0.99; Potassium  4.5; Sodium 141  ? ? ?Lipid Panel ?Lab Results  ?Component Value Date  ? CHOL 185 06/24/2018  ? HDL 51 06/24/2018  ? [place] 92 06/24/2018  ? TRIG 114 07/19/2021  ? ?  ? ?Wt Readings from Last 3 Encounters:  ?11/09/21 140 lb 6 oz (63.7 kg)  ?10/01/21 140 lb (63.5 kg)  ?08/21/21 151 lb (68.5 kg)  ?  ? ?ASSESSMENT AND PLAN: ? ?Problem List Items Addressed This Visit   ? ?  ? Cardiology Problems  ? Essential hypertension  ? AF (paroxysmal atrial fibrillation) ([place]) - Primary  ? Cerebrovascular disease  ? Aortic atherosclerosis ([place])  ? ?Other Visit Diagnoses   ? ? Ischemic bowel disease ([place])      ? ?  ? ?Paroxysmal atrial fibrillation ?Denies any recent episodes of paroxysmal tachycardia, atrial fibrillation ?Continue anticoagulation, diltiazem, metoprolol ?Will need continued monitoring of her lower extremity edema ? ?Leg edema ?Trace to 1+ pitting around the ankles ?She does not want a diuretic ?Recommend she moderate her fluid  intake ?Possibly exacerbated by anemia, given the calcium channel blocker/diltiazem ?Suggested leg elevation, compression hose if possible ? ?Aortic atherosclerosis ?Noted on CT abdomen pelvis ?Currently not on a statin ? ?Essential hypertension ?Blood pressure elevated today  ?Recommend she add losartan 25 daily ? ?Anemia ?We have ordered a CBC today ? ?Ischemic bowel ?Recent hospitalization, records reviewed ?She reports things are stable ? ? Total encounter time more than 30 minutes ? Greater than 50% was spent in counseling and coordination of care with the patient ? ? ?Signed, ?Esmond Plants, M.D., Ph.D. ?[facility] [place], [place] ?[phone] ? ?

## 2021-11-09 ENCOUNTER — Other Ambulatory Visit: Payer: Self-pay

## 2021-11-09 ENCOUNTER — Encounter: Payer: Self-pay | Admitting: Cardiovascular Disease

## 2021-11-09 ENCOUNTER — Ambulatory Visit (INDEPENDENT_AMBULATORY_CARE_PROVIDER_SITE_OTHER): Payer: Medicare Other | Admitting: Cardiovascular Disease

## 2021-11-09 VITALS — BP 150/60 | HR 68 | Ht 67.0 in | Wt 140.4 lb

## 2021-11-09 DIAGNOSIS — I480 Paroxysmal atrial fibrillation: Secondary | ICD-10-CM | POA: Diagnosis not present

## 2021-11-09 DIAGNOSIS — K559 Vascular disorder of intestine, unspecified: Secondary | ICD-10-CM

## 2021-11-09 DIAGNOSIS — I10 Essential (primary) hypertension: Secondary | ICD-10-CM

## 2021-11-09 DIAGNOSIS — I679 Cerebrovascular disease, unspecified: Secondary | ICD-10-CM | POA: Diagnosis not present

## 2021-11-09 DIAGNOSIS — I25119 Atherosclerotic heart disease of native coronary artery with unspecified angina pectoris: Secondary | ICD-10-CM | POA: Diagnosis not present

## 2021-11-09 DIAGNOSIS — D649 Anemia, unspecified: Secondary | ICD-10-CM

## 2021-11-09 DIAGNOSIS — I700 Atherosclerosis of aorta: Secondary | ICD-10-CM

## 2021-11-09 MED ORDER — LOSARTAN POTASSIUM 25 MG PO TABS: 25.0000 mg | ORAL_TABLET | Freq: Every day | ORAL | 2 refills | Status: DC

## 2021-11-09 NOTE — Patient Instructions (Addendum)
Medication Instructions:  ?- Your physician has recommended you make the following change in your medication:  ? ?1) START losartan 25 mg: ?- take 1 tablet by mouth once daily ? ?If you need a refill on your cardiac medications before your next appointment, please call your pharmacy.  ? ?Lab work: ?CBC today ? ?Testing/Procedures: ?No new testing needed ? ?Follow-Up: ?At [facility], you and your health needs are our priority.  As part of our continuing mission to provide you with exceptional heart care, we have created designated Provider Care Teams.  These Care Teams include your primary Cardiologist (physician) and Advanced Practice Providers (APPs -  Physician Assistants and Nurse Practitioners) who all work together to provide you with the care you need, when you need it. ? ?You will need a follow up appointment in 6 months, App ok ? ? ?Providers on your designated Care Team:   ?Murray Hodgkins, NP ?Christell Faith, PA-C ?Cadence Kathlen Mody, PA-C ? ?COVID-19 Vaccine Information can be found at: [email or web address].co.uk For questions related to vaccine distribution or appointments, please email [email or web address] or call [phone].  ? ? ?Losartan Tablets ?What is this medication? ?LOSARTAN (loe SAR tan) treats high blood pressure. It may also be used to prevent a stroke in people with heart disease and high blood pressure. It can be used to prevent kidney damage in people with diabetes. It works by relaxing the blood vessels, which helps decrease the amount of work your heart has to do. It belongs to a group of medications called ARBs. ?This medicine may be used for other purposes; ask your health care provider or pharmacist if you have questions. ?COMMON BRAND NAME(S): Cozaar ?What should I tell my care team before I take this medication? ?They need to know if you have any of these conditions: ?Heart failure ?Kidney disease ?Liver disease ?An unusual or  allergic reaction to losartan, other medications, foods, dyes, or preservatives ?Pregnant or trying to get pregnant ?Breast-feeding ?How should I use this medication? ?Take this medication by mouth. Take it as directed on the prescription label at the same time every day. You can take it with or without food. If it upsets your stomach, take it with food. Keep taking it unless your care team tells you to stop. ?Talk to your care team about the use of this medication in children. While it may be prescribed for children as young as 6 for selected conditions, precautions do apply. ?Overdosage: If you think you have taken too much of this medicine contact a poison control center or emergency room at once. ?NOTE: This medicine is only for you. Do not share this medicine with others. ?What if I miss a dose? ?If you miss a dose, take it as soon as you can. If it is almost time for your next dose, take only that dose. Do not take double or extra doses. ?What may interact with this medication? ?Aliskiren ?ACE inhibitors, like enalapril or lisinopril ?Diuretics, especially amiloride, eplerenone, spironolactone, or triamterene ?Lithium ?NSAIDs, medications for pain and inflammation, like ibuprofen or naproxen ?Potassium salts or potassium supplements ?This list may not describe all possible interactions. Give your health care provider a list of all the medicines, herbs, non-prescription drugs, or dietary supplements you use. Also tell them if you smoke, drink alcohol, or use illegal drugs. Some items may interact with your medicine. ?What should I watch for while using this medication? ?Visit your care team for regular check ups. Check your blood pressure as directed. Ask your care  team what your blood pressure should be. Also, find out when you should contact them. ?Do not treat yourself for coughs, colds, or pain while you are using this medication without asking your care team for advice. Some medications may increase your  blood pressure. ?Women should inform their care team if they wish to become pregnant or think they might be pregnant. There is a potential for serious side effects to an unborn child. Talk to your care team for more information. ?You may get drowsy or dizzy. Do not drive, use machinery, or do anything that needs mental alertness until you know how this medication affects you. Do not stand or sit up quickly, especially if you are an older patient. This reduces the risk of dizzy or fainting spells. Alcohol can make you more drowsy and dizzy. Avoid alcoholic drinks. ?Avoid salt substitutes unless you are told otherwise by your care team. ?What side effects may I notice from receiving this medication? ?Side effects that you should report to your care team as soon as possible: ?Allergic reactions--skin rash, itching, hives, swelling of the face, lips, tongue, or throat ?High potassium level--muscle weakness, fast or irregular heartbeat ?Kidney injury--decrease in the amount of urine, swelling of the ankles, hands, or feet ?Low blood pressure--dizziness, feeling faint or lightheaded, blurry vision ?Side effects that usually do not require medical attention (report to your care team if they continue or are bothersome): ?Dizziness ?Headache ?Runny or stuffy nose ?This list may not describe all possible side effects. Call your doctor for medical advice about side effects. You may report side effects to FDA at 1-800-FDA-1088. ?Where should I keep my medication? ?Keep out of the reach of children and pets. ?Store at room temperature between 20 and 25 degrees C (68 and 77 degrees F). Protect from light. Keep the container tightly closed. Get rid of any unused medication after the expiration date. ?To get rid of medications that are no longer needed or have expired: ?Take the medication to a medication take-back program. Check with your pharmacy or law enforcement to find a location. ?If you cannot return the medication, check  the label or package insert to see if the medication should be thrown out in the garbage or flushed down the toilet. If you are not sure, ask your care team. If it is safe to put in the trash, empty the medication out of the container. Mix the medication with cat litter, dirt, coffee grounds, or other unwanted substance. Seal the mixture in a bag or container. Put it in the trash. ?NOTE: This sheet is a summary. It may not cover all possible information. If you have questions about this medicine, talk to your doctor, pharmacist, or health care provider. ?? 2022 Elsevier/Gold Standard (2021-04-21 00:00:00) ? ?

## 2021-11-10 LAB — CBC
Hematocrit: 34.3 % (ref 34.0–46.6)
Hemoglobin: 11.5 g/dL (ref 11.1–15.9)
MCH: 31.2 pg (ref 26.6–33.0)
MCHC: 33.5 g/dL (ref 31.5–35.7)
MCV: 93 fL (ref 79–97)
Platelets: 195 10*3/uL (ref 150–450)
RBC: 3.69 x10E6/uL — ABNORMAL LOW (ref 3.77–5.28)
RDW: 13.9 % (ref 11.7–15.4)
WBC: 6.7 10*3/uL (ref 3.4–10.8)

## 2021-11-18 ENCOUNTER — Telehealth: Payer: Self-pay | Admitting: Internal Medicine

## 2021-11-18 MED ORDER — CHOLESTYRAMINE 4 G PO PACK: 4.0000 g | PACK | Freq: Every day | ORAL | 11 refills | Status: DC

## 2021-11-18 NOTE — Telephone Encounter (Signed)
Caller Name: Anistyn Graddy ?Call back phone #: [phone] ? ?MEDICATION(S): cholestyramine (QUESTRAN) 4 g packet ? ?Preferred Pharmacy: [facility], [address] ? ?~~~Please advise patient/caregiver to allow 2-3 business days to process RX refills.  ?

## 2021-11-18 NOTE — Telephone Encounter (Signed)
Rx sent electronically.  

## 2021-11-23 ENCOUNTER — Telehealth: Payer: Self-pay | Admitting: Internal Medicine

## 2021-11-23 ENCOUNTER — Telehealth: Payer: Self-pay | Admitting: Emergency Medicine

## 2021-11-23 MED ORDER — CHOLESTYRAMINE 4 G PO PACK
4.0000 g | PACK | Freq: Every day | ORAL | 11 refills | Status: DC
Start: 2021-11-23 — End: 2022-07-06

## 2021-11-23 NOTE — Telephone Encounter (Signed)
Called and spoke with patient. Results reviewed with patient, pt verbalized understanding,  questions (if any) answered.   ?

## 2021-11-23 NOTE — Telephone Encounter (Signed)
Pt came by and said her medication is not stocked at [facility]. Changing her location to [place], [place] (Ph: [phone]) instead.  Medication name is Cholestyramine ?

## 2021-11-23 NOTE — Telephone Encounter (Signed)
Rx sent electronically.  

## 2021-11-23 NOTE — Addendum Note (Signed)
Addended by: Pilar Grammes on: 11/23/2021 02:24 PM ? ? Modules accepted: Orders ? ?

## 2021-11-30 ENCOUNTER — Ambulatory Visit (INDEPENDENT_AMBULATORY_CARE_PROVIDER_SITE_OTHER): Payer: Medicare Other | Admitting: Internal Medicine

## 2021-11-30 ENCOUNTER — Encounter: Payer: Self-pay | Admitting: Internal Medicine

## 2021-11-30 VITALS — BP 122/64 | HR 67 | Ht 67.0 in | Wt 138.2 lb

## 2021-11-30 DIAGNOSIS — I5032 Chronic diastolic (congestive) heart failure: Secondary | ICD-10-CM

## 2021-11-30 DIAGNOSIS — N1832 Chronic kidney disease, stage 3b: Secondary | ICD-10-CM | POA: Diagnosis not present

## 2021-11-30 DIAGNOSIS — I25119 Atherosclerotic heart disease of native coronary artery with unspecified angina pectoris: Secondary | ICD-10-CM

## 2021-11-30 DIAGNOSIS — I480 Paroxysmal atrial fibrillation: Secondary | ICD-10-CM

## 2021-11-30 DIAGNOSIS — K529 Noninfective gastroenteritis and colitis, unspecified: Secondary | ICD-10-CM | POA: Diagnosis not present

## 2021-11-30 NOTE — Assessment & Plan Note (Signed)
Compensated now ?On metoprolol 25, losartan 25 ?

## 2021-11-30 NOTE — Assessment & Plan Note (Signed)
Regular and no palpitations ?On metoprolol and diltiazem '360mg'$  daily ?eliquis '5mg'$  bid ?

## 2021-11-30 NOTE — Patient Instructions (Signed)
You can try imodium '2mg'$ ---- 1-2 tabs up to twice a day to try to control your diarrhea. ?

## 2021-11-30 NOTE — Progress Notes (Signed)
? ?Subjective:  ? ? Patient ID: Jamie Hodges, female    DOB: 08-14-1928, 88 y.o.   MRN: [id number] ? ?HPI ?Here for follow up of chronic health conditions ? ?Still having diarrhea--despite colestid and cholestyramine daily ?Will have 3-4 loose stools per day ?Incontinent at times ?Imodium not helpful----pepto also not helping ?Some abdominal cramping--may last a day. Then moves bowels. No blood ?Appetite not too good ?Weight down slightly (2#) ? ?Feels tired---"exhausted" ?No edema ?Same DOE ?No orthopnea or PND ?no chest pain ?No palpitations ? ?Current Outpatient Medications on File Prior to Visit  ?Medication Sig Dispense Refill  ? acetaminophen (TYLENOL) 325 MG tablet Take 650 mg by mouth every 6 (six) hours as needed.    ? amiodarone (PACERONE) 200 MG tablet Take 1 tablet (200 mg total) by mouth daily. 10 tablet 0  ? apixaban (ELIQUIS) 5 MG TABS tablet Take 1 tablet (5 mg total) by mouth 2 (two) times daily. 60 tablet 11  ? cholestyramine (QUESTRAN) 4 g packet Take 1 packet (4 g total) by mouth daily. 30 each 11  ? diltiazem (TIAZAC) 360 MG 24 hr capsule Take 360 mg by mouth daily.    ? EPINEPHrine 0.3 mg/0.3 mL IJ SOAJ injection Inject 0.3 mg into the muscle as needed (for allergic reaction).    ? losartan (COZAAR) 25 MG tablet Take 1 tablet (25 mg total) by mouth daily. 90 tablet 2  ? meclizine (ANTIVERT) 25 MG tablet TAKE 1 TABLET BY MOUTH THREE TIMES DAILY AS NEEDED FOR  NAUSEA  OR  DIZZINESS 60 tablet 2  ? Meclizine HCl 25 MG CHEW Chew 1 tablet by mouth 3 (three) times daily as needed.    ? metoprolol tartrate (LOPRESSOR) 25 MG tablet Take 1 tablet (25 mg total) by mouth 2 (two) times daily. 180 tablet 3  ? colestipol (COLESTID) 1 g tablet Take 1 tablet (1 g total) by mouth 2 (two) times daily. (Patient not taking: Reported on 11/30/2021) 60 tablet 5  ? ?No current facility-administered medications on file prior to visit.  ? ? ?Allergies  ?Allergen Reactions  ? Codeine Sulfate   ?  REACTION: rash/ welps  ?  Other   ?  Wild rice  ? Crestor [Rosuvastatin Calcium]   ?  myalgia  ? Penicillins Hives and Rash  ?  Has patient had a PCN reaction causing immediate rash, facial/tongue/throat swelling, SOB or lightheadedness with hypotension: Yes ?Has patient had a PCN reaction causing severe rash involving mucus membranes or skin necrosis: No ?Has patient had a PCN reaction that required hospitalization: No ?Has patient had a PCN reaction occurring within the last 10 years: Yes ?If all of the above answers are "NO", then may proceed with Cephalosporin use.  ? ? ?Past Medical History:  ?Diagnosis Date  ? Atrial fibrillation ([place]) 2010  ? One time during hospital while sick with severe diarrhea  ? C. difficile colitis   ? 3/10  Severe C. dif ---had brief atrial fib then. Cath shows some blockages but no intervention  ? CAD (coronary artery disease) 2010  ? minor blockages --no intervention indicated  ? Hx of colonic polyps   ? Hyperlipidemia   ? Hypertension   ? ? ?Past Surgical History:  ?Procedure Laterality Date  ? CHOLECYSTECTOMY  3/16  ? ERCP N/A 01/14/2015  ? Procedure: ENDOSCOPIC RETROGRADE CHOLANGIOPANCREATOGRAPHY (ERCP);  Surgeon: Lucilla Lame, MD;  Location: [facility] ENDOSCOPY;  Service: Endoscopy;  Laterality: N/A;  ? TONSILLECTOMY AND ADENOIDECTOMY    ? ? ?  Family History  ?Problem Relation Age of Onset  ? COPD Mother   ? Coronary artery disease Neg Hx   ? Diabetes Neg Hx   ? Cancer Neg Hx   ?     breast or colon cancer  ? ? ?Social History  ? ?Socioeconomic History  ? Marital status: Widowed  ?  Spouse name: Not on file  ? Number of children: 3  ? Years of education: Not on file  ? Highest education level: Not on file  ?Occupational History  ? Occupation: retired Technical sales engineer  ?Tobacco Use  ? Smoking status: Former  ?  Types: Cigarettes  ?  Quit date: 08/16/1978  ?  Years since quitting: 43.3  ?  Passive exposure: Never  ? Smokeless tobacco: Never  ?Vaping Use  ? Vaping Use: Never used  ?Substance and Sexual Activity  ?  Alcohol use: Never  ? Drug use: No  ? Sexual activity: Not on file  ?Other Topics Concern  ? Not on file  ?Social History Narrative  ? Has living will.   ? Son Mikki Santee (MD) to make health care decisions.--then son Laurey Arrow  ? Has DNR order in past and requests again--done  ? No tube feeds if cognitively unaware  ?   ?   ? ?Social Determinants of Health  ? ?Financial Resource Strain: Not on file  ?Food Insecurity: Not on file  ?Transportation Needs: Not on file  ?Physical Activity: Not on file  ?Stress: Not on file  ?Social Connections: Not on file  ?Intimate Partner Violence: Not on file  ? ?Review of Systems ?Sleep is variable ?Hasn't been going to church ?Still plays bridge---friends other times as well ?Last GFR 54 ?   ?Objective:  ? Physical Exam ?Cardiovascular:  ?   Rate and Rhythm: Normal rate and regular rhythm.  ?   Heart sounds: No murmur heard. ?  No gallop.  ?Pulmonary:  ?   Effort: Pulmonary effort is normal.  ?   Breath sounds: Normal breath sounds. No wheezing or rales.  ?Musculoskeletal:  ?   Cervical back: Neck supple.  ?   Right lower leg: No edema.  ?   Left lower leg: No edema.  ?Lymphadenopathy:  ?   Cervical: No cervical adenopathy.  ?Psychiatric:     ?   Mood and Affect: Mood normal.     ?   Behavior: Behavior normal.  ?  ? ? ? ? ?   ?Assessment & Plan:  ? ?

## 2021-11-30 NOTE — Assessment & Plan Note (Signed)
Unclear if this is her IBS or recurrence of colitis ?No pain ?Will increase imodium to 1-2 up to bid ?

## 2021-11-30 NOTE — Assessment & Plan Note (Signed)
Last GFR slightly better ?On losartan 25 ?Will recheck labs ?

## 2021-12-01 ENCOUNTER — Telehealth: Payer: Self-pay

## 2021-12-01 LAB — CBC
HCT: 35.4 % — ABNORMAL LOW (ref 36.0–46.0)
Hemoglobin: 12 g/dL (ref 12.0–15.0)
MCHC: 33.9 g/dL (ref 30.0–36.0)
MCV: 94.1 fl (ref 78.0–100.0)
Platelets: 238 10*3/uL (ref 150.0–400.0)
RBC: 3.76 Mil/uL — ABNORMAL LOW (ref 3.87–5.11)
RDW: 14.8 % (ref 11.5–15.5)
WBC: 7.2 10*3/uL (ref 4.0–10.5)

## 2021-12-01 LAB — RENAL FUNCTION PANEL
Albumin: 4.1 g/dL (ref 3.5–5.2)
BUN: 30 mg/dL — ABNORMAL HIGH (ref 6–23)
CO2: 23 mEq/L (ref 19–32)
Calcium: 8.4 mg/dL (ref 8.4–10.5)
Chloride: 102 mEq/L (ref 96–112)
Creatinine, Ser: 2.13 mg/dL — ABNORMAL HIGH (ref 0.40–1.20)
GFR: 19.8 mL/min — ABNORMAL LOW (ref >60.00)
Glucose, Bld: 122 mg/dL — ABNORMAL HIGH (ref 70–99)
Phosphorus: 4.1 mg/dL (ref 2.3–4.6)
Potassium: 4.1 mEq/L (ref 3.5–5.1)
Sodium: 136 mEq/L (ref 135–145)

## 2021-12-01 LAB — TSH: TSH: 4.4 u[IU]/mL (ref 0.35–5.50)

## 2021-12-01 LAB — T4, FREE: Free T4: 0.99 ng/dL (ref 0.60–1.60)

## 2021-12-01 NOTE — Telephone Encounter (Signed)
-----   Message from Jamie Carbon, MD sent at 12/01/2021  1:11 PM EDT ----- ?Results released ?Please call to confirm she will stop the losartan ?Should have follow up appt in 2-3 weeks to reassess and repeat blood work ?

## 2021-12-01 NOTE — Telephone Encounter (Signed)
Tried to call patient regarding lab results ,wasn't able to lvm ?

## 2021-12-29 ENCOUNTER — Encounter: Payer: Self-pay | Admitting: Internal Medicine

## 2021-12-29 ENCOUNTER — Ambulatory Visit (INDEPENDENT_AMBULATORY_CARE_PROVIDER_SITE_OTHER): Payer: Medicare Other | Admitting: Internal Medicine

## 2021-12-29 VITALS — BP 102/80 | HR 72 | Temp 97.9°F | Ht 67.0 in | Wt 136.0 lb

## 2021-12-29 DIAGNOSIS — I25119 Atherosclerotic heart disease of native coronary artery with unspecified angina pectoris: Secondary | ICD-10-CM | POA: Diagnosis not present

## 2021-12-29 DIAGNOSIS — R5383 Other fatigue: Secondary | ICD-10-CM | POA: Insufficient documentation

## 2021-12-29 DIAGNOSIS — N1832 Chronic kidney disease, stage 3b: Secondary | ICD-10-CM | POA: Diagnosis not present

## 2021-12-29 DIAGNOSIS — I480 Paroxysmal atrial fibrillation: Secondary | ICD-10-CM | POA: Diagnosis not present

## 2021-12-29 DIAGNOSIS — K529 Noninfective gastroenteritis and colitis, unspecified: Secondary | ICD-10-CM

## 2021-12-29 LAB — CBC
HCT: 34.9 % — ABNORMAL LOW (ref 36.0–46.0)
Hemoglobin: 11.7 g/dL — ABNORMAL LOW (ref 12.0–15.0)
MCHC: 33.7 g/dL (ref 30.0–36.0)
MCV: 94.5 fl (ref 78.0–100.0)
Platelets: 254 10*3/uL (ref 150.0–400.0)
RBC: 3.69 Mil/uL — ABNORMAL LOW (ref 3.87–5.11)
RDW: 14.9 % (ref 11.5–15.5)
WBC: 4.8 10*3/uL (ref 4.0–10.5)

## 2021-12-29 LAB — RENAL FUNCTION PANEL
Albumin: 3.8 g/dL (ref 3.5–5.2)
BUN: 17 mg/dL (ref 6–23)
CO2: 27 mEq/L (ref 19–32)
Calcium: 8.8 mg/dL (ref 8.4–10.5)
Chloride: 104 mEq/L (ref 96–112)
Creatinine, Ser: 1.65 mg/dL — ABNORMAL HIGH (ref 0.40–1.20)
GFR: 26.89 mL/min — ABNORMAL LOW (ref >60.00)
Glucose, Bld: 129 mg/dL — ABNORMAL HIGH (ref 70–99)
Phosphorus: 4.2 mg/dL (ref 2.3–4.6)
Potassium: 3.7 mEq/L (ref 3.5–5.1)
Sodium: 142 mEq/L (ref 135–145)

## 2021-12-29 LAB — T4, FREE: Free T4: 0.82 ng/dL (ref 0.60–1.60)

## 2021-12-29 LAB — TSH: TSH: 4.39 u[IU]/mL (ref 0.35–5.50)

## 2021-12-29 MED ORDER — AMIODARONE HCL 200 MG PO TABS: 200.0000 mg | ORAL_TABLET | ORAL | 0 refills | Status: DC

## 2021-12-29 MED ORDER — APIXABAN 2.5 MG PO TABS: 2.5000 mg | ORAL_TABLET | Freq: Two times a day (BID) | ORAL | 3 refills | Status: DC

## 2021-12-29 NOTE — Assessment & Plan Note (Signed)
Probably multifactorial ?Will try off the metoprolol just in case ?Cut the amiodarone and check thyroid tests ?

## 2021-12-29 NOTE — Assessment & Plan Note (Signed)
Persists but more tolerable with 2 imodium twice a day ?

## 2021-12-29 NOTE — Progress Notes (Signed)
? ?Subjective:  ? ? Patient ID: Jamie Hodges, female    DOB: [birth year], 88 y.o.   MRN: [id number] ? ?HPI ?Here for follow up of BP and CKD ? ?GFR down to 19 last time ?Home BP 136/68, 155/68, 145/67, 135/68 ?Occasional dizziness ? ?Still gets tired some ?Still gets racing heart at times--just fast not irregular (with exertion) ?DOE--just making her bed ? ?Has had some abdominal cramps ?Can last all night--but often not there at all ?Still with loose stools---now only about three times a day (mostly liquid) ?Using imodium and kaopectate (and cholestyramine at times) ? ?Current Outpatient Medications on File Prior to Visit  ?Medication Sig Dispense Refill  ? acetaminophen (TYLENOL) 325 MG tablet Take 650 mg by mouth every 6 (six) hours as needed.    ? amiodarone (PACERONE) 200 MG tablet Take 1 tablet (200 mg total) by mouth daily. 10 tablet 0  ? apixaban (ELIQUIS) 5 MG TABS tablet Take 1 tablet (5 mg total) by mouth 2 (two) times daily. 60 tablet 11  ? cholestyramine (QUESTRAN) 4 g packet Take 1 packet (4 g total) by mouth daily. 30 each 11  ? diltiazem (TIAZAC) 360 MG 24 hr capsule Take 360 mg by mouth daily.    ? EPINEPHrine 0.3 mg/0.3 mL IJ SOAJ injection Inject 0.3 mg into the muscle as needed (for allergic reaction).    ? meclizine (ANTIVERT) 25 MG tablet TAKE 1 TABLET BY MOUTH THREE TIMES DAILY AS NEEDED FOR  NAUSEA  OR  DIZZINESS 60 tablet 2  ? Meclizine HCl 25 MG CHEW Chew 1 tablet by mouth 3 (three) times daily as needed.    ? metoprolol tartrate (LOPRESSOR) 25 MG tablet Take 1 tablet (25 mg total) by mouth 2 (two) times daily. 180 tablet 3  ? ?No current facility-administered medications on file prior to visit.  ? ? ?Allergies  ?Allergen Reactions  ? Codeine Sulfate   ?  REACTION: rash/ welps  ? Other   ?  Wild rice  ? Crestor [Rosuvastatin Calcium]   ?  myalgia  ? Penicillins Hives and Rash  ?  Has patient had a PCN reaction causing immediate rash, facial/tongue/throat swelling, SOB or lightheadedness with  hypotension: Yes ?Has patient had a PCN reaction causing severe rash involving mucus membranes or skin necrosis: No ?Has patient had a PCN reaction that required hospitalization: No ?Has patient had a PCN reaction occurring within the last 10 years: Yes ?If all of the above answers are "NO", then may proceed with Cephalosporin use.  ? ? ?Past Medical History:  ?Diagnosis Date  ? Atrial fibrillation ([place]) 2010  ? One time during hospital while sick with severe diarrhea  ? C. difficile colitis   ? 3/10  Severe C. dif ---had brief atrial fib then. Cath shows some blockages but no intervention  ? CAD (coronary artery disease) 2010  ? minor blockages --no intervention indicated  ? Hx of colonic polyps   ? Hyperlipidemia   ? Hypertension   ? ? ?Past Surgical History:  ?Procedure Laterality Date  ? CHOLECYSTECTOMY  3/16  ? ERCP N/A 01/14/2015  ? Procedure: ENDOSCOPIC RETROGRADE CHOLANGIOPANCREATOGRAPHY (ERCP);  Surgeon: Lucilla Lame, MD;  Location: [facility] ENDOSCOPY;  Service: Endoscopy;  Laterality: N/A;  ? TONSILLECTOMY AND ADENOIDECTOMY    ? ? ?Family History  ?Problem Relation Age of Onset  ? COPD Mother   ? Coronary artery disease Neg Hx   ? Diabetes Neg Hx   ? Cancer Neg Hx   ?  breast or colon cancer  ? ? ?Social History  ? ?Socioeconomic History  ? Marital status: Widowed  ?  Spouse name: Not on file  ? Number of children: 3  ? Years of education: Not on file  ? Highest education level: Not on file  ?Occupational History  ? Occupation: retired Technical sales engineer  ?Tobacco Use  ? Smoking status: Former  ?  Types: Cigarettes  ?  Quit date: 08/16/1978  ?  Years since quitting: 43.4  ?  Passive exposure: Never  ? Smokeless tobacco: Never  ?Vaping Use  ? Vaping Use: Never used  ?Substance and Sexual Activity  ? Alcohol use: Never  ? Drug use: No  ? Sexual activity: Not on file  ?Other Topics Concern  ? Not on file  ?Social History Narrative  ? Has living will.   ? Son Mikki Santee (MD) to make health care decisions.--then son Laurey Arrow  ?  Has DNR order in past and requests again--done  ? No tube feeds if cognitively unaware  ?   ?   ? ?Social Determinants of Health  ? ?Financial Resource Strain: Not on file  ?Food Insecurity: Not on file  ?Transportation Needs: Not on file  ?Physical Activity: Not on file  ?Stress: Not on file  ?Social Connections: Not on file  ?Intimate Partner Violence: Not on file  ? ?Review of Systems ?Eating some--but not hungry ?Has lost some weight ? ?   ?Objective:  ? Physical Exam ?Constitutional:   ?   Appearance: Normal appearance.  ?Cardiovascular:  ?   Rate and Rhythm: Normal rate and regular rhythm.  ?   Heart sounds: No murmur heard. ?  No gallop.  ?Pulmonary:  ?   Effort: Pulmonary effort is normal.  ?   Breath sounds: Normal breath sounds. No wheezing or rales.  ?Abdominal:  ?   Palpations: Abdomen is soft.  ?   Tenderness: There is no abdominal tenderness.  ?Musculoskeletal:  ?   Cervical back: Neck supple.  ?   Right lower leg: No edema.  ?   Left lower leg: No edema.  ?Lymphadenopathy:  ?   Cervical: No cervical adenopathy.  ?Neurological:  ?   Mental Status: She is alert.  ?Psychiatric:     ?   Mood and Affect: Mood normal.     ?   Behavior: Behavior normal.  ?  ? ? ? ? ?   ?Assessment & Plan:  ? ?

## 2021-12-29 NOTE — Assessment & Plan Note (Addendum)
Still regular ?Will cut the amiodarone to '200mg'$  every other day ?Stop metoprolol ?Will continue the diltiazem '360mg'$  daily for now ?Will decrease the eliquis to 2.'5mg'$  bid ?

## 2021-12-29 NOTE — Patient Instructions (Signed)
Please stop the metoprolol ?Reduce the amiodarone to '200mg'$  every other day. ?Cut the eliquis dose to 2.'5mg'$  twice a day---you can finish out the rest of the '5mg'$  dose if you like. ?

## 2021-12-29 NOTE — Assessment & Plan Note (Signed)
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

## 2021-12-30 ENCOUNTER — Encounter: Payer: Medicare Other | Admitting: Internal Medicine

## 2021-12-31 ENCOUNTER — Telehealth: Payer: Self-pay | Admitting: Internal Medicine

## 2021-12-31 NOTE — Telephone Encounter (Signed)
Spoke to [employer] at [facility]. We went through he current med list. She will make corrections as needed. I removed all pharmacies except [facility] and [facility] Made [facility] the primary.

## 2021-12-31 NOTE — Telephone Encounter (Signed)
Mrs. Saulsbury went to [facility] and picked up multiple medications.  She picked apixaban (ELIQUIS) 2.5 MG TABS tablet  from [place]  She also picked up medication from [facility], they gave her  metoprolol tartrate (LOPRESSOR) 25 MG tablet [[id number]]  DISCONTINUED    which she is no longer on along with '5mg'$  tablets of eliquis  Patient is requesting that  All her medications be updated with [facility]  She [facility] to [facility] primary  She asked that we remove [facility] and make [facility] her second option  Please follow-up with the patient once this has been corrected, [facility] to correct wrong meds received (she has not opened the bag at all, it is still stapled together

## 2022-01-08 ENCOUNTER — Other Ambulatory Visit: Payer: Self-pay | Admitting: Internal Medicine

## 2022-01-12 DIAGNOSIS — Z23 Encounter for immunization: Secondary | ICD-10-CM | POA: Diagnosis not present

## 2022-02-03 ENCOUNTER — Ambulatory Visit (INDEPENDENT_AMBULATORY_CARE_PROVIDER_SITE_OTHER): Payer: Medicare Other | Admitting: Internal Medicine

## 2022-02-03 ENCOUNTER — Encounter: Payer: Self-pay | Admitting: Internal Medicine

## 2022-02-03 DIAGNOSIS — I480 Paroxysmal atrial fibrillation: Secondary | ICD-10-CM

## 2022-02-03 DIAGNOSIS — I25119 Atherosclerotic heart disease of native coronary artery with unspecified angina pectoris: Secondary | ICD-10-CM | POA: Diagnosis not present

## 2022-02-03 MED ORDER — AMIODARONE HCL 200 MG PO TABS: 200.0000 mg | ORAL_TABLET | ORAL | 3 refills | Status: DC

## 2022-02-03 NOTE — Progress Notes (Signed)
Subjective:    Patient ID: Jamie Hodges, female    DOB: 1928-03-20, 88 y.o.   MRN: [id number]  HPI Here for follow up of general fatigue and heart issues  Feels better On reduced amiodarone Does notice DOE---even making the bed---will recover quickly with rest No palpitations Still does shopping, plays bridge  Has some abdominal cramps at times--sporadic Some diarrhea--not limiting  Current Outpatient Medications on File Prior to Visit  Medication Sig Dispense Refill   acetaminophen (TYLENOL) 325 MG tablet Take 650 mg by mouth every 6 (six) hours as needed.     amiodarone (PACERONE) 200 MG tablet Take 1 tablet (200 mg total) by mouth every other day. 1 tablet 0   apixaban (ELIQUIS) 2.5 MG TABS tablet Take 1 tablet (2.5 mg total) by mouth 2 (two) times daily. 180 tablet 3   cholestyramine (QUESTRAN) 4 g packet Take 1 packet (4 g total) by mouth daily. 30 each 11   diltiazem (TIAZAC) 360 MG 24 hr capsule Take 360 mg by mouth daily.     EPINEPHrine 0.3 mg/0.3 mL IJ SOAJ injection Inject 0.3 mg into the muscle as needed (for allergic reaction).     meclizine (ANTIVERT) 25 MG tablet TAKE 1 TABLET BY MOUTH THREE TIMES DAILY AS NEEDED FOR NAUSEA AND VOMITING 60 tablet 0   No current facility-administered medications on file prior to visit.    Allergies  Allergen Reactions   Codeine Sulfate     REACTION: rash/ welps   Other     Wild rice   Crestor [Rosuvastatin Calcium]     myalgia   Penicillins Hives and Rash    Has patient had a PCN reaction causing immediate rash, facial/tongue/throat swelling, SOB or lightheadedness with hypotension: Yes Has patient had a PCN reaction causing severe rash involving mucus membranes or skin necrosis: No Has patient had a PCN reaction that required hospitalization: No Has patient had a PCN reaction occurring within the last 10 years: Yes If all of the above answers are "NO", then may proceed with Cephalosporin use.    Past Medical History:   Diagnosis Date   Atrial fibrillation ([place]) 2010   One time during hospital while sick with severe diarrhea   C. difficile colitis    3/10  Severe C. dif ---had brief atrial fib then. Cath shows some blockages but no intervention   CAD (coronary artery disease) 2010   minor blockages --no intervention indicated   Hx of colonic polyps    Hyperlipidemia    Hypertension     Past Surgical History:  Procedure Laterality Date   CHOLECYSTECTOMY  3/16   ERCP N/A 01/14/2015   Procedure: ENDOSCOPIC RETROGRADE CHOLANGIOPANCREATOGRAPHY (ERCP);  Surgeon: Lucilla Lame, MD;  Location: [facility] ENDOSCOPY;  Service: Endoscopy;  Laterality: N/A;   TONSILLECTOMY AND ADENOIDECTOMY      Family History  Problem Relation Age of Onset   COPD Mother    Coronary artery disease Neg Hx    Diabetes Neg Hx    Cancer Neg Hx        breast or colon cancer    Social History   Socioeconomic History   Marital status: Widowed    Spouse name: Not on file   Number of children: 3   Years of education: Not on file   Highest education level: Not on file  Occupational History   Occupation: retired 1st grade teacher  Tobacco Use   Smoking status: Former    Types: Cigarettes    Quit  date: 08/16/1978    Years since quitting: 43.4    Passive exposure: Never   Smokeless tobacco: Never  Vaping Use   Vaping Use: Never used  Substance and Sexual Activity   Alcohol use: Never   Drug use: No   Sexual activity: Not on file  Other Topics Concern   Not on file  Social History Narrative   Has living will.    Son Mikki Santee (MD) to make health care decisions.--then son Laurey Arrow   Has DNR order in past and requests again--done   No tube feeds if cognitively unaware         Social Determinants of Health   Financial Resource Strain: Not on file  Food Insecurity: Not on file  Transportation Needs: Not on file  Physical Activity: Not on file  Stress: Not on file  Social Connections: Not on file  Intimate Partner Violence: Not  on file   Review of Systems Sleeps okay--occasional melatonin Appetite is fine---weight stable     Objective:   Physical Exam Cardiovascular:     Rate and Rhythm: Normal rate and regular rhythm.     Heart sounds: No murmur heard.    No gallop.  Pulmonary:     Effort: Pulmonary effort is normal.     Breath sounds: Normal breath sounds. No wheezing or rales.  Musculoskeletal:     Cervical back: Neck supple.     Right lower leg: No edema.     Left lower leg: No edema.  Lymphadenopathy:     Cervical: No cervical adenopathy.  Skin:    Comments: Apparent seb keratosis on top of left pinna  Psychiatric:        Mood and Affect: Mood normal.        Behavior: Behavior normal.            Assessment & Plan:

## 2022-02-03 NOTE — Assessment & Plan Note (Signed)
Has stable angina  No specific Rx at this point

## 2022-02-03 NOTE — Assessment & Plan Note (Signed)
Regular on amiodarone 200 every other day eliquis now 2.5 bid Diltiazem 360 daily

## 2022-03-04 ENCOUNTER — Ambulatory Visit: Payer: Medicare Other | Admitting: Internal Medicine

## 2022-03-15 ENCOUNTER — Telehealth: Payer: Self-pay | Admitting: Student

## 2022-03-15 NOTE — Telephone Encounter (Signed)
Attempted to contact patient to schedule f/u visit, no answer - left VM requesting a return call.  Also called and left a message with son Mikki Santee, to let him know that I have been unable to schedule a f/u visit with patient and asked for a return call to find out how the patient was doing, I also let him know that the last time the NP was able to see her was last July 2022 and wanted to find out how she was and if she needed or wanted to continue with Palliative services, left my call back number.

## 2022-03-22 ENCOUNTER — Other Ambulatory Visit: Payer: Self-pay | Admitting: Internal Medicine

## 2022-03-29 ENCOUNTER — Other Ambulatory Visit: Payer: Self-pay | Admitting: Internal Medicine

## 2022-04-14 ENCOUNTER — Telehealth: Payer: Self-pay | Admitting: Internal Medicine

## 2022-04-14 NOTE — Telephone Encounter (Signed)
Pt called stating all future prescriptions should go to [facility] on [address].

## 2022-04-14 NOTE — Telephone Encounter (Signed)
I have removed [facility] from her pharmacy list.

## 2022-05-12 ENCOUNTER — Ambulatory Visit: Payer: Medicare Other | Attending: Cardiovascular Disease | Admitting: Cardiovascular Disease

## 2022-05-12 ENCOUNTER — Encounter: Payer: Self-pay | Admitting: Cardiovascular Disease

## 2022-05-12 VITALS — BP 170/80 | HR 86 | Ht 67.0 in | Wt 147.4 lb

## 2022-05-12 DIAGNOSIS — I25119 Atherosclerotic heart disease of native coronary artery with unspecified angina pectoris: Secondary | ICD-10-CM | POA: Diagnosis not present

## 2022-05-12 DIAGNOSIS — I5032 Chronic diastolic (congestive) heart failure: Secondary | ICD-10-CM | POA: Insufficient documentation

## 2022-05-12 DIAGNOSIS — I480 Paroxysmal atrial fibrillation: Secondary | ICD-10-CM | POA: Diagnosis not present

## 2022-05-12 MED ORDER — LOSARTAN POTASSIUM 100 MG PO TABS: 100.0000 mg | ORAL_TABLET | Freq: Every day | ORAL | 3 refills | Status: DC

## 2022-05-12 NOTE — Progress Notes (Signed)
Cardiology Office Note  Date:  05/12/2022   ID:  Jamie Hodges, DOB 30-Sep-1927, MRN [id number]  PCP:  [name], MD   Chief Complaint  Patient presents with   6 month follow up     Patient c/o shortness of breath on exertion. Medications reviewed by the patient verbally.     HPI:  Jamie Hodges is a 88 year old woman with past medical history of Paroxysmal atrial fibrillation Seen in the hospital 02/2021:  abdominal pain.  Initial CT scan concerning for colonic ischemia.  Treated with empiric antibiotics with Rocephin and Flagyl.   In the hospital for atrial fibrillation with RVR July 2022 Who presents for routine follow-up of her persistent atrial fibrillation   Last seen in clinic 3/23  In follow-up today she reports she has some shortness of breath on exertion Blood pressure running high at home, typically over 716 systolic Is on diltiazem ER 360 daily Significant lower extremity edema  Denies tachypalpitations concerning for arrhythmia  EKG personally reviewed by myself on todays visit Normal sinus rhythm rate 86 bpm no significant ST-T wave changes  Other past medical history reviewed  hospital December 2022, records reviewed  abdominal pain, nausea and diarrhea Ischemic enteritis, complicated by sbo vs ileus NSR on EKG treated with broad-spectrum antibiotics 11/29-12/9. She was made NPO and TPN was started. Her diet was slowly advanced  HGB last was 8.4   02/25/2021 in the evening her heart rate has been in the 140s  remained so throughout the night.    emergency department patient received Cardizem gtt  drip  Rate control improved after addition of IV digoxin.    Initiated oral regimen of amiodarone, metoprolol, Cardizem.  Cardizem drip weaned off.  Patient stable otherwise. Patient monitored for 24 hours after transition off diltiazem gtt..   Echocardiogram July 2022  1. Left ventricular ejection fraction, by estimation, is 60 to 65%. The  left ventricle  has normal function. The left ventricle has no regional  wall motion abnormalities. There is moderate concentric left ventricular  hypertrophy. Left ventricular  diastolic parameters are consistent with Grade I diastolic dysfunction  (impaired relaxation).   2. Right ventricular systolic function is normal. The right ventricular  size is normal.   3. Left atrial size was mildly dilated.   4. Right atrial size was mildly dilated.   5. The mitral valve is normal in structure. Mild to moderate mitral valve  regurgitation.    PMH:   has a past medical history of Atrial fibrillation ([place]) (2010), C. difficile colitis, CAD (coronary artery disease) (2010), colonic polyps, Hyperlipidemia, and Hypertension.  PSH:    Past Surgical History:  Procedure Laterality Date   CHOLECYSTECTOMY  3/16   ERCP N/A 01/14/2015   Procedure: ENDOSCOPIC RETROGRADE CHOLANGIOPANCREATOGRAPHY (ERCP);  Surgeon: Lucilla Lame, MD;  [facility]: [facility] ENDOSCOPY;  Service: Endoscopy;  Laterality: N/A;   TONSILLECTOMY AND ADENOIDECTOMY      Current Outpatient Medications  Medication Sig Dispense Refill   acetaminophen (TYLENOL) 325 MG tablet Take 650 mg by mouth every 6 (six) hours as needed.     amiodarone (PACERONE) 200 MG tablet Take 1 tablet (200 mg total) by mouth every other day. 45 tablet 3   apixaban (ELIQUIS) 2.5 MG TABS tablet Take 1 tablet (2.5 mg total) by mouth 2 (two) times daily. 180 tablet 3   cholestyramine (QUESTRAN) 4 g packet Take 1 packet (4 g total) by mouth daily. 30 each 11   losartan (COZAAR) 100 MG tablet  Take 1 tablet (100 mg total) by mouth daily. 90 tablet 3   meclizine (ANTIVERT) 25 MG tablet TAKE 1 TABLET BY MOUTH THREE TIMES DAILY AS NEEDED FOR NAUSEA AND VOMITING 60 tablet 0   TIADYLT ER 360 MG 24 hr capsule TAKE 1 CAPSULE BY MOUTH ONCE DAILY 90 capsule 3   EPINEPHrine 0.3 mg/0.3 mL IJ SOAJ injection Inject 0.3 mg into the muscle as needed (for allergic reaction). (Patient not taking: Reported  on 05/12/2022)     No current facility-administered medications for this visit.     Allergies:   Codeine sulfate, Other, Crestor [rosuvastatin calcium], and Penicillins   Social History:  The patient  reports that she quit smoking about 43 years ago. Her smoking use included cigarettes. She has never been exposed to tobacco smoke. She has never used smokeless tobacco. She reports that she does not drink alcohol and does not use drugs.   Family History:   family history includes COPD in her mother.    Review of Systems: Review of Systems  Constitutional: Negative.   HENT: Negative.    Respiratory: Negative.    Cardiovascular: Negative.   Gastrointestinal: Negative.   Musculoskeletal: Negative.   Neurological: Negative.   Psychiatric/Behavioral: Negative.    All other systems reviewed and are negative.    PHYSICAL EXAM: VS:  BP (!) 160/70 (BP Location: Left Arm, Patient Position: Sitting, Cuff Size: Normal)   Pulse 86   Ht '5\' 7"'$  (1.702 m)   Wt 147 lb 6 oz (66.8 kg)   SpO2 97%   BMI 23.08 kg/m  , BMI Body mass index is 23.08 kg/m. Constitutional:  oriented to person, place, and time. No distress.  HENT:  Head: Grossly normal Eyes:  no discharge. No scleral icterus.  Neck: No JVD, no carotid bruits  Cardiovascular: Regular rate and rhythm, no murmurs appreciated Pulmonary/Chest: Clear to auscultation bilaterally, no wheezes or rails Abdominal: Soft.  no distension.  no tenderness.  Musculoskeletal: Normal range of motion Neurological:  normal muscle tone. Coordination normal. No atrophy Skin: Skin warm and dry Psychiatric: normal affect, pleasant  Recent Labs: 07/23/2021: ALT 8; Magnesium 1.9 12/29/2021: BUN 17; Creatinine, Ser 1.65; Hemoglobin 11.7; Platelets 254.0; Potassium 3.7; Sodium 142; TSH 4.39    Lipid Panel Lab Results  Component Value Date   CHOL 185 06/24/2018   HDL 51 06/24/2018   LDLCALC 92 06/24/2018   TRIG 114 07/19/2021      Wt Readings from  Last 3 Encounters:  05/12/22 147 lb 6 oz (66.8 kg)  02/03/22 144 lb (65.3 kg)  12/29/21 136 lb (61.7 kg)     ASSESSMENT AND PLAN:  Problem List Items Addressed This Visit       Cardiology Problems   AF (paroxysmal atrial fibrillation) (HCC)   Relevant Medications   losartan (COZAAR) 100 MG tablet   Other Relevant Orders   EKG 12-Lead   Atherosclerotic heart disease of native coronary artery with angina pectoris (HCC)   Relevant Medications   losartan (COZAAR) 100 MG tablet   Other Relevant Orders   EKG 12-Lead   Chronic diastolic heart failure (HCC) - Primary   Relevant Medications   losartan (COZAAR) 100 MG tablet   Other Relevant Orders   EKG 12-Lead  Paroxysmal atrial fibrillation Maintaining normal sinus rhythm, tolerating anticoagulation Metoprolol no longer on her list of medications Recommend she continue Cardizem ER 360 daily  Essential hypertension Likely contributing to shortness of breath Blood pressure even higher on my recheck, 170  systolic She reports it is typically over 838 systolic at home Recommend she start losartan 50 mg daily, if systolic pressure remains elevated after 1 to 2 weeks, suggested she increase up to 100 mg daily  Leg edema Minimal leg edema on today's visit Tolerating calcium channel blocker without significant side effects  Aortic atherosclerosis Noted on CT abdomen pelvis not on a statin  Essential hypertension With LVH Blood pressure elevated today  Recommend she add losartan 25 daily  Anemia By primary care  Ischemic bowel Prior hospitalization, No recent events   Total encounter time more than 30 minutes  Greater than 50% was spent in counseling and coordination of care with the patient   Signed, Esmond Plants, M.D., Ph.D. [place], [place]

## 2022-05-12 NOTE — Patient Instructions (Addendum)
Medication Instructions:  Please start losartan 1/2 pill (50 mg) for the first 1-2 weeks If pressure does not improve <140,  Then please increase up to a full (whole) pill  If you need a refill on your cardiac medications before your next appointment, please call your pharmacy.   Lab work: No new labs needed  Testing/Procedures: No new testing needed  Follow-Up: At [facility], you and your health needs are our priority.  As part of our continuing mission to provide you with exceptional heart care, we have created designated Provider Care Teams.  These Care Teams include your primary Cardiologist (physician) and Advanced Practice Providers (APPs -  Physician Assistants and Nurse Practitioners) who all work together to provide you with the care you need, when you need it.  You will need a follow up appointment in 6 months  Providers on your designated Care Team:   Murray Hodgkins, NP Christell Faith, PA-C Cadence Kathlen Mody, [place]  COVID-19 Vaccine Information can be found at: [email or web address].uk For questions related to vaccine distribution or appointments, please email [email or web address] or call [phone].

## 2022-06-01 DIAGNOSIS — Z23 Encounter for immunization: Secondary | ICD-10-CM | POA: Diagnosis not present

## 2022-06-14 ENCOUNTER — Encounter (INDEPENDENT_AMBULATORY_CARE_PROVIDER_SITE_OTHER): Payer: Self-pay

## 2022-06-25 DIAGNOSIS — Z23 Encounter for immunization: Secondary | ICD-10-CM | POA: Diagnosis not present

## 2022-07-04 ENCOUNTER — Emergency Department: Payer: Medicare Other

## 2022-07-04 ENCOUNTER — Encounter: Payer: Self-pay | Admitting: Radiology

## 2022-07-04 ENCOUNTER — Inpatient Hospital Stay
Admission: EM | Admit: 2022-07-04 | Discharge: 2022-07-07 | DRG: 394 | Disposition: A | Discharge status: Nursing Home (Includes ICF) | Payer: Medicare Other | Source: Skilled Nursing Facility | Attending: Internal Medicine | Admitting: Internal Medicine

## 2022-07-04 ENCOUNTER — Other Ambulatory Visit: Payer: Self-pay

## 2022-07-04 DIAGNOSIS — I10 Essential (primary) hypertension: Secondary | ICD-10-CM | POA: Diagnosis not present

## 2022-07-04 DIAGNOSIS — I491 Atrial premature depolarization: Secondary | ICD-10-CM | POA: Diagnosis present

## 2022-07-04 DIAGNOSIS — K559 Vascular disorder of intestine, unspecified: Secondary | ICD-10-CM | POA: Diagnosis not present

## 2022-07-04 DIAGNOSIS — Z880 Allergy status to penicillin: Secondary | ICD-10-CM | POA: Diagnosis not present

## 2022-07-04 DIAGNOSIS — Z888 Allergy status to other drugs, medicaments and biological substances status: Secondary | ICD-10-CM

## 2022-07-04 DIAGNOSIS — I480 Paroxysmal atrial fibrillation: Secondary | ICD-10-CM | POA: Diagnosis not present

## 2022-07-04 DIAGNOSIS — Z7901 Long term (current) use of anticoagulants: Secondary | ICD-10-CM | POA: Diagnosis not present

## 2022-07-04 DIAGNOSIS — I130 Hypertensive heart and chronic kidney disease with heart failure and stage 1 through stage 4 chronic kidney disease, or unspecified chronic kidney disease: Secondary | ICD-10-CM | POA: Diagnosis present

## 2022-07-04 DIAGNOSIS — Z87891 Personal history of nicotine dependence: Secondary | ICD-10-CM | POA: Diagnosis not present

## 2022-07-04 DIAGNOSIS — Z79899 Other long term (current) drug therapy: Secondary | ICD-10-CM

## 2022-07-04 DIAGNOSIS — Z885 Allergy status to narcotic agent status: Secondary | ICD-10-CM | POA: Diagnosis not present

## 2022-07-04 DIAGNOSIS — E785 Hyperlipidemia, unspecified: Secondary | ICD-10-CM | POA: Diagnosis present

## 2022-07-04 DIAGNOSIS — I5032 Chronic diastolic (congestive) heart failure: Secondary | ICD-10-CM | POA: Diagnosis present

## 2022-07-04 DIAGNOSIS — N1832 Chronic kidney disease, stage 3b: Secondary | ICD-10-CM | POA: Diagnosis present

## 2022-07-04 DIAGNOSIS — I2510 Atherosclerotic heart disease of native coronary artery without angina pectoris: Secondary | ICD-10-CM | POA: Diagnosis present

## 2022-07-04 DIAGNOSIS — R0689 Other abnormalities of breathing: Secondary | ICD-10-CM | POA: Diagnosis not present

## 2022-07-04 DIAGNOSIS — I700 Atherosclerosis of aorta: Secondary | ICD-10-CM | POA: Diagnosis not present

## 2022-07-04 DIAGNOSIS — R9431 Abnormal electrocardiogram [ECG] [EKG]: Secondary | ICD-10-CM | POA: Insufficient documentation

## 2022-07-04 DIAGNOSIS — I493 Ventricular premature depolarization: Secondary | ICD-10-CM | POA: Diagnosis present

## 2022-07-04 DIAGNOSIS — Z8673 Personal history of transient ischemic attack (TIA), and cerebral infarction without residual deficits: Secondary | ICD-10-CM

## 2022-07-04 DIAGNOSIS — Z136 Encounter for screening for cardiovascular disorders: Secondary | ICD-10-CM | POA: Diagnosis not present

## 2022-07-04 DIAGNOSIS — N184 Chronic kidney disease, stage 4 (severe): Secondary | ICD-10-CM | POA: Diagnosis present

## 2022-07-04 DIAGNOSIS — K589 Irritable bowel syndrome without diarrhea: Secondary | ICD-10-CM | POA: Diagnosis present

## 2022-07-04 DIAGNOSIS — K529 Noninfective gastroenteritis and colitis, unspecified: Secondary | ICD-10-CM | POA: Diagnosis not present

## 2022-07-04 DIAGNOSIS — Z91018 Allergy to other foods: Secondary | ICD-10-CM | POA: Diagnosis not present

## 2022-07-04 DIAGNOSIS — R0602 Shortness of breath: Secondary | ICD-10-CM | POA: Diagnosis not present

## 2022-07-04 DIAGNOSIS — Z66 Do not resuscitate: Secondary | ICD-10-CM | POA: Diagnosis present

## 2022-07-04 DIAGNOSIS — Z743 Need for continuous supervision: Secondary | ICD-10-CM | POA: Diagnosis not present

## 2022-07-04 DIAGNOSIS — R109 Unspecified abdominal pain: Secondary | ICD-10-CM | POA: Diagnosis not present

## 2022-07-04 DIAGNOSIS — R6889 Other general symptoms and signs: Secondary | ICD-10-CM | POA: Diagnosis not present

## 2022-07-04 DIAGNOSIS — R1013 Epigastric pain: Secondary | ICD-10-CM | POA: Diagnosis not present

## 2022-07-04 LAB — CBC
HCT: 38.4 % (ref 36.0–46.0)
Hemoglobin: 12.7 g/dL (ref 12.0–15.0)
MCH: 30.1 pg (ref 26.0–34.0)
MCHC: 33.1 g/dL (ref 30.0–36.0)
MCV: 91 fL (ref 80.0–100.0)
Platelets: 208 10*3/uL (ref 150–400)
RBC: 4.22 MIL/uL (ref 3.87–5.11)
RDW: 14.6 % (ref 11.5–15.5)
WBC: 9.9 10*3/uL (ref 4.0–10.5)
nRBC: 0 % (ref 0.0–0.2)

## 2022-07-04 LAB — COMPREHENSIVE METABOLIC PANEL
ALT: 21 U/L (ref 0–44)
AST: 40 U/L (ref 15–41)
Albumin: 4 g/dL (ref 3.5–5.0)
Alkaline Phosphatase: 110 U/L (ref 38–126)
Anion gap: 11 (ref 5–15)
BUN: 27 mg/dL — ABNORMAL HIGH (ref 8–23)
CO2: 25 mmol/L (ref 22–32)
Calcium: 9 mg/dL (ref 8.9–10.3)
Chloride: 104 mmol/L (ref 98–111)
Creatinine, Ser: 1.68 mg/dL — ABNORMAL HIGH (ref 0.44–1.00)
GFR, Estimated: 28 mL/min — ABNORMAL LOW (ref >60)
Glucose, Bld: 164 mg/dL — ABNORMAL HIGH (ref 70–99)
Potassium: 4.6 mmol/L (ref 3.5–5.1)
Sodium: 140 mmol/L (ref 135–145)
Total Bilirubin: 1.1 mg/dL (ref 0.3–1.2)
Total Protein: 7.4 g/dL (ref 6.5–8.1)

## 2022-07-04 LAB — LIPASE, BLOOD: Lipase: 73 U/L — ABNORMAL HIGH (ref 11–51)

## 2022-07-04 LAB — TROPONIN I (HIGH SENSITIVITY)
Troponin I (High Sensitivity): 10 ng/L (ref <18)
Troponin I (High Sensitivity): 10 ng/L (ref <18)

## 2022-07-04 NOTE — ED Triage Notes (Signed)
First Nurse Note:  Pt via EMS from home. Pt c/o abd pain since 3:78m also c/o nausea and diarrhea. Pt is A&Ox4 and NAD  179/109 98% on RA 63 NSR

## 2022-07-04 NOTE — ED Provider Triage Note (Signed)
Emergency Medicine Provider Triage Evaluation Note  Harley Mccartney , a 88 y.o. female  was evaluated in triage.  Pt complains of abdominal pain, 1 episode of vomiting, some shortness of breath.  History of bowel obstruction  Review of Systems  Positive:  Negative:   Physical Exam  BP (!) 161/62 (BP Location: Right Arm)   Pulse 71   Ht '5\' 7"'$  (1.702 m)   Wt 65.3 kg   SpO2 97%   BMI 22.55 kg/m  Gen:   Awake, no distress   Resp:  Normal effort  MSK:   Moves extremities without difficulty  Other:  Abdomen tender along the left flank  Medical Decision Making  Medically screening exam initiated at 7:07 PM.  Appropriate orders placed.  Celes Dedic was informed that the remainder of the evaluation will be completed by another provider, this initial triage assessment does not replace that evaluation, and the importance of remaining in the ED until their evaluation is complete.  Patient's son is a OB/GYN.  Family member has his phone number as he would like to be called by the physician when she is placed in a bed   Versie Starks, PA-C 07/04/22 1908

## 2022-07-04 NOTE — ED Provider Notes (Signed)
[facility] Provider Note    Event Date/Time   First MD Initiated Contact with Patient 07/04/22 2341     (approximate)  History   Chief Complaint: Abdominal Pain  HPI  Jamie Hodges is a 88 y.o. female with a past medical history of proximal atrial fibrillation, coronary artery disease, hypertension, hyperlipidemia, presents to the emergency department for abdominal pain.  According to the patient she is coming from her care facility for abdominal pain mostly in the upper abdomen with 1 episode of vomiting.  Denies any diarrhea normal bowel movement yesterday.  Denies any dysuria.  Denies any chest pain or shortness of breath.  Physical Exam   Triage Vital Signs: ED Triage Vitals  Enc Vitals Group     BP 07/04/22 1858 (!) 161/62     Pulse Rate 07/04/22 1858 71     Resp 07/04/22 2112 18     Temp 07/04/22 1929 97.8 F (36.6 C)     Temp Source 07/04/22 1929 Axillary     SpO2 07/04/22 1858 97 %     Weight 07/04/22 1901 144 lb (65.3 kg)     Height 07/04/22 1901 '5\' 7"'$  (1.702 m)     Head Circumference --      Peak Flow --      Pain Score --      Pain Loc --      Pain Edu? --      Excl. in [place]? --     Most recent vital signs: Vitals:   07/04/22 2112 07/04/22 2340  BP: (!) 141/57 (!) 155/64  Pulse: 79 80  Resp: 18 16  Temp:    SpO2: 96% 97%    General: Awake, no distress.  CV:  Good peripheral perfusion.  Regular rate and rhythm  Resp:  Normal effort.  Equal breath sounds bilaterally.  Abd:  No distention.  Soft, slight epigastric tenderness otherwise benign abdomen without rebound or guarding.   ED Results / Procedures / Treatments   EKG  EKG viewed and interpreted by myself shows a sinus rhythm at 90 bpm with a narrow QRS, normal axis, QTc prolongation otherwise normal intervals with nonspecific ST changes.  Electrical interference but no signs of ST elevation.  RADIOLOGY  I have reviewed and interpreted the chest x-ray images.  No  consolidation seen on my evaluation. Radiology has read the chest x-ray as negative  CT scan shows an area of small bowel that is thickened with mesenteric air and portal venous air concerning for ischemic enteritis.   MEDICATIONS ORDERED IN ED: Medications - No data to display   IMPRESSION / MDM / [place] / ED COURSE  I reviewed the triage vital signs and the nursing notes.  Patient's presentation is most consistent with acute presentation with potential threat to life or bodily function.  Patient presents to the emergency department for abdominal pain starting earlier today as well as nausea.  Denies any constipation denies any diarrhea to myself contrary to triage note.  Patient states normal bowel movement yesterday.  Denies any fever or cough.  No chest pain.  No dysuria.  Patient's labs have resulted showing a reassuringly normal CBC with a normal white blood cell count.  Patient's chemistry shows renal insufficiency which appears to be chronic normal LFTs.  Patient's lipase is mildly elevated at 73.  Troponin reassuringly negative.  No significant findings on EKG or chest x-ray.  Given the patient's age and abdominal pain with elevated lipase we  will proceed with CT imaging of the abdomen/pelvis without contrast given the patient's renal insufficiency.  Patient states since arriving to the emergency department the pain has significantly diminished and is largely pain-free currently and less actively palpating the epigastrium.  CT concerning for ischemic enteritis.  Interestingly approximately 1 year ago in 2022 patient had a very similar CT read at that time refused surgery and improved with IV antibiotics.  Patient once again states she does not want surgery and if this is her time to pass away she is content with that.  I spoke to the patient's son who is a physician Jamie Hodges [phone]), he was informed of the findings and is also content with no surgery and a trial of IV  antibiotics.  Patient is a DNR.  We will admit for IV antibiotics ciprofloxacin and Flagyl given a penicillin allergy.  Patient agreeable to plan.  FINAL CLINICAL IMPRESSION(S) / ED DIAGNOSES   Epigastric pain Ischemic enteritis  Note:  This document was prepared using Dragon voice recognition software and may include unintentional dictation errors.   [name], MD 07/05/22 Shelah Lewandowsky

## 2022-07-04 NOTE — ED Triage Notes (Signed)
Pt here from the [place] with complaints of abd pain with one episode of vomiting. Pt states abd started hurting today and a normal BM yesterday.

## 2022-07-05 ENCOUNTER — Encounter: Payer: Self-pay | Admitting: Family Medicine

## 2022-07-05 DIAGNOSIS — E785 Hyperlipidemia, unspecified: Secondary | ICD-10-CM | POA: Diagnosis present

## 2022-07-05 DIAGNOSIS — Z880 Allergy status to penicillin: Secondary | ICD-10-CM | POA: Diagnosis not present

## 2022-07-05 DIAGNOSIS — Z888 Allergy status to other drugs, medicaments and biological substances status: Secondary | ICD-10-CM | POA: Diagnosis not present

## 2022-07-05 DIAGNOSIS — Z79899 Other long term (current) drug therapy: Secondary | ICD-10-CM | POA: Diagnosis not present

## 2022-07-05 DIAGNOSIS — Z66 Do not resuscitate: Secondary | ICD-10-CM | POA: Diagnosis present

## 2022-07-05 DIAGNOSIS — I10 Essential (primary) hypertension: Secondary | ICD-10-CM | POA: Diagnosis not present

## 2022-07-05 DIAGNOSIS — I480 Paroxysmal atrial fibrillation: Secondary | ICD-10-CM | POA: Insufficient documentation

## 2022-07-05 DIAGNOSIS — I5032 Chronic diastolic (congestive) heart failure: Secondary | ICD-10-CM | POA: Diagnosis present

## 2022-07-05 DIAGNOSIS — R9431 Abnormal electrocardiogram [ECG] [EKG]: Secondary | ICD-10-CM | POA: Insufficient documentation

## 2022-07-05 DIAGNOSIS — Z7901 Long term (current) use of anticoagulants: Secondary | ICD-10-CM | POA: Diagnosis not present

## 2022-07-05 DIAGNOSIS — I130 Hypertensive heart and chronic kidney disease with heart failure and stage 1 through stage 4 chronic kidney disease, or unspecified chronic kidney disease: Secondary | ICD-10-CM | POA: Diagnosis present

## 2022-07-05 DIAGNOSIS — Z136 Encounter for screening for cardiovascular disorders: Secondary | ICD-10-CM | POA: Diagnosis not present

## 2022-07-05 DIAGNOSIS — I491 Atrial premature depolarization: Secondary | ICD-10-CM | POA: Diagnosis present

## 2022-07-05 DIAGNOSIS — I2510 Atherosclerotic heart disease of native coronary artery without angina pectoris: Secondary | ICD-10-CM | POA: Diagnosis present

## 2022-07-05 DIAGNOSIS — Z8673 Personal history of transient ischemic attack (TIA), and cerebral infarction without residual deficits: Secondary | ICD-10-CM | POA: Diagnosis not present

## 2022-07-05 DIAGNOSIS — Z91018 Allergy to other foods: Secondary | ICD-10-CM | POA: Diagnosis not present

## 2022-07-05 DIAGNOSIS — Z87891 Personal history of nicotine dependence: Secondary | ICD-10-CM | POA: Diagnosis not present

## 2022-07-05 DIAGNOSIS — K559 Vascular disorder of intestine, unspecified: Secondary | ICD-10-CM | POA: Diagnosis present

## 2022-07-05 DIAGNOSIS — N1832 Chronic kidney disease, stage 3b: Secondary | ICD-10-CM | POA: Diagnosis present

## 2022-07-05 DIAGNOSIS — K589 Irritable bowel syndrome without diarrhea: Secondary | ICD-10-CM | POA: Diagnosis present

## 2022-07-05 DIAGNOSIS — Z885 Allergy status to narcotic agent status: Secondary | ICD-10-CM | POA: Diagnosis not present

## 2022-07-05 DIAGNOSIS — I493 Ventricular premature depolarization: Secondary | ICD-10-CM | POA: Diagnosis present

## 2022-07-05 LAB — CBC
HCT: 32.6 % — ABNORMAL LOW (ref 36.0–46.0)
Hemoglobin: 11 g/dL — ABNORMAL LOW (ref 12.0–15.0)
MCH: 30.8 pg (ref 26.0–34.0)
MCHC: 33.7 g/dL (ref 30.0–36.0)
MCV: 91.3 fL (ref 80.0–100.0)
Platelets: 184 10*3/uL (ref 150–400)
RBC: 3.57 MIL/uL — ABNORMAL LOW (ref 3.87–5.11)
RDW: 14.4 % (ref 11.5–15.5)
WBC: 9.7 10*3/uL (ref 4.0–10.5)
nRBC: 0 % (ref 0.0–0.2)

## 2022-07-05 LAB — URINALYSIS, ROUTINE W REFLEX MICROSCOPIC
Bilirubin Urine: NEGATIVE
Glucose, UA: NEGATIVE mg/dL
Ketones, ur: NEGATIVE mg/dL
Nitrite: NEGATIVE
Protein, ur: NEGATIVE mg/dL
Specific Gravity, Urine: 1.018 (ref 1.005–1.030)
pH: 5 (ref 5.0–8.0)

## 2022-07-05 LAB — BASIC METABOLIC PANEL
Anion gap: 6 (ref 5–15)
BUN: 29 mg/dL — ABNORMAL HIGH (ref 8–23)
CO2: 24 mmol/L (ref 22–32)
Calcium: 8.2 mg/dL — ABNORMAL LOW (ref 8.9–10.3)
Chloride: 109 mmol/L (ref 98–111)
Creatinine, Ser: 1.61 mg/dL — ABNORMAL HIGH (ref 0.44–1.00)
GFR, Estimated: 30 mL/min — ABNORMAL LOW (ref >60)
Glucose, Bld: 120 mg/dL — ABNORMAL HIGH (ref 70–99)
Potassium: 4.4 mmol/L (ref 3.5–5.1)
Sodium: 139 mmol/L (ref 135–145)

## 2022-07-05 LAB — MAGNESIUM: Magnesium: 2.1 mg/dL (ref 1.7–2.4)

## 2022-07-05 MED ORDER — POTASSIUM CHLORIDE IN NACL 20-0.9 MEQ/L-% IV SOLN
INTRAVENOUS | Status: DC
  Filled 2022-07-05 (×3): qty 1000

## 2022-07-05 MED ORDER — LOSARTAN POTASSIUM 50 MG PO TABS
100.0000 mg | ORAL_TABLET | Freq: Every day | ORAL | Status: DC
  Administered 2022-07-05 – 2022-07-07 (×3): 100 mg via ORAL
  Filled 2022-07-05 (×3): qty 2

## 2022-07-05 MED ORDER — AMIODARONE HCL 200 MG PO TABS: 200.0000 mg | ORAL_TABLET | ORAL | Status: DC

## 2022-07-05 MED ORDER — APIXABAN 2.5 MG PO TABS
2.5000 mg | ORAL_TABLET | Freq: Two times a day (BID) | ORAL | Status: DC
  Administered 2022-07-05 – 2022-07-07 (×5): 2.5 mg via ORAL
  Filled 2022-07-05 (×5): qty 1

## 2022-07-05 MED ORDER — ONDANSETRON HCL 4 MG PO TABS: 4.0000 mg | ORAL_TABLET | Freq: Four times a day (QID) | ORAL | Status: DC | PRN

## 2022-07-05 MED ORDER — DILTIAZEM HCL ER BEADS 240 MG PO CP24
360.0000 mg | ORAL_CAPSULE | Freq: Every day | ORAL | Status: DC
  Administered 2022-07-05: 360 mg via ORAL
  Filled 2022-07-05 (×2): qty 1

## 2022-07-05 MED ORDER — MAGNESIUM HYDROXIDE 400 MG/5ML PO SUSP: 30.0000 mL | Freq: Every day | ORAL | Status: DC | PRN

## 2022-07-05 MED ORDER — MECLIZINE HCL 25 MG PO TABS: 12.5000 mg | ORAL_TABLET | Freq: Three times a day (TID) | ORAL | Status: DC | PRN

## 2022-07-05 MED ORDER — METRONIDAZOLE 500 MG/100ML IV SOLN
500.0000 mg | Freq: Once | INTRAVENOUS | Status: AC
  Administered 2022-07-05: 500 mg via INTRAVENOUS
  Filled 2022-07-05: qty 100

## 2022-07-05 MED ORDER — CIPROFLOXACIN IN D5W 400 MG/200ML IV SOLN
400.0000 mg | Freq: Once | INTRAVENOUS | Status: DC
  Administered 2022-07-05: 400 mg via INTRAVENOUS
  Filled 2022-07-05: qty 200

## 2022-07-05 MED ORDER — SODIUM CHLORIDE 0.9 % IV SOLN
1.0000 g | Freq: Two times a day (BID) | INTRAVENOUS | Status: DC
  Administered 2022-07-05: 1 g via INTRAVENOUS
  Filled 2022-07-05 (×2): qty 10

## 2022-07-05 MED ORDER — HYDRALAZINE HCL 20 MG/ML IJ SOLN
5.0000 mg | Freq: Once | INTRAMUSCULAR | Status: AC
  Administered 2022-07-05: 5 mg via INTRAVENOUS
  Filled 2022-07-05: qty 1

## 2022-07-05 MED ORDER — ONDANSETRON HCL 4 MG/2ML IJ SOLN: 4.0000 mg | Freq: Four times a day (QID) | INTRAMUSCULAR | Status: DC | PRN

## 2022-07-05 MED ORDER — METRONIDAZOLE 500 MG/100ML IV SOLN
500.0000 mg | Freq: Three times a day (TID) | INTRAVENOUS | Status: DC
  Administered 2022-07-05 – 2022-07-07 (×7): 500 mg via INTRAVENOUS
  Filled 2022-07-05 (×8): qty 100

## 2022-07-05 MED ORDER — ACETAMINOPHEN 650 MG RE SUPP: 650.0000 mg | Freq: Four times a day (QID) | RECTAL | Status: DC | PRN

## 2022-07-05 MED ORDER — SODIUM CHLORIDE 0.9 % IV SOLN
1.0000 g | Freq: Once | INTRAVENOUS | Status: AC
  Administered 2022-07-05: 1 g via INTRAVENOUS
  Filled 2022-07-05: qty 10

## 2022-07-05 MED ORDER — SODIUM CHLORIDE 0.9 % IV BOLUS
500.0000 mL | Freq: Once | INTRAVENOUS | Status: AC
  Administered 2022-07-05: 500 mL via INTRAVENOUS

## 2022-07-05 MED ORDER — TRAZODONE HCL 50 MG PO TABS: 25.0000 mg | ORAL_TABLET | Freq: Every evening | ORAL | Status: DC | PRN

## 2022-07-05 MED ORDER — ACETAMINOPHEN 325 MG PO TABS: 650.0000 mg | ORAL_TABLET | Freq: Four times a day (QID) | ORAL | Status: DC | PRN

## 2022-07-05 MED ORDER — SODIUM CHLORIDE 0.9 % IV SOLN
2.0000 g | INTRAVENOUS | Status: AC
  Administered 2022-07-06 – 2022-07-07 (×2): 2 g via INTRAVENOUS
  Filled 2022-07-05 (×2): qty 2

## 2022-07-05 NOTE — Assessment & Plan Note (Signed)
-   We will continue her antihypertensives. 

## 2022-07-05 NOTE — ED Notes (Signed)
Pt states coming in for abdominal pain that started yesterday. Pt states history of abdominal pain. Pt alert and oriented in room.

## 2022-07-05 NOTE — Progress Notes (Signed)
       CROSS COVER NOTE  NAME: Jamie Hodges MRN: [id number] DOB : 1928/08/10 ATTENDING PHYSICIAN: Christel Mormon, MD    Date of Service   07/05/2022   HPI/Events of Note   Notified of elevated BP 180/61.   Interventions   Assessment/Plan:  5 mg IV hydralazine     This document was prepared using Dragon voice recognition software and may include unintentional dictation errors.  Neomia Glass DNP, MBA, FNP-BC Nurse Practitioner [facility] Pager [phone]

## 2022-07-05 NOTE — Assessment & Plan Note (Signed)
-   We will continue amiodarone and Eliquis. 

## 2022-07-05 NOTE — Progress Notes (Signed)
Progress Note    Jamie Hodges  RWE:[id number] DOB: 1927/11/08  DOA: 07/04/2022 PCP: [name], MD      Brief Narrative:    Medical records reviewed and are as summarized below:  Jamie Hodges is a 88 y.o. female with medical history significant for paroxysmal atrial fibrillation, history of stroke, coronary artery disease, chronic diastolic CHF, CKD stage IIIb, C. difficile colitis, chronic diarrhea/IBS, hypertension and dyslipidemia, hospitalized for ischemic enteritis in November 2022, who presented to the hospital with abdominal pain and vomiting.  She was found to have ischemic enteritis.  She declined surgical consultation.  She was treated with IV fluids, analgesics and empiric IV antibiotics.   Assessment/Plan:   Principal Problem:   Ischemic enteritis ([place]) Active Problems:   Essential hypertension   Stage 3b chronic kidney disease (HCC)   Chronic diastolic heart failure (HCC)   Paroxysmal atrial fibrillation (HCC)   Prolonged QT interval    Body mass index is 22.55 kg/m.   Ischemic enteritis: Continue IV cefepime and Flagyl.  Analgesics as needed for pain.  Keep NPO for now.  She declined surgical consultation.  Prolonged QTc interval: QTc 619 on EKG on 07/04/2022.  QTc was 483 on 05/12/2022.  Discontinue amiodarone.  Paroxysmal atrial fibrillation, CAD, history of stroke: Continue Eliquis and Cardizem  Chronic diastolic CHF: Compensated  Other comorbidities include CKD stage IIIb, hypertension, IBS  Diet Order             Diet NPO time specified Except for: Sips with Meds  Diet effective now                            Consultants: None  Procedures: None    Medications:    apixaban  2.5 mg Oral BID   diltiazem  360 mg Oral Daily   losartan  100 mg Oral Daily   Continuous Infusions:  0.9 % NaCl with KCl 20 mEq / L 100 mL/hr at 07/05/22 0334   ceFEPime (MAXIPIME) IV 1 g (07/05/22 1102)   metronidazole Stopped  (07/05/22 1049)     Anti-infectives (From admission, onward)    Start     Dose/Rate Route Frequency Ordered Stop   07/05/22 1100  ceFEPIme (MAXIPIME) 1 g in sodium chloride 0.9 % 100 mL IVPB       Note to Pharmacy: It was previously tolerated and is tolerated in the ER   1 g 200 mL/hr over 30 Minutes Intravenous Every 12 hours 07/05/22 0301     07/05/22 1000  metroNIDAZOLE (FLAGYL) IVPB 500 mg        500 mg 100 mL/hr over 60 Minutes Intravenous Every 8 hours 07/05/22 0301     07/05/22 0115  ceFEPIme (MAXIPIME) 1 g in sodium chloride 0.9 % 100 mL IVPB        1 g 200 mL/hr over 30 Minutes Intravenous  Once 07/05/22 0108 07/05/22 0205   07/05/22 0030  ciprofloxacin (CIPRO) IVPB 400 mg  Status:  Discontinued        400 mg 200 mL/hr over 60 Minutes Intravenous  Once 07/05/22 0015 07/05/22 0119   07/05/22 0030  metroNIDAZOLE (FLAGYL) IVPB 500 mg        500 mg 100 mL/hr over 60 Minutes Intravenous  Once 07/05/22 0015 07/05/22 0304              Family Communication/Anticipated D/C date and plan/Code Status   DVT prophylaxis: apixaban (  ELIQUIS) tablet 2.5 mg Start: 07/05/22 1000 Place and maintain sequential compression device Start: 07/05/22 0931 apixaban (ELIQUIS) tablet 2.5 mg     Code Status: DNR  Family Communication: Plan discussed with Dr. Pauline Aus, son Disposition Plan: Plan to discharge home in 2 to 3 days   Status is: Inpatient Remains inpatient appropriate because: IV antibiotics       Subjective:   Interval events noted.  Abdominal pain is better today.  She grades pain as 2/10.  No vomiting or diarrhea.  Objective:    Vitals:   07/05/22 0600 07/05/22 0800 07/05/22 0950 07/05/22 0951  BP: (!) 148/59 (!) 160/58  (!) 172/67  Pulse: 73 80  84  Resp:    18  Temp:   98 F (36.7 C)   TempSrc:   Oral   SpO2: 93% 94%  95%  Weight:      Height:       No data found.   Intake/Output Summary (Last 24 hours) at 07/05/2022 1241 Last data filed at  07/05/2022 1049 Gross per 24 hour  Intake 100 ml  Output --  Net 100 ml   Filed Weights   07/04/22 1901  Weight: 65.3 kg    Exam:  GEN: NAD SKIN: Warm and dry EYES: No pallor or icterus ENT: MMM CV: RRR PULM: CTA B ABD: soft, ND, mild general abdominal tenderness without rebound tenderness or guarding, +BS CNS: AAO x 3, non focal EXT: No edema or tenderness        Data Reviewed:   I have personally reviewed following labs and imaging studies:  Labs: Labs show the following:   Basic Metabolic Panel: Recent Labs  Lab 07/04/22 1906 07/05/22 0457  NA 140 139  K 4.6 4.4  CL 104 109  CO2 25 24  GLUCOSE 164* 120*  BUN 27* 29*  CREATININE 1.68* 1.61*  CALCIUM 9.0 8.2*  MG  --  2.1   GFR Estimated Creatinine Clearance: 21.7 mL/min (A) (by C-G formula based on SCr of 1.61 mg/dL (H)). Liver Function Tests: Recent Labs  Lab 07/04/22 1906  AST 40  ALT 21  ALKPHOS 110  BILITOT 1.1  PROT 7.4  ALBUMIN 4.0   Recent Labs  Lab 07/04/22 1906  LIPASE 73*   No results for input(s): "AMMONIA" in the last 168 hours. Coagulation profile No results for input(s): "INR", "PROTIME" in the last 168 hours.  CBC: Recent Labs  Lab 07/04/22 1906 07/05/22 0457  WBC 9.9 9.7  HGB 12.7 11.0*  HCT 38.4 32.6*  MCV 91.0 91.3  PLT 208 184   Cardiac Enzymes: No results for input(s): "CKTOTAL", "CKMB", "CKMBINDEX", "TROPONINI" in the last 168 hours. BNP (last 3 results) No results for input(s): "PROBNP" in the last 8760 hours. CBG: No results for input(s): "GLUCAP" in the last 168 hours. D-Dimer: No results for input(s): "DDIMER" in the last 72 hours. Hgb A1c: No results for input(s): "HGBA1C" in the last 72 hours. Lipid Profile: No results for input(s): "CHOL", "HDL", "LDLCALC", "TRIG", "CHOLHDL", "LDLDIRECT" in the last 72 hours. Thyroid function studies: No results for input(s): "TSH", "T4TOTAL", "T3FREE", "THYROIDAB" in the last 72 hours.  Invalid input(s):  "FREET3" Anemia work up: No results for input(s): "VITAMINB12", "FOLATE", "FERRITIN", "TIBC", "IRON", "RETICCTPCT" in the last 72 hours. Sepsis Labs: Recent Labs  Lab 07/04/22 1906 07/05/22 0457  WBC 9.9 9.7    Microbiology No results found for this or any previous visit (from the past 240 hour(s)).  Procedures and  diagnostic studies:  CT ABDOMEN PELVIS WO CONTRAST  Result Date: 07/05/2022 CLINICAL DATA:  Acute abdominal pain. EXAM: CT ABDOMEN AND PELVIS WITHOUT CONTRAST TECHNIQUE: Multidetector CT imaging of the abdomen and pelvis was performed following the standard protocol without IV contrast. RADIATION DOSE REDUCTION: This exam was performed according to the departmental dose-optimization program which includes automated exposure control, adjustment of the mA and/or kV according to patient size and/or use of iterative reconstruction technique. COMPARISON:  CT abdomen and pelvis 07/17/2021 FINDINGS: Lower chest: No acute abnormality. Hepatobiliary: There is peripheral branching gas in the left lobe of the liver worrisome for portal venous gas. No focal liver lesions. The gallbladder surgically absent. No biliary ductal dilatation identified. Pancreas: Unremarkable. No pancreatic ductal dilatation or surrounding inflammatory changes. Spleen: Normal in size without focal abnormality. Adrenals/Urinary Tract: Adrenal glands are unremarkable. Kidneys are normal, without renal calculi, focal lesion, or hydronephrosis. Bladder is unremarkable. Stomach/Bowel: Moderate length segment of small bowel in the left abdomen demonstrate circumferential wall thickening and marked mesenteric edema. These bowel loops are mildly dilated. No definitive bowel obstruction. Small foci of air seen within the mesentery for example image 4/38. No pneumatosis. There is sigmoid and descending colon diverticulosis without evidence for diverticulitis. Appendix is within normal limits. Stomach is within normal limits.  Vascular/Lymphatic: There are severe atherosclerotic calcifications of the aorta and branch vessels. Aorta is normal in size. IVC is normal in size. No enlarged lymph nodes are seen. Reproductive: Uterus and bilateral adnexa are unremarkable. Other: There is a small amount of free fluid in the pelvis. No focal abdominal wall hernia. Musculoskeletal: Degenerative changes affect the spine and hips. IMPRESSION: 1. Moderate length segment of small bowel in the left abdomen with wall thickening, marked mesenteric edema, mesenteric air, and portal venous gas in the liver. Findings are most consistent with ischemic enteritis. Surgical consultation suggested. 2. Small amount of free fluid in the pelvis. 3. Colonic diverticulosis without evidence for diverticulitis. Aortic Atherosclerosis (ICD10-I70.0). These results were called by telephone at the time of interpretation on 07/05/2022 at 12:09 am to provider [facility] , who verbally acknowledged these results. Electronically Signed   By: Ronney Asters M.D.   On: 07/05/2022 00:11   DG Chest 2 View  Result Date: 07/04/2022 CLINICAL DATA:  shortness of breath EXAM: CHEST - 2 VIEW COMPARISON:  Similar fifth 2022 FINDINGS: The cardiomediastinal silhouette is unchanged in contour.Atherosclerotic calcifications. No pleural effusion. No pneumothorax. No acute pleuroparenchymal abnormality. Visualized abdomen is unremarkable. Multilevel degenerative changes of the thoracic spine. IMPRESSION: No acute cardiopulmonary abnormality. Electronically Signed   By: Valentino Saxon M.D.   On: 07/04/2022 19:49               LOS: 0 days   Alieyah Spader  [facility]   Pager on [email or web address]. If 7PM-7AM, please contact night-coverage at [email or web address]     07/05/2022, 12:41 PM

## 2022-07-05 NOTE — Assessment & Plan Note (Addendum)
-   The patient will be admitted to a medical telemetry bed. - We will continue antibiotic therapy with IV cefepime and Flagyl. - She will be continued on hydration with IV normal saline. - We will keep her n.p.o. except for medications. - As mentioned above the patient and her son are refusing surgical intervention.  She had a similar episode that was conservatively managed in December 2022. - We will therefore continue Eliquis at this time.

## 2022-07-05 NOTE — ED Notes (Signed)
Report given to Kelsey, RN

## 2022-07-05 NOTE — H&P (Addendum)
[place]   PATIENT NAME: Jamie Hodges    MR#:  [id number]  DATE OF BIRTH:  1927/08/21  DATE OF ADMISSION:  07/04/2022  PRIMARY CARE PHYSICIAN: [name], MD   Patient is coming from: ALF  REQUESTING/REFERRING PHYSICIAN: [name], MD  CHIEF COMPLAINT:   Chief Complaint  Patient presents with   Abdominal Pain    HISTORY OF PRESENT ILLNESS:  Jamie Hodges is a 88 y.o. Caucasian female with medical history significant for paroxysmal atrial fibrillation, coronary artery disease, C. difficile colitis, hypertension and dyslipidemia, presented to the emergency room with acute onset of abdominal pain that started earlier during the day with associated vomiting once.  No diarrhea.  She has been having normal bowel movements.  No dysuria, oliguria or hematuria, urgency or frequency or flank pain.  No chest pain or dyspnea or cough or wheezing.  No fever or chills.  ED Course: Upon presentation to the emergency room, BP was 161/62 with otherwise normal vital signs.  CMP revealed a creatinine of 1.68 comparable to previous levels and a BUN of 27.  Lipase was 73 and glucose 164.  High sensitive troponin I was 10 and later the same.  CBC was within normal. EKG as reviewed by me : EKG showed normal sinus rhythm with a rate of 90 with premature supraventricular complexes and premature ventricular complexes with prolonged QT interval with QTc of 619 MS. Imaging: Two-view chest x-ray showed no acute cardiopulmonary disease.  The patient was given IV cefepime and Flagyl.  Contact was made with Dr. Lysle Pearl and she was not considered a surgical candidate at this time as she refused to have surgical intervention and after talking to her son who is a physician he agreed based on the fact that she has been treated conservatively for similar episode in December 2022.  She will be admitted to a medical telemetry bed for further evaluation and management. PAST MEDICAL HISTORY:   Past  Medical History:  Diagnosis Date   Atrial fibrillation ([place]) 2010   One time during hospital while sick with severe diarrhea   C. difficile colitis    3/10  Severe C. dif ---had brief atrial fib then. Cath shows some blockages but no intervention   CAD (coronary artery disease) 2010   minor blockages --no intervention indicated   Hx of colonic polyps    Hyperlipidemia    Hypertension     PAST SURGICAL HISTORY:   Past Surgical History:  Procedure Laterality Date   CHOLECYSTECTOMY  3/16   ERCP N/A 01/14/2015   Procedure: ENDOSCOPIC RETROGRADE CHOLANGIOPANCREATOGRAPHY (ERCP);  Surgeon: Lucilla Lame, MD;  Location: [facility] ENDOSCOPY;  Service: Endoscopy;  Laterality: N/A;   TONSILLECTOMY AND ADENOIDECTOMY      SOCIAL HISTORY:   Social History   Tobacco Use   Smoking status: Former    Types: Cigarettes    Quit date: 08/16/1978    Years since quitting: 43.9    Passive exposure: Never   Smokeless tobacco: Never  Substance Use Topics   Alcohol use: Never    FAMILY HISTORY:   Family History  Problem Relation Age of Onset   COPD Mother    Coronary artery disease Neg Hx    Diabetes Neg Hx    Cancer Neg Hx        breast or colon cancer    DRUG ALLERGIES:   Allergies  Allergen Reactions   Codeine Sulfate     REACTION: rash/ welps  Other     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    REVIEW OF SYSTEMS:   ROS As per history of present illness. All pertinent systems were reviewed above.  Constitutional, HEENT, cardiovascular, respiratory, GI, GU, musculoskeletal, neuro, psychiatric, endocrine, integumentary and hematologic systems were reviewed and are otherwise negative/unremarkable except for positive findings mentioned above in the HPI.   MEDICATIONS AT HOME:   Prior to Admission medications   Medication Sig Start Date End Date Taking? Authorizing Provider  acetaminophen (TYLENOL) 325 MG tablet Take 650 mg by mouth every 6 (six) hours as needed.   Yes [provider]  amiodarone (PACERONE) 200 MG tablet Take 1 tablet (200 mg total) by mouth every other day. 02/03/22  Yes [name], MD  apixaban (ELIQUIS) 2.5 MG TABS tablet Take 1 tablet (2.5 mg total) by mouth 2 (two) times daily. 12/29/21  Yes [name], MD  losartan (COZAAR) 100 MG tablet Take 1 tablet (100 mg total) by mouth daily. 05/12/22  Yes Minna Merritts, MD  meclizine (ANTIVERT) 25 MG tablet TAKE 1 TABLET BY MOUTH THREE TIMES DAILY AS NEEDED FOR NAUSEA AND VOMITING 03/22/22  Yes [name], MD  TIADYLT ER 360 MG 24 hr capsule TAKE 1 CAPSULE BY MOUTH ONCE DAILY 03/30/22  Yes [name], MD  cholestyramine (QUESTRAN) 4 g packet Take 1 packet (4 g total) by mouth daily. Patient not taking: Reported on 07/05/2022 11/23/21   Viviana Simpler I, MD  EPINEPHrine 0.3 mg/0.3 mL IJ SOAJ injection Inject 0.3 mg into the muscle as needed (for allergic reaction). Patient not taking: Reported on 05/12/2022    [provider]      VITAL SIGNS:  Blood pressure (!) 136/49, pulse 81, temperature 97.8 F (36.6 C), temperature source Axillary, resp. rate 18, height '5\' 7"'$  (1.702 m), weight 65.3 kg, SpO2 97 %.  PHYSICAL EXAMINATION:  Physical Exam  GENERAL:  88 y.o.-year-old Caucasian female patient lying in the bed with no acute distress.  EYES: Pupils equal, round, reactive to light and accommodation. No scleral icterus. Extraocular muscles intact.  HEENT: Head atraumatic,  normocephalic. Oropharynx and nasopharynx clear.  NECK:  Supple, no jugular venous distention. No thyroid enlargement, no tenderness.  LUNGS: Normal breath sounds bilaterally, no wheezing, rales,rhonchi or crepitation. No use of accessory muscles of respiration.  CARDIOVASCULAR: Regular rate and rhythm, S1, S2 normal. No murmurs, rubs, or gallops.  ABDOMEN: Soft, nondistended, with generalized abdominal tenderness mainly in the upper abdomen and epigastric area without rebound tenderness guarding or rigidity.  Bowel sounds are diminished.. No organomegaly or mass.  EXTREMITIES: No pedal edema, cyanosis, or clubbing.  NEUROLOGIC: Cranial nerves II through XII are intact. Muscle strength 5/5 in all extremities. Sensation intact. Gait not checked.  PSYCHIATRIC: The patient is alert and oriented x 3.  Normal affect and good eye contact. SKIN: No obvious rash, lesion, or ulcer.   LABORATORY PANEL:   CBC Recent Labs  Lab 07/04/22 1906  WBC 9.9  HGB 12.7  HCT 38.4  PLT 208   ------------------------------------------------------------------------------------------------------------------  Chemistries  Recent Labs  Lab 07/04/22 1906  NA 140  K 4.6  CL 104  CO2 25  GLUCOSE 164*  BUN 27*  CREATININE 1.68*  CALCIUM 9.0  AST 40  ALT 21  ALKPHOS 110  BILITOT 1.1   ------------------------------------------------------------------------------------------------------------------  Cardiac Enzymes No results for input(s): "TROPONINI" in the last 168 hours. ------------------------------------------------------------------------------------------------------------------  RADIOLOGY:  CT ABDOMEN PELVIS WO CONTRAST  Result Date: 07/05/2022 CLINICAL DATA:  Acute abdominal pain. EXAM: CT ABDOMEN AND PELVIS WITHOUT CONTRAST TECHNIQUE: Multidetector CT imaging of the abdomen and pelvis was performed following the standard protocol without IV contrast. RADIATION DOSE REDUCTION: This exam was  performed according to the departmental dose-optimization program which includes automated exposure control, adjustment of the mA and/or kV according to patient size and/or use of iterative reconstruction technique. COMPARISON:  CT abdomen and pelvis 07/17/2021 FINDINGS: Lower chest: No acute abnormality. Hepatobiliary: There is peripheral branching gas in the left lobe of the liver worrisome for portal venous gas. No focal liver lesions. The gallbladder surgically absent. No biliary ductal dilatation identified. Pancreas: Unremarkable. No pancreatic ductal dilatation or surrounding inflammatory changes. Spleen: Normal in size without focal abnormality. Adrenals/Urinary Tract: Adrenal glands are unremarkable. Kidneys are normal, without renal calculi, focal lesion, or hydronephrosis. Bladder is unremarkable. Stomach/Bowel: Moderate length segment of small bowel in the left abdomen demonstrate circumferential wall thickening and marked mesenteric edema. These bowel loops are mildly dilated. No definitive bowel obstruction. Small foci of air seen within the mesentery for example image 4/38. No pneumatosis. There is sigmoid and descending colon diverticulosis without evidence for diverticulitis. Appendix is within normal limits. Stomach is within normal limits. Vascular/Lymphatic: There are severe atherosclerotic calcifications of the aorta and branch vessels. Aorta is normal in size. IVC is normal in size. No enlarged lymph nodes are seen. Reproductive: Uterus and bilateral adnexa are unremarkable. Other: There is a small amount of free fluid in the pelvis. No focal abdominal wall hernia. Musculoskeletal: Degenerative changes affect the spine and hips. IMPRESSION: 1. Moderate length segment of small bowel in the left abdomen with wall thickening, marked mesenteric edema, mesenteric air, and portal venous gas in the liver. Findings are most consistent with ischemic enteritis. Surgical consultation suggested. 2. Small  amount of free fluid in the pelvis. 3. Colonic diverticulosis without evidence for diverticulitis. Aortic Atherosclerosis (ICD10-I70.0). These results were called by telephone at the time of interpretation on 07/05/2022 at 12:09 am to provider [facility] , who verbally acknowledged these results. Electronically Signed   By: Ronney Asters M.D.   On: 07/05/2022 00:11   DG Chest 2 View  Result Date: 07/04/2022 CLINICAL DATA:  shortness of breath EXAM: CHEST - 2 VIEW COMPARISON:  Similar fifth 2022 FINDINGS: The cardiomediastinal silhouette is unchanged in contour.Atherosclerotic calcifications. No pleural effusion. No pneumothorax. No acute pleuroparenchymal abnormality. Visualized abdomen is unremarkable. Multilevel degenerative changes of the thoracic spine. IMPRESSION: No acute cardiopulmonary abnormality. Electronically Signed   By: Valentino Saxon M.D.   On: 07/04/2022 19:49      IMPRESSION AND PLAN:  Assessment and Plan: * Ischemic enteritis [facility]) - The patient will be admitted to a medical telemetry bed. - We will continue antibiotic therapy with IV cefepime and Flagyl. - She will be continued on hydration with IV normal saline. - We will keep her n.p.o. except for medications. - As mentioned above the patient and her son are refusing surgical intervention.  She had  a similar episode that was conservatively managed in December 2022. - We will therefore continue Eliquis at this time.  Paroxysmal atrial fibrillation (HCC) - We will continue amiodarone and Eliquis.  Essential hypertension - We will continue her antihypertensives.   DVT prophylaxis: Eliquis. Advanced Care Planning:  Code Status: The patient is DNR/DNI. Family Communication:  The plan of care was discussed in details with the patient (and family). I answered all questions. The patient agreed to proceed with the above mentioned plan. Further management will depend upon hospital course. Disposition Plan: Back to  previous home environment Consults called: none. All the records are reviewed and case discussed with ED provider.  Status is: Inpatient   At the time of the admission, it appears that the appropriate admission status for this patient is inpatient.  This is judged to be reasonable and necessary in order to provide the required intensity of service to ensure the patient's safety given the presenting symptoms, physical exam findings and initial radiographic and laboratory data in the context of comorbid conditions.  The patient requires inpatient status due to high intensity of service, high risk of further deterioration and high frequency of surveillance required.  I certify that at the time of admission, it is my clinical judgment that the patient will require inpatient hospital care extending more than 2 midnights.                            Dispo: The patient is from: ALF              Anticipated d/c is to: ALF              Patient currently is not medically stable to d/c.              Difficult to place patient: No  Christel Mormon M.D on 07/05/2022 at 3:29 AM  [facility]   From 7 PM-7 AM, contact night-coverage [email or web address]  CC: Primary care physician; [name], MD

## 2022-07-05 NOTE — ED Notes (Addendum)
Pt transferred from stretcher to IP bed with use of cane, ambulates independently with steady gait.  Skin is warm and dry, resp reg and unlabored, alert and oriented, NAD.  Is wearing hearing aids, charger placed in patient belongings bag. Call bell within reach.

## 2022-07-05 NOTE — ED Notes (Signed)
Placed on cardiac montior

## 2022-07-06 ENCOUNTER — Encounter: Payer: Self-pay | Admitting: Family Medicine

## 2022-07-06 LAB — CBC WITH DIFFERENTIAL/PLATELET
Abs Immature Granulocytes: 0.05 10*3/uL (ref 0.00–0.07)
Basophils Absolute: 0.1 10*3/uL (ref 0.0–0.1)
Basophils Relative: 1 %
Eosinophils Absolute: 0.1 10*3/uL (ref 0.0–0.5)
Eosinophils Relative: 1 %
HCT: 31.5 % — ABNORMAL LOW (ref 36.0–46.0)
Hemoglobin: 10.7 g/dL — ABNORMAL LOW (ref 12.0–15.0)
Immature Granulocytes: 1 %
Lymphocytes Relative: 13 %
Lymphs Abs: 1.3 10*3/uL (ref 0.7–4.0)
MCH: 31.4 pg (ref 26.0–34.0)
MCHC: 34 g/dL (ref 30.0–36.0)
MCV: 92.4 fL (ref 80.0–100.0)
Monocytes Absolute: 0.5 10*3/uL (ref 0.1–1.0)
Monocytes Relative: 6 %
Neutro Abs: 7.8 10*3/uL — ABNORMAL HIGH (ref 1.7–7.7)
Neutrophils Relative %: 78 %
Platelets: 171 10*3/uL (ref 150–400)
RBC: 3.41 MIL/uL — ABNORMAL LOW (ref 3.87–5.11)
RDW: 14.5 % (ref 11.5–15.5)
WBC: 9.8 10*3/uL (ref 4.0–10.5)
nRBC: 0 % (ref 0.0–0.2)

## 2022-07-06 LAB — BASIC METABOLIC PANEL
Anion gap: 6 (ref 5–15)
BUN: 24 mg/dL — ABNORMAL HIGH (ref 8–23)
CO2: 22 mmol/L (ref 22–32)
Calcium: 8.1 mg/dL — ABNORMAL LOW (ref 8.9–10.3)
Chloride: 111 mmol/L (ref 98–111)
Creatinine, Ser: 1.25 mg/dL — ABNORMAL HIGH (ref 0.44–1.00)
GFR, Estimated: 40 mL/min — ABNORMAL LOW (ref >60)
Glucose, Bld: 93 mg/dL (ref 70–99)
Potassium: 4.4 mmol/L (ref 3.5–5.1)
Sodium: 139 mmol/L (ref 135–145)

## 2022-07-06 MED ORDER — SENNOSIDES-DOCUSATE SODIUM 8.6-50 MG PO TABS: 1.0000 | ORAL_TABLET | Freq: Every evening | ORAL | Status: DC | PRN

## 2022-07-06 MED ORDER — DILTIAZEM HCL ER 120 MG PO TB24
360.0000 mg | ORAL_TABLET | Freq: Every day | ORAL | Status: DC
  Administered 2022-07-06 – 2022-07-07 (×2): 360 mg via ORAL
  Filled 2022-07-06 (×4): qty 3

## 2022-07-06 MED ORDER — POLYETHYLENE GLYCOL 3350 17 G PO PACK
17.0000 g | PACK | Freq: Every day | ORAL | Status: DC | PRN
  Administered 2022-07-07: 17 g via ORAL
  Filled 2022-07-06: qty 1

## 2022-07-06 NOTE — Evaluation (Signed)
Physical Therapy Evaluation Patient Details Name: Jamie Hodges MRN: [id number] DOB: 22-Apr-1928 Today's Date: 07/06/2022  History of Present Illness  Jamie Hodges is a 88 y.o. female with a past medical history of proximal atrial fibrillation, coronary artery disease, hypertension, hyperlipidemia, presents to the emergency department for abdominal pain.  According to the patient she is coming from her care facility for abdominal pain mostly in the upper abdomen with 1 episode of vomiting.   Clinical Impression  Pt admitted with above diagnosis. Pt received upright in bed agreeable to PT services. Pt reports being mod-I with SPC and indep with Adl's/IADL's living in indep living at [facility].   To date, pt transfers OOB mod-I, standing to [facility], and ambulating with supervision ~80' with correct SPC sequencing  and reciprocal pattern and good balance. Pt endorsing mildly slower gait speed compared to baseline but feels grossly at her usual level of function. Pt returning to room in recliner with all needs in reach. Educated pt to continue OOB mobility with nursing staff. Pt understanding. No acute PT needs identified. PT to sign off.     Recommendations for follow up therapy are one component of a multi-disciplinary discharge planning process, led by the attending physician.  Recommendations may be updated based on patient status, additional functional criteria and insurance authorization.  Follow Up Recommendations No PT follow up      Assistance Recommended at Discharge PRN  Patient can return home with the following       Equipment Recommendations    Recommendations for Other Services       Functional Status Assessment Patient has not had a recent decline in their functional status     Precautions / Restrictions Precautions Precautions: Fall Restrictions Weight Bearing Restrictions: No      Mobility  Bed Mobility Overal bed mobility: Modified Independent                Patient Response: Cooperative  Transfers Overall transfer level: Modified independent Equipment used: Straight cane                    Ambulation/Gait Ambulation/Gait assistance: Supervision Gait Distance (Feet): 80 Feet Assistive device: Straight cane Gait Pattern/deviations: Step-through pattern       General Gait Details: correct SPC sequencing with LE's.  Stairs            Wheelchair Mobility    Modified Rankin (Stroke Patients Only)       Balance Overall balance assessment: Needs assistance Sitting-balance support: Bilateral upper extremity supported, Feet supported Sitting balance-Leahy Scale: Fair       Standing balance-Leahy Scale: Fair                               Pertinent Vitals/Pain Pain Assessment Pain Assessment: No/denies pain    Home Living Family/patient expects to be discharged to:: Assisted living                 Home Equipment: Conservation officer, nature (2 wheels);Cane - single point      Prior Function Prior Level of Function : Independent/Modified Independent             Mobility Comments: SPC for ambulation ADLs Comments: reports completing independently     Hand Dominance        Extremity/Trunk Assessment   Upper Extremity Assessment Upper Extremity Assessment: Overall WFL for tasks assessed    Lower Extremity Assessment Lower Extremity Assessment:  Overall [facility] for tasks assessed    Cervical / Trunk Assessment Cervical / Trunk Assessment: Normal  Communication   Communication: HOH  Cognition Arousal/Alertness: Awake/alert Behavior During Therapy: WFL for tasks assessed/performed Overall Cognitive Status: Within Functional Limits for tasks assessed                                          General Comments      Exercises Other Exercises Other Exercises: Role of PT in acute setting, d/c recs.   Assessment/Plan    PT Assessment Patient does not need any further PT  services  PT Problem List         PT Treatment Interventions      PT Goals (Current goals can be found in the Care Plan section)  Acute Rehab PT Goals PT Goal Formulation: All assessment and education complete, DC therapy    Frequency       Co-evaluation               AM-PAC PT "6 Clicks" Mobility  Outcome Measure Help needed turning from your back to your side while in a flat bed without using bedrails?: None Help needed moving from lying on your back to sitting on the side of a flat bed without using bedrails?: None Help needed moving to and from a bed to a chair (including a wheelchair)?: A Little Help needed standing up from a chair using your arms (e.g., wheelchair or bedside chair)?: A Little Help needed to walk in hospital room?: A Little Help needed climbing 3-5 steps with a railing? : A Little 6 Click Score: 20    End of Session Equipment Utilized During Treatment: Gait belt Activity Tolerance: Patient tolerated treatment well Patient left: in chair;with call bell/phone within reach;with chair alarm set Nurse Communication: Mobility status      Time: 0962-8366 PT Time Calculation (min) (ACUTE ONLY): 16 min   Charges:   PT Evaluation $PT Eval Low Complexity: [place] M. Fairly IV, PT, DPT Physical Therapist- [facility]  07/06/2022, 3:15 PM

## 2022-07-06 NOTE — Progress Notes (Signed)
Progress Note    Jamie Hodges  ELF:[id number] DOB: [birth year]  DOA: 07/04/2022 PCP: [name], MD      Brief Narrative:    Medical records reviewed and are as summarized below:  Jamie Hodges is a 88 y.o. female with medical history significant for paroxysmal atrial fibrillation, history of stroke, coronary artery disease, chronic diastolic CHF, CKD stage IIIb, C. difficile colitis, chronic diarrhea/IBS, hypertension and dyslipidemia, hospitalized for ischemic enteritis in November 2022, who presented to the hospital with abdominal pain and vomiting.  She was found to have ischemic enteritis.  She declined surgical consultation.  She was treated with IV fluids, analgesics and empiric IV antibiotics.   Assessment/Plan:   Principal Problem:   Ischemic enteritis ([place]) Active Problems:   Essential hypertension   Stage 3b chronic kidney disease (HCC)   Chronic diastolic heart failure (HCC)   Paroxysmal atrial fibrillation (HCC)   Prolonged QT interval    Body mass index is 22.55 kg/m.   Ischemic enteritis: Continue IV ceftriaxone and Flagyl.  Analgesics as needed for pain.  Diet has been advanced from clears to full liquid diet.  Prolonged QTc interval:  Improved from 619 on 07/04/2022 to 475 on today's EKG. QTc was 483 on 05/12/2022. Amiodarone has been discontinued  Paroxysmal atrial fibrillation, CAD, history of stroke: Continue Eliquis and Cardizem  Chronic diastolic CHF: Compensated  MiraLAX or Senokot as needed for constipation  Other comorbidities include CKD stage IIIb, hypertension, IBS  Diet Order             Diet full liquid Room service appropriate? Yes; Fluid consistency: Thin  Diet effective now                            Consultants: None  Procedures: None    Medications:    apixaban  2.5 mg Oral BID   diltiazem  360 mg Oral Daily   losartan  100 mg Oral Daily   Continuous Infusions:  cefTRIAXone (ROCEPHIN)  IV 2  g (07/06/22 5852)   metronidazole 500 mg (07/06/22 1021)     Anti-infectives (From admission, onward)    Start     Dose/Rate Route Frequency Ordered Stop   07/06/22 1000  cefTRIAXone (ROCEPHIN) 2 g in sodium chloride 0.9 % 100 mL IVPB        2 g 200 mL/hr over 30 Minutes Intravenous Every 24 hours 07/05/22 1629     07/05/22 1100  ceFEPIme (MAXIPIME) 1 g in sodium chloride 0.9 % 100 mL IVPB  Status:  Discontinued       Note to Pharmacy: It was previously tolerated and is tolerated in the ER   1 g 200 mL/hr over 30 Minutes Intravenous Every 12 hours 07/05/22 0301 07/05/22 1628   07/05/22 1000  metroNIDAZOLE (FLAGYL) IVPB 500 mg        500 mg 100 mL/hr over 60 Minutes Intravenous Every 8 hours 07/05/22 0301     07/05/22 0115  ceFEPIme (MAXIPIME) 1 g in sodium chloride 0.9 % 100 mL IVPB        1 g 200 mL/hr over 30 Minutes Intravenous  Once 07/05/22 0108 07/05/22 0205   07/05/22 0030  ciprofloxacin (CIPRO) IVPB 400 mg  Status:  Discontinued        400 mg 200 mL/hr over 60 Minutes Intravenous  Once 07/05/22 0015 07/05/22 0119   07/05/22 0030  metroNIDAZOLE (FLAGYL) IVPB 500 mg  500 mg 100 mL/hr over 60 Minutes Intravenous  Once 07/05/22 0015 07/05/22 0304              Family Communication/Anticipated D/C date and plan/Code Status   DVT prophylaxis: apixaban (ELIQUIS) tablet 2.5 mg Start: 07/05/22 1000 Place and maintain sequential compression device Start: 07/05/22 0931 apixaban (ELIQUIS) tablet 2.5 mg     Code Status: DNR  Family Communication: None Disposition Plan: Plan to discharge home tomorrow   Status is: Inpatient Remains inpatient appropriate because: IV antibiotics       Subjective:   Interval events noted.  Abdominal pain is better today.  No vomiting or diarrhea.  She normally has regular bowel movements but she has not had any bowel movement since admission.  Objective:    Vitals:   07/05/22 2109 07/06/22 0024 07/06/22 0455 07/06/22 0734   BP: (!) 180/61 (!) 158/56 (!) 153/58 (!) 148/59  Pulse: 79 78 79 72  Resp: '20  16 16  '$ Temp: 99.1 F (37.3 C)  98.2 F (36.8 C) 98.4 F (36.9 C)  TempSrc: Oral  Oral Oral  SpO2: 95%  96% 97%  Weight:      Height:       No data found.   Intake/Output Summary (Last 24 hours) at 07/06/2022 1553 Last data filed at 07/06/2022 1100 Gross per 24 hour  Intake 1064.57 ml  Output 1000 ml  Net 64.57 ml   Filed Weights   07/04/22 1901  Weight: 65.3 kg    Exam:   GEN: NAD SKIN: Warm and dry EYES: No pallor or icterus ENT: MMM CV: RRR PULM: CTA B ABD: soft, ND, mild mid abdominal tenderness without rebound tenderness or guarding, +BS CNS: AAO x 3, non focal EXT: No edema or tenderness       Data Reviewed:   I have personally reviewed following labs and imaging studies:  Labs: Labs show the following:   Basic Metabolic Panel: Recent Labs  Lab 07/04/22 1906 07/05/22 0457 07/06/22 0453  NA 140 139 139  K 4.6 4.4 4.4  CL 104 109 111  CO2 '25 24 22  '$ GLUCOSE 164* 120* 93  BUN 27* 29* 24*  CREATININE 1.68* 1.61* 1.25*  CALCIUM 9.0 8.2* 8.1*  MG  --  2.1  --    GFR Estimated Creatinine Clearance: 27.9 mL/min (A) (by C-G formula based on SCr of 1.25 mg/dL (H)). Liver Function Tests: Recent Labs  Lab 07/04/22 1906  AST 40  ALT 21  ALKPHOS 110  BILITOT 1.1  PROT 7.4  ALBUMIN 4.0   Recent Labs  Lab 07/04/22 1906  LIPASE 73*   No results for input(s): "AMMONIA" in the last 168 hours. Coagulation profile No results for input(s): "INR", "PROTIME" in the last 168 hours.  CBC: Recent Labs  Lab 07/04/22 1906 07/05/22 0457 07/06/22 0453  WBC 9.9 9.7 9.8  NEUTROABS  --   --  7.8*  HGB 12.7 11.0* 10.7*  HCT 38.4 32.6* 31.5*  MCV 91.0 91.3 92.4  PLT 208 184 171   Cardiac Enzymes: No results for input(s): "CKTOTAL", "CKMB", "CKMBINDEX", "TROPONINI" in the last 168 hours. BNP (last 3 results) No results for input(s): "PROBNP" in the last 8760  hours. CBG: No results for input(s): "GLUCAP" in the last 168 hours. D-Dimer: No results for input(s): "DDIMER" in the last 72 hours. Hgb A1c: No results for input(s): "HGBA1C" in the last 72 hours. Lipid Profile: No results for input(s): "CHOL", "HDL", "LDLCALC", "TRIG", "CHOLHDL", "LDLDIRECT" in  the last 72 hours. Thyroid function studies: No results for input(s): "TSH", "T4TOTAL", "T3FREE", "THYROIDAB" in the last 72 hours.  Invalid input(s): "FREET3" Anemia work up: No results for input(s): "VITAMINB12", "FOLATE", "FERRITIN", "TIBC", "IRON", "RETICCTPCT" in the last 72 hours. Sepsis Labs: Recent Labs  Lab 07/04/22 1906 07/05/22 0457 07/06/22 0453  WBC 9.9 9.7 9.8    Microbiology No results found for this or any previous visit (from the past 240 hour(s)).  Procedures and diagnostic studies:  CT ABDOMEN PELVIS WO CONTRAST  Result Date: 07/05/2022 CLINICAL DATA:  Acute abdominal pain. EXAM: CT ABDOMEN AND PELVIS WITHOUT CONTRAST TECHNIQUE: Multidetector CT imaging of the abdomen and pelvis was performed following the standard protocol without IV contrast. RADIATION DOSE REDUCTION: This exam was performed according to the departmental dose-optimization program which includes automated exposure control, adjustment of the mA and/or kV according to patient size and/or use of iterative reconstruction technique. COMPARISON:  CT abdomen and pelvis 07/17/2021 FINDINGS: Lower chest: No acute abnormality. Hepatobiliary: There is peripheral branching gas in the left lobe of the liver worrisome for portal venous gas. No focal liver lesions. The gallbladder surgically absent. No biliary ductal dilatation identified. Pancreas: Unremarkable. No pancreatic ductal dilatation or surrounding inflammatory changes. Spleen: Normal in size without focal abnormality. Adrenals/Urinary Tract: Adrenal glands are unremarkable. Kidneys are normal, without renal calculi, focal lesion, or hydronephrosis. Bladder  is unremarkable. Stomach/Bowel: Moderate length segment of small bowel in the left abdomen demonstrate circumferential wall thickening and marked mesenteric edema. These bowel loops are mildly dilated. No definitive bowel obstruction. Small foci of air seen within the mesentery for example image 4/38. No pneumatosis. There is sigmoid and descending colon diverticulosis without evidence for diverticulitis. Appendix is within normal limits. Stomach is within normal limits. Vascular/Lymphatic: There are severe atherosclerotic calcifications of the aorta and branch vessels. Aorta is normal in size. IVC is normal in size. No enlarged lymph nodes are seen. Reproductive: Uterus and bilateral adnexa are unremarkable. Other: There is a small amount of free fluid in the pelvis. No focal abdominal wall hernia. Musculoskeletal: Degenerative changes affect the spine and hips. IMPRESSION: 1. Moderate length segment of small bowel in the left abdomen with wall thickening, marked mesenteric edema, mesenteric air, and portal venous gas in the liver. Findings are most consistent with ischemic enteritis. Surgical consultation suggested. 2. Small amount of free fluid in the pelvis. 3. Colonic diverticulosis without evidence for diverticulitis. Aortic Atherosclerosis (ICD10-I70.0). These results were called by telephone at the time of interpretation on 07/05/2022 at 12:09 am to provider [facility] , who verbally acknowledged these results. Electronically Signed   By: Ronney Asters M.D.   On: 07/05/2022 00:11   DG Chest 2 View  Result Date: 07/04/2022 CLINICAL DATA:  shortness of breath EXAM: CHEST - 2 VIEW COMPARISON:  Similar fifth 2022 FINDINGS: The cardiomediastinal silhouette is unchanged in contour.Atherosclerotic calcifications. No pleural effusion. No pneumothorax. No acute pleuroparenchymal abnormality. Visualized abdomen is unremarkable. Multilevel degenerative changes of the thoracic spine. IMPRESSION: No acute  cardiopulmonary abnormality. Electronically Signed   By: Valentino Saxon M.D.   On: 07/04/2022 19:49               LOS: 1 day   Jamie Hodges  [facility]   Pager on [email or web address]. If 7PM-7AM, please contact night-coverage at [email or web address]     07/06/2022, 3:53 PM

## 2022-07-06 NOTE — TOC Progression Note (Signed)
Transition of Care [facility]) - Progression Note    Patient Details  Name: Vannah Nadal MRN: [id number] Date of Birth: Nov 08, 1927  [facility] of [facility]) CM/SW Contact  Beverly Sessions, RN Phone Number: 07/06/2022, 2:51 PM  Clinical Narrative:     Admitted LTR:[id number]  Admitted from:[facility]  Current home health/prior home health/DME:[facility]  Per PT no follow up.  PT note pending  Patient states that she will need [facility] transport at discharge  Per Seth Bake at [facility] call Anda Kraft [phone] for transport         Expected Discharge Plan and Services                                                 Social Determinants of Health (SDOH) Interventions    Readmission Risk Interventions     No data to display

## 2022-07-07 ENCOUNTER — Encounter: Payer: Self-pay | Admitting: Family Medicine

## 2022-07-07 MED ORDER — CEFUROXIME AXETIL 250 MG PO TABS: 250.0000 mg | ORAL_TABLET | Freq: Two times a day (BID) | ORAL | 0 refills | Status: AC

## 2022-07-07 MED ORDER — SENNOSIDES-DOCUSATE SODIUM 8.6-50 MG PO TABS: 1.0000 | ORAL_TABLET | Freq: Every evening | ORAL | 0 refills | Status: DC | PRN

## 2022-07-07 MED ORDER — CEFUROXIME AXETIL 250 MG PO TABS: 250.0000 mg | ORAL_TABLET | Freq: Two times a day (BID) | ORAL | Status: DC

## 2022-07-07 MED ORDER — HYDRALAZINE HCL 20 MG/ML IJ SOLN
5.0000 mg | Freq: Four times a day (QID) | INTRAMUSCULAR | Status: DC | PRN
  Administered 2022-07-07: 5 mg via INTRAVENOUS
  Filled 2022-07-07: qty 1

## 2022-07-07 MED ORDER — METRONIDAZOLE 500 MG PO TABS: 500.0000 mg | ORAL_TABLET | Freq: Two times a day (BID) | ORAL | 0 refills | Status: AC

## 2022-07-07 MED ORDER — METRONIDAZOLE 500 MG PO TABS: 500.0000 mg | ORAL_TABLET | Freq: Two times a day (BID) | ORAL | Status: DC

## 2022-07-07 NOTE — Care Management Important Message (Signed)
Important Message  Patient Details  Name: Jamie Hodges MRN: [id number] Date of Birth: [birth year]   Medicare Important Message Given:  Yes     Dannette Barbara 07/07/2022, 10:29 AM

## 2022-07-07 NOTE — Plan of Care (Signed)

## 2022-07-07 NOTE — Progress Notes (Signed)
Discharge instructions were reviewed with pt and daughter in law. Questions were answered. IV was taken out. Pt denies pain at this time. Belongings were collected by patient.

## 2022-07-07 NOTE — Discharge Summary (Signed)
Physician Discharge Summary   Patient: Jamie Hodges MRN: [id number] DOB: 03-09-1928  Admit date:     07/04/2022  Discharge date: 07/07/22  Discharge Physician: Fritzi Mandes   PCP: [name], MD   Recommendations at discharge:    F/u PCP in 1-2 weeks  Discharge Diagnoses: Principal Problem:   Ischemic enteritis [facility]) Active Problems:   Essential hypertension   Stage 3b chronic kidney disease ([place])   Chronic diastolic heart failure (HCC)   Paroxysmal atrial fibrillation (HCC)   Prolonged QT interval   Hospital Course:  Jamie Hodges is a 88 y.o. female with medical history significant for paroxysmal atrial fibrillation, history of stroke, coronary artery disease, chronic diastolic CHF, CKD stage IIIb, C. difficile colitis, chronic diarrhea/IBS, hypertension and dyslipidemia, hospitalized for ischemic enteritis in November 2022, who presented to the hospital with abdominal pain and vomiting.  She was found to have ischemic enteritis.  She declined surgical consultation.  She was treated with IV fluids, analgesics and empiric IV antibiotics.  Ischemic enteritis:  --IV ceftriaxone and Flagyl-- change to po abxs -- Analgesics as needed for pain.   --Diet has been advanced to soft food. Pt tolerating it well   Prolonged QTc interval:  Improved from 619 on 07/04/2022 to 475 on today's EKG. QTc was 483 on 05/12/2022. Amiodarone has been discontinued for now   Paroxysmal atrial fibrillation, CAD, history of stroke: Continue Eliquis and Cardizem   Chronic diastolic CHF: Compensated   MiraLAX or Senokot as needed for constipation   Other comorbidities include CKD stage IIIb, hypertension, IBS  Overall feels stable. D/c to [facility]      Consultants: none--pt declined  surgical consultation Procedures performed: none  Disposition:  [facility] Diet recommendation:  Discharge Diet Orders (From admission, onward)     Start     Ordered   07/07/22 0000  Diet - low sodium  heart healthy        07/07/22 1317           Cardiac diet DISCHARGE MEDICATION: Allergies as of 07/07/2022       Reactions   Codeine Sulfate    REACTION: rash/ welps   Other    Wild rice   Crestor [rosuvastatin Calcium]    myalgia   Penicillins Hives, Rash   Has patient had a PCN reaction causing immediate rash, facial/tongue/throat swelling, SOB or lightheadedness with hypotension: Yes Has patient had a PCN reaction causing severe rash involving mucus membranes or skin necrosis: No Has patient had a PCN reaction that required hospitalization: No Has patient had a PCN reaction occurring within the last 10 years: Yes If all of the above answers are "NO", then may proceed with Cephalosporin use.        Medication List     STOP taking these medications    amiodarone 200 MG tablet Commonly known as: Pacerone       TAKE these medications    acetaminophen 325 MG tablet Commonly known as: TYLENOL Take 650 mg by mouth every 6 (six) hours as needed.   apixaban 2.5 MG Tabs tablet Commonly known as: ELIQUIS Take 1 tablet (2.5 mg total) by mouth 2 (two) times daily.   cefUROXime 250 MG tablet Commonly known as: CEFTIN Take 1 tablet (250 mg total) by mouth 2 (two) times daily with a meal for 5 days. Start taking on: July 08, 2022   losartan 100 MG tablet Commonly known as: COZAAR Take 1 tablet (100 mg total) by mouth daily.  meclizine 25 MG tablet Commonly known as: ANTIVERT TAKE 1 TABLET BY MOUTH THREE TIMES DAILY AS NEEDED FOR NAUSEA AND VOMITING   metroNIDAZOLE 500 MG tablet Commonly known as: FLAGYL Take 1 tablet (500 mg total) by mouth every 12 (twelve) hours for 5 days. Start taking on: July 08, 2022   senna-docusate 8.6-50 MG tablet Commonly known as: Senokot-S Take 1 tablet by mouth at bedtime as needed for mild constipation.   Tiadylt ER 360 MG 24 hr capsule Generic drug: diltiazem TAKE 1 CAPSULE BY MOUTH ONCE DAILY        Follow-up  Information     Viviana Simpler I, MD. Schedule an appointment as soon as possible for a visit in 1 week(s).   Specialties: Internal Medicine, Pediatrics Contact information: [place] [address] [phone]                 Condition at discharge: fair  The results of significant diagnostics from this hospitalization (including imaging, microbiology, ancillary and laboratory) are listed below for reference.   Imaging Studies: CT ABDOMEN PELVIS WO CONTRAST  Result Date: 07/05/2022 CLINICAL DATA:  Acute abdominal pain. EXAM: CT ABDOMEN AND PELVIS WITHOUT CONTRAST TECHNIQUE: Multidetector CT imaging of the abdomen and pelvis was performed following the standard protocol without IV contrast. RADIATION DOSE REDUCTION: This exam was performed according to the departmental dose-optimization program which includes automated exposure control, adjustment of the mA and/or kV according to patient size and/or use of iterative reconstruction technique. COMPARISON:  CT abdomen and pelvis 07/17/2021 FINDINGS: Lower chest: No acute abnormality. Hepatobiliary: There is peripheral branching gas in the left lobe of the liver worrisome for portal venous gas. No focal liver lesions. The gallbladder surgically absent. No biliary ductal dilatation identified. Pancreas: Unremarkable. No pancreatic ductal dilatation or surrounding inflammatory changes. Spleen: Normal in size without focal abnormality. Adrenals/Urinary Tract: Adrenal glands are unremarkable. Kidneys are normal, without renal calculi, focal lesion, or hydronephrosis. Bladder is unremarkable. Stomach/Bowel: Moderate length segment of small bowel in the left abdomen demonstrate circumferential wall thickening and marked mesenteric edema. These bowel loops are mildly dilated. No definitive bowel obstruction. Small foci of air seen within the mesentery for example image 4/38. No pneumatosis. There is sigmoid and descending colon  diverticulosis without evidence for diverticulitis. Appendix is within normal limits. Stomach is within normal limits. Vascular/Lymphatic: There are severe atherosclerotic calcifications of the aorta and branch vessels. Aorta is normal in size. IVC is normal in size. No enlarged lymph nodes are seen. Reproductive: Uterus and bilateral adnexa are unremarkable. Other: There is a small amount of free fluid in the pelvis. No focal abdominal wall hernia. Musculoskeletal: Degenerative changes affect the spine and hips. IMPRESSION: 1. Moderate length segment of small bowel in the left abdomen with wall thickening, marked mesenteric edema, mesenteric air, and portal venous gas in the liver. Findings are most consistent with ischemic enteritis. Surgical consultation suggested. 2. Small amount of free fluid in the pelvis. 3. Colonic diverticulosis without evidence for diverticulitis. Aortic Atherosclerosis (ICD10-I70.0). These results were called by telephone at the time of interpretation on 07/05/2022 at 12:09 am to provider [facility] , who verbally acknowledged these results. Electronically Signed   By: Ronney Asters M.D.   On: 07/05/2022 00:11   DG Chest 2 View  Result Date: 07/04/2022 CLINICAL DATA:  shortness of breath EXAM: CHEST - 2 VIEW COMPARISON:  Similar fifth 2022 FINDINGS: The cardiomediastinal silhouette is unchanged in contour.Atherosclerotic calcifications. No pleural effusion. No  pneumothorax. No acute pleuroparenchymal abnormality. Visualized abdomen is unremarkable. Multilevel degenerative changes of the thoracic spine. IMPRESSION: No acute cardiopulmonary abnormality. Electronically Signed   By: Valentino Saxon M.D.   On: 07/04/2022 19:49    Microbiology: Results for orders placed or performed during the hospital encounter of 07/14/21  Resp Panel by RT-PCR (Flu A&B, Covid) Nasopharyngeal Swab     Status: None   Collection Time: 07/14/21  1:33 AM   Specimen: Nasopharyngeal Swab;  Nasopharyngeal(NP) swabs in vial transport medium  Result Value Ref Range Status   SARS Coronavirus 2 by RT PCR NEGATIVE NEGATIVE Final    Comment: (NOTE) SARS-CoV-2 target nucleic acids are NOT DETECTED.  The SARS-CoV-2 RNA is generally detectable in upper respiratory specimens during the acute phase of infection. The lowest concentration of SARS-CoV-2 viral copies this assay can detect is 138 copies/mL. A negative result does not preclude SARS-Cov-2 infection and should not be used as the sole basis for treatment or other patient management decisions. A negative result may occur with  improper specimen collection/handling, submission of specimen other than nasopharyngeal swab, presence of viral mutation(s) within the areas targeted by this assay, and inadequate number of viral copies(<138 copies/mL). A negative result must be combined with clinical observations, patient history, and epidemiological information. The expected result is Negative.  Fact Sheet for Patients:  EntrepreneurPulse.com.au  Fact Sheet for Healthcare Providers:  IncredibleEmployment.be  This test is no t yet approved or cleared by the [place] FDA and  has been authorized for detection and/or diagnosis of SARS-CoV-2 by FDA under an Emergency Use Authorization (EUA). This EUA will remain  in effect (meaning this test can be used) for the duration of the COVID-19 declaration under Section 564(b)(1) of the Act, 21 U.S.C.section 360bbb-3(b)(1), unless the authorization is terminated  or revoked sooner.       Influenza A by PCR NEGATIVE NEGATIVE Final   Influenza B by PCR NEGATIVE NEGATIVE Final    Comment: (NOTE) The Xpert Xpress SARS-CoV-2/FLU/RSV plus assay is intended as an aid in the diagnosis of influenza from Nasopharyngeal swab specimens and should not be used as a sole basis for treatment. Nasal washings and aspirates are unacceptable for Xpert Xpress  SARS-CoV-2/FLU/RSV testing.  Fact Sheet for Patients: EntrepreneurPulse.com.au  Fact Sheet for Healthcare Providers: IncredibleEmployment.be  This test is not yet approved or cleared by the [place] FDA and has been authorized for detection and/or diagnosis of SARS-CoV-2 by FDA under an Emergency Use Authorization (EUA). This EUA will remain in effect (meaning this test can be used) for the duration of the COVID-19 declaration under Section 564(b)(1) of the Act, 21 U.S.C. section 360bbb-3(b)(1), unless the authorization is terminated or revoked.  Performed at [facility], [address] [place],  [address]     Labs: CBC: Recent Labs  Lab 07/04/22 1906 07/05/22 0457 07/06/22 0453  WBC 9.9 9.7 9.8  NEUTROABS  --   --  7.8*  HGB 12.7 11.0* 10.7*  HCT 38.4 32.6* 31.5*  MCV 91.0 91.3 92.4  PLT 208 184 194   Basic Metabolic Panel: Recent Labs  Lab 07/04/22 1906 07/05/22 0457 07/06/22 0453  NA 140 139 139  K 4.6 4.4 4.4  CL 104 109 111  CO2 '25 24 22  '$ GLUCOSE 164* 120* 93  BUN 27* 29* 24*  CREATININE 1.68* 1.61* 1.25*  CALCIUM 9.0 8.2* 8.1*  MG  --  2.1  --    Liver Function Tests: Recent Labs  Lab 07/04/22  1906  AST 40  ALT 21  ALKPHOS 110  BILITOT 1.1  PROT 7.4  ALBUMIN 4.0   CBG: No results for input(s): "GLUCAP" in the last 168 hours.  Discharge time spent: greater than 30 minutes.  Signed: Fritzi Mandes, MD [facility] 07/07/2022

## 2022-07-09 ENCOUNTER — Telehealth: Payer: Self-pay

## 2022-07-09 NOTE — Telephone Encounter (Signed)
Transition Care Management Follow-up Telephone Call Date of discharge and from where: 07/07/22 [facility]  How have you been since you were released from the hospital? Stable with continued loose bowels  Any questions or concerns? No  Items Reviewed: Did the pt receive and understand the discharge instructions provided? Yes  Medications obtained and verified? Yes  Other?  N/a Any new allergies since your discharge? No  Dietary orders reviewed? Yes Do you have support at home? Yes   Home Care and Equipment/Supplies: Were home health services ordered? not applicable If so, what is the name of the agency? N/a   Has the agency set up a time to come to the patient's home? not applicable Were any new equipment or medical supplies ordered?  No What is the name of the medical supply agency? N/a  Were you able to get the supplies/equipment? not applicable Do you have any questions related to the use of the equipment or supplies? No  Functional Questionnaire: (I = Independent and D = Dependent) ADLs: I  Bathing/Dressing- I  Meal Prep- I  Eating- I  Maintaining continence- I  Transferring/Ambulation- I  Managing Meds- I  Follow up appointments reviewed:  PCP Hospital f/u appt confirmed?  Patient is requesting to only see Dr. Silvio Pate and also requesting afternoon appointment due to bowels, please advise.  [facility] f/u appt confirmed?  N/a   Are transportation arrangements needed? No  If their condition worsens, is the pt aware to call PCP or go to the Emergency Dept.? Yes Was the patient provided with contact information for the PCP's office or ED? Yes Was to pt encouraged to call back with questions or concerns? Yes

## 2022-07-09 NOTE — Telephone Encounter (Signed)
Transition Care Management Unsuccessful Follow-up Telephone Call  Date of discharge and from where:  07/07/22 [facility]   Attempts:  1st Attempt  Reason for unsuccessful TCM follow-up call:  Left voice message

## 2022-07-12 ENCOUNTER — Telehealth: Payer: Self-pay

## 2022-07-12 NOTE — Telephone Encounter (Signed)
Noted that patient already contacted and scheduled

## 2022-07-12 NOTE — Telephone Encounter (Signed)
Called to see how pt was doing after recent hospital stay. I also wanted to let her know that I changed her appointment on 07-28-22 to a hospital and 6 month follow-up. She can make her hospital follow-up sooner if she would like.

## 2022-07-12 NOTE — Telephone Encounter (Signed)
Pt has an appt on 07-28-22. I have changed it to a 30 min appt to do a hospital follow-up. If she calls back and is not doing well, not eating then we need to schedule her sooner for hospital f/u.

## 2022-07-14 ENCOUNTER — Telehealth: Payer: Self-pay

## 2022-07-14 NOTE — Chronic Care Management (AMB) (Cosign Needed)
    Chronic Care Management Pharmacy Assistant   Name: Jamie Hodges  MRN: [id number] DOB: August 05, 1928  Reason for Encounter: Hospital Follow Up non CCM   Medications: Outpatient Encounter Medications as of 07/14/2022  Medication Sig   acetaminophen (TYLENOL) 325 MG tablet Take 650 mg by mouth every 6 (six) hours as needed.   apixaban (ELIQUIS) 2.5 MG TABS tablet Take 1 tablet (2.5 mg total) by mouth 2 (two) times daily.   losartan (COZAAR) 100 MG tablet Take 1 tablet (100 mg total) by mouth daily.   meclizine (ANTIVERT) 25 MG tablet TAKE 1 TABLET BY MOUTH THREE TIMES DAILY AS NEEDED FOR NAUSEA AND VOMITING   senna-docusate (SENOKOT-S) 8.6-50 MG tablet Take 1 tablet by mouth at bedtime as needed for mild constipation.   TIADYLT ER 360 MG 24 hr capsule TAKE 1 CAPSULE BY MOUTH ONCE DAILY   No facility-administered encounter medications on file as of 07/14/2022.     Reviewed hospital notes for details of recent visit. Has patient been contacted by Transitions of Care team? Yes Has patient seen PCP/specialist for hospital follow up (summarize OV if yes): No  Admitted to the hospital on 07/04/22. Discharge date was 07/07/22.  Discharged from [facility].   Discharge diagnosis (Principal Problem): Ischemic enteritis Patient was discharged to Home  Brief summary of hospital course:  presented to the hospital with abdominal pain and vomiting.  She was found to have ischemic enteritis.  She declined surgical consultation.  She was treated with IV fluids, analgesics and empiric IV antibiotics.-IV ceftriaxone and Flagyl-- change to po abxs,Amiodarone has been discontinued for now  Analgesics as needed for pain.patient declined surgical consult, D/c to [place]?Medications Started at [facility] Discharge:?? -Started Ceftin  flagyl   Medications Discontinued at Hospital Discharge: -Stopped  amiodarone  Medications that remain the same after Hospital Discharge:??  -All other  medications will remain the same.    Next CCM appt: none  Other upcoming appts: PCP appointment on 07/28/22  Charlene Brooke, PharmD notified and will determine if action is needed.  Avel Sensor, [place]  [phone]   Pharmacist addendum: Pt has spoken with [facility] team. She has PCP appt scheduled. No further action needed.  Charlene Brooke, PharmD, BCACP 07/15/22 12:28 PM

## 2022-07-28 ENCOUNTER — Encounter: Payer: Self-pay | Admitting: Internal Medicine

## 2022-07-28 ENCOUNTER — Ambulatory Visit (INDEPENDENT_AMBULATORY_CARE_PROVIDER_SITE_OTHER): Payer: Medicare Other | Admitting: Internal Medicine

## 2022-07-28 VITALS — BP 120/62 | HR 81 | Temp 98.6°F | Ht 67.0 in | Wt 139.0 lb

## 2022-07-28 DIAGNOSIS — K559 Vascular disorder of intestine, unspecified: Secondary | ICD-10-CM

## 2022-07-28 DIAGNOSIS — I25119 Atherosclerotic heart disease of native coronary artery with unspecified angina pectoris: Secondary | ICD-10-CM

## 2022-07-28 MED ORDER — HYDROCODONE-ACETAMINOPHEN 5-325 MG PO TABS: 1.0000 | ORAL_TABLET | ORAL | 0 refills | Status: DC | PRN

## 2022-07-28 NOTE — Progress Notes (Signed)
Subjective:    Patient ID: Jamie Hodges, female    DOB: [birth year], 88 y.o.   MRN: [id number]  HPI Here for hospital follow up Had another episode of ischemic enteritis Treated with analgesics and IV antibiotics Did improve so went home--this was about 3 weeks ago  No symptoms since the last hospital stay Eating fine  No N/V No provoking features  Current Outpatient Medications on File Prior to Visit  Medication Sig Dispense Refill   acetaminophen (TYLENOL) 325 MG tablet Take 650 mg by mouth every 6 (six) hours as needed.     apixaban (ELIQUIS) 2.5 MG TABS tablet Take 1 tablet (2.5 mg total) by mouth 2 (two) times daily. 180 tablet 3   losartan (COZAAR) 100 MG tablet Take 1 tablet (100 mg total) by mouth daily. 90 tablet 3   meclizine (ANTIVERT) 25 MG tablet TAKE 1 TABLET BY MOUTH THREE TIMES DAILY AS NEEDED FOR NAUSEA AND VOMITING 60 tablet 0   TIADYLT ER 360 MG 24 hr capsule TAKE 1 CAPSULE BY MOUTH ONCE DAILY 90 capsule 3   senna-docusate (SENOKOT-S) 8.6-50 MG tablet Take 1 tablet by mouth at bedtime as needed for mild constipation. (Patient not taking: Reported on 07/28/2022) 30 tablet 0   No current facility-administered medications on file prior to visit.    Allergies  Allergen Reactions   Codeine Sulfate     REACTION: rash/ welps   Crestor [Rosuvastatin Calcium]     myalgia   Other     Wild rice   Penicillins Hives and Rash    Has patient had a PCN reaction causing immediate rash, facial/tongue/throat swelling, SOB or lightheadedness with hypotension: Yes Has patient had a PCN reaction causing severe rash involving mucus membranes or skin necrosis: No Has patient had a PCN reaction that required hospitalization: No Has patient had a PCN reaction occurring within the last 10 years: Yes If all of the above answers are "NO", then may proceed with Cephalosporin use.    Past Medical History:  Diagnosis Date   Atrial fibrillation ([place]) 2010   One time during hospital  while sick with severe diarrhea   C. difficile colitis    3/10  Severe C. dif ---had brief atrial fib then. Cath shows some blockages but no intervention   CAD (coronary artery disease) 2010   minor blockages --no intervention indicated   Hx of colonic polyps    Hyperlipidemia    Hypertension     Past Surgical History:  Procedure Laterality Date   CHOLECYSTECTOMY  3/16   ERCP N/A 01/14/2015   Procedure: ENDOSCOPIC RETROGRADE CHOLANGIOPANCREATOGRAPHY (ERCP);  Surgeon: Lucilla Lame, MD;  Location: [facility] ENDOSCOPY;  Service: Endoscopy;  Laterality: N/A;   TONSILLECTOMY AND ADENOIDECTOMY      Family History  Problem Relation Age of Onset   COPD Mother    Coronary artery disease Neg Hx    Diabetes Neg Hx    Cancer Neg Hx        breast or colon cancer    Social History   Socioeconomic History   Marital status: Widowed    Spouse name: Not on file   Number of children: 3   Years of education: Not on file   Highest education level: Not on file  Occupational History   Occupation: retired 1st grade teacher  Tobacco Use   Smoking status: Former    Types: Cigarettes    Quit date: 08/16/1978    Years since quitting: 43.9    Passive  exposure: Never   Smokeless tobacco: Never  Vaping Use   Vaping Use: Never used  Substance and Sexual Activity   Alcohol use: Never   Drug use: No   Sexual activity: Not on file  Other Topics Concern   Not on file  Social History Narrative   Has living will.    Son Mikki Santee (MD) to make health care decisions.--then son Laurey Arrow   Has DNR order in past and requests again--done   No tube feeds if cognitively unaware         Social Determinants of Health   Financial Resource Strain: Not on file  Food Insecurity: No Food Insecurity (07/05/2022)   Hunger Vital Sign    Worried About Running Out of Food in the Last Year: Never true    [place] in the Last Year: Never true  Transportation Needs: No Transportation Needs (07/05/2022)   PRAPARE -  Hydrologist (Medical): No    Lack of Transportation (Non-Medical): No  Physical Activity: Not on file  Stress: Not on file  Social Connections: Not on file  Intimate Partner Violence: Not At Risk (07/05/2022)   Humiliation, Afraid, Rape, and Kick questionnaire    Fear of Current or Ex-Partner: No    Emotionally Abused: No    Physically Abused: No    Sexually Abused: No    Review of Systems Weight down slightly No cough Still easy DOE--like changing bed. No change    Objective:   Physical Exam Constitutional:      Appearance: Normal appearance.  Abdominal:     Palpations: Abdomen is soft.     Tenderness: There is no abdominal tenderness.  Neurological:     Mental Status: She is alert.            Assessment & Plan:

## 2022-07-28 NOTE — Assessment & Plan Note (Signed)
This has been recurrent Recovered from last month's hospitalization In between spells, she is completely asymptomatic Will give hydrocodone for prn use with next spell--doubt antibiotics would help May do better in respite if she is weak  PC with son Mikki Santee

## 2022-07-29 ENCOUNTER — Telehealth: Payer: Self-pay | Admitting: Internal Medicine

## 2022-07-29 NOTE — Telephone Encounter (Signed)
Patient son Khristin Keleher called in and wanted to speak with Dr. Silvio Pate in regards to his mom. He stated that he can be reached at [phone]. Thank you!

## 2022-07-29 NOTE — Telephone Encounter (Signed)
Spoke to him about contingency plans for another spell. If she is not relieved by the oral hydrocodone, she would probably have to be admitted to health care to get more aggressive treatment (to avoid hospitalization)

## 2022-07-30 ENCOUNTER — Non-Acute Institutional Stay: Payer: Medicare Other | Admitting: Hospice

## 2022-07-30 DIAGNOSIS — I509 Heart failure, unspecified: Secondary | ICD-10-CM

## 2022-07-30 DIAGNOSIS — Z515 Encounter for palliative care: Secondary | ICD-10-CM | POA: Diagnosis not present

## 2022-07-30 DIAGNOSIS — K559 Vascular disorder of intestine, unspecified: Secondary | ICD-10-CM | POA: Diagnosis not present

## 2022-07-30 DIAGNOSIS — I480 Paroxysmal atrial fibrillation: Secondary | ICD-10-CM

## 2022-07-30 DIAGNOSIS — R197 Diarrhea, unspecified: Secondary | ICD-10-CM

## 2022-07-30 NOTE — Progress Notes (Signed)
[facility] Palliative Care Consult Note Telephone: [phone]  Fax: [phone]   Date of encounter: 07/30/22 PATIENT NAME: Jamie Hodges [address] [place] [address]   [phone] (home) [phone] (work) DOB: 06-15-1928 MRN: [id number] PRIMARY CARE PROVIDER:    [name], MD,  [place] [address] [phone]  REFERRING PROVIDER:   [name], MD [address] [place],  [place] [address] [phone]  RESPONSIBLE PARTY:   Self [id number] Contact Information     Name Relation Home Work [place] Son   [phone]   Janei, Scheff   [phone]   Tayler, Heiden Relative   [phone]       TELEHEALTH VISIT STATEMENT Due to the COVID-19 crisis, this visit was done via telemedicine from my office and it was initiated and consent by this patient and or family.  I connected with patient OR PROXY by a telephone/video  and verified that I am speaking with the correct person. I discussed the limitations of evaluation and management by telemedicine. Patient/proxy expressed understanding and agreed to proceed.  NP also spoke with son Jamie Hodges who had no concerns.  Patient was okay canceling her appointment with Latoria NP 08/15/22.                                    ASSESSMENT AND PLAN / RECOMMENDATIONS:   Goals of Care: Goals include to maximize quality of life and symptom management.  Symptom Management/Plan: Ischemic enteritis: Recently admitted in the hospital and treated with IV ceftriaxone and Flagyl.  Resolved.  Continue follow-up with GI as planned.  Paroxysmal atrial fibrillation-continue  Eliquis for anticoagulation as ordered follow up with Cardiology as scheduled.  CHF: Compensated.  Reports no fluid overload, no shortness of breath. Diarrhea: no diarrhea.    Follow up Palliative Care Visit: Palliative care will continue to follow for complex medical decision making,  advance care planning, and clarification of goals. Return in 4 weeks or prn.  PPS: 50%  HOSPICE ELIGIBILITY/DIAGNOSIS: TBD  Chief Complaint: Follow-up  HISTORY OF PRESENT ILLNESS:  Jamie Hodges is a 88 y.o. year old female  with paroxysmal atrial fibrillation, SVT, CAD, essential hypertension, cerebrovascular disease, CKD stage 3a, anemia of chronic disease, ischemic bowel disease, acute colonic ischemia, chronic diarrhea, vasovagal episode.  Recent hospitalization for ischemic enteritis for which she was treated and discharged.  She denies pain/discomfort.  A 10 point review of systems is negative, except for the pertinent positives and negatives detailed in the HPI. History obtained from review of EMR, discussion with primary team, and interview with family, facility staff/caregiver and/or Ms. Sautter.  I reviewed available labs, medications, imaging, studies and related documents from the EMR.  Records reviewed and summarized above.   CURRENT PROBLEM LIST:  Patient Active Problem List   Diagnosis Date Noted   Paroxysmal atrial fibrillation ([place]) 07/05/2022   Prolonged QT interval 07/05/2022   Fatigue 12/29/2021   Chronic diastolic heart failure ([place]) 08/21/2021   Atrial fibrillation ([place]) 02/26/2021   Stage 3b chronic kidney disease ([place]) 02/26/2021   Anemia of chronic disease    Ischemic enteritis ([place]) 02/18/2021   Diarrhea    Right hip pain 03/17/2020   Aortic atherosclerosis ([place]) 07/20/2019   Arachnoid cyst 06/29/2018   Cerebrovascular disease 06/29/2018   Vertigo 06/23/2018   Preventative health care 02/17/2018   IBS (irritable bowel syndrome)  05/26/2016   Counseling regarding advanced directives 02/27/2014   Benign paroxysmal positional vertigo 05/11/2013   Chronic diarrhea 06/09/2011   Essential hypertension 09/16/2009   AF (paroxysmal atrial fibrillation) ([place]) 09/16/2009   DIVERTICULOSIS, COLON 09/16/2009   Atherosclerotic heart disease of native coronary artery with  angina pectoris ([place]) 2010   PAST MEDICAL HISTORY:  Active Ambulatory Problems    Diagnosis Date Noted   Essential hypertension 09/16/2009   AF (paroxysmal atrial fibrillation) (H[facility]) 09/16/2009   DIVERTICULOSIS, COLON 09/16/2009   Chronic diarrhea 06/09/2011   Benign paroxysmal positional vertigo 05/11/2013   Counseling regarding advanced directives 02/27/2014   IBS (irritable bowel syndrome) 05/26/2016   Preventative health care 02/17/2018   Vertigo 06/23/2018   Arachnoid cyst 06/29/2018   Cerebrovascular disease 06/29/2018   Aortic atherosclerosis ([place]) 07/20/2019   Right hip pain 03/17/2020   Ischemic enteritis ([place]) 02/18/2021   Diarrhea    Anemia of chronic disease    Atrial fibrillation ([place]) 02/26/2021   Stage 3b chronic kidney disease ([place]) 02/26/2021   Atherosclerotic heart disease of native coronary artery with angina pectoris ([place]) 2010   Chronic diastolic heart failure ([place]) 08/21/2021   Fatigue 12/29/2021   Paroxysmal atrial fibrillation ([place]) 07/05/2022   Prolonged QT interval 07/05/2022   Resolved Ambulatory Problems    Diagnosis Date Noted   Hyperlipemia 09/16/2009   Acute upper respiratory infection 03/26/2010   Allergic reaction 10/24/2016   Leg cramps 03/02/2017   Right flank pain 03/09/2018   Rib pain on right side 03/09/2018   Right-sided chest wall pain 03/09/2018   Chronic renal disease, stage III ([place]) 02/23/2019   Paronychia of great toe, right 07/02/2019   Accelerated hypertension 07/20/2019   Rib contusion 07/20/2019   Wheezing 12/06/2019   Ischemic bowel disease ([place]) 02/18/2021   Acute kidney injury superimposed on CKD (HCC)    Lactic acidosis    Weakness    Syncope, vasovagal    Elevated troponin 02/26/2021   Hypokalemia 02/26/2021   Past Medical History:  Diagnosis Date   C. difficile colitis    CAD (coronary artery disease) 2010   Hx of colonic polyps    Hyperlipidemia    Hypertension    SOCIAL HX:  Social History   Tobacco  Use   Smoking status: Former    Types: Cigarettes    Quit date: 08/16/1978    Years since quitting: 43.9    Passive exposure: Never   Smokeless tobacco: Never  Substance Use Topics   Alcohol use: Never   FAMILY HX:  Family History  Problem Relation Age of Onset   COPD Mother    Coronary artery disease Neg Hx    Diabetes Neg Hx    Cancer Neg Hx        breast or colon cancer      ALLERGIES:  Allergies  Allergen Reactions   Codeine Sulfate     REACTION: rash/ welps   Crestor [Rosuvastatin Calcium]     myalgia   Other     Wild rice   Penicillins Hives and Rash    Has patient had a PCN reaction causing immediate rash, facial/tongue/throat swelling, SOB or lightheadedness with hypotension: Yes Has patient had a PCN reaction causing severe rash involving mucus membranes or skin necrosis: No Has patient had a PCN reaction that required hospitalization: No Has patient had a PCN reaction occurring within the last 10 years: Yes If all of the above answers are "NO", then may proceed with Cephalosporin use.  PERTINENT MEDICATIONS:  Outpatient Encounter Medications as of 07/30/2022  Medication Sig   acetaminophen (TYLENOL) 325 MG tablet Take 650 mg by mouth every 6 (six) hours as needed.   apixaban (ELIQUIS) 2.5 MG TABS tablet Take 1 tablet (2.5 mg total) by mouth 2 (two) times daily.   HYDROcodone-acetaminophen (NORCO/VICODIN) 5-325 MG tablet Take 1 tablet by mouth every 4 (four) hours as needed for moderate pain.   losartan (COZAAR) 100 MG tablet Take 1 tablet (100 mg total) by mouth daily.   meclizine (ANTIVERT) 25 MG tablet TAKE 1 TABLET BY MOUTH THREE TIMES DAILY AS NEEDED FOR NAUSEA AND VOMITING   senna-docusate (SENOKOT-S) 8.6-50 MG tablet Take 1 tablet by mouth at bedtime as needed for mild constipation. (Patient not taking: Reported on 07/28/2022)   TIADYLT ER 360 MG 24 hr capsule TAKE 1 CAPSULE BY MOUTH ONCE DAILY   No facility-administered encounter medications on file as  of 07/30/2022.  I spent 30 minutes providing this consultation; time includes spent with patient/family, chart review and documentation. More than 50% of the time in this consultation was spent on care coordination  Thank you for the opportunity to participate in the care of Ms. Lieb.  The palliative care team will continue to follow. Please call our office at [phone] if we can be of additional assistance.   Teodoro Spray, NP

## 2022-09-01 DIAGNOSIS — H903 Sensorineural hearing loss, bilateral: Secondary | ICD-10-CM | POA: Diagnosis not present

## 2022-09-15 ENCOUNTER — Other Ambulatory Visit: Payer: Medicare Other | Admitting: Student

## 2022-09-23 ENCOUNTER — Other Ambulatory Visit: Payer: Medicare Other | Admitting: Hospice

## 2022-09-23 DIAGNOSIS — R197 Diarrhea, unspecified: Secondary | ICD-10-CM

## 2022-09-23 DIAGNOSIS — I480 Paroxysmal atrial fibrillation: Secondary | ICD-10-CM

## 2022-09-23 DIAGNOSIS — R531 Weakness: Secondary | ICD-10-CM | POA: Diagnosis not present

## 2022-09-23 DIAGNOSIS — I509 Heart failure, unspecified: Secondary | ICD-10-CM

## 2022-09-23 DIAGNOSIS — Z515 Encounter for palliative care: Secondary | ICD-10-CM | POA: Diagnosis not present

## 2022-09-23 NOTE — Progress Notes (Signed)
[place] [facility] Consult Note Telephone: [phone]  Fax: [phone]   PATIENT NAME: Jamie Hodges [address] [place] [address]   [phone] (home) [phone] (work) DOB: 07-19-1928 MRN: [id number] PRIMARY CARE PROVIDER:    [name], MD,  [place] [address] [phone]  REFERRING PROVIDER:   [name], MD [address] [place],  [place] [address] [phone]  RESPONSIBLE PARTY:   Self [id number] Healthcare agent: Jamie Hodges. Contact Information     Name Relation Home Work Mobile   [name]   [phone]   [name], [name]   [phone]   [name], [name] Relative   [phone]      I met face to face with patient and family in the home. Palliative Care was asked to follow this patient by consultation request of  Lauree Chandler, NP to address advance care planning and complex medical decision making. This is a follow up visit.  Patient did not want NP to call daughter in law or her son Jamie Hodges, since she is doing well.  Visit consisted of counseling and education dealing with the complex and emotionally intense issues of symptom management and palliative care in the setting of serious and potentially life-threatening illness.  Patient enjoys word search/puzzles. She swims occasionally and is well rooted in  her christian spirituality.  Palliative care team will continue to support patient, patient's family, and medical team.                                  ASSESSMENT AND PLAN / RECOMMENDATIONS:   Goals of Care: Goals include to maximize quality of life and symptom management.  Symptom Management/Plan: HTN: Managed with Diltiazem and Losartan.  Paroxysmal atrial fibrillation-continue  Eliquis for anticoagulation as ordered follow up with Cardiology as scheduled.  CHF: Compensated.  No edema, no shortness of breath during visit; she reports shortness of  breath on moderate exertion like exercing or long walks. Rest periods encouraged; balance of rest and activity performance. Diarrhea: no diarrhea. Has imodium on hand. Continues on daily OTC probiotic.   Follow up Palliative Care Visit: Palliative care will continue to follow for complex medical decision making, advance care planning, and clarification of goals. Return in 4 weeks or prn.  PPS: 50%  HOSPICE ELIGIBILITY/DIAGNOSIS: TBD  Chief Complaint: Follow-up  HISTORY OF PRESENT ILLNESS:  Jamie Hodges is a 89 y.o. year old female  with paroxysmal atrial fibrillation, SVT, CAD, essential hypertension, cerebrovascular disease, CKD stage 3a, anemia of chronic disease, ischemic bowel disease, acute colonic ischemia, chronic diarrhea, vasovagal episode.  Recent hospitalization for ischemic enteritis for which she was treated and discharged.  She denies pain/discomfort.  She reports doing well overall, in no respiratory distress, denies pain/discomfort.  A 10 point review of systems is negative, except for the pertinent positives and negatives detailed in the HPI. History obtained from review of EMR, discussion with primary team, and interview with family, facility staff/caregiver and/or [name].  I reviewed available labs, medications, imaging, studies and related documents from the EMR.  Records reviewed and summarized above.   CURRENT PROBLEM LIST:  Patient Active Problem List   Diagnosis Date Noted   Paroxysmal atrial fibrillation ([facility]) 07/05/2022   Prolonged QT interval 07/05/2022   Fatigue 12/29/2021   Chronic diastolic heart failure ([facility]) 08/21/2021   Atrial fibrillation ([facility]) 02/26/2021   Stage 3b chronic  kidney disease ([place] [facility] [place]) 02/26/2021   Anemia of chronic disease    Ischemic enteritis (HCC) 02/18/2021   Diarrhea    Right hip pain 03/17/2020   Aortic atherosclerosis ([facility] [place]) 07/20/2019   Arachnoid cyst 06/29/2018   Cerebrovascular disease 06/29/2018   Vertigo 06/23/2018    Preventative health care 02/17/2018   IBS (irritable bowel syndrome) 05/26/2016   Counseling regarding advanced directives 02/27/2014   Benign paroxysmal positional vertigo 05/11/2013   Chronic diarrhea 06/09/2011   Essential hypertension 09/16/2009   AF (paroxysmal atrial fibrillation) ([facility]) 09/16/2009   DIVERTICULOSIS, COLON 09/16/2009   Atherosclerotic heart disease of native coronary artery with angina pectoris ([facility]) 2010   PAST MEDICAL HISTORY:  Active Ambulatory Problems    Diagnosis Date Noted   Essential hypertension 09/16/2009   AF (paroxysmal atrial fibrillation) (HCC) 09/16/2009   DIVERTICULOSIS, COLON 09/16/2009   Chronic diarrhea 06/09/2011   Benign paroxysmal positional vertigo 05/11/2013   Counseling regarding advanced directives 02/27/2014   IBS (irritable bowel syndrome) 05/26/2016   Preventative health care 02/17/2018   Vertigo 06/23/2018   Arachnoid cyst 06/29/2018   Cerebrovascular disease 06/29/2018   Aortic atherosclerosis ([facility]) 07/20/2019   Right hip pain 03/17/2020   Ischemic enteritis ([facility] [facility]) 02/18/2021   Diarrhea    Anemia of chronic disease    Atrial fibrillation ([place]) 02/26/2021   Stage 3b chronic kidney disease ([place]) 02/26/2021   Atherosclerotic heart disease of native coronary artery with angina pectoris ([place]) 2010   Chronic diastolic heart failure ([facility]) 08/21/2021   Fatigue 12/29/2021   Paroxysmal atrial fibrillation ([facility]) 07/05/2022   Prolonged QT interval 07/05/2022   Resolved Ambulatory Problems    Diagnosis Date Noted   Hyperlipemia 09/16/2009   Acute upper respiratory infection 03/26/2010   Allergic reaction 10/24/2016   Leg cramps 03/02/2017   Right flank pain 03/09/2018   Rib pain on right side 03/09/2018   Right-sided chest wall pain 03/09/2018   Chronic renal disease, stage III ([facility]) 02/23/2019   Paronychia of great toe, right 07/02/2019   Accelerated hypertension 07/20/2019   Rib contusion 07/20/2019   Wheezing 12/06/2019    Ischemic bowel disease ([facility]) 02/18/2021   Acute kidney injury superimposed on CKD (HCC)    Lactic acidosis    Weakness    Syncope, vasovagal    Elevated troponin 02/26/2021   Hypokalemia 02/26/2021   Past Medical History:  Diagnosis Date   C. difficile colitis    CAD (coronary artery disease) 2010   Hx of colonic polyps    Hyperlipidemia    Hypertension    SOCIAL HX:  Social History   Tobacco Use   Smoking status: Former    Types: Cigarettes    Quit date: 08/16/1978    Years since quitting: 43.9    Passive exposure: Never   Smokeless tobacco: Never  Substance Use Topics   Alcohol use: Never   FAMILY HX:  Family History  Problem Relation Age of Onset   COPD Mother    Coronary artery disease Neg Hx    Diabetes Neg Hx    Cancer Neg Hx        breast or colon cancer      ALLERGIES:  Allergies  Allergen Reactions   Codeine Sulfate     REACTION: rash/ welps   Crestor [Rosuvastatin Calcium]     myalgia   Other     Wild rice   Penicillins Hives and Rash    Has patient had a PCN reaction causing immediate rash, facial/tongue/throat swelling, SOB or  lightheadedness with hypotension: Yes Has patient had a PCN reaction causing severe rash involving mucus membranes or skin necrosis: No Has patient had a PCN reaction that required hospitalization: No Has patient had a PCN reaction occurring within the last 10 years: Yes If all of the above answers are "NO", then may proceed with Cephalosporin use.     PERTINENT MEDICATIONS:  Outpatient Encounter Medications as of 07/30/2022  Medication Sig   acetaminophen (TYLENOL) 325 MG tablet Take 650 mg by mouth every 6 (six) hours as needed.   apixaban (ELIQUIS) 2.5 MG TABS tablet Take 1 tablet (2.5 mg total) by mouth 2 (two) times daily.   HYDROcodone-acetaminophen (NORCO/VICODIN) 5-325 MG tablet Take 1 tablet by mouth every 4 (four) hours as needed for moderate pain.   losartan (COZAAR) 100 MG tablet Take 1 tablet (100 mg total) by  mouth daily.   meclizine (ANTIVERT) 25 MG tablet TAKE 1 TABLET BY MOUTH THREE TIMES DAILY AS NEEDED FOR NAUSEA AND VOMITING   senna-docusate (SENOKOT-S) 8.6-50 MG tablet Take 1 tablet by mouth at bedtime as needed for mild constipation. (Patient not taking: Reported on 07/28/2022)   TIADYLT ER 360 MG 24 hr capsule TAKE 1 CAPSULE BY MOUTH ONCE DAILY   No facility-administered encounter medications on file as of 07/30/2022.   I spent 60 minutes providing this consultation; time includes time spent with patient/family, chart review and documentation. More than 50% of the time in this consultation was spent on care coordination.  Thank you for the opportunity to participate in the care of Ms. Weberg.  The palliative care team will continue to follow. Please call our office at [phone] if we can be of additional assistance.   Teodoro Spray, NP

## 2022-11-30 NOTE — Progress Notes (Unsigned)
Cardiology Office Note    Date:  12/01/2022   ID:  Jamie Hodges, DOB 01-28-1928, MRN [id number]  PCP:  Karie Schwalbe, MD  Cardiologist:  Julien Nordmann, MD  Electrophysiologist:  None   Chief Complaint: Follow-up  History of Present Illness:   Jamie Hodges is a 89 y.o. female with history of PAF on apixaban, diastolic dysfunction, ischemic enteritis, aortic atherosclerosis, prior anemia, CKD stage III, and HTN who presents for follow-up of HFpEF and A-fib.  She was admitted to the hospital in 02/2021 with ischemic enteritis and was A-fib with RVR.  She was consulted on by outside cardiology group and managed with IV diltiazem, digoxin, and amiodarone.  Echo during the admission showed an EF of 60 to 65%, no regional wall motion abnormalities, moderate concentric LVH, grade 1 diastolic dysfunction, normal RV systolic function and ventricular cavity size, mild biatrial enlargement, mild to moderate mitral regurgitation, severe mitral annular calcification, mild aortic valve sclerosis without evidence of stenosis, and an estimated right atrial pressure of 3 mmHg.  She established care with our office in 04/2021 for ongoing management of A-fib.  She was in sinus rhythm with recommendation to continue amiodarone, metoprolol tartrate, diltiazem and apixaban.  Note indicates this Dr. Mariah Milling spoke with the patient's son, who is a physician out of state, with preference to avoid invasive/hospital-based procedures.  She was admitted in 07/2021 with ischemic enteritis complicated by SBO versus ileus.  Sinus rhythm on EKG.  Hospital course also notable for downtrending hemoglobin during the admission with an initial hemoglobin of 11.3 trending to 8.4 at time of discharge.  Follow-up hemoglobin in 10/2021 improved at 11.5.  She was last seen in the office in 04/2022 noting some exertional shortness of breath and lower extremity edema.  BP was typically running over 140 mmHg systolic at that time.  She was  maintaining sinus rhythm.  It was recommended she start losartan 50 mg daily with titration to 100 mg daily if BP remains elevated along with continuation of Cardizem CD 360 mg daily.  She was admitted to the hospital in 06/2022 with recurrent ischemic enteritis.  She declined surgical consultation.  She was treated with IV fluids, analgesics, and empiric antibiotics.  Internal medicine discontinued amiodarone given concerns for prolonged QTc on EKG dated 07/04/2022.  However, upon reviewing that EKG, QTc is unable to be calculated due to significant baseline artifact and wandering.  EKG performed 2 days later showed improved QTc of 475.  She comes in doing well from a cardiac perspective.  She is without symptoms of angina or decompensation.  She notes a long history of exertional shortness of breath when making a bed or doing household chores.  She indicates this predates her visit in our office from September 2023.  In this setting, she also notes a "heart pounding" sensation.  She does indicate she is able to swim laps in the pool at a slow pace without significant dyspnea.  No lower extremity swelling, abdominal distention, orthopnea, PND, or early satiety.  She does not add salt to food and drinks less than 2 L of liquids per day.  No falls or symptoms of hematochezia or melena.  Home BP readings have typically ranged from the 140s to 150s systolic with a range of 134-159 over 60s to 70s with heart rates in the 80s to 90s bpm.   Labs independently reviewed: 06/2022 - potassium 4.4, BUN 24, serum creatinine 1.25, Hgb 10.7, PLT 171, magnesium 2.1, albumin 4.0, AST/ALT normal  12/2021 - TSH normal, free T4 normal 06/2018 - TC 185, TG 208, HDL 51, LDL 92  Past Medical History:  Diagnosis Date   Atrial fibrillation 2010   One time during hospital while sick with severe diarrhea   C. difficile colitis    3/10  Severe C. dif ---had brief atrial fib then. Cath shows some blockages but no intervention    CAD (coronary artery disease) 2010   minor blockages --no intervention indicated   Hx of colonic polyps    Hyperlipidemia    Hypertension     Past Surgical History:  Procedure Laterality Date   CHOLECYSTECTOMY  3/16   ERCP N/A 01/14/2015   Procedure: ENDOSCOPIC RETROGRADE CHOLANGIOPANCREATOGRAPHY (ERCP);  Surgeon: Midge Minium, MD;  Location[facility];  Service: Endoscopy;  Laterality: N/A;   TONSILLECTOMY AND ADENOIDECTOMY      Current Medications: Current Meds  Medication Sig   acetaminophen (TYLENOL) 325 MG tablet Take 650 mg by mouth every 6 (six) hours as needed.   apixaban (ELIQUIS) 2.5 MG TABS tablet Take 1 tablet (2.5 mg total) by mouth 2 (two) times daily.   losartan (COZAAR) 100 MG tablet Take 1 tablet (100 mg total) by mouth daily.   meclizine (ANTIVERT) 25 MG tablet TAKE 1 TABLET BY MOUTH THREE TIMES DAILY AS NEEDED FOR NAUSEA AND VOMITING   metoprolol succinate (TOPROL-XL) 25 MG 24 hr tablet Take 1 tablet (25 mg total) by mouth daily. Take with or immediately following a meal.   TIADYLT ER 360 MG 24 hr capsule TAKE 1 CAPSULE BY MOUTH ONCE DAILY    Allergies:   Codeine sulfate, Crestor [rosuvastatin calcium], Other, and Penicillins   Social History   Socioeconomic History   Marital status: Widowed    Spouse name: Not on file   Number of children: 3   Years of education: Not on file   Highest education level: Not on file  Occupational History   Occupation: retired 1st grade teacher  Tobacco Use   Smoking status: Former    Types: Cigarettes    Quit date: 08/16/1978    Years since quitting: 44.3    Passive exposure: Never   Smokeless tobacco: Never  Vaping Use   Vaping Use: Never used  Substance and Sexual Activity   Alcohol use: Never   Drug use: No   Sexual activity: Not on file  Other Topics Concern   Not on file  Social History Narrative   Has living will.    Son [name] (MD) to make health care decisions.--then son [name]   Has DNR order in past and  requests again--done   No tube feeds if cognitively unaware         Social Determinants of Health   Financial Resource Strain: Not on file  Food Insecurity: No Food Insecurity (07/05/2022)   Hunger Vital Sign    Worried About Running Out of Food in the Last Year: Never true    Ran Out of Food in the Last Year: Never true  Transportation Needs: No Transportation Needs (07/05/2022)   PRAPARE - Administrator, Civil Service (Medical): No    Lack of Transportation (Non-Medical): No  Physical Activity: Not on file  Stress: Not on file  Social Connections: Not on file     Family History:  The patient's family history includes COPD in her mother. There is no history of Coronary artery disease, Diabetes, or Cancer.  ROS:   12-point review of systems is negative unless otherwise  noted in the HPI.   EKGs/Labs/Other Studies Reviewed:    Studies reviewed were summarized above. The additional studies were reviewed today:  2D echo 02/27/2021: 1. Left ventricular ejection fraction, by estimation, is 60 to 65%. The  left ventricle has normal function. The left ventricle has no regional  wall motion abnormalities. There is moderate concentric left ventricular  hypertrophy. Left ventricular  diastolic parameters are consistent with Grade I diastolic dysfunction  (impaired relaxation).   2. Right ventricular systolic function is normal. The right ventricular  size is normal.   3. Left atrial size was mildly dilated.   4. Right atrial size was mildly dilated.   5. The mitral valve is normal in structure. Mild to moderate mitral valve  regurgitation. No evidence of mitral stenosis. Severe mitral annular  calcification.   6. The aortic valve is normal in structure. Aortic valve regurgitation is  not visualized. Mild aortic valve sclerosis is present, with no evidence  of aortic valve stenosis.   7. The inferior vena cava is normal in size with greater than 50%  respiratory  variability, suggesting right atrial pressure of 3 mmHg.     EKG:  EKG is ordered today.  The EKG ordered today demonstrates NSR, 93 bpm, no acute ST-T changes  Recent Labs: 12/29/2021: TSH 4.39 07/04/2022: ALT 21 07/05/2022: Magnesium 2.1 07/06/2022: BUN 24; Creatinine, Ser 1.25; Hemoglobin 10.7; Platelets 171; Potassium 4.4; Sodium 139  Recent Lipid Panel    Component Value Date/Time   CHOL 185 06/24/2018 0529   TRIG 114 07/19/2021 0533   HDL 51 06/24/2018 0529   CHOLHDL 3.6 06/24/2018 0529   VLDL 42 (H) 06/24/2018 0529   LDLCALC 92 06/24/2018 0529   LDLDIRECT 161.9 01/13/2011 1220    PHYSICAL EXAM:    VS:  BP (!) 110/42 (BP Location: Left Arm, Patient Position: Sitting, Cuff Size: Normal)   Pulse 93   Ht  (1.702 m)   Wt 147 lb 9.6 oz (67 kg)   SpO2 97%   BMI 23.12 kg/m   BMI: Body mass index is 23.12 kg/m.  Physical Exam Vitals reviewed.  Constitutional:      Appearance: She is well-developed.  HENT:     Head: Normocephalic and atraumatic.  Eyes:     General:        Right eye: No discharge.        Left eye: No discharge.  Neck:     Vascular: No JVD.  Cardiovascular:     Rate and Rhythm: Normal rate and regular rhythm.     Pulses:          Posterior tibial pulses are 2+ on the right side and 2+ on the left side.     Heart sounds: Normal heart sounds, S1 normal and S2 normal. Heart sounds not distant. No midsystolic click and no opening snap. No murmur heard.    No friction rub.  Pulmonary:     Effort: Pulmonary effort is normal. No respiratory distress.     Breath sounds: Normal breath sounds. No decreased breath sounds, wheezing or rales.  Chest:     Chest wall: No tenderness.  Abdominal:     General: There is no distension.  Musculoskeletal:     Cervical back: Normal range of motion.     Right lower leg: No edema.     Left lower leg: No edema.  Skin:    General: Skin is warm and dry.     Nails: There is no  clubbing.  Neurological:     Mental  Status: She is alert and oriented to person, place, and time.  Psychiatric:        Speech: Speech normal.        Behavior: Behavior normal.        Thought Content: Thought content normal.        Judgment: Judgment normal.     Wt Readings from Last 3 Encounters:  12/01/22 147 lb 9.6 oz (67 kg)  07/28/22 139 lb (63 kg)  07/04/22 144 lb (65.3 kg)     ASSESSMENT & PLAN:   PAF: Maintaining sinus rhythm on Cardizem CD 360 mg.  Add Toprol-XL 25 mg given elevated BP readings at home.  No longer on amiodarone given reported prolonged QTc, however EKG dated 07/04/2022 (EKG at which time the decision to discontinue amiodarone was made by hospitalist service ) is uninterpretable with baseline artifact and wandering.  CHA2DS2-VASc 5 (HTN, age x 2, vascular disease, sex category).  She has been maintained on apixaban 2.5 mg twice daily given age of 86 and prior serum creatinine greater than 1.5 in 11/2021 through 06/2022.  However, most recent serum creatinine from 06/2022 noted to be improved at 1.25.  Weight greater than 60 kg.  Currently, the patient qualifies for apixaban 5 mg twice daily given improvement in serum creatinine.  Creatinine clearance 26.9.  Check CBC and BMP.  If her serum creatinine is greater than than 1.5, would continue her on apixaban 2.5 mg twice daily.  If, however her serum creatinine is less than 1.5, would recalculate creatinine clearance and have further discussion with the patient regarding increasing apixaban dose to 5 mg twice daily or transitioning to renally dosed rivaroxaban.  Diastolic dysfunction with exertional dyspnea: She appears euvolemic and well compensated.  Not currently requiring a standing loop diuretic.  With noted exertional dyspnea obtain echo.  She indicates she would not want to proceed with further testing outside of this.    HTN: Blood pressure is well-controlled in the office today with a repeat blood pressure 122/56.  However, home BP readings with  brachial sphygmomanometer have been on the higher side in the 140s to 150s systolic.  Add Toprol-XL 25 mg daily with continuation of Cardizem CD 360 mg daily and losartan 100 mg.  Aortic atherosclerosis: Noted on CT imaging.  LDL 92.  Not currently on statin, given advanced age this is likely reasonable.  History of anemia: Resolved on subsequent CBC.  With noted dyspnea, check CBC.   Disposition: F/u with Dr. Mariah Milling or an APP in 6 months.   Medication Adjustments/Labs and Tests Ordered: Current medicines are reviewed at length with the patient today.  Concerns regarding medicines are outlined above. Medication changes, Labs and Tests ordered today are summarized above and listed in the Patient Instructions accessible in Encounters.   Signed, Eula Listen, PA-C 12/01/2022 2:27 PM     [facility] [place] [address] [place], [place] [address] [phone]

## 2022-12-01 ENCOUNTER — Ambulatory Visit: Payer: Medicare Other | Attending: Physician Assistant | Admitting: Physician Assistant

## 2022-12-01 ENCOUNTER — Encounter: Payer: Self-pay | Admitting: Physician Assistant

## 2022-12-01 ENCOUNTER — Other Ambulatory Visit: Payer: Self-pay

## 2022-12-01 ENCOUNTER — Other Ambulatory Visit
Admission: RE | Admit: 2022-12-01 | Discharge: 2022-12-01 | Disposition: A | Discharge status: Home / Self Care | Payer: Medicare Other | Source: Ambulatory Visit | Attending: Physician Assistant | Admitting: Physician Assistant

## 2022-12-01 VITALS — BP 110/42 | HR 93 | Ht 67.0 in | Wt 147.6 lb

## 2022-12-01 DIAGNOSIS — R0609 Other forms of dyspnea: Secondary | ICD-10-CM | POA: Diagnosis not present

## 2022-12-01 DIAGNOSIS — I5189 Other ill-defined heart diseases: Secondary | ICD-10-CM | POA: Insufficient documentation

## 2022-12-01 DIAGNOSIS — I700 Atherosclerosis of aorta: Secondary | ICD-10-CM | POA: Diagnosis not present

## 2022-12-01 DIAGNOSIS — I10 Essential (primary) hypertension: Secondary | ICD-10-CM | POA: Insufficient documentation

## 2022-12-01 DIAGNOSIS — I480 Paroxysmal atrial fibrillation: Secondary | ICD-10-CM | POA: Insufficient documentation

## 2022-12-01 DIAGNOSIS — Z862 Personal history of diseases of the blood and blood-forming organs and certain disorders involving the immune mechanism: Secondary | ICD-10-CM | POA: Insufficient documentation

## 2022-12-01 DIAGNOSIS — N1832 Chronic kidney disease, stage 3b: Secondary | ICD-10-CM

## 2022-12-01 LAB — BASIC METABOLIC PANEL
Anion gap: 8 (ref 5–15)
BUN: 37 mg/dL — ABNORMAL HIGH (ref 8–23)
CO2: 25 mmol/L (ref 22–32)
Calcium: 8.6 mg/dL — ABNORMAL LOW (ref 8.9–10.3)
Chloride: 107 mmol/L (ref 98–111)
Creatinine, Ser: 1.88 mg/dL — ABNORMAL HIGH (ref 0.44–1.00)
GFR, Estimated: 25 mL/min — ABNORMAL LOW (ref >60)
Glucose, Bld: 111 mg/dL — ABNORMAL HIGH (ref 70–99)
Potassium: 5.2 mmol/L — ABNORMAL HIGH (ref 3.5–5.1)
Sodium: 140 mmol/L (ref 135–145)

## 2022-12-01 LAB — CBC
HCT: 35.1 % — ABNORMAL LOW (ref 36.0–46.0)
Hemoglobin: 11.4 g/dL — ABNORMAL LOW (ref 12.0–15.0)
MCH: 31.4 pg (ref 26.0–34.0)
MCHC: 32.5 g/dL (ref 30.0–36.0)
MCV: 96.7 fL (ref 80.0–100.0)
Platelets: 231 10*3/uL (ref 150–400)
RBC: 3.63 MIL/uL — ABNORMAL LOW (ref 3.87–5.11)
RDW: 12.7 % (ref 11.5–15.5)
WBC: 6.1 10*3/uL (ref 4.0–10.5)
nRBC: 0 % (ref 0.0–0.2)

## 2022-12-01 MED ORDER — METOPROLOL SUCCINATE ER 25 MG PO TB24: 25.0000 mg | ORAL_TABLET | Freq: Every day | ORAL | 3 refills | Status: DC

## 2022-12-01 NOTE — Patient Instructions (Signed)
Medication Instructions:  START TAKING: Metoprolol Succinate 25 mg by mouth daily  *If you need a refill on your cardiac medications before your next appointment, please call your pharmacy*   Lab Work: Your provider would like for you to have following labs drawn: (BMP, CBC).   Please go to the [facility] entrance and check in at the front desk.  You do not need an appointment.  They are open from 7am-6 pm.   If you have labs (blood work) drawn today and your tests are completely normal, you will receive your results only by: MyChart Message (if you have MyChart) OR A paper copy in the mail If you have any lab test that is abnormal or we need to change your treatment, we will call you to review the results.   Testing/Procedures: Your physician has requested that you have an echocardiogram. Echocardiography is a painless test that uses sound waves to create images of your heart. It provides your doctor with information about the size and shape of your heart and how well your heart's chambers and valves are working.   You may receive an ultrasound enhancing agent through an IV if needed to better visualize your heart during the echo. This procedure takes approximately one hour.  There are no restrictions for this procedure.  This will take place at [address] [address], [place] [address]    Follow-Up: At [facility], you and your health needs are our priority.  As part of our continuing mission to provide you with exceptional heart care, we have created designated Provider Care Teams.  These Care Teams include your primary Cardiologist (physician) and Advanced Practice Providers (APPs -  Physician Assistants and Nurse Practitioners) who all work together to provide you with the care you need, when you need it.  We recommend signing up for the patient portal called "MyChart".  Sign up information is provided on this After Visit Summary.   MyChart is used to connect with patients for Virtual Visits (Telemedicine).  Patients are able to view lab/test results, encounter notes, upcoming appointments, etc.  Non-urgent messages can be sent to your provider as well.   To learn more about what you can do with MyChart, go to [email or web address].    Your next appointment:   6 month(s)  Provider:   You may see Julien Nordmann, MD or one of the following Advanced Practice Providers on your designated Care Team:   Nicolasa Ducking, NP Eula Listen, PA-C Cadence Fransico Michael, PA-C Charlsie Quest, NP

## 2022-12-01 NOTE — Progress Notes (Signed)
Spoke with patient and informed of results via the ordering provider as follows:  Blood count is mildly low, though improved from prior and consistent with prior readings.  Renal function is elevated and consistent with a degree of dehydration.  Potassium is slightly elevated.   Recommendations:  -Increase water intake  -Recheck potassium level on 4/18, if potassium remains elevated at that time we would need to transition off losartan  -Ensure she is not using salt substitutes or seasonings that are high in potassium  -Patient is currently on the correct dose of apixaban based off age and serum creatinine greater than 1.5   Patient understood with read back

## 2022-12-01 NOTE — Progress Notes (Signed)
Spoke with patient and informed of results via the ordering provider as follows:  Blood count is mildly low, though improved from prior and consistent with prior readings.  Renal function is elevated and consistent with a degree of dehydration.  Potassium is slightly elevated.   Recommendations:  -Increase water intake  -Recheck potassium level on 4/18, if potassium remains elevated at that time we would need to transition off losartan  -Ensure she is not using salt substitutes or seasonings that are high in potassium  -Patient is currently on the correct dose of apixaban based off age and serum creatinine greater than 1.5   Patient understood with read back

## 2022-12-25 ENCOUNTER — Other Ambulatory Visit: Payer: Self-pay | Admitting: Internal Medicine

## 2022-12-27 NOTE — Telephone Encounter (Signed)
LAST APPOINTMENT DATE: 07/29/2022   NEXT APPOINTMENT DATE: 02/02/2023    LAST REFILL: 03/30/22   QTY: # 90 w/ 3 refills

## 2022-12-29 ENCOUNTER — Ambulatory Visit: Payer: Medicare Other | Attending: Physician Assistant

## 2022-12-29 DIAGNOSIS — R0609 Other forms of dyspnea: Secondary | ICD-10-CM | POA: Insufficient documentation

## 2022-12-29 LAB — ECHOCARDIOGRAM COMPLETE
AR max vel: 1.99 cm2
AV Area VTI: 1.93 cm2
AV Area mean vel: 1.98 cm2
AV Mean grad: 6 mmHg
AV Peak grad: 12.1 mmHg
Ao pk vel: 1.74 m/s
Area-P 1/2: 2.56 cm2
MV VTI: 1.64 cm2
P 1/2 time: 413 msec
S' Lateral: 2.8 cm

## 2023-01-02 ENCOUNTER — Other Ambulatory Visit: Payer: Self-pay | Admitting: Internal Medicine

## 2023-02-02 ENCOUNTER — Encounter: Payer: Self-pay | Admitting: Internal Medicine

## 2023-02-02 ENCOUNTER — Ambulatory Visit (INDEPENDENT_AMBULATORY_CARE_PROVIDER_SITE_OTHER): Payer: Medicare Other | Admitting: Internal Medicine

## 2023-02-02 VITALS — BP 132/66 | HR 88 | Temp 97.7°F | Ht 66.5 in | Wt 148.0 lb

## 2023-02-02 DIAGNOSIS — Z0000 Encounter for general adult medical examination without abnormal findings: Secondary | ICD-10-CM | POA: Diagnosis not present

## 2023-02-02 DIAGNOSIS — N184 Chronic kidney disease, stage 4 (severe): Secondary | ICD-10-CM | POA: Diagnosis not present

## 2023-02-02 DIAGNOSIS — I5032 Chronic diastolic (congestive) heart failure: Secondary | ICD-10-CM

## 2023-02-02 DIAGNOSIS — I25119 Atherosclerotic heart disease of native coronary artery with unspecified angina pectoris: Secondary | ICD-10-CM

## 2023-02-02 DIAGNOSIS — I480 Paroxysmal atrial fibrillation: Secondary | ICD-10-CM | POA: Diagnosis not present

## 2023-02-02 DIAGNOSIS — K559 Vascular disorder of intestine, unspecified: Secondary | ICD-10-CM | POA: Diagnosis not present

## 2023-02-02 LAB — RENAL FUNCTION PANEL
Albumin: 4.3 g/dL (ref 3.5–5.2)
BUN: 42 mg/dL — ABNORMAL HIGH (ref 6–23)
CO2: 26 mEq/L (ref 19–32)
Calcium: 9.1 mg/dL (ref 8.4–10.5)
Chloride: 107 mEq/L (ref 96–112)
Creatinine, Ser: 1.84 mg/dL — ABNORMAL HIGH (ref 0.40–1.20)
GFR: 23.41 mL/min — ABNORMAL LOW (ref >60.00)
Glucose, Bld: 90 mg/dL (ref 70–99)
Phosphorus: 4.7 mg/dL — ABNORMAL HIGH (ref 2.3–4.6)
Potassium: 5.2 mEq/L — ABNORMAL HIGH (ref 3.5–5.1)
Sodium: 142 mEq/L (ref 135–145)

## 2023-02-02 LAB — CBC
HCT: 36.3 % (ref 36.0–46.0)
Hemoglobin: 11.9 g/dL — ABNORMAL LOW (ref 12.0–15.0)
MCHC: 32.8 g/dL (ref 30.0–36.0)
MCV: 97.5 fl (ref 78.0–100.0)
Platelets: 221 10*3/uL (ref 150.0–400.0)
RBC: 3.73 Mil/uL — ABNORMAL LOW (ref 3.87–5.11)
RDW: 13.9 % (ref 11.5–15.5)
WBC: 5.5 10*3/uL (ref 4.0–10.5)

## 2023-02-02 LAB — LIPID PANEL
Cholesterol: 218 mg/dL — ABNORMAL HIGH (ref 0–200)
HDL: 63.7 mg/dL (ref >39.00)
LDL Cholesterol: 140 mg/dL — ABNORMAL HIGH (ref 0–99)
NonHDL: 154.26
Total CHOL/HDL Ratio: 3
Triglycerides: 70 mg/dL (ref 0.0–149.0)
VLDL: 14 mg/dL (ref 0.0–40.0)

## 2023-02-02 LAB — HEPATIC FUNCTION PANEL
ALT: 10 U/L (ref 0–35)
AST: 17 U/L (ref 0–37)
Albumin: 4.3 g/dL (ref 3.5–5.2)
Alkaline Phosphatase: 90 U/L (ref 39–117)
Bilirubin, Direct: 0.1 mg/dL (ref 0.0–0.3)
Total Bilirubin: 0.5 mg/dL (ref 0.2–1.2)
Total Protein: 7.1 g/dL (ref 6.0–8.3)

## 2023-02-02 LAB — VITAMIN D 25 HYDROXY (VIT D DEFICIENCY, FRACTURES): VITD: 16.16 ng/mL — ABNORMAL LOW (ref 30.00–100.00)

## 2023-02-02 LAB — TSH: TSH: 2.24 u[IU]/mL (ref 0.35–5.50)

## 2023-02-02 NOTE — Assessment & Plan Note (Signed)
No symptoms Asked her to avoid all OTC NSAIDs Would not want dialysis

## 2023-02-02 NOTE — Assessment & Plan Note (Signed)
Has been quiet Might be better with the diltiazem (vasodilation?)

## 2023-02-02 NOTE — Progress Notes (Signed)
Subjective:    Patient ID: Jamie Hodges, female    DOB: [birth year], 89 y.o.   MRN: [id number]  HPI Here for Medicare wellness visit and follow up of chronic health conditions Reviewed advanced directives Reviewed other doctors---Dr Pelletier--audiology, Dr Gollan-cardiology, [facility], Dr Sharyn Lull No hospitalization or surgery in the past year Does swim regularly Vision is changing--needs recheck Hearing aides do help No alcohol or tobacco No falls No depression or anhedonia Independent with instrumental ADLs No memory problems  Gets tired very easily Will be panting after carrying in groceries Wonders about all the medications---wishes she didn't need it No chest pain Palpitations only if exerting (changing bed, etc) No edema Sleeps flat in bed--no PND  No abdominal pain Still careful about eating---but has expanded her diet  Last GFR 25  Current Outpatient Medications on File Prior to Visit  Medication Sig Dispense Refill   acetaminophen (TYLENOL) 325 MG tablet Take 650 mg by mouth every 6 (six) hours as needed.     apixaban (ELIQUIS) 2.5 MG TABS tablet Take 1 tablet by mouth twice daily 60 tablet 11   diltiazem (TIAZAC) 360 MG 24 hr capsule Take 1 tablet by mouth once daily 90 capsule 3   losartan (COZAAR) 100 MG tablet Take 1 tablet (100 mg total) by mouth daily. 90 tablet 3   meclizine (ANTIVERT) 25 MG tablet TAKE 1 TABLET BY MOUTH THREE TIMES DAILY AS NEEDED FOR NAUSEA AND VOMITING 60 tablet 0   metoprolol succinate (TOPROL-XL) 25 MG 24 hr tablet Take 1 tablet (25 mg total) by mouth daily. Take with or immediately following a meal. 90 tablet 3   senna-docusate (SENOKOT-S) 8.6-50 MG tablet Take 1 tablet by mouth at bedtime as needed for mild constipation. 30 tablet 0   No current facility-administered medications on file prior to visit.    Allergies  Allergen Reactions   Codeine Sulfate     REACTION: rash/ welps   Crestor [Rosuvastatin Calcium]      myalgia   Other     Wild rice   Penicillins Hives and Rash    Has patient had a PCN reaction causing immediate rash, facial/tongue/throat swelling, SOB or lightheadedness with hypotension: Yes Has patient had a PCN reaction causing severe rash involving mucus membranes or skin necrosis: No Has patient had a PCN reaction that required hospitalization: No Has patient had a PCN reaction occurring within the last 10 years: Yes If all of the above answers are "NO", then may proceed with Cephalosporin use.    Past Medical History:  Diagnosis Date   Atrial fibrillation (HCC) 2010   One time during hospital while sick with severe diarrhea   C. difficile colitis    3/10  Severe C. dif ---had brief atrial fib then. Cath shows some blockages but no intervention   CAD (coronary artery disease) 2010   minor blockages --no intervention indicated   Hx of colonic polyps    Hyperlipidemia    Hypertension     Past Surgical History:  Procedure Laterality Date   CHOLECYSTECTOMY  3/16   ERCP N/A 01/14/2015   Procedure: ENDOSCOPIC RETROGRADE CHOLANGIOPANCREATOGRAPHY (ERCP);  Surgeon: Midge Minium, MD;  Location: [facility] ENDOSCOPY;  Service: Endoscopy;  Laterality: N/A;   TONSILLECTOMY AND ADENOIDECTOMY      Family History  Problem Relation Age of Onset   COPD Mother    Coronary artery disease Neg Hx    Diabetes Neg Hx    Cancer Neg Hx  breast or colon cancer    Social History   Socioeconomic History   Marital status: Widowed    Spouse name: Not on file   Number of children: 3   Years of education: Not on file   Highest education level: Not on file  Occupational History   Occupation: retired 1st grade teacher  Tobacco Use   Smoking status: Former    Types: Cigarettes    Quit date: 08/16/1978    Years since quitting: 44.4    Passive exposure: Never   Smokeless tobacco: Never  Vaping Use   Vaping Use: Never used  Substance and Sexual Activity   Alcohol use: Never   Drug use: No    Sexual activity: Not on file  Other Topics Concern   Not on file  Social History Narrative   Has living will.    Son [name] (MD) to make health care decisions.--then son [name]   Has DNR order in past and requests again--done   No tube feeds if cognitively unaware         Social Determinants of Health   Financial Resource Strain: Not on file  Food Insecurity: No Food Insecurity (07/05/2022)   Hunger Vital Sign    Worried About Running Out of Food in the Last Year: Never true    Ran Out of Food in the Last Year: Never true  Transportation Needs: No Transportation Needs (07/05/2022)   PRAPARE - Administrator, Civil Service (Medical): No    Lack of Transportation (Non-Medical): No  Physical Activity: Not on file  Stress: Not on file  Social Connections: Not on file  Intimate Partner Violence: Not At Risk (07/05/2022)   Humiliation, Afraid, Rape, and Kick questionnaire    Fear of Current or Ex-Partner: No    Emotionally Abused: No    Physically Abused: No    Sexually Abused: No   Review of Systems Sleep is variable. Uses melatonin.  Wears seat belt Teeth are okay--keeps up with dentist Non suspicious skin lesions Notes increased urination--more frequent nocturia also No dysuria or hematuria No sig back or joint pains No heartburn or dysphagia Bowels move fine--no blood    Objective:   Physical Exam Constitutional:      Appearance: Normal appearance.  HENT:     Mouth/Throat:     Pharynx: No oropharyngeal exudate or posterior oropharyngeal erythema.  Eyes:     Conjunctiva/sclera: Conjunctivae normal.     Pupils: Pupils are equal, round, and reactive to light.  Cardiovascular:     Rate and Rhythm: Normal rate and regular rhythm.     Heart sounds: No murmur heard.    No gallop.     Comments: Feet warm without palpable pulses Pulmonary:     Effort: Pulmonary effort is normal.     Breath sounds: Normal breath sounds. No wheezing or rales.  Abdominal:      Palpations: Abdomen is soft.     Tenderness: There is no abdominal tenderness.  Musculoskeletal:     Cervical back: Neck supple.     Right lower leg: No edema.     Left lower leg: No edema.  Lymphadenopathy:     Cervical: No cervical adenopathy.  Skin:    Findings: No rash.  Neurological:     General: No focal deficit present.     Mental Status: She is alert and oriented to person, place, and time.     Comments: Word naming--- 13/30 seconds Recall 3/3  Psychiatric:  Mood and Affect: Mood normal.        Behavior: Behavior normal.            Assessment & Plan:

## 2023-02-02 NOTE — Progress Notes (Signed)
Vision Screening   Right eye Left eye Both eyes  Without correction 20/40 20/40 20/40   With correction     Hearing Screening - Comments:: Has hearing aids. Wearing them today.

## 2023-02-02 NOTE — Assessment & Plan Note (Signed)
I have personally reviewed the Medicare Annual Wellness questionnaire and have noted 1. The patient's medical and social history 2. Their use of alcohol, tobacco or illicit drugs 3. Their current medications and supplements 4. The patient's functional ability including ADL's, fall risks, home safety risks and hearing or visual             impairment. 5. Diet and physical activities 6. Evidence for depression or mood disorders  The patients weight, height, BMI and visual acuity have been recorded in the chart I have made referrals, counseling and provided education to the patient based review of the above and I have provided the pt with a written personalized care plan for preventive services.  I have provided you with a copy of your personalized plan for preventive services. Please take the time to review along with your updated medication list.  Doing well Does exercise No cancer screening due to age Update COVID/flu and take RSV this fall

## 2023-02-02 NOTE — Assessment & Plan Note (Signed)
Regular on the diltiazem 360 daily and metoprolol On eliquis

## 2023-02-02 NOTE — Assessment & Plan Note (Signed)
Has mild blockages---stable anginal equivalent pattern  On losartan 100, metoprolol 25 and eliquis2.5mg  bid

## 2023-02-02 NOTE — Assessment & Plan Note (Signed)
Compensated now On losartan 100mg 

## 2023-02-02 NOTE — Addendum Note (Signed)
Addended by: Donnamarie Poag on: 02/02/2023 12:49 PM   Modules accepted: Orders

## 2023-02-03 LAB — PARATHYROID HORMONE, INTACT (NO CA): PTH: 63 pg/mL (ref 16–77)

## 2023-02-11 DIAGNOSIS — H5212 Myopia, left eye: Secondary | ICD-10-CM | POA: Diagnosis not present

## 2023-02-11 DIAGNOSIS — Z961 Presence of intraocular lens: Secondary | ICD-10-CM | POA: Diagnosis not present

## 2023-02-11 DIAGNOSIS — H52223 Regular astigmatism, bilateral: Secondary | ICD-10-CM | POA: Diagnosis not present

## 2023-02-11 DIAGNOSIS — H02135 Senile ectropion of left lower eyelid: Secondary | ICD-10-CM | POA: Diagnosis not present

## 2023-02-11 DIAGNOSIS — Z9889 Other specified postprocedural states: Secondary | ICD-10-CM | POA: Diagnosis not present

## 2023-03-17 ENCOUNTER — Telehealth: Payer: Self-pay | Admitting: Internal Medicine

## 2023-03-17 NOTE — Telephone Encounter (Signed)
Faxed form back.

## 2023-03-17 NOTE — Telephone Encounter (Signed)
Received a cal from Dr. Tammy Sours Manning's office regarding a clearance form for patient. States she is supposed to have extractions done and they would like to know if she needs to be taken off her eliquis. I did not see in patient's chart or [facility] so wanted to follow up on this. If needed, number to office is [phone].

## 2023-04-05 DIAGNOSIS — I480 Paroxysmal atrial fibrillation: Secondary | ICD-10-CM | POA: Diagnosis not present

## 2023-04-05 DIAGNOSIS — Z9189 Other specified personal risk factors, not elsewhere classified: Secondary | ICD-10-CM | POA: Diagnosis not present

## 2023-04-05 DIAGNOSIS — Z515 Encounter for palliative care: Secondary | ICD-10-CM | POA: Diagnosis not present

## 2023-04-05 DIAGNOSIS — I5021 Acute systolic (congestive) heart failure: Secondary | ICD-10-CM | POA: Diagnosis not present

## 2023-04-05 DIAGNOSIS — Z7189 Other specified counseling: Secondary | ICD-10-CM | POA: Diagnosis not present

## 2023-04-20 ENCOUNTER — Other Ambulatory Visit: Payer: Self-pay | Admitting: Internal Medicine

## 2023-05-13 DIAGNOSIS — Z23 Encounter for immunization: Secondary | ICD-10-CM | POA: Diagnosis not present

## 2023-05-28 ENCOUNTER — Other Ambulatory Visit: Payer: Self-pay | Admitting: Cardiovascular Disease

## 2023-06-09 DIAGNOSIS — Z23 Encounter for immunization: Secondary | ICD-10-CM | POA: Diagnosis not present

## 2023-08-21 ENCOUNTER — Other Ambulatory Visit: Payer: Self-pay | Admitting: Cardiovascular Disease

## 2023-08-23 NOTE — Telephone Encounter (Signed)
 Hi Lauren,  Please outreach patient to schedule OD follow up.  Thank you,  Ferne Coe

## 2023-08-25 NOTE — Telephone Encounter (Signed)
 Last office visit:  12/01/22 with plan to f/u  in 6 months  Next office visit:  none/does have active recall

## 2023-08-30 ENCOUNTER — Telehealth: Payer: Self-pay | Admitting: Cardiovascular Disease

## 2023-08-30 NOTE — Telephone Encounter (Signed)
 Left voicemail, pt needs appt scheduled from recall

## 2023-09-01 NOTE — Telephone Encounter (Signed)
Pt is scheduled on 2/12

## 2023-09-17 ENCOUNTER — Other Ambulatory Visit: Payer: Self-pay | Admitting: Cardiovascular Disease

## 2023-09-27 NOTE — Progress Notes (Unsigned)
Cardiology Office Note    Date:  09/28/2023   ID:  Catalina Salasar, DOB 1927/11/09, MRN [id number]  PCP:  Karie Schwalbe, MD  Cardiologist:  Julien Nordmann, MD  Electrophysiologist:  None   Chief Complaint: Follow-up  History of Present Illness:   Raynelle Fujikawa is a 89 y.o. female with history of PAF on apixaban, diastolic dysfunction, valvular heart disease with mild mitral regurgitation and stenosis as well as mild to moderate aortic insufficiency, ischemic enteritis, aortic atherosclerosis, anemia, CKD stage III, and HTN who presents for follow-up of HFpEF and A-fib.   She was admitted to the hospital in 02/2021 with ischemic enteritis and was A-fib with RVR.  She was consulted on by outside cardiology group and managed with IV diltiazem, digoxin, and amiodarone.  Echo during the admission showed an EF of 60 to 65%, no regional wall motion abnormalities, moderate concentric LVH, grade 1 diastolic dysfunction, normal RV systolic function and ventricular cavity size, mild biatrial enlargement, mild to moderate mitral regurgitation, severe mitral annular calcification, mild aortic valve sclerosis without evidence of stenosis, and an estimated right atrial pressure of 3 mmHg.   She established care with our office in 04/2021 for ongoing management of A-fib.  She was in sinus rhythm with recommendation to continue amiodarone, metoprolol tartrate, diltiazem and apixaban.  Note indicates that Dr. Mariah Milling spoke with the patient's son, who is a physician out of state, with preference to avoid invasive/hospital-based procedures.   She was admitted in 07/2021 with ischemic enteritis complicated by SBO versus ileus.  Sinus rhythm on EKG.  Hospital course also notable for downtrending hemoglobin during the admission with an initial hemoglobin of 11.3 trending to 8.4 at time of discharge.  Follow-up hemoglobin in 10/2021 improved at 11.5.   She was admitted to the hospital in 06/2022 with recurrent ischemic  enteritis.  She declined surgical consultation.  She was treated with IV fluids, analgesics, and empiric antibiotics.  Internal medicine discontinued amiodarone given concerns for prolonged QTc on EKG dated 07/04/2022.  However, upon reviewing that EKG, QTc was unable to be calculated due to significant baseline artifact and wandering.  EKG performed 2 days later showed improved QTc of 475.  She was last seen in the office in 11/2022 and was doing well from a cardiac perspective.  She was able to swim laps in the pool at a slow pace without significant dyspnea, though reported a long history of exertional shortness of breath when making the bed or doing household chores.  In the setting of this shortness of breath, she underwent echo in 12/2022 that showed an EF of 55 to 60%, no regional wall motion abnormalities, mild LVH, grade 1 diastolic dysfunction, normal RV systolic function and ventricular cavity size, mildly dilated left atrium, degenerative mitral valve with mild regurgitation and mild stenosis, mild to moderate aortic insufficiency, aortic valve sclerosis without evidence of stenosis, and an estimated right atrial pressure of 3 mmHg.  She comes in today and is without symptoms of angina or cardiac decompensation.  She continues to note shortness of breath with activities such as cleaning the house or making the bed.  She feels like this dyspnea is more pronounced than what it was in 2024.  She is able to swim 20 laps in the pool at a slow pace 3 days/week without symptoms of dyspnea.  No symptoms of chest pain.  At times, she does note palpitations with her dyspneic episodes.  No falls, hematochezia, or melena.  She does  note some nuisance bruising on apixaban.  Reports her blood pressure cuff at home is not currently working.  Weight stable.  No lower extremity swelling or progressive orthopnea.  Does not want to pursue cardiac testing beyond echo.   Labs independently reviewed: 01/2023 - TSH  normal, TC 218, TG 70, HDL 63, LDL 140, Hgb 11.9, PLT 221, albumin 4.3, AST/ALT normal, potassium 5.2, BUN 42, serum creatinine 1.84  Past Medical History:  Diagnosis Date   Atrial fibrillation (HCC) 2010   One time during hospital while sick with severe diarrhea   C. difficile colitis    3/10  Severe C. dif ---had brief atrial fib then. Cath shows some blockages but no intervention   CAD (coronary artery disease) 2010   minor blockages --no intervention indicated   Hx of colonic polyps    Hyperlipidemia    Hypertension     Past Surgical History:  Procedure Laterality Date   CHOLECYSTECTOMY  3/16   ERCP N/A 01/14/2015   Procedure: ENDOSCOPIC RETROGRADE CHOLANGIOPANCREATOGRAPHY (ERCP);  Surgeon: Midge Minium, MD;  Location: [facility] ENDOSCOPY;  Service: Endoscopy;  Laterality: N/A;   TONSILLECTOMY AND ADENOIDECTOMY      Current Medications: Current Meds  Medication Sig   acetaminophen (TYLENOL) 325 MG tablet Take 650 mg by mouth every 6 (six) hours as needed.   apixaban (ELIQUIS) 2.5 MG TABS tablet Take 1 tablet by mouth twice daily   diltiazem (TIAZAC) 360 MG 24 hr capsule Take 1 tablet by mouth once daily   losartan (COZAAR) 100 MG tablet TAKE 1 TABLET BY MOUTH ONCE DAILY . APPOINTMENT REQUIRED FOR FUTURE REFILLS. OVERDUE FOR OFFICE VISIT   meclizine (ANTIVERT) 25 MG tablet TAKE 1 TABLET BY MOUTH THREE TIMES DAILY AS NEEDED FOR NAUSEA AND VOMITING   metoprolol succinate (TOPROL-XL) 25 MG 24 hr tablet Take 1 tablet (25 mg total) by mouth daily. Take with or immediately following a meal.   senna-docusate (SENOKOT-S) 8.6-50 MG tablet Take 1 tablet by mouth at bedtime as needed for mild constipation.    Allergies:   Codeine sulfate, Crestor [rosuvastatin calcium], Other, and Penicillins   Social History   Socioeconomic History   Marital status: Widowed    Spouse name: Not on file   Number of children: 3   Years of education: Not on file   Highest education level: Not on file   Occupational History   Occupation: retired 1st grade teacher  Tobacco Use   Smoking status: Former    Current packs/day: 0.00    Types: Cigarettes    Quit date: 08/16/1978    Years since quitting: 45.1    Passive exposure: Never   Smokeless tobacco: Never  Vaping Use   Vaping status: Never Used  Substance and Sexual Activity   Alcohol use: Never   Drug use: No   Sexual activity: Not on file  Other Topics Concern   Not on file  Social History Narrative   Has living will.    Son [name] (MD) to make health care decisions.--then son [name]   Has DNR order in past and requests again--done   No tube feeds if cognitively unaware         Social Drivers of Health   Financial Resource Strain: Not on file  Food Insecurity: No Food Insecurity (07/05/2022)   Hunger Vital Sign    Worried About Running Out of Food in the Last Year: Never true    Ran Out of Food in the Last Year: Never true  Transportation Needs: No Transportation Needs (07/05/2022)   PRAPARE - Administrator, Civil Service (Medical): No    Lack of Transportation (Non-Medical): No  Physical Activity: Not on file  Stress: Not on file  Social Connections: Not on file     Family History:  The patient's family history includes COPD in her mother. There is no history of Coronary artery disease, Diabetes, or Cancer.  ROS:   12-point review of systems is negative unless otherwise noted in the HPI.   EKGs/Labs/Other Studies Reviewed:    Studies reviewed were summarized above. The additional studies were reviewed today:  2D echo 12/29/2022: 1. Left ventricular ejection fraction, by estimation, is 55 to 60%. Left  ventricular ejection fraction by PLAX is 58 %. The left ventricle has  normal function. The left ventricle has no regional wall motion  abnormalities. There is mild left ventricular  hypertrophy. Left ventricular diastolic parameters are consistent with  Grade I diastolic dysfunction (impaired  relaxation).   2. Right ventricular systolic function is normal. The right ventricular  size is normal.   3. Left atrial size was mildly dilated.   4. Mean mitral valve gradient .. The mitral valve is degenerative.  Mild mitral valve regurgitation. Mild mitral stenosis.   5. The aortic valve is tricuspid. Aortic valve regurgitation is mild to  moderate. Aortic valve sclerosis/calcification is present, without any  evidence of aortic stenosis.   6. The inferior vena cava is normal in size with greater than 50%  respiratory variability, suggesting right atrial pressure of 3 mmHg.   Comparison(s): 02/27/21: 60-65% mild LVH/MR, [facility]  __________  2D echo 02/27/2021: 1. Left ventricular ejection fraction, by estimation, is 60 to 65%. The  left ventricle has normal function. The left ventricle has no regional  wall motion abnormalities. There is moderate concentric left ventricular  hypertrophy. Left ventricular  diastolic parameters are consistent with Grade I diastolic dysfunction  (impaired relaxation).   2. Right ventricular systolic function is normal. The right ventricular  size is normal.   3. Left atrial size was mildly dilated.   4. Right atrial size was mildly dilated.   5. The mitral valve is normal in structure. Mild to moderate mitral valve  regurgitation. No evidence of mitral stenosis. Severe mitral annular  calcification.   6. The aortic valve is normal in structure. Aortic valve regurgitation is  not visualized. Mild aortic valve sclerosis is present, with no evidence  of aortic valve stenosis.   7. The inferior vena cava is normal in size with greater than 50%  respiratory variability, suggesting right atrial pressure of 3 mmHg.   EKG:  EKG is ordered today.  The EKG ordered today demonstrates NSR, 83 bpm, 1st degree AV block, no acute ST-T changes, consistent with prior tracing  Recent Labs: 02/02/2023: ALT 10; BUN 42; Creatinine, Ser 1.84; Hemoglobin 11.9;  Platelets 221.0; Potassium 5.2 No hemolysis seen; Sodium 142; TSH 2.24  Recent Lipid Panel    Component Value Date/Time   CHOL 218 (H) 02/02/2023 1128   TRIG 70.0 02/02/2023 1128   HDL 63.70 02/02/2023 1128   CHOLHDL 3 02/02/2023 1128   VLDL 14.0 02/02/2023 1128   LDLCALC 140 (H) 02/02/2023 1128   LDLDIRECT 161.9 01/13/2011 1220    PHYSICAL EXAM:    VS:  BP (!) 140/58 (BP Location: Left Arm, Patient Position: Sitting, Cuff Size: Normal)   Pulse 83   Ht 5\' 7"  (1.702 m)   Wt 150 lb 6.4 oz (  68.2 kg)   SpO2 98%   BMI 23.56 kg/m   BMI: Body mass index is 23.56 kg/m.  Physical Exam Vitals reviewed.  Constitutional:      Appearance: She is well-developed.  HENT:     Head: Normocephalic and atraumatic.  Eyes:     General:        Right eye: No discharge.        Left eye: No discharge.  Cardiovascular:     Rate and Rhythm: Normal rate and regular rhythm.     Heart sounds: S1 normal and S2 normal. Heart sounds not distant. No midsystolic click and no opening snap. Murmur heard.     Systolic murmur is present with a grade of 2/6 at the apex.     No friction rub.  Pulmonary:     Effort: Pulmonary effort is normal. No respiratory distress.     Breath sounds: Normal breath sounds. No decreased breath sounds, wheezing, rhonchi or rales.  Chest:     Chest wall: No tenderness.  Musculoskeletal:     Cervical back: Normal range of motion.     Right lower leg: No edema.     Left lower leg: No edema.  Skin:    General: Skin is warm and dry.     Nails: There is no clubbing.  Neurological:     Mental Status: She is alert and oriented to person, place, and time.  Psychiatric:        Speech: Speech normal.        Behavior: Behavior normal.        Thought Content: Thought content normal.        Judgment: Judgment normal.     Wt Readings from Last 3 Encounters:  09/28/23 150 lb 6.4 oz (68.2 kg)  02/02/23 148 lb (67.1 kg)  12/01/22 147 lb 9.6 oz (67 kg)     ASSESSMENT & PLAN:    PAF: Maintaining sinus rhythm on diltiazem 360 mg and Toprol-XL 25 mg.  No longer on amiodarone given reported prolonged QTc, however EKG dated 07/04/2022 (EKG at which time the decision to discontinue amiodarone was made by hospitalist service ) is uninterpretable with baseline artifact and wandering.  Without documented evidence of A-fib recurrence, defer AAT at this time.  CHA2DS2-VASc 5 (HTN, age x 2, vascular disease, sex category).  On apixaban 2.5 mg twice daily (age and serum creatinine).  Check BMP and CBC.  Diastolic dysfunction: Appears euvolemic and well compensated.  Not requiring a standing loop diuretic.  Schedule echo.  Exertional dyspnea: Noted with activities such as making the bed or doing household chores.  No dyspnea is associated with swimming 20 laps at a slow pace in the pool 3 days/week.  Symptoms are chronic, though more progressive.  She again indicates that she would not want to proceed with cardiac testing beyond echo, this includes declination of noninvasive and invasive ischemic cardiac testing.  Check CBC to ensure stable hemoglobin.  Obtain echo.  HTN: Blood pressure is reasonably controlled in the office today.  She remains on losartan 100 mg, long-acting diltiazem 360 mg, and Toprol-XL 25 mg.  Low-sodium diet encouraged.  Valvular heart disease: Echo in 12/2022 with mild mitral regurgitation and mild mitral stenosis with mild to moderate aortic valve insufficiency with aortic valve sclerosis without evidence of stenosis.  With ongoing and progressive exertional dyspnea, trend echo.  Aortic atherosclerosis: Noted on CT. not currently on a statin, given advanced age this is likely reasonable.  Disposition: F/u with Dr. Mariah Milling or an APP in 6 months.   Medication Adjustments/Labs and Tests Ordered: Current medicines are reviewed at length with the patient today.  Concerns regarding medicines are outlined above. Medication changes, Labs and Tests ordered today  are summarized above and listed in the Patient Instructions accessible in Encounters.   Signed, Eula Listen, PA-C 09/28/2023 2:25 PM     [facility] [place] [address] [place], [place] [address] [phone]

## 2023-09-28 ENCOUNTER — Encounter: Payer: Self-pay | Admitting: Physician Assistant

## 2023-09-28 ENCOUNTER — Ambulatory Visit: Payer: Medicare Other | Attending: Physician Assistant | Admitting: Physician Assistant

## 2023-09-28 VITALS — BP 140/58 | HR 83 | Ht 67.0 in | Wt 150.4 lb

## 2023-09-28 DIAGNOSIS — R0609 Other forms of dyspnea: Secondary | ICD-10-CM | POA: Insufficient documentation

## 2023-09-28 DIAGNOSIS — I700 Atherosclerosis of aorta: Secondary | ICD-10-CM | POA: Insufficient documentation

## 2023-09-28 DIAGNOSIS — I5189 Other ill-defined heart diseases: Secondary | ICD-10-CM | POA: Insufficient documentation

## 2023-09-28 DIAGNOSIS — I480 Paroxysmal atrial fibrillation: Secondary | ICD-10-CM | POA: Diagnosis present

## 2023-09-28 DIAGNOSIS — I050 Rheumatic mitral stenosis: Secondary | ICD-10-CM | POA: Diagnosis present

## 2023-09-28 DIAGNOSIS — I10 Essential (primary) hypertension: Secondary | ICD-10-CM | POA: Diagnosis not present

## 2023-09-28 NOTE — Patient Instructions (Signed)
Medication Instructions:  Your Physician recommend you continue on your current medication as directed.    *If you need a refill on your cardiac medications before your next appointment, please call your pharmacy*   Lab Work: Your provider would like for you to have following labs drawn today CBC and BMeT.   If you have labs (blood work) drawn today and your tests are completely normal, you will receive your results only by: MyChart Message (if you have MyChart) OR A paper copy in the mail If you have any lab test that is abnormal or we need to change your treatment, we will call you to review the results.   Testing/Procedures: Your physician has requested that you have an echocardiogram. Echocardiography is a painless test that uses sound waves to create images of your heart. It provides your doctor with information about the size and shape of your heart and how well your heart's chambers and valves are working.   You may receive an ultrasound enhancing agent through an IV if needed to better visualize your heart during the echo. This procedure takes approximately one hour.  There are no restrictions for this procedure.  This will take place at [address] [facility] [address] [facility]) [address], [place] [address]  Please note: We ask at that you not bring children with you during ultrasound (echo/ vascular) testing. Due to room size and safety concerns, children are not allowed in the ultrasound rooms during exams. Our front office staff cannot provide observation of children in our lobby area while testing is being conducted. An adult accompanying a patient to their appointment will only be allowed in the ultrasound room at the discretion of the ultrasound technician under special circumstances. We apologize for any inconvenience.    Follow-Up: At [facility], you and your health needs are our priority.  As part of our continuing mission to provide you with exceptional heart care,  we have created designated Provider Care Teams.  These Care Teams include your primary Cardiologist (physician) and Advanced Practice Providers (APPs -  Physician Assistants and Nurse Practitioners) who all work together to provide you with the care you need, when you need it.  Your next appointment:   6 month(s)  Provider:   You may see Julien Nordmann, MD or one of the following Advanced Practice Providers on your designated Care Team:   Nicolasa Ducking, NP Eula Listen, PA-C Cadence Fransico Michael, PA-C Charlsie Quest, NP Carlos Levering, NP

## 2023-09-29 ENCOUNTER — Other Ambulatory Visit
Admission: RE | Admit: 2023-09-29 | Discharge: 2023-09-29 | Disposition: A | Discharge status: Home / Self Care | Payer: Medicare Other | Source: Ambulatory Visit | Attending: Physician Assistant | Admitting: Physician Assistant

## 2023-09-29 ENCOUNTER — Other Ambulatory Visit: Payer: Self-pay | Admitting: Emergency Medicine

## 2023-09-29 DIAGNOSIS — E875 Hyperkalemia: Secondary | ICD-10-CM

## 2023-09-29 DIAGNOSIS — Z79899 Other long term (current) drug therapy: Secondary | ICD-10-CM | POA: Diagnosis not present

## 2023-09-29 LAB — BASIC METABOLIC PANEL
BUN/Creatinine Ratio: 20 (ref 12–28)
BUN: 37 mg/dL — ABNORMAL HIGH (ref 10–36)
CO2: 17 mmol/L — ABNORMAL LOW (ref 20–29)
Calcium: 9.1 mg/dL (ref 8.7–10.3)
Chloride: 111 mmol/L — ABNORMAL HIGH (ref 96–106)
Creatinine, Ser: 1.81 mg/dL — ABNORMAL HIGH (ref 0.57–1.00)
Glucose: 88 mg/dL (ref 70–99)
Potassium: 5.6 mmol/L — ABNORMAL HIGH (ref 3.5–5.2)
Sodium: 142 mmol/L (ref 134–144)
eGFR: 26 mL/min/{1.73_m2} — ABNORMAL LOW (ref >59)

## 2023-09-29 LAB — CBC
Hematocrit: 36 % (ref 34.0–46.6)
Hemoglobin: 12.3 g/dL (ref 11.1–15.9)
MCH: 33.2 pg — ABNORMAL HIGH (ref 26.6–33.0)
MCHC: 34.2 g/dL (ref 31.5–35.7)
MCV: 97 fL (ref 79–97)
Platelets: 202 10*3/uL (ref 150–450)
RBC: 3.71 x10E6/uL — ABNORMAL LOW (ref 3.77–5.28)
RDW: 12.9 % (ref 11.7–15.4)
WBC: 5.3 10*3/uL (ref 3.4–10.8)

## 2023-09-29 LAB — POTASSIUM: Potassium: 5.4 mmol/L — ABNORMAL HIGH (ref 3.5–5.1)

## 2023-09-29 MED ORDER — LOKELMA 10 G PO PACK: 10.0000 g | PACK | Freq: Once | ORAL | 0 refills | Status: AC

## 2023-10-03 ENCOUNTER — Other Ambulatory Visit
Admission: RE | Admit: 2023-10-03 | Discharge: 2023-10-03 | Disposition: A | Discharge status: Home / Self Care | Payer: Medicare Other | Source: Ambulatory Visit | Attending: Physician Assistant | Admitting: Physician Assistant

## 2023-10-03 DIAGNOSIS — E875 Hyperkalemia: Secondary | ICD-10-CM | POA: Insufficient documentation

## 2023-10-03 DIAGNOSIS — Z79899 Other long term (current) drug therapy: Secondary | ICD-10-CM | POA: Diagnosis not present

## 2023-10-03 LAB — POTASSIUM: Potassium: 4.4 mmol/L (ref 3.5–5.1)

## 2023-10-19 ENCOUNTER — Ambulatory Visit: Payer: Medicare Other | Attending: Physician Assistant

## 2023-10-19 DIAGNOSIS — I050 Rheumatic mitral stenosis: Secondary | ICD-10-CM | POA: Diagnosis not present

## 2023-10-19 DIAGNOSIS — R0609 Other forms of dyspnea: Secondary | ICD-10-CM | POA: Insufficient documentation

## 2023-10-19 LAB — ECHOCARDIOGRAM COMPLETE
AV Mean grad: 8 mmHg
AV Peak grad: 14.7 mmHg
Ao pk vel: 1.92 m/s
Area-P 1/2: 2.87 cm2
S' Lateral: 3.2 cm

## 2023-11-25 DIAGNOSIS — Z23 Encounter for immunization: Secondary | ICD-10-CM | POA: Diagnosis not present

## 2023-11-30 ENCOUNTER — Telehealth: Payer: Self-pay | Admitting: Internal Medicine

## 2023-11-30 NOTE — Telephone Encounter (Signed)
 Contacted Jamie Hodges to schedule their annual wellness visit. Patient declined to schedule AWV at this time.Transferred care to [facility].   [facility] Care Guide [facility] AWV TEAM Direct Dial: [phone]

## 2024-01-04 ENCOUNTER — Ambulatory Visit: Admitting: Student

## 2024-01-04 ENCOUNTER — Encounter: Payer: Self-pay | Admitting: Student

## 2024-01-04 VITALS — BP 144/62 | HR 76 | Temp 95.5°F | Ht 67.0 in | Wt 151.2 lb

## 2024-01-04 DIAGNOSIS — N184 Chronic kidney disease, stage 4 (severe): Secondary | ICD-10-CM | POA: Diagnosis not present

## 2024-01-04 DIAGNOSIS — I25119 Atherosclerotic heart disease of native coronary artery with unspecified angina pectoris: Secondary | ICD-10-CM

## 2024-01-04 DIAGNOSIS — I10 Essential (primary) hypertension: Secondary | ICD-10-CM | POA: Diagnosis not present

## 2024-01-04 DIAGNOSIS — I5032 Chronic diastolic (congestive) heart failure: Secondary | ICD-10-CM

## 2024-01-04 DIAGNOSIS — D638 Anemia in other chronic diseases classified elsewhere: Secondary | ICD-10-CM | POA: Diagnosis not present

## 2024-01-04 DIAGNOSIS — I480 Paroxysmal atrial fibrillation: Secondary | ICD-10-CM | POA: Diagnosis not present

## 2024-01-04 DIAGNOSIS — K559 Vascular disorder of intestine, unspecified: Secondary | ICD-10-CM | POA: Diagnosis not present

## 2024-01-04 DIAGNOSIS — N39490 Overflow incontinence: Secondary | ICD-10-CM | POA: Diagnosis not present

## 2024-01-04 MED ORDER — METOPROLOL TARTRATE 25 MG PO TABS: 12.5000 mg | ORAL_TABLET | Freq: Two times a day (BID) | ORAL | 0 refills | Status: DC

## 2024-01-04 MED ORDER — METOPROLOL SUCCINATE ER 25 MG PO TB24: 12.5000 mg | ORAL_TABLET | Freq: Every day | ORAL | 0 refills | Status: DC

## 2024-01-04 NOTE — Patient Instructions (Addendum)
 VISIT SUMMARY:  Today, we discussed your shortness of breath, high blood pressure, and new onset urinary incontinence. We also reviewed your history of atrial fibrillation, chronic kidney disease, ischemic enteritis, and an arachnoid cyst. We talked about your goals of care and your preference for minimal medical interventions.  YOUR PLAN:  -ATRIAL FIBRILLATION: Atrial fibrillation is an irregular and often rapid heart rate that can lead to blood clots, stroke, heart failure, and other heart-related complications. We will transition you from metoprolol  to diltiazem  to help with your shortness of breath. You will take metoprolol  succinate 12.5 mg daily for two weeks, then stop taking it. This is the white pill that can be cut in half. Take 1/2 tablet daily from now on. We will inform Dr. Alto Atta about this change.  -HYPERTENSION: Hypertension is high blood pressure, which can lead to serious health problems like heart disease and stroke. Your blood pressure has been high since you stopped taking losartan . You should discuss resuming losartan  with Dr. Alto Atta at your upcoming appointment.  -CHRONIC KIDNEY DISEASE: Chronic kidney disease is a long-term condition where the kidneys do not work as well as they should. There were no specific changes to your management plan for this condition today.  -URINARY INCONTINENCE: Urinary incontinence is the loss of bladder control. You have been experiencing this mainly at night and are managing it with absorbent products. No further interventions are needed at this time.  -ISCHEMIC ENTERITIS: Ischemic enteritis is inflammation of the intestines due to reduced blood flow. You had this condition about a year ago, and it was managed without surgery. No current issues were noted.  -ARACHNOID CYST: An arachnoid cyst is a fluid-filled sac that can develop on the brain or spinal cord. You have one that was found in 2019, but it is not causing any symptoms or requiring any  treatment.  -GOALS OF CARE: We discussed your preference for minimal medical interventions and your desire to avoid emergency room visits. You have a DNR in place and prefer comfort measures if your health significantly declines.  INSTRUCTIONS:  Please follow up with Dr. Alto Atta as scheduled to discuss your blood pressure management and the possibility of resuming losartan . Continue taking metoprolol  12.5 mg daily for the next two weeks, then stop. We will keep Dr. Alto Atta informed about your medication changes.

## 2024-01-05 ENCOUNTER — Encounter: Payer: Self-pay | Admitting: Student

## 2024-01-05 NOTE — Progress Notes (Signed)
 [facility]: [facility] Provider: Jann Melody  Code Status: DNR Goals of Care:     01/04/2024    2:15 PM  Advanced Directives  Does Patient Have a Medical Advance Directive? Yes  Type of Estate agent of [place];Living will;Out of facility DNR (pink MOST or yellow form)  Does patient want to make changes to medical advance directive? No - Patient declined  Copy of Healthcare Power of Attorney in Chart? No - copy requested     Chief Complaint  Patient presents with   Establish Care    NP to Establish Care.     HPI: Patient is a 89 y.o. female seen today for medical management of chronic diseases.   Discussed the use of AI scribe software for clinical note transcription with the patient, who gave verbal consent to proceed.  History of Present Illness   Jamie Hodges is a 89 year old female with atrial fibrillation and hypertension who presents with shortness of breath. She is scheduled to see her cardiologist, Dr. Alto Atta, in a week.  She experiences significant shortness of breath during physical activities such as bringing in groceries from the car, which resolves quickly after sitting down. She is currently on metoprolol , which she has been on for a long time, and is aware it might be contributing to her symptoms.  Her blood pressure has been higher since discontinuing losartan , with home readings around 183/178 mmHg in the morning. She plans to discuss the possibility of resuming losartan  with her cardiologist.  She recalls an episode of ischemic enteritis about a year ago, managed without surgery. She has a DNR in place and prefers to avoid emergency room visits and extensive medical interventions.  She reports new onset urinary incontinence, primarily nocturnal, due to an inability to reach the bathroom in time because of the volume of urine. She manages this with diapers and does not experience incontinence during the day.  Her current  medications include Eliquis , diltiazem , and meclizine  as needed. She no longer takes senadocusate, preferring prune juice or a laxative from [facility] for constipation management.  She has a history of an arachnoid cyst noted on an MRI in 2019, but she has no current concerns about it.  She lives independently in a villa, drives herself, and swims three to four times a week. She uses a cane for stability in crowded places like [employer] but not at home. No falls or significant memory concerns beyond normal age-related forgetfulness.         Past Medical History:  Diagnosis Date   Atrial fibrillation (HCC) 2010   One time during hospital while sick with severe diarrhea   C. difficile colitis    3/10  Severe C. dif ---had brief atrial fib then. Cath shows some blockages but no intervention   CAD (coronary artery disease) 2010   minor blockages --no intervention indicated   Hx of colonic polyps    Hyperlipidemia    Hypertension     Past Surgical History:  Procedure Laterality Date   CHOLECYSTECTOMY  3/16   ERCP N/A 01/14/2015   Procedure: ENDOSCOPIC RETROGRADE CHOLANGIOPANCREATOGRAPHY (ERCP);  Surgeon: Marnee Sink, MD;  Location: [facility] ENDOSCOPY;  Service: Endoscopy;  Laterality: N/A;   TONSILLECTOMY AND ADENOIDECTOMY      Allergies  Allergen Reactions   Codeine Sulfate     REACTION: rash/ welps   Crestor [Rosuvastatin Calcium]     myalgia   Other     Wild rice  Penicillins Hives and Rash    Has patient had a PCN reaction causing immediate rash, facial/tongue/throat swelling, SOB or lightheadedness with hypotension: Yes Has patient had a PCN reaction causing severe rash involving mucus membranes or skin necrosis: No Has patient had a PCN reaction that required hospitalization: No Has patient had a PCN reaction occurring within the last 10 years: Yes If all of the above answers are "NO", then may proceed with Cephalosporin use.    Outpatient Encounter Medications as of 01/04/2024   Medication Sig   acetaminophen  (TYLENOL ) 325 MG tablet Take 650 mg by mouth every 6 (six) hours as needed.   apixaban  (ELIQUIS ) 2.5 MG TABS tablet Take 1 tablet by mouth twice daily   diltiazem  (TIAZAC ) 360 MG 24 hr capsule Take 1 tablet by mouth once daily   meclizine  (ANTIVERT ) 25 MG tablet TAKE 1 TABLET BY MOUTH THREE TIMES DAILY AS NEEDED FOR NAUSEA AND VOMITING   [DISCONTINUED] metoprolol  succinate (TOPROL -XL) 25 MG 24 hr tablet Take 1 tablet (25 mg total) by mouth daily. Take with or immediately following a meal.   [DISCONTINUED] metoprolol  tartrate (LOPRESSOR ) 25 MG tablet Take 0.5 tablets (12.5 mg total) by mouth 2 (two) times daily.   [DISCONTINUED] senna-docusate (SENOKOT-S) 8.6-50 MG tablet Take 1 tablet by mouth at bedtime as needed for mild constipation.   metoprolol  succinate (TOPROL -XL) 25 MG 24 hr tablet Take 0.5 tablets (12.5 mg total) by mouth daily for 14 days. Take with or immediately following a meal.   [DISCONTINUED] losartan  (COZAAR ) 100 MG tablet TAKE 1 TABLET BY MOUTH ONCE DAILY . APPOINTMENT REQUIRED FOR FUTURE REFILLS. OVERDUE FOR OFFICE VISIT (Patient not taking: Reported on 01/04/2024)   No facility-administered encounter medications on file as of 01/04/2024.    Review of Systems:  Review of Systems  Health Maintenance  Topic Date Due   DEXA SCAN  Never done   Medicare Annual Wellness (AWV)  02/02/2024   COVID-19 Vaccine (9 - 2024-25 season) 01/20/2024   INFLUENZA VACCINE  03/16/2024   DTaP/Tdap/Td (4 - Td or Tdap) 05/17/2031   Pneumonia Vaccine 42+ Years old  Completed   Zoster Vaccines- Shingrix  Completed   HPV VACCINES  Aged Out   Meningococcal B Vaccine  Aged Out    Physical Exam: Vitals:   01/04/24 1411  BP: (!) 144/62  Pulse: 76  Temp: (!) 95.5 F (35.3 C)  SpO2: 95%  Weight: 151 lb 3.2 oz (68.6 kg)  Height: 5\' 7"  (1.702 m)   Body mass index is 23.68 kg/m. Physical Exam Constitutional:      Appearance: Normal appearance.   Cardiovascular:     Rate and Rhythm: Normal rate and regular rhythm.     Pulses: Normal pulses.     Heart sounds: Normal heart sounds.  Pulmonary:     Effort: Pulmonary effort is normal.  Abdominal:     General: Abdomen is flat. Bowel sounds are normal.     Palpations: Abdomen is soft.  Musculoskeletal:        General: No swelling or tenderness.  Skin:    General: Skin is warm and dry.     Capillary Refill: Capillary refill takes less than 2 seconds.  Neurological:     Mental Status: She is alert and oriented to person, place, and time.     Gait: Gait normal.  Psychiatric:        Mood and Affect: Mood normal.    Physical Exam   CHEST: Lungs clear to auscultation bilaterally.  EXTREMITIES: No edema in extremities.      Labs reviewed: Basic Metabolic Panel: Recent Labs    02/02/23 1128 09/28/23 1403 09/29/23 1550 10/03/23 1059  NA 142 142  --   --   K 5.2 No hemolysis seen* 5.6* 5.4* 4.4  CL 107 111*  --   --   CO2 26 17*  --   --   GLUCOSE 90 88  --   --   BUN 42* 37*  --   --   CREATININE 1.84* 1.81*  --   --   CALCIUM 9.1 9.1  --   --   PHOS 4.7*  --   --   --   TSH 2.24  --   --   --    Liver Function Tests: Recent Labs    02/02/23 1128  AST 17  ALT 10  ALKPHOS 90  BILITOT 0.5  PROT 7.1  ALBUMIN 4.3  4.3   No results for input(s): "LIPASE", "AMYLASE" in the last 8760 hours. No results for input(s): "AMMONIA" in the last 8760 hours. CBC: Recent Labs    02/02/23 1128 09/28/23 1403  WBC 5.5 5.3  HGB 11.9* 12.3  HCT 36.3 36.0  MCV 97.5 97  PLT 221.0 202   Lipid Panel: Recent Labs    02/02/23 1128  CHOL 218*  HDL 63.70  LDLCALC 140*  TRIG 70.0  CHOLHDL 3   Lab Results  Component Value Date   HGBA1C 4.9 06/23/2018    Procedures since last visit: No results found. Results   RADIOLOGY Brain MRI: Arachnoid cyst (2019)  DIAGNOSTIC Echocardiogram: Unchanged from 2024 (10/2023)      Assessment/Plan    Atrial fibrillation Chronic  atrial fibrillation managed with metoprolol . Dyspnea on exertion may be exacerbated by metoprolol . Echocardiogram unchanged since 2024. Recent findings suggest metoprolol  may worsen symptoms in patients with good cardiac function. - Transition from metoprolol  to diltiazem  to address dyspnea. - Prescribe metoprolol  12.5 mg BID for two weeks, then discontinue. - Send a message to Dr. Alto Atta regarding medication change.  Hypertension Hypertension previously managed with losartan , which was discontinued. Home blood pressure readings elevated (183/178 mmHg).  Chronic kidney disease Chronic kidney disease acknowledged, no specific discussion or changes in management during this visit.  Urinary incontinence New onset urinary incontinence, primarily nocturnal. Managed with absorbent products. No significant daytime incontinence reported.  Ischemic enteritis Ischemic enteritis approximately one year ago, managed conservatively without surgical intervention.  Arachnoid cyst Arachnoid cyst identified on MRI in 2019. No symptoms or interventions required.  Goals of Care She has a DNR and prefers minimal interventions. Wishes to avoid emergency room visits and prefers comfort measures if health significantly declines. Expresses contentment with the inevitability of death and does not want to be a burden to family.    She states if she becomes ill on campus, her preference would be to transition to [facility] for comfort measures rather than hospitalization.    Labs/tests ordered:  * No order type specified * Next appt:  02/01/2024

## 2024-01-06 NOTE — Progress Notes (Unsigned)
 Cardiology Office Note    Date:  01/11/2024   ID:  Shellby Schlink, DOB [birth year], MRN [id number]  PCP:  Valrie Gehrig, MD  Cardiologist:  Belva Boyden, MD  Electrophysiologist:  None   Chief Complaint: Follow up  History of Present Illness:   Jamie Hodges is a 89 y.o. female with history of PAF on apixaban , diastolic dysfunction, valvular heart disease with mild mitral regurgitation and stenosis as well as mild to moderate aortic insufficiency, ischemic enteritis, aortic atherosclerosis, anemia, CKD stage III, and HTN who presents for follow-up of HFpEF and A-fib.   She was admitted to the hospital in 02/2021 with ischemic enteritis and was A-fib with RVR.  She was consulted on by outside cardiology group and managed with IV diltiazem , digoxin , and amiodarone .  Echo during the admission showed an EF of 60 to 65%, no regional wall motion abnormalities, moderate concentric LVH, grade 1 diastolic dysfunction, normal RV systolic function and ventricular cavity size, mild biatrial enlargement, mild to moderate mitral regurgitation, severe mitral annular calcification, mild aortic valve sclerosis without evidence of stenosis, and an estimated right atrial pressure of 3 mmHg.   She established care with our office in 04/2021 for ongoing management of A-fib.  She was in sinus rhythm with recommendation to continue amiodarone , metoprolol  tartrate, diltiazem  and apixaban .  Note indicates that Dr. Jerelene Monday spoke with the patient's son, who is a physician out of state, with preference to avoid invasive/hospital-based procedures.   She was admitted in 07/2021 with ischemic enteritis complicated by SBO versus ileus.  Sinus rhythm on EKG.  Hospital course also notable for downtrending hemoglobin during the admission with an initial hemoglobin of 11.3 trending to 8.4 at time of discharge.  Follow-up hemoglobin in 10/2021 improved at 11.5.   She was admitted to the hospital in 06/2022 with recurrent ischemic  enteritis.  She declined surgical consultation.  She was treated with IV fluids, analgesics, and empiric antibiotics.  Internal medicine discontinued amiodarone  given concerns for prolonged QTc on EKG dated 07/04/2022.  However, upon reviewing that EKG, QTc was unable to be calculated due to significant baseline artifact and wandering.  EKG performed 2 days later showed improved QTc of 475.   She was seen in the office in 11/2022 and was doing well from a cardiac perspective.  She was able to swim laps in the pool at a slow pace without significant dyspnea, though reported a long history of exertional shortness of breath when making the bed or doing household chores.  In the setting of this shortness of breath, she underwent echo in 12/2022 that showed an EF of 55 to 60%, no regional wall motion abnormalities, mild LVH, grade 1 diastolic dysfunction, normal RV systolic function and ventricular cavity size, mildly dilated left atrium, degenerative mitral valve with mild regurgitation and mild stenosis, mild to moderate aortic insufficiency, aortic valve sclerosis without evidence of stenosis, and an estimated right atrial pressure of 3 mmHg.  She was last seen in the office in 09/2023 and was doing well noting a slight progression of shortness of breath.  She continued to swim 20 laps in the pool at a slow pace 3 days/week without symptoms of dyspnea.  She was agreeable to echo, though reported she would not want to pursue further cardiac testing beyond this.  In this setting she underwent echo in 10/2023 that showed an EF of 60 to 65%, no regional wall motion abnormalities, grade 1 diastolic dysfunction normal RV systolic function and ventricular cavity size,  mildly to moderately dilated left atrium, mild mitral regurgitation aortic insufficiency with aortic valve sclerosis without evidence of stenosis, and an estimated right atrial pressure of 3 mmHg.  Losartan  has subsequently been discontinued secondary to issues  with hyperkalemia.  She comes in today and is without symptoms of angina or cardiac decompensation.  She continues to note chronic, stable shortness of breath with activities such as cleaning the house or making the bed.  Dyspnea is as unchanged from prior visits.  She continues to be able to swim 20 laps in the pool at a slow pace 3 days/week without symptoms of dyspnea.  No frank chest pain.  She does occasionally note tachypalpitations that are brief, typically lasting several minutes, and spontaneously resolve.  No significant lower extremity swelling or progressive orthopnea.  No falls, hematochezia, or melena.  Blood pressures at home are largely in the 160s to 180s systolic with an occasional reading in the 140s systolic and rare reading in the 190s systolic.  These readings are taken in the mornings before her diltiazem .  Continues to decline further cardiac testing beyond recently performed echo.  Does not want to pursue noninvasive or invasive ischemic cardiac testing.  She indicates she would have been fine if "I died last night."  She is without SI or HI.   Labs independently reviewed: 09/2023 - potassium 4.4, BUN 37, serum creatinine 1.81, Hgb 12.3, PLT 202 01/2023 - TSH normal, TC 218, TG 70, HDL 63, LDL 140, albumin 4.3, AST/ALT normal   Past Medical History:  Diagnosis Date   Atrial fibrillation (HCC) 2010   One time during hospital while sick with severe diarrhea   C. difficile colitis    3/10  Severe C. dif ---had brief atrial fib then. Cath shows some blockages but no intervention   CAD (coronary artery disease) 2010   minor blockages --no intervention indicated   Hx of colonic polyps    Hyperlipidemia    Hypertension     Past Surgical History:  Procedure Laterality Date   CHOLECYSTECTOMY  3/16   ERCP N/A 01/14/2015   Procedure: ENDOSCOPIC RETROGRADE CHOLANGIOPANCREATOGRAPHY (ERCP);  Surgeon: Marnee Sink, MD;  Location: [facility] ENDOSCOPY;  Service: Endoscopy;  Laterality: N/A;    TONSILLECTOMY AND ADENOIDECTOMY      Current Medications: Current Meds  Medication Sig   acetaminophen  (TYLENOL ) 325 MG tablet Take 650 mg by mouth every 6 (six) hours as needed.   apixaban  (ELIQUIS ) 2.5 MG TABS tablet Take 1 tablet by mouth twice daily   diltiazem  (TIAZAC ) 360 MG 24 hr capsule Take 1 tablet by mouth once daily   meclizine  (ANTIVERT ) 25 MG tablet TAKE 1 TABLET BY MOUTH THREE TIMES DAILY AS NEEDED FOR NAUSEA AND VOMITING   [DISCONTINUED] metoprolol  succinate (TOPROL -XL) 25 MG 24 hr tablet Take 0.5 tablets (12.5 mg total) by mouth daily for 14 days. Take with or immediately following a meal.    Allergies:   Codeine sulfate, Crestor [rosuvastatin calcium], Other, and Penicillins   Social History   Socioeconomic History   Marital status: Widowed    Spouse name: Not on file   Number of children: 3   Years of education: Not on file   Highest education level: Not on file  Occupational History   Occupation: retired 1st grade teacher  Tobacco Use   Smoking status: Former    Current packs/day: 0.00    Types: Cigarettes    Quit date: 08/16/1978    Years since quitting: 45.4    Passive  exposure: Never   Smokeless tobacco: Never  Vaping Use   Vaping status: Never Used  Substance and Sexual Activity   Alcohol use: Never   Drug use: No   Sexual activity: Not on file  Other Topics Concern   Not on file  Social History Narrative   Has living will.    Son Darrow End (MD) to make health care decisions.--then son Twilla Galea   Has DNR order in past and requests again--done   No tube feeds if cognitively unaware         Social Drivers of Health   Financial Resource Strain: Not on file  Food Insecurity: No Food Insecurity (01/04/2024)   Hunger Vital Sign    Worried About Running Out of Food in the Last Year: Never true    Ran Out of Food in the Last Year: Never true  Transportation Needs: No Transportation Needs (01/04/2024)   PRAPARE - Administrator, Civil Service  (Medical): No    Lack of Transportation (Non-Medical): No  Physical Activity: Not on file  Stress: Not on file  Social Connections: Not on file     Family History:  The patient's family history includes COPD in her mother. There is no history of Coronary artery disease, Diabetes, or Cancer.  ROS:   12-point review of systems is negative unless otherwise noted in the HPI.   EKGs/Labs/Other Studies Reviewed:    Studies reviewed were summarized above. The additional studies were reviewed today:  2D echo 10/19/2023: 1. Left ventricular ejection fraction, by estimation, is 60 to 65%. Left  ventricular ejection fraction by 3D volume is 61 %. The left ventricle has  normal function. The left ventricle has no regional wall motion  abnormalities. Left ventricular diastolic   parameters are consistent with Grade I diastolic dysfunction (impaired  relaxation). The average left ventricular global longitudinal strain is  -18.9 %. The global longitudinal strain is normal.   2. Right ventricular systolic function is normal. The right ventricular  size is normal. Tricuspid regurgitation signal is inadequate for assessing  PA pressure.   3. Left atrial size was mild to moderately dilated.   4. The mitral valve is normal in structure. Mild mitral valve  regurgitation. No evidence of mitral stenosis.   5. The aortic valve is normal in structure. Aortic valve regurgitation is  mild to moderate. Aortic valve sclerosis is present, with no evidence of  aortic valve stenosis.   6. The inferior vena cava is normal in size with greater than 50%  respiratory variability, suggesting right atrial pressure of 3 mmHg.  __________  2D echo 12/29/2022: 1. Left ventricular ejection fraction, by estimation, is 55 to 60%. Left  ventricular ejection fraction by PLAX is 58 %. The left ventricle has  normal function. The left ventricle has no regional wall motion  abnormalities. There is mild left ventricular   hypertrophy. Left ventricular diastolic parameters are consistent with  Grade I diastolic dysfunction (impaired relaxation).   2. Right ventricular systolic function is normal. The right ventricular  size is normal.   3. Left atrial size was mildly dilated.   4. Mean mitral valve gradient .. The mitral valve is degenerative.  Mild mitral valve regurgitation. Mild mitral stenosis.   5. The aortic valve is tricuspid. Aortic valve regurgitation is mild to  moderate. Aortic valve sclerosis/calcification is present, without any  evidence of aortic stenosis.   6. The inferior vena cava is normal in size with greater than 50%  respiratory variability, suggesting right atrial pressure of 3 mmHg.   Comparison(s): 02/27/21: 60-65% mild LVH/MR, [facility]  __________   2D echo 02/27/2021: 1. Left ventricular ejection fraction, by estimation, is 60 to 65%. The  left ventricle has normal function. The left ventricle has no regional  wall motion abnormalities. There is moderate concentric left ventricular  hypertrophy. Left ventricular  diastolic parameters are consistent with Grade I diastolic dysfunction  (impaired relaxation).   2. Right ventricular systolic function is normal. The right ventricular  size is normal.   3. Left atrial size was mildly dilated.   4. Right atrial size was mildly dilated.   5. The mitral valve is normal in structure. Mild to moderate mitral valve  regurgitation. No evidence of mitral stenosis. Severe mitral annular  calcification.   6. The aortic valve is normal in structure. Aortic valve regurgitation is  not visualized. Mild aortic valve sclerosis is present, with no evidence  of aortic valve stenosis.   7. The inferior vena cava is normal in size with greater than 50%  respiratory variability, suggesting right atrial pressure of 3 mmHg.   EKG:  EKG is not ordered today.   Recent Labs: 02/02/2023: ALT 10; TSH 2.24 09/28/2023: BUN 37; Creatinine, Ser 1.81;  Hemoglobin 12.3; Platelets 202; Sodium 142 10/03/2023: Potassium 4.4  Recent Lipid Panel    Component Value Date/Time   CHOL 218 (H) 02/02/2023 1128   TRIG 70.0 02/02/2023 1128   HDL 63.70 02/02/2023 1128   CHOLHDL 3 02/02/2023 1128   VLDL 14.0 02/02/2023 1128   LDLCALC 140 (H) 02/02/2023 1128   LDLDIRECT 161.9 01/13/2011 1220    PHYSICAL EXAM:    VS:  BP (!) 168/66   Pulse 88   Ht 5\' 7"  (1.702 m)   Wt 151 lb 3.2 oz (68.6 kg)   SpO2 91%   BMI 23.68 kg/m   BMI: Body mass index is 23.68 kg/m.  Physical Exam Vitals reviewed.  Constitutional:      Appearance: She is well-developed.  HENT:     Head: Normocephalic and atraumatic.  Eyes:     General:        Right eye: No discharge.        Left eye: No discharge.  Cardiovascular:     Rate and Rhythm: Normal rate and regular rhythm.     Heart sounds: S1 normal and S2 normal. Heart sounds not distant. No midsystolic click and no opening snap. Murmur heard.     Systolic murmur is present with a grade of 2/6 at the apex.     No friction rub.  Pulmonary:     Effort: Pulmonary effort is normal. No respiratory distress.     Breath sounds: Normal breath sounds. No decreased breath sounds, wheezing, rhonchi or rales.  Chest:     Chest wall: No tenderness.  Musculoskeletal:     Cervical back: Normal range of motion.     Right lower leg: No edema.     Left lower leg: No edema.  Skin:    General: Skin is warm and dry.     Nails: There is no clubbing.  Neurological:     Mental Status: She is alert and oriented to person, place, and time.  Psychiatric:        Speech: Speech normal.        Behavior: Behavior normal.        Thought Content: Thought content normal.        Judgment: Judgment normal.  Wt Readings from Last 3 Encounters:  01/11/24 151 lb 3.2 oz (68.6 kg)  01/04/24 151 lb 3.2 oz (68.6 kg)  09/28/23 150 lb 6.4 oz (68.2 kg)     ASSESSMENT & PLAN:   PAF: Maintaining sinus rhythm by physical exam but with  brief, self-limited paroxysms of palpitations.  Backup Toprol -XL at 25 mg nightly given palpitations and elevated BP readings, particularly in the morning hours.  She will otherwise continue long-acting diltiazem  360 mg daily.  CHA2DS2-VASc at least 5 (HTN, age x 2, vascular disease, sex category).  She remains on renally dosed apixaban  given age greater than 80 and serum creatinine greater than 1.5.  No falls or symptoms concerning for bleeding.  Diastolic dysfunction with history of exertional dyspnea: Euvolemic and well compensated.  Optimize BP regimen as outlined below.  Not requiring a standing loop diuretic.  She reports a longstanding history of dyspnea, though continues to be able to swim multiple days per week symptoms.  Recent echo showed preserved LV systolic function with grade 1 diastolic dysfunction and mild mitral regurgitation with mild to moderate aortic insufficiency without evidence of aortic stenosis.  Laboratory evaluation has been unrevealing.  She continues to indicate she would not want to proceed with further cardiac testing beyond the previously performed echo, including declination of noninvasive and invasive ischemic cardiac testing.  Cannot exclude some degree of hypertension contributing to dyspnea with recommendation to escalate antihypertensive therapy as outlined below.  Valvular heart disease: Echo in 10/2023 showed mild mitral regurgitation with mild to moderate aortic insufficiency with aortic valve sclerosis without evidence of stenosis.  Medical management as outlined below.  HTN: Blood pressure has been trending on the higher side at home and in the office today.  Losartan  was previously discontinued secondary to issues with hyperkalemia.  Add back Toprol -XL at 25 mg nightly with continuation of long-acting diltiazem  360 mg daily.  Aortic atherosclerosis: Noted on CT.  Not currently on statin, given advanced age this seems reasonable.     Disposition: F/u with Dr.  Gollan or an APP in 6 months.   Medication Adjustments/Labs and Tests Ordered: Current medicines are reviewed at length with the patient today.  Concerns regarding medicines are outlined above. Medication changes, Labs and Tests ordered today are summarized above and listed in the Patient Instructions accessible in Encounters.   Signed, Varney Gentleman, PA-C 01/11/2024 12:52 PM     [facility] [place] [address] [place], [place] [address] [phone]

## 2024-01-11 ENCOUNTER — Ambulatory Visit: Attending: Physician Assistant | Admitting: Physician Assistant

## 2024-01-11 ENCOUNTER — Encounter: Payer: Self-pay | Admitting: Physician Assistant

## 2024-01-11 VITALS — BP 168/66 | HR 88 | Ht 67.0 in | Wt 151.2 lb

## 2024-01-11 DIAGNOSIS — I340 Nonrheumatic mitral (valve) insufficiency: Secondary | ICD-10-CM | POA: Insufficient documentation

## 2024-01-11 DIAGNOSIS — I480 Paroxysmal atrial fibrillation: Secondary | ICD-10-CM | POA: Diagnosis not present

## 2024-01-11 DIAGNOSIS — I10 Essential (primary) hypertension: Secondary | ICD-10-CM | POA: Insufficient documentation

## 2024-01-11 DIAGNOSIS — I700 Atherosclerosis of aorta: Secondary | ICD-10-CM | POA: Diagnosis not present

## 2024-01-11 DIAGNOSIS — I351 Nonrheumatic aortic (valve) insufficiency: Secondary | ICD-10-CM | POA: Insufficient documentation

## 2024-01-11 DIAGNOSIS — I5189 Other ill-defined heart diseases: Secondary | ICD-10-CM | POA: Insufficient documentation

## 2024-01-11 MED ORDER — METOPROLOL SUCCINATE ER 25 MG PO TB24: 25.0000 mg | ORAL_TABLET | Freq: Every day | ORAL | 3 refills | Status: DC

## 2024-01-11 NOTE — Patient Instructions (Signed)
 Medication Instructions:  Your physician recommends the following medication changes.  Take Diltiazem  360mg  by mouth daily in the morning.  INCREASE: Metoprolol  25mg  by mouth at bedtime   *If you need a refill on your cardiac medications before your next appointment, please call your pharmacy*  Lab Work: No labs ordered today  If you have labs (blood work) drawn today and your tests are completely normal, you will receive your results only by: MyChart Message (if you have MyChart) OR A paper copy in the mail If you have any lab test that is abnormal or we need to change your treatment, we will call you to review the results.  Testing/Procedures: No test ordered today   Follow-Up: At [facility], you and your health needs are our priority.  As part of our continuing mission to provide you with exceptional heart care, our providers are all part of one team.  This team includes your primary Cardiologist (physician) and Advanced Practice Providers or APPs (Physician Assistants and Nurse Practitioners) who all work together to provide you with the care you need, when you need it.  Your next appointment:   6 month(s)  Provider:    Timothy Gollan, MD or Varney Gentleman, PA-C

## 2024-01-12 ENCOUNTER — Other Ambulatory Visit: Payer: Self-pay | Admitting: Internal Medicine

## 2024-01-16 ENCOUNTER — Other Ambulatory Visit: Payer: Self-pay | Admitting: Internal Medicine

## 2024-01-18 ENCOUNTER — Other Ambulatory Visit: Payer: Self-pay | Admitting: Internal Medicine

## 2024-01-23 ENCOUNTER — Telehealth: Payer: Self-pay | Admitting: Student

## 2024-01-23 ENCOUNTER — Other Ambulatory Visit: Payer: Self-pay | Admitting: Internal Medicine

## 2024-01-23 NOTE — Telephone Encounter (Signed)
 Copied from CRM [id number]. Topic: Clinical - Medication Refill >> Jan 23, 2024  4:00 PM Adrianna P wrote: Medication: diltiazem  (TIAZAC ) 360 MG 24 hr capsule  Has the patient contacted their pharmacy? Yes (Agent: If no, request that the patient contact the pharmacy for the refill. If patient does not wish to contact the pharmacy document the reason why and proceed with request.) (Agent: If yes, when and what did the pharmacy advise?)  This is the patient's preferred pharmacy:  [facility] [address] [place] - [address] [place] [address] Phone: [phone] Fax: [phone]  Is this the correct pharmacy for this prescription? Yes If no, delete pharmacy and type the correct one.   Has the prescription been filled recently? No  Is the patient out of the medication? No  Has the patient been seen for an appointment in the last year OR does the patient have an upcoming appointment? Yes  Can we respond through MyChart? No  Agent: Please be advised that Rx refills may take up to 3 business days. We ask that you follow-up with your pharmacy.

## 2024-01-23 NOTE — Telephone Encounter (Signed)
 duplicate

## 2024-01-24 MED ORDER — DILTIAZEM HCL ER BEADS 360 MG PO CP24: 360.0000 mg | ORAL_CAPSULE | Freq: Every day | ORAL | 1 refills | Status: DC

## 2024-01-24 NOTE — Addendum Note (Signed)
 Addended by: Fredric Jean on: 01/24/2024 10:06 AM   Modules accepted: Orders

## 2024-01-28 ENCOUNTER — Other Ambulatory Visit: Payer: Self-pay | Admitting: Internal Medicine

## 2024-01-30 ENCOUNTER — Other Ambulatory Visit: Payer: Self-pay | Admitting: Student

## 2024-01-30 DIAGNOSIS — I480 Paroxysmal atrial fibrillation: Secondary | ICD-10-CM

## 2024-01-30 MED ORDER — APIXABAN 2.5 MG PO TABS
2.5000 mg | ORAL_TABLET | Freq: Two times a day (BID) | ORAL | 1 refills | Status: DC
Start: 2024-01-30 — End: 2024-02-01

## 2024-01-30 NOTE — Progress Notes (Signed)
 Received notification that patient is due for anticoagulation

## 2024-02-01 ENCOUNTER — Ambulatory Visit: Admitting: Student

## 2024-02-01 ENCOUNTER — Encounter: Payer: Self-pay | Admitting: Student

## 2024-02-01 ENCOUNTER — Other Ambulatory Visit: Payer: Self-pay | Admitting: Cardiovascular Disease

## 2024-02-01 VITALS — BP 138/64 | HR 83 | Temp 97.6°F | Ht 67.0 in | Wt 148.2 lb

## 2024-02-01 DIAGNOSIS — I5032 Chronic diastolic (congestive) heart failure: Secondary | ICD-10-CM

## 2024-02-01 DIAGNOSIS — L304 Erythema intertrigo: Secondary | ICD-10-CM | POA: Diagnosis not present

## 2024-02-01 DIAGNOSIS — I10 Essential (primary) hypertension: Secondary | ICD-10-CM | POA: Diagnosis not present

## 2024-02-01 DIAGNOSIS — K559 Vascular disorder of intestine, unspecified: Secondary | ICD-10-CM | POA: Diagnosis not present

## 2024-02-01 DIAGNOSIS — I480 Paroxysmal atrial fibrillation: Secondary | ICD-10-CM | POA: Diagnosis not present

## 2024-02-01 DIAGNOSIS — N184 Chronic kidney disease, stage 4 (severe): Secondary | ICD-10-CM

## 2024-02-01 DIAGNOSIS — Z515 Encounter for palliative care: Secondary | ICD-10-CM | POA: Insufficient documentation

## 2024-02-01 MED ORDER — LOSARTAN POTASSIUM 25 MG PO TABS: 25.0000 mg | ORAL_TABLET | Freq: Every day | ORAL | 3 refills | Status: DC

## 2024-02-01 MED ORDER — FLUCONAZOLE 150 MG PO TABS: 150.0000 mg | ORAL_TABLET | ORAL | 0 refills | Status: AC

## 2024-02-01 MED ORDER — APIXABAN 2.5 MG PO TABS: 2.5000 mg | ORAL_TABLET | Freq: Two times a day (BID) | ORAL | 1 refills | Status: AC

## 2024-02-01 MED ORDER — HYDROCODONE-ACETAMINOPHEN 5-325 MG PO TABS: 1.0000 | ORAL_TABLET | Freq: Four times a day (QID) | ORAL | 0 refills | Status: AC | PRN

## 2024-02-01 NOTE — Patient Instructions (Signed)
 VISIT SUMMARY:  During today's visit, we discussed your concerns about high blood pressure readings at home, shortness of breath, ischemic bowel attacks, and redness on your thighs. We reviewed your current medications and made some adjustments to help manage your symptoms more effectively.  YOUR PLAN:  -ATRIAL FIBRILLATION: Atrial fibrillation is an irregular and often rapid heart rate that can increase your risk of strokes and other heart-related complications. We will continue your current medications, Eliquis  and diltiazem , but discontinue metoprolol  to see if it helps with your shortness of breath.  -SHORTNESS OF BREATH: Shortness of breath with minimal exertion can be due to various causes, including heart conditions like atrial fibrillation. We will stop metoprolol  to see if it improves your symptoms. Please follow up in two weeks to reassess your condition.  -ISCHEMIC BOWEL DISEASE: Ischemic bowel disease occurs when blood flow to the intestines is reduced, causing pain. You will continue to use hydrocodone  as needed for pain management. A new prescription for hydrocodone  5/325 mg will be sent to [facility].  -HYPERTENSION: Hypertension, or high blood pressure, can lead to serious health issues if not managed properly. Your home blood pressure readings have been high, so we will restart losartan . Please bring your home blood pressure monitor to the clinic to compare it with our office monitor.  -YEAST INFECTION ON THIGHS: A yeast infection is a fungal infection that can cause redness and irritation. We will prescribe an oral antifungal medication to be taken once a week for three weeks to help clear the infection.  INSTRUCTIONS:  Please follow up in two weeks JULY 2 at 10:30AM to reassess your shortness of breath. Additionally, bring your home blood pressure monitor to your next visit so we can compare it with the office monitor.

## 2024-02-02 NOTE — Progress Notes (Signed)
 [facility] Provider: Jann Melody  Code Status: DNR Goals of Care:     01/04/2024    2:15 PM  Advanced Directives  Does Patient Have a Medical Advance Directive? Yes  Type of Estate agent of [place];Living will;Out of facility DNR (pink MOST or yellow form)  Does patient want to make changes to medical advance directive? No - Patient declined  Copy of Healthcare Power of Attorney in Chart? No - copy requested     Chief Complaint  Patient presents with   Medical Management of Chronic Issues    Medical Management of Chronic Issues. 1 Month follow up. Patient restarted the Losartan . Patient requesting Rx for Hydrocodone .    HPI: Patient is a 89 y.o. female seen today for medical management of chronic diseases.   Discussed the use of AI scribe software for clinical note transcription with the patient, who gave verbal consent to proceed.  History of Present Illness   Jamie Hodges is a 89 year old female with hypertension who presents with concerns about high blood pressure readings at home.  She is concerned about elevated blood pressure readings taken at home and has resumed taking losartan  on her own. She is considering bringing her home blood pressure machine to the clinic to compare readings.  She experiences ischemic bowel attacks and takes hydrocodone  only when the attacks occur. She had an attack earlier this week, which was not severe, and she took one hydrocodone  tablet for relief. She has been using the same prescription for two years, indicating infrequent use. No specific triggers for these attacks.  She experiences shortness of breath with minimal exertion, which has been ongoing for some time. She swims about three times a week for exercise. She is on medications including Eliquis , metoprolol , and diltiazem . She has a history of atrial fibrillation.  She mentions having redness on the top of her thighs, which she attributes  to a possible yeast infection. She has been using a fungus powder for about two weeks without noticing improvement. She recalls having a yeast infection in the past and using a similar powder. She is open to trying a different treatment if necessary.         Past Medical History:  Diagnosis Date   Atrial fibrillation (HCC) 2010   One time during hospital while sick with severe diarrhea   C. difficile colitis    3/10  Severe C. dif ---had brief atrial fib then. Cath shows some blockages but no intervention   CAD (coronary artery disease) 2010   minor blockages --no intervention indicated   Hx of colonic polyps    Hyperlipidemia    Hypertension     Past Surgical History:  Procedure Laterality Date   CHOLECYSTECTOMY  3/16   ERCP N/A 01/14/2015   Procedure: ENDOSCOPIC RETROGRADE CHOLANGIOPANCREATOGRAPHY (ERCP);  Surgeon: Marnee Sink, MD;  Location: [facility] ENDOSCOPY;  Service: Endoscopy;  Laterality: N/A;   TONSILLECTOMY AND ADENOIDECTOMY      Allergies  Allergen Reactions   Codeine Sulfate     REACTION: rash/ welps   Crestor [Rosuvastatin Calcium ]     myalgia   Other     Wild rice   Penicillins Hives and Rash    Has patient had a PCN reaction causing immediate rash, facial/tongue/throat swelling, SOB or lightheadedness with hypotension: Yes Has patient had a PCN reaction causing severe rash involving mucus membranes or skin necrosis: No Has patient had a PCN reaction that required  hospitalization: No Has patient had a PCN reaction occurring within the last 10 years: Yes If all of the above answers are NO, then may proceed with Cephalosporin use.    Outpatient Encounter Medications as of 02/01/2024  Medication Sig   fluconazole (DIFLUCAN) 150 MG tablet Take 1 tablet (150 mg total) by mouth once a week for 3 doses.   HYDROcodone -acetaminophen  (NORCO/VICODIN) 5-325 MG tablet Take 1 tablet by mouth every 6 (six) hours as needed for moderate pain (pain score 4-6).   [DISCONTINUED]  losartan  (COZAAR ) 25 MG tablet Take 25 mg by mouth daily.   acetaminophen  (TYLENOL ) 325 MG tablet Take 650 mg by mouth every 6 (six) hours as needed.   apixaban  (ELIQUIS ) 2.5 MG TABS tablet Take 1 tablet (2.5 mg total) by mouth 2 (two) times daily.   diltiazem  (TIAZAC ) 360 MG 24 hr capsule Take 1 capsule (360 mg total) by mouth daily.   losartan  (COZAAR ) 25 MG tablet Take 1 tablet (25 mg total) by mouth daily.   meclizine  (ANTIVERT ) 25 MG tablet TAKE 1 TABLET BY MOUTH THREE TIMES DAILY AS NEEDED FOR NAUSEA AND VOMITING   [DISCONTINUED] apixaban  (ELIQUIS ) 2.5 MG TABS tablet Take 1 tablet (2.5 mg total) by mouth 2 (two) times daily.   [DISCONTINUED] metoprolol  succinate (TOPROL -XL) 25 MG 24 hr tablet Take 1 tablet (25 mg total) by mouth at bedtime.   No facility-administered encounter medications on file as of 02/01/2024.    Review of Systems:  Review of Systems  Health Maintenance  Topic Date Due   DEXA SCAN  Never done   COVID-19 Vaccine (9 - 2024-25 season) 01/20/2024   INFLUENZA VACCINE  03/16/2024   DTaP/Tdap/Td (4 - Td or Tdap) 05/17/2031   Pneumococcal Vaccine: 50+ Years  Completed   Zoster Vaccines- Shingrix  Completed   HPV VACCINES  Aged Out   Meningococcal B Vaccine  Aged Out    Physical Exam: Vitals:   02/01/24 1412  BP: 138/64  Pulse: 83  Temp: 97.6 F (36.4 C)  SpO2: 97%  Weight: 148 lb 3.2 oz (67.2 kg)  Height: 5' 7 (1.702 m)   Body mass index is 23.21 kg/m. Physical Exam Physical Exam   VITALS: BP- 138/64 SKIN: Redness on the top of thighs, likely due to moisture and possible yeast infection.      Labs reviewed: Basic Metabolic Panel: Recent Labs    09/28/23 1403 09/29/23 1550 10/03/23 1059  NA 142  --   --   K 5.6* 5.4* 4.4  CL 111*  --   --   CO2 17*  --   --   GLUCOSE 88  --   --   BUN 37*  --   --   CREATININE 1.81*  --   --   CALCIUM  9.1  --   --    Liver Function Tests: No results for input(s): AST, ALT, ALKPHOS, BILITOT,  PROT, ALBUMIN in the last 8760 hours. No results for input(s): LIPASE, AMYLASE in the last 8760 hours. No results for input(s): AMMONIA in the last 8760 hours. CBC: Recent Labs    09/28/23 1403  WBC 5.3  HGB 12.3  HCT 36.0  MCV 97  PLT 202   Lipid Panel: No results for input(s): CHOL, HDL, LDLCALC, TRIG, CHOLHDL, LDLDIRECT in the last 8760 hours. Lab Results  Component Value Date   HGBA1C 4.9 06/23/2018    Procedures since last visit: No results found. Results   DIAGNOSTIC EKG: Atrial fibrillation  Assessment/Plan    HFpEFAtrial fibrillation Atrial fibrillation managed with Eliquis  and diltiazem . Metoprolol  may contribute to shortness of breath. Research shows patient's with HFpEF can have improved dyspnea symptoms with discontinuation of metoprolol .  - Discontinue metoprolol  to assess impact on shortness of breath. - Continue Eliquis  and diltiazem .  Shortness of breath Chronic exertional dyspnea with differential including cardiac causes such as heart failure or atrial fibrillation. Lungs clear on examination. Metoprolol  may contribute to symptoms. She prefers to avoid extensive testing. - Discontinue metoprolol  to assess impact on shortness of breath. - Follow up in two weeks to reassess symptoms.  Ischemic bowel disease Intermittent ischemic attacks with associated pain, managed with hydrocodone  as needed. Last episode occurred this week, not severe. No specific triggers identified. - Prescribe hydrocodone  5/325 mg for pain management as needed. - Send prescription to [facility].  Hypertension Home blood pressure reported as elevated, but in-office reading was 138/64 mmHg. Possible discrepancy due to home blood pressure monitor. - Restart losartan . - Bring home blood pressure monitor to the clinic for comparison with office monitor.  Yeast infection on thighs Redness on thighs likely due to yeast infection, exacerbated by moisture. She  prefers oral medication. - Prescribe oral antifungal medication to be taken once a week for three weeks.   Hypertension BP >150/90 with current medications. Previously discontinued due to hyperkalemia. Discussed importance of BP check in 2 weeks. RTC for BP check if >140/90, will start hydrochlorothiazide  and repeat labs.       Labs/tests ordered:  ]- losartan  (COZAAR ) 25 MG tablet; Take 1 tablet (25 mg total) by mouth daily.  Dispense: 90 tablet; Refill: 3 - CBC with Differential/Platelet - Basic Metabolic Panel Without GFR - Lipid panel - VITAMIN D  25 Hydroxy (Vit-D Deficiency, Fractures) - PTH, Intact and Calcium  - TSH Next appt:  05/02/2024

## 2024-02-09 ENCOUNTER — Ambulatory Visit

## 2024-02-15 ENCOUNTER — Telehealth: Payer: Self-pay | Admitting: Student

## 2024-02-15 NOTE — Telephone Encounter (Unsigned)
 Copied from CRM [id number]. Topic: Clinical - Medication Question >> Feb 15, 2024 10:41 AM Carmell SAUNDERS wrote: Reason for CRM: Pt asking what medications she should and should not be taking in regards to her blood pressure meds. Please contact pt on her mobile or home number.

## 2024-02-15 NOTE — Telephone Encounter (Signed)
 Patient is returning call. Call dropped before letting her know the office is closed since it's after 5 PM. Attempted to callback twice with no answer. Please contact patient.

## 2024-02-15 NOTE — Telephone Encounter (Signed)
 During our last visit she was on losartan . Did she bring by her BP checks? If not, then she should still be on this medication and bring her log at her soonest convenience. If her blood pressure is consistently >150/90, then I will recommend a second medication to balance out per electrolytes.   Best,  VB

## 2024-02-15 NOTE — Telephone Encounter (Signed)
Tried calling patient, LMOM to return call.

## 2024-02-16 NOTE — Telephone Encounter (Signed)
Tried calling patient, LMOM to return call.

## 2024-02-21 NOTE — Telephone Encounter (Signed)
Tried calling patient, LMOM to return call.

## 2024-02-29 ENCOUNTER — Encounter: Payer: Self-pay | Admitting: Student

## 2024-02-29 ENCOUNTER — Ambulatory Visit: Admitting: Student

## 2024-02-29 VITALS — BP 168/80 | HR 77 | Temp 97.6°F | Ht 67.0 in | Wt 153.0 lb

## 2024-02-29 DIAGNOSIS — I5032 Chronic diastolic (congestive) heart failure: Secondary | ICD-10-CM

## 2024-02-29 DIAGNOSIS — N184 Chronic kidney disease, stage 4 (severe): Secondary | ICD-10-CM | POA: Diagnosis not present

## 2024-02-29 DIAGNOSIS — I10 Essential (primary) hypertension: Secondary | ICD-10-CM | POA: Diagnosis not present

## 2024-02-29 MED ORDER — CHLORTHALIDONE 25 MG PO TABS: 25.0000 mg | ORAL_TABLET | Freq: Every day | ORAL | 1 refills | Status: DC

## 2024-02-29 MED ORDER — METOPROLOL SUCCINATE ER 50 MG PO TB24: 50.0000 mg | ORAL_TABLET | Freq: Every day | ORAL | 3 refills | Status: AC

## 2024-02-29 NOTE — Progress Notes (Signed)
 [facility] Provider: ABDUL  Code Status: DNR Goals of Care:     01/04/2024    2:15 PM  Advanced Directives  Does Patient Have a Medical Advance Directive? Yes  Type of Estate agent of [place];Living will;Out of facility DNR (pink MOST or yellow form)  Does patient want to make changes to medical advance directive? No - Patient declined  Copy of Healthcare Power of Attorney in Chart? No - copy requested     Chief Complaint  Patient presents with   Blood Pressure Concerns    Blood Pressure Concerns with readings running High. Wants to discuss Medications.  Patient taking Metoprolol  but unsure of mg.  Blood Pressure taken with patient's machine and it was 168 80 with pulse 80    HPI: Patient is a 89 y.o. female seen today for medical management of chronic diseases.   Discussed the use of AI scribe software for clinical note transcription with the patient, who gave verbal consent to proceed.  History of Present Illness   Anglea Hodges is a 89 year old female with hypertension who presents with elevated blood pressure readings.  She has been experiencing elevated blood pressure readings at home, with values such as 184/78, 183/86, 179/87, and a recent reading of 196/106. A clinic reading was 168/80. She discontinued metoprolol  to observe any effects but noticed no difference and has since resumed it. Her current medications include diltiazem , losartan , Eliquis , and metoprolol  25 mg.  No recent changes in shortness of breath, headaches, or chest pain. She reports being incontinent but states she can manage it. A previous rash has faded.         Past Medical History:  Diagnosis Date   Atrial fibrillation (HCC) 2010   One time during hospital while sick with severe diarrhea   C. difficile colitis    3/10  Severe C. dif ---had brief atrial fib then. Cath shows some blockages but no intervention   CAD (coronary artery disease)  2010   minor blockages --no intervention indicated   Hx of colonic polyps    Hyperlipidemia    Hypertension     Past Surgical History:  Procedure Laterality Date   CHOLECYSTECTOMY  3/16   ERCP N/A 01/14/2015   Procedure: ENDOSCOPIC RETROGRADE CHOLANGIOPANCREATOGRAPHY (ERCP);  Surgeon: Rogelia Copping, MD;  Location: [facility] ENDOSCOPY;  Service: Endoscopy;  Laterality: N/A;   TONSILLECTOMY AND ADENOIDECTOMY      Allergies  Allergen Reactions   Codeine Sulfate     REACTION: rash/ welps   Crestor [Rosuvastatin Calcium ]     myalgia   Other     Wild rice   Penicillins Hives and Rash    Has patient had a PCN reaction causing immediate rash, facial/tongue/throat swelling, SOB or lightheadedness with hypotension: Yes Has patient had a PCN reaction causing severe rash involving mucus membranes or skin necrosis: No Has patient had a PCN reaction that required hospitalization: No Has patient had a PCN reaction occurring within the last 10 years: Yes If all of the above answers are NO, then may proceed with Cephalosporin use.    Outpatient Encounter Medications as of 02/29/2024  Medication Sig   acetaminophen  (TYLENOL ) 325 MG tablet Take 650 mg by mouth every 6 (six) hours as needed.   apixaban  (ELIQUIS ) 2.5 MG TABS tablet Take 1 tablet (2.5 mg total) by mouth 2 (two) times daily.   chlorthalidone  (HYGROTON ) 25 MG tablet Take 1 tablet (25 mg total)  by mouth daily.   diltiazem  (TIAZAC ) 360 MG 24 hr capsule Take 1 capsule (360 mg total) by mouth daily.   HYDROcodone -acetaminophen  (NORCO/VICODIN) 5-325 MG tablet Take 1 tablet by mouth every 6 (six) hours as needed for moderate pain (pain score 4-6).   losartan  (COZAAR ) 25 MG tablet Take 1 tablet (25 mg total) by mouth daily.   meclizine  (ANTIVERT ) 25 MG tablet TAKE 1 TABLET BY MOUTH THREE TIMES DAILY AS NEEDED FOR NAUSEA AND VOMITING   metoprolol  succinate (TOPROL -XL) 50 MG 24 hr tablet Take 1 tablet (50 mg total) by mouth daily. Take with or  immediately following a meal.   No facility-administered encounter medications on file as of 02/29/2024.    Review of Systems:  Review of Systems  Health Maintenance  Topic Date Due   DEXA SCAN  Never done   COVID-19 Vaccine (9 - 2024-25 season) 01/20/2024   INFLUENZA VACCINE  03/16/2024   DTaP/Tdap/Td (4 - Td or Tdap) 05/17/2031   Pneumococcal Vaccine: 50+ Years  Completed   Zoster Vaccines- Shingrix  Completed   Hepatitis B Vaccines  Aged Out   HPV VACCINES  Aged Out   Meningococcal B Vaccine  Aged Out    Physical Exam: Vitals:   02/29/24 1457 02/29/24 1501  BP: (!) 152/74 (!) 168/80  Pulse: 77   Temp: 97.6 F (36.4 C)   SpO2: 97%   Weight: 153 lb (69.4 kg)   Height: 5' 7 (1.702 m)    Body mass index is 23.96 kg/m. Physical Exam Constitutional:      Appearance: Normal appearance.  Cardiovascular:     Rate and Rhythm: Normal rate and regular rhythm.     Pulses: Normal pulses.     Heart sounds: Normal heart sounds.  Pulmonary:     Effort: Pulmonary effort is normal.  Abdominal:     General: Abdomen is flat. Bowel sounds are normal.     Palpations: Abdomen is soft.  Musculoskeletal:        General: No swelling or tenderness.  Skin:    General: Skin is warm and dry.  Neurological:     Mental Status: She is alert and oriented to person, place, and time.     Gait: Gait normal.  Psychiatric:        Mood and Affect: Mood normal.    Physical Exam   VITALS: BP- 168/80 CHEST: Lungs clear to auscultation bilaterally      Labs reviewed: Basic Metabolic Panel: Recent Labs    09/28/23 1403 09/29/23 1550 10/03/23 1059  NA 142  --   --   K 5.6* 5.4* 4.4  CL 111*  --   --   CO2 17*  --   --   GLUCOSE 88  --   --   BUN 37*  --   --   CREATININE 1.81*  --   --   CALCIUM  9.1  --   --    Liver Function Tests: No results for input(s): AST, ALT, ALKPHOS, BILITOT, PROT, ALBUMIN in the last 8760 hours. No results for input(s): LIPASE, AMYLASE in  the last 8760 hours. No results for input(s): AMMONIA in the last 8760 hours. CBC: Recent Labs    09/28/23 1403  WBC 5.3  HGB 12.3  HCT 36.0  MCV 97  PLT 202   Lipid Panel: No results for input(s): CHOL, HDL, LDLCALC, TRIG, CHOLHDL, LDLDIRECT in the last 8760 hours. Lab Results  Component Value Date   HGBA1C 4.9 06/23/2018  Procedures since last visit: No results found. Results   LABS Creatinine: 1.81 mg/dL (97/87/7974) GFR: 26 fO/fpw/8.26 m (09/28/2023)      Assessment/Plan    Hypertension Blood pressure remains uncontrolled with current regimen of diltiazem , losartan , and metoprolol . Home readings are consistently elevated, including 196/106 mmHg this morning and 168/80 mmHg in the clinic. Previous slightly elevated potassium levels necessitate a new medication to manage hypertension and balance potassium. - Initiate a new antihypertensive medication to manage blood pressure and balance potassium levels. - Order blood tests in two weeks to monitor kidney function and electrolytes. - Send prescription to [facility] pharmacy.  Chronic Kidney Disease Progressive decline in kidney function with creatinine at 1.81 mg/dL and GFR at 26 fO/fpw/8.26f as of February 12. Close monitoring is essential, especially with medication adjustments, to ensure safe dosing. - Monitor kidney function with blood tests in two weeks. - Adjust medication dosing based on kidney function.  Incontinence Reports incontinence but manages it independently.  Rash Rash has resolved with no further treatment necessary.  Follow-up Blood collection needed to monitor kidney function and electrolytes. Options include [facility] or [facility] for convenience. - Schedule blood collection at [facility] or [facility] at her convenience. - Confirm blood collection date with [facility], targeting the week of July 28.       Labs/tests ordered:  * No order type specified * Next appt:  05/02/2024

## 2024-02-29 NOTE — Patient Instructions (Addendum)
 Please have your labs collected at the following location between July 28 and August 2. You do not need an appointment, but please avoid going between 12 and 1 PM. Take your lab orders with you.  [facility] [address] [place],  [place]  [address]  [place]  PHONE: [phone]  VISIT SUMMARY:  Today, we discussed your elevated blood pressure readings and made adjustments to your medication to better manage your hypertension. We also reviewed your chronic kidney disease and planned for follow-up blood tests to monitor your kidney function and electrolytes. Additionally, we noted that your rash has resolved and that you are managing your incontinence independently.  YOUR PLAN:  -HYPERTENSION: Hypertension means high blood pressure. Your blood pressure readings at home and in the clinic have been high, so we are starting you on a new medication to help control it and balance your potassium levels. We will also check your kidney function and electrolytes with a blood test in two weeks.  -CHRONIC KIDNEY DISEASE: Chronic kidney disease means your kidneys are not working as well as they should. We need to monitor your kidney function closely, especially with the new medication. We will do a blood test in two weeks to check your kidney function and adjust your medication if needed.  -INCONTINENCE: Incontinence means you have difficulty controlling your bladder. You mentioned that you are managing it on your own, so no additional treatment is needed at this time.  -RASH: Your rash has resolved, so no further treatment is necessary.  INSTRUCTIONS:  Please schedule a blood collection at [facility] or [facility] at your convenience to monitor your kidney function and electrolytes. Confirm the blood collection date with [facility], targeting the week of July 28.

## 2024-03-02 ENCOUNTER — Ambulatory Visit: Payer: Self-pay

## 2024-03-02 NOTE — Telephone Encounter (Signed)
 FYI Only or Action Required?: FYI only for provider.  Patient was last seen in primary care on 02/29/2024 by Abdul Fine, MD.  Called Nurse Triage reporting Lab result question.  Symptoms began NA.  Interventions attempted: Other: NA.  Symptoms are: NA.  Triage Disposition: Call PCP When Office is Open  Patient/caregiver understands and will follow disposition?: Yes  Copied from CRM [id number]. Topic: Clinical - Medical Advice >> Mar 02, 2024 11:04 AM Alfonso ORN wrote: Reason for CRM: patient has 2 question  when was the last blood test for anemia ? and what was hemoglobin level?  Patient call back # [phone] Reason for Disposition  Caller requesting routine or non-urgent lab result  Answer Assessment - Initial Assessment Questions 1. REASON FOR CALL or QUESTION: What is your reason for calling today? or How can I best     When was my last anemia test and what was my hemoglobin and anemia level.  2. CALLER: Document the source of call. (e.g., laboratory staff, caregiver or patient).     Patient  Additional info: No triage Advised patient of last resulted H&H on 09/28/23 were within normal lab value range.  Protocols used: PCP Call - No Triage-A-AH

## 2024-03-12 ENCOUNTER — Other Ambulatory Visit: Payer: Self-pay | Admitting: Student

## 2024-03-12 DIAGNOSIS — I5032 Chronic diastolic (congestive) heart failure: Secondary | ICD-10-CM | POA: Diagnosis not present

## 2024-03-12 DIAGNOSIS — N184 Chronic kidney disease, stage 4 (severe): Secondary | ICD-10-CM | POA: Diagnosis not present

## 2024-03-12 DIAGNOSIS — I10 Essential (primary) hypertension: Secondary | ICD-10-CM | POA: Diagnosis not present

## 2024-03-14 LAB — BASIC METABOLIC PANEL WITH GFR
BUN/Creatinine Ratio: 29 — ABNORMAL HIGH (ref 12–28)
BUN: 57 mg/dL — ABNORMAL HIGH (ref 10–36)
CO2: 17 mmol/L — ABNORMAL LOW (ref 20–29)
Calcium: 9.4 mg/dL (ref 8.7–10.3)
Chloride: 105 mmol/L (ref 96–106)
Creatinine, Ser: 1.97 mg/dL — ABNORMAL HIGH (ref 0.57–1.00)
Glucose: 140 mg/dL — ABNORMAL HIGH (ref 70–99)
Potassium: 5.2 mmol/L (ref 3.5–5.2)
Sodium: 142 mmol/L (ref 134–144)
eGFR: 23 mL/min/1.73 — ABNORMAL LOW (ref >59)

## 2024-03-14 LAB — MAGNESIUM: Magnesium: 2.2 mg/dL (ref 1.6–2.3)

## 2024-03-14 LAB — VITAMIN D 25 HYDROXY (VIT D DEFICIENCY, FRACTURES): Vit D, 25-Hydroxy: 19.5 ng/mL — ABNORMAL LOW (ref 30.0–100.0)

## 2024-03-14 LAB — LIPID PANEL W/O CHOL/HDL RATIO
Cholesterol, Total: 212 mg/dL — ABNORMAL HIGH (ref 100–199)
HDL: 57 mg/dL (ref >39)
LDL Chol Calc (NIH): 135 mg/dL — ABNORMAL HIGH (ref 0–99)
Triglycerides: 111 mg/dL (ref 0–149)
VLDL Cholesterol Cal: 20 mg/dL (ref 5–40)

## 2024-03-14 LAB — PARATHYROID HORMONE, INTACT (NO CA): PTH: 95 pg/mL — AB (ref 15–65)

## 2024-03-14 LAB — TSH: TSH: 3.33 u[IU]/mL (ref 0.450–4.500)

## 2024-03-17 ENCOUNTER — Ambulatory Visit: Payer: Self-pay | Admitting: Student

## 2024-04-30 DIAGNOSIS — Z23 Encounter for immunization: Secondary | ICD-10-CM | POA: Diagnosis not present

## 2024-05-02 ENCOUNTER — Encounter: Admitting: Student

## 2024-05-09 ENCOUNTER — Encounter: Admitting: Student

## 2024-05-21 ENCOUNTER — Telehealth: Payer: Self-pay

## 2024-05-21 NOTE — Telephone Encounter (Signed)
 Copied from CRM #[id number]. Topic: Clinical - Medication Question >> May 21, 2024 11:35 AM Jamie Hodges ORN wrote: Reason for CRM: patient have question if still should be taking the medication chlorthalidone  (HYGROTON ) 25 MG tablet ,reason patient is out of the medication  She understand that Beamer,Victoria will be leaving the practice

## 2024-05-21 NOTE — Telephone Encounter (Signed)
 Patient is aware that she is to still take medication and a refill should be on file at her pharmacy

## 2024-05-23 ENCOUNTER — Encounter: Admitting: Student

## 2024-06-08 DIAGNOSIS — Z23 Encounter for immunization: Secondary | ICD-10-CM | POA: Diagnosis not present

## 2024-06-11 NOTE — Telephone Encounter (Unsigned)
 Copied from CRM [id number]. Topic: Clinical - Request for Lab/Test Order >> Jun 11, 2024 11:48 AM Maisie BROCKS wrote: Reason for CRM: pt called to request for lab orders to be sent so she can have results by the time of her np appt w/ dr. Bennett. Please call and advise.

## 2024-06-11 NOTE — Telephone Encounter (Signed)
 Spoke to pt. Advised her that lab orders will have to wait until the pt is seen by Dr Bennett.

## 2024-07-10 NOTE — Progress Notes (Unsigned)
 Cardiology Office Note    Date:  07/11/2024   ID:  [name], DOB 03-May-1928, MRN [id number]  PCP:  Abdul Fine, MD (Inactive)  Cardiologist:  Evalene Lunger, MD  Electrophysiologist:  None   Chief Complaint: Follow up  History of Present Illness:   Jamie Hodges is a 89 y.o. female with history of PAF on apixaban , diastolic dysfunction, valvular heart disease with mild mitral regurgitation and stenosis as well as mild to moderate aortic insufficiency, ischemic enteritis, aortic atherosclerosis, anemia, CKD stage IV, and HTN who presents for follow-up of HFpEF and A-fib.   She was admitted to the hospital in 02/2021 with ischemic enteritis and was A-fib with RVR.  She was consulted on by outside cardiology group and managed with IV diltiazem , digoxin , and amiodarone .  Echo during the admission showed an EF of 60 to 65%, no regional wall motion abnormalities, moderate concentric LVH, grade 1 diastolic dysfunction, normal RV systolic function and ventricular cavity size, mild biatrial enlargement, mild to moderate mitral regurgitation, severe mitral annular calcification, mild aortic valve sclerosis without evidence of stenosis, and an estimated right atrial pressure of 3 mmHg.   She established care with our office in 04/2021 for ongoing management of A-fib.  She was in sinus rhythm with recommendation to continue amiodarone , metoprolol  tartrate, diltiazem  and apixaban .  Note indicates that Dr. Lunger spoke with the patient's son, who is a physician out of state, with preference to avoid invasive/hospital-based procedures.   She was admitted in 07/2021 with ischemic enteritis complicated by SBO versus ileus.  Sinus rhythm on EKG.  Hospital course also notable for downtrending hemoglobin during the admission with an initial hemoglobin of 11.3 trending to 8.4 at time of discharge.  Follow-up hemoglobin in 10/2021 improved at 11.5.   She was admitted to the hospital in 06/2022 with recurrent  ischemic enteritis.  She declined surgical consultation.  She was treated with IV fluids, analgesics, and empiric antibiotics.  Internal medicine discontinued amiodarone  given concerns for prolonged QTc on EKG dated 07/04/2022.  However, upon reviewing that EKG, QTc was unable to be calculated due to significant baseline artifact and wandering.  EKG performed 2 days later showed improved QTc of 475.   She was seen in the office in 11/2022 and was doing well from a cardiac perspective.  She was able to swim laps in the pool at a slow pace without significant dyspnea, though reported a long history of exertional shortness of breath when making the bed or doing household chores.  In the setting of this shortness of breath, she underwent echo in 12/2022 that showed an EF of 55 to 60%, no regional wall motion abnormalities, mild LVH, grade 1 diastolic dysfunction, normal RV systolic function and ventricular cavity size, mildly dilated left atrium, degenerative mitral valve with mild regurgitation and mild stenosis, mild to moderate aortic insufficiency, aortic valve sclerosis without evidence of stenosis, and an estimated right atrial pressure of 3 mmHg.   She was seen in the office in 09/2023 and was doing well noting a slight progression of shortness of breath.  She continued to swim 20 laps in the pool at a slow pace 3 days/week without symptoms of dyspnea.  She was agreeable to echo, though reported she would not want to pursue further cardiac testing beyond this.  In this setting she underwent echo in 10/2023 that showed an EF of 60 to 65%, no regional wall motion abnormalities, grade 1 diastolic dysfunction normal RV systolic function and ventricular cavity  size, mildly to moderately dilated left atrium, mild mitral regurgitation aortic insufficiency with aortic valve sclerosis without evidence of stenosis, and an estimated right atrial pressure of 3 mmHg.  Losartan  has subsequently been discontinued secondary to  issues with hyperkalemia.  She was most recently seen in the office in 12/2023 continuing to note chronic stable shortness of breath.  She continued to swim 20 laps in the pool at a slow pace 3 days/week without dyspnea.  BP at home was elevated, largely in the 160s to 180s mmHg systolic with readings taking in the mornings before diltiazem .  She continued to decline further cardiac testing.  She was started back on Toprol -XL at 25 mg nightly with continuation of Cardizem  CD 360 mg daily.  She comes in today and remains without symptoms of angina or cardiac decompensation.  Chronic dyspnea is stable.  She continues to swim 20 laps in the pool at a slow pace 3 days/week without significant shortness of breath.  No frank chest pain.  No palpitations.  No dizziness, presyncope, or syncope.  Blood pressures have improved, currently taking diltiazem , losartan , and Toprol .  Continues to decline further cardiac testing.  Does not have any acute cardiac concerns at this time.   Labs independently reviewed: 02/2024 - magnesium  2.2, TSH normal, TC 212, TG 111, HDL 57, LDL 135, BUN 57, serum creatinine 1.97, potassium 5.2 09/2023 - Hgb 12.3, PLT 202 01/2023 - TSH normal, albumin 4.3, AST/ALT normal  Past Medical History:  Diagnosis Date   Atrial fibrillation (HCC) 2010   One time during hospital while sick with severe diarrhea   C. difficile colitis    3/10  Severe C. dif ---had brief atrial fib then. Cath shows some blockages but no intervention   CAD (coronary artery disease) 2010   minor blockages --no intervention indicated   Hx of colonic polyps    Hyperlipidemia    Hypertension     Past Surgical History:  Procedure Laterality Date   CHOLECYSTECTOMY  3/16   ERCP N/A 01/14/2015   Procedure: ENDOSCOPIC RETROGRADE CHOLANGIOPANCREATOGRAPHY (ERCP);  Surgeon: Rogelia Copping, MD;  Location: [facility] ENDOSCOPY;  Service: Endoscopy;  Laterality: N/A;   TONSILLECTOMY AND ADENOIDECTOMY      Current  Medications: Current Meds  Medication Sig   acetaminophen  (TYLENOL ) 325 MG tablet Take 650 mg by mouth every 6 (six) hours as needed.   apixaban  (ELIQUIS ) 2.5 MG TABS tablet Take 1 tablet (2.5 mg total) by mouth 2 (two) times daily.   chlorthalidone  (HYGROTON ) 25 MG tablet Take 1 tablet (25 mg total) by mouth daily.   diltiazem  (TIAZAC ) 360 MG 24 hr capsule Take 1 capsule (360 mg total) by mouth daily.   HYDROcodone -acetaminophen  (NORCO/VICODIN) 5-325 MG tablet Take 1 tablet by mouth every 6 (six) hours as needed for moderate pain (pain score 4-6).   losartan  (COZAAR ) 25 MG tablet Take 1 tablet (25 mg total) by mouth daily.   meclizine  (ANTIVERT ) 25 MG tablet TAKE 1 TABLET BY MOUTH THREE TIMES DAILY AS NEEDED FOR NAUSEA AND VOMITING   metoprolol  succinate (TOPROL -XL) 50 MG 24 hr tablet Take 1 tablet (50 mg total) by mouth daily. Take with or immediately following a meal.    Allergies:   Codeine sulfate, Crestor [rosuvastatin calcium ], Other, and Penicillins   Social History   Socioeconomic History   Marital status: Widowed    Spouse name: Not on file   Number of children: 3   Years of education: Not on file   Highest education  level: Not on file  Occupational History   Occupation: retired 1st grade teacher  Tobacco Use   Smoking status: Former    Current packs/day: 0.00    Types: Cigarettes    Quit date: 08/16/1978    Years since quitting: 45.9    Passive exposure: Never   Smokeless tobacco: Never  Vaping Use   Vaping status: Never Used  Substance and Sexual Activity   Alcohol use: Never   Drug use: No   Sexual activity: Not on file  Other Topics Concern   Not on file  Social History Narrative   Has living will.    Son Octaviano (MD) to make health care decisions.--then son Jeralyn   Has DNR order in past and requests again--done   No tube feeds if cognitively unaware         Social Drivers of Health   Financial Resource Strain: Not on file  Food Insecurity: No Food  Insecurity (01/04/2024)   Hunger Vital Sign    Worried About Running Out of Food in the Last Year: Never true    Ran Out of Food in the Last Year: Never true  Transportation Needs: No Transportation Needs (01/04/2024)   PRAPARE - Administrator, Civil Service (Medical): No    Lack of Transportation (Non-Medical): No  Physical Activity: Not on file  Stress: Not on file  Social Connections: Not on file     Family History:  The patient's family history includes COPD in her mother. There is no history of Coronary artery disease, Diabetes, or Cancer.  ROS:   12-point review of systems is negative unless otherwise noted in the HPI.   EKGs/Labs/Other Studies Reviewed:    Studies reviewed were summarized above. The additional studies were reviewed today:  2D echo 10/19/2023: 1. Left ventricular ejection fraction, by estimation, is 60 to 65%. Left  ventricular ejection fraction by 3D volume is 61 %. The left ventricle has  normal function. The left ventricle has no regional wall motion  abnormalities. Left ventricular diastolic   parameters are consistent with Grade I diastolic dysfunction (impaired  relaxation). The average left ventricular global longitudinal strain is  -18.9 %. The global longitudinal strain is normal.   2. Right ventricular systolic function is normal. The right ventricular  size is normal. Tricuspid regurgitation signal is inadequate for assessing  PA pressure.   3. Left atrial size was mild to moderately dilated.   4. The mitral valve is normal in structure. Mild mitral valve  regurgitation. No evidence of mitral stenosis.   5. The aortic valve is normal in structure. Aortic valve regurgitation is  mild to moderate. Aortic valve sclerosis is present, with no evidence of  aortic valve stenosis.   6. The inferior vena cava is normal in size with greater than 50%  respiratory variability, suggesting right atrial pressure of 3 mmHg.  __________   2D echo  12/29/2022: 1. Left ventricular ejection fraction, by estimation, is 55 to 60%. Left  ventricular ejection fraction by PLAX is 58 %. The left ventricle has  normal function. The left ventricle has no regional wall motion  abnormalities. There is mild left ventricular  hypertrophy. Left ventricular diastolic parameters are consistent with  Grade I diastolic dysfunction (impaired relaxation).   2. Right ventricular systolic function is normal. The right ventricular  size is normal.   3. Left atrial size was mildly dilated.   4. Mean mitral valve gradient .. The mitral valve is degenerative.  Mild mitral valve regurgitation. Mild mitral stenosis.   5. The aortic valve is tricuspid. Aortic valve regurgitation is mild to  moderate. Aortic valve sclerosis/calcification is present, without any  evidence of aortic stenosis.   6. The inferior vena cava is normal in size with greater than 50%  respiratory variability, suggesting right atrial pressure of 3 mmHg.   Comparison(s): 02/27/21: 60-65% mild LVH/MR, BAE  __________   2D echo 02/27/2021: 1. Left ventricular ejection fraction, by estimation, is 60 to 65%. The  left ventricle has normal function. The left ventricle has no regional  wall motion abnormalities. There is moderate concentric left ventricular  hypertrophy. Left ventricular  diastolic parameters are consistent with Grade I diastolic dysfunction  (impaired relaxation).   2. Right ventricular systolic function is normal. The right ventricular  size is normal.   3. Left atrial size was mildly dilated.   4. Right atrial size was mildly dilated.   5. The mitral valve is normal in structure. Mild to moderate mitral valve  regurgitation. No evidence of mitral stenosis. Severe mitral annular  calcification.   6. The aortic valve is normal in structure. Aortic valve regurgitation is  not visualized. Mild aortic valve sclerosis is present, with no evidence  of aortic valve stenosis.    7. The inferior vena cava is normal in size with greater than 50%  respiratory variability, suggesting right atrial pressure of 3 mmHg.   EKG:  EKG is ordered today.  The EKG ordered today demonstrates NSR, 82 bpm, no acute ST-T changes  Recent Labs: 09/28/2023: Hemoglobin 12.3; Platelets 202 03/12/2024: BUN 57; Creatinine, Ser 1.97; Magnesium  2.2; Potassium 5.2; Sodium 142; TSH 3.330  Recent Lipid Panel    Component Value Date/Time   CHOL 212 (H) 03/12/2024 1006   TRIG 111 03/12/2024 1006   HDL 57 03/12/2024 1006   CHOLHDL 3 02/02/2023 1128   VLDL 14.0 02/02/2023 1128   LDLCALC 135 (H) 03/12/2024 1006   LDLDIRECT 161.9 01/13/2011 1220    PHYSICAL EXAM:    VS:  BP (!) 144/78 (BP Location: Left Arm, Patient Position: Sitting, Cuff Size: Normal)   Pulse 82   Ht 5' 7 (1.702 m)   Wt 148 lb (67.1 kg)   SpO2 96%   BMI 23.18 kg/m   BMI: Body mass index is 23.18 kg/m.  Physical Exam Vitals reviewed.  Constitutional:      Appearance: She is well-developed.  HENT:     Head: Normocephalic and atraumatic.  Eyes:     General:        Right eye: No discharge.        Left eye: No discharge.  Cardiovascular:     Rate and Rhythm: Normal rate and regular rhythm.     Heart sounds: S1 normal and S2 normal. Heart sounds not distant. No midsystolic click and no opening snap. Murmur heard.     Systolic murmur is present with a grade of 2/6 at the apex.     No friction rub.  Pulmonary:     Effort: Pulmonary effort is normal. No respiratory distress.     Breath sounds: Normal breath sounds. No decreased breath sounds, wheezing, rhonchi or rales.  Musculoskeletal:     Cervical back: Normal range of motion.     Right lower leg: No edema.     Left lower leg: No edema.  Skin:    General: Skin is warm and dry.     Nails: There is no clubbing.  Neurological:  Mental Status: She is alert and oriented to person, place, and time.  Psychiatric:        Speech: Speech normal.         Behavior: Behavior normal.        Thought Content: Thought content normal.        Judgment: Judgment normal.     Wt Readings from Last 3 Encounters:  07/11/24 148 lb (67.1 kg)  02/29/24 153 lb (69.4 kg)  02/01/24 148 lb 3.2 oz (67.2 kg)     ASSESSMENT & PLAN:   PAF: Maintaining sinus rhythm on diltiazem  360 mg daily and Toprol -XL 50 mg daily.  CHA2DS2-VASc at least 5 (HTN, age x 2, vascular disease, sex category).  Remains on apixaban  2.5 mg twice daily (age 51 and serum creatinine).  No falls or symptoms concerning for bleeding.  Diastolic dysfunction with history of exertional dyspnea: Euvolemic and well compensated.  Not requiring a standing loop diuretic.  Dyspnea is overall stable.  Recent echo showed preserved LV systolic function with grade 1 diastolic dysfunction and mild mitral regurgitation with mild to moderate aortic insufficiency without evidence of aortic stenosis.  Continues to decline further cardiac testing.  Valvular heart disease: Echo in 10/2023 showed mild mitral regurgitation with mild to moderate aortic insufficiency with aortic valve sclerosis without evidence of stenosis.  Medical management.  HTN: Blood pressure is mildly elevated in the office today, though improved.  Remains on diltiazem  360 mg daily, Toprol -XL 50 mg, and losartan  25 mg.  Aortic atherosclerosis: LDL 135 in 02/2024.  Not currently on a statin.  Given advanced age, this seems reasonable.  CKD stage IV: Managed by PCP.  Avoid nephrotoxic agents.  Remains on ARB.     Disposition: F/u with Dr. Gollan or an APP in 6 months.   Medication Adjustments/Labs and Tests Ordered: Current medicines are reviewed at length with the patient today.  Concerns regarding medicines are outlined above. Medication changes, Labs and Tests ordered today are summarized above and listed in the Patient Instructions accessible in Encounters.   Signed, Bernardino Bring, PA-C 07/11/2024 12:36 PM     [facility] -  [place] [address] [place], [place] [address] [phone]

## 2024-07-11 ENCOUNTER — Encounter: Payer: Self-pay | Admitting: Physician Assistant

## 2024-07-11 ENCOUNTER — Ambulatory Visit: Attending: Physician Assistant | Admitting: Physician Assistant

## 2024-07-11 VITALS — BP 144/78 | HR 82 | Ht 67.0 in | Wt 148.0 lb

## 2024-07-11 DIAGNOSIS — I5189 Other ill-defined heart diseases: Secondary | ICD-10-CM | POA: Insufficient documentation

## 2024-07-11 DIAGNOSIS — I480 Paroxysmal atrial fibrillation: Secondary | ICD-10-CM | POA: Diagnosis not present

## 2024-07-11 DIAGNOSIS — I351 Nonrheumatic aortic (valve) insufficiency: Secondary | ICD-10-CM | POA: Diagnosis not present

## 2024-07-11 DIAGNOSIS — I340 Nonrheumatic mitral (valve) insufficiency: Secondary | ICD-10-CM | POA: Insufficient documentation

## 2024-07-11 DIAGNOSIS — I700 Atherosclerosis of aorta: Secondary | ICD-10-CM | POA: Insufficient documentation

## 2024-07-11 DIAGNOSIS — N184 Chronic kidney disease, stage 4 (severe): Secondary | ICD-10-CM | POA: Diagnosis not present

## 2024-07-11 DIAGNOSIS — I10 Essential (primary) hypertension: Secondary | ICD-10-CM | POA: Insufficient documentation

## 2024-07-11 NOTE — Patient Instructions (Signed)
 Medication Instructions:  Your physician recommends that you continue on your current medications as directed. Please refer to the Current Medication list given to you today.   *If you need a refill on your cardiac medications before your next appointment, please call your pharmacy*  Lab Work: None ordered at this time   Follow-Up: At [facility], you and your health needs are our priority.  As part of our continuing mission to provide you with exceptional heart care, our providers are all part of one team.  This team includes your primary Cardiologist (physician) and Advanced Practice Providers or APPs (Physician Assistants and Nurse Practitioners) who all work together to provide you with the care you need, when you need it.  Your next appointment:   6 month(s)  Provider:   You may see Timothy Gollan, MD or Varney Gentleman, PA-C

## 2024-07-16 ENCOUNTER — Ambulatory Visit

## 2024-07-16 VITALS — BP 138/64 | HR 86 | Temp 97.3°F | Ht 67.0 in | Wt 149.0 lb

## 2024-07-16 DIAGNOSIS — R32 Unspecified urinary incontinence: Secondary | ICD-10-CM | POA: Insufficient documentation

## 2024-07-16 DIAGNOSIS — N184 Chronic kidney disease, stage 4 (severe): Secondary | ICD-10-CM | POA: Diagnosis not present

## 2024-07-16 DIAGNOSIS — G4700 Insomnia, unspecified: Secondary | ICD-10-CM | POA: Diagnosis not present

## 2024-07-16 DIAGNOSIS — E559 Vitamin D deficiency, unspecified: Secondary | ICD-10-CM

## 2024-07-16 DIAGNOSIS — I10 Essential (primary) hypertension: Secondary | ICD-10-CM | POA: Diagnosis not present

## 2024-07-16 LAB — BASIC METABOLIC PANEL WITH GFR
BUN: 54 mg/dL — ABNORMAL HIGH (ref 6–23)
CO2: 25 meq/L (ref 19–32)
Calcium: 9.4 mg/dL (ref 8.4–10.5)
Chloride: 107 meq/L (ref 96–112)
Creatinine, Ser: 2.02 mg/dL — ABNORMAL HIGH (ref 0.40–1.20)
GFR: 20.72 mL/min — ABNORMAL LOW (ref >60.00)
Glucose, Bld: 96 mg/dL (ref 70–99)
Potassium: 4.8 meq/L (ref 3.5–5.1)
Sodium: 140 meq/L (ref 135–145)

## 2024-07-16 MED ORDER — VITAMIN D 50 MCG (2000 UT) PO CAPS: 1.0000 | ORAL_CAPSULE | Freq: Every day | ORAL | 1 refills | Status: AC

## 2024-07-16 MED ORDER — DOXEPIN HCL 3 MG PO TABS: 3.0000 mg | ORAL_TABLET | Freq: Every evening | ORAL | 0 refills | Status: AC

## 2024-07-16 NOTE — Progress Notes (Signed)
 Subjective:   This visit was conducted in person. The patient gave informed consent to the use of Abridge AI technology to record the contents of the encounter as documented below.   Patient ID: Jamie Hodges, female    DOB: 1928/07/17, 89 y.o.   MRN: [id number]   Discussed the use of AI scribe software for clinical note transcription with the patient, who gave verbal consent to proceed.  History of Present Illness Jamie Hodges is a 89 year old female with ischemic enteritis who presents for management of abdominal pain and other chronic conditions.  She experiences abdominal pain and occasionally uses Norco for relief. Norco was prescribed in 2023. She prefers conservative management and wishes to avoid surgical interventions like a colostomy.  She has chronic shortness of breath, which worsens with physical exertion such as making a bed. Relief is found by lying down, and she is able to lie flat without difficulty. Her blood pressure is typically higher than the current reading of 138/64 mmHg, often exceeding 150 mmHg. She monitors her blood pressure at home sporadically and notes that it is usually higher than desired for kidney health.  She has been incontinent of urine for over two years, wearing Depends to manage the condition. She experiences both stress incontinence and urge incontinence, with significant urine volume. She performs Kegel exercises but has not noticed improvement yet.  She has difficulty with sleep, taking up to two hours to fall asleep and waking multiple times during the night. She has tried melatonin 10 mg with limited success. She feels tired during the day and has been experiencing sleep issues for several years.  Her current medications include chlorthalidone , Eliquis  2.5 mg twice daily, and Tylenol  as needed. She is not currently taking a vitamin D  supplement despite low levels.    Review of Systems  All other systems reviewed and are negative.        Allergies  Allergen Reactions   Codeine Sulfate     REACTION: rash/ welps   Crestor [Rosuvastatin Calcium ]     myalgia   Other     Wild rice   Penicillins Hives and Rash    Has patient had a PCN reaction causing immediate rash, facial/tongue/throat swelling, SOB or lightheadedness with hypotension: Yes Has patient had a PCN reaction causing severe rash involving mucus membranes or skin necrosis: No Has patient had a PCN reaction that required hospitalization: No Has patient had a PCN reaction occurring within the last 10 years: Yes If all of the above answers are NO, then may proceed with Cephalosporin use.    Current Outpatient Medications on File Prior to Visit  Medication Sig Dispense Refill   acetaminophen  (TYLENOL ) 325 MG tablet Take 650 mg by mouth every 6 (six) hours as needed.     apixaban  (ELIQUIS ) 2.5 MG TABS tablet Take 1 tablet (2.5 mg total) by mouth 2 (two) times daily. 180 tablet 1   chlorthalidone  (HYGROTON ) 25 MG tablet Take 1 tablet (25 mg total) by mouth daily. 90 tablet 1   diltiazem  (TIAZAC ) 360 MG 24 hr capsule Take 1 capsule (360 mg total) by mouth daily. 90 capsule 1   HYDROcodone -acetaminophen  (NORCO/VICODIN) 5-325 MG tablet Take 1 tablet by mouth every 6 (six) hours as needed for moderate pain (pain score 4-6). 60 tablet 0   losartan  (COZAAR ) 25 MG tablet Take 1 tablet (25 mg total) by mouth daily. 90 tablet 3   meclizine  (ANTIVERT ) 25 MG tablet TAKE 1 TABLET BY MOUTH THREE  TIMES DAILY AS NEEDED FOR NAUSEA AND VOMITING 60 tablet 0   metoprolol  succinate (TOPROL -XL) 50 MG 24 hr tablet Take 1 tablet (50 mg total) by mouth daily. Take with or immediately following a meal. 90 tablet 3   No current facility-administered medications on file prior to visit.    BP 138/64 (BP Location: Right Arm, Patient Position: Sitting, Cuff Size: Normal)   Pulse 86   Temp (!) 97.3 F (36.3 C) (Oral)   Ht 5' 7 (1.702 m)   Wt 149 lb (67.6 kg)   SpO2 99%   BMI 23.34 kg/m    Objective:      Physical Exam GENERAL: Alert, cooperative, well developed, no acute distress. HEAD: Normocephalic atraumatic. EYES: Extraocular movements intact BL, pupils round, equal and reactive to light BL, conjunctivae normal BL. EXTREMITIES: No cyanosis or edema. NEUROLOGICAL: Oriented to person, place and time, ambulates with cane, moves all extremities without gross motor or sensory deficit.       Assessment & Plan:   Assessment & Plan Insomnia New patient, past medical and social history thoroughly reviewed and updated in chart. Lives independently at [facility], former pt of Dr Abdul, saw Dr Letvak for CPE in 2024. Chronic insomnia with ineffective melatonin use. Doxepin deemed safe even with advanced CKD.  - Prescribed doxepin 3 mg 30 minutes before bedtime. - Monitor for side effects: dry mouth, urinary retention, constipation, blurry vision, drowsiness, dizziness. - Follow-up in a few weeks to assess effectiveness and adjust dosage.  CKD Hypertension Blood pressure in office today 138/64. Ideal Goal <130/90 to protect kidneys, given advanced age, can de-escalate goal to less than 140/90. Per pt, home readings usually in 140s-150s systolic.  Concern that she is inadequately controlled, will get a few more home readings before augmenting medication.  - Check blood pressure daily for 7 days and bring to next visit - Adjust medications if readings consistently >140/90. - Continue losartan  25 mg daily and chlorthalidone  25 mg daily   Urinary incontinence Chronic urge and stress incontinence, managed with Depends. Prefers conservative management.  Question of whether there is also an element of OAB. - Continue Kegel exercises daily for 2-3 weeks, see whether this improves things. - Reassess in-office after 2-3 weeks.  Vitamin D  deficiency Vitamin D  level 19, indicating deficiency. Not on supplement. - Prescribed vitamin D  supplement 2000 units daily. - Repeat  vitamin D  level in 3 months   Return in about 2 weeks (around 07/30/2024) for CPE, doxepin and BP.   Meredyth Hornung K Zienna Ahlin, MD  07/16/24     Contains text generated by Abridge.

## 2024-07-16 NOTE — Patient Instructions (Addendum)
 Thank you for visiting [facility] today! Here's what we talked about: - Check blood pressure daily for 7 days and bring to next visit, goal is to stay as close as possible to 140/90 - START Doxepin nightly and 1 tablet of vitamin D  daily

## 2024-07-19 ENCOUNTER — Ambulatory Visit: Payer: Self-pay

## 2024-07-26 ENCOUNTER — Other Ambulatory Visit: Payer: Self-pay

## 2024-07-26 NOTE — Telephone Encounter (Unsigned)
 Copied from CRM #[id number]. Topic: Clinical - Medication Refill >> Jul 26, 2024  9:20 AM Rosina BIRCH wrote: Medication: diltiazem  (pharmacy did not fill because it was from a previous doctor and she is a new patient with MD Bennett)  Has the patient contacted their pharmacy? Yes (Agent: If no, request that the patient contact the pharmacy for the refill. If patient does not wish to contact the pharmacy document the reason why and proceed with request.) (Agent: If yes, when and what did the pharmacy advise?)  This is the patient's preferred pharmacy:  [facility] [address], [place] - [address] [place] [address] Phone: [phone] Fax: [phone]  Is this the correct pharmacy for this prescription? Yes If no, delete pharmacy and type the correct one.   Has the prescription been filled recently? No  Is the patient out of the medication? Yes  Has the patient been seen for an appointment in the last year OR does the patient have an upcoming appointment? Yes  Can we respond through MyChart? Yes  Agent: Please be advised that Rx refills may take up to 3 business days. We ask that you follow-up with your pharmacy.

## 2024-07-27 MED ORDER — DILTIAZEM HCL ER BEADS 360 MG PO CP24: 360.0000 mg | ORAL_CAPSULE | Freq: Every day | ORAL | 1 refills | Status: AC

## 2024-08-17 ENCOUNTER — Other Ambulatory Visit: Payer: Self-pay

## 2024-08-17 DIAGNOSIS — I5032 Chronic diastolic (congestive) heart failure: Secondary | ICD-10-CM

## 2024-08-17 DIAGNOSIS — I10 Essential (primary) hypertension: Secondary | ICD-10-CM

## 2024-08-17 DIAGNOSIS — N184 Chronic kidney disease, stage 4 (severe): Secondary | ICD-10-CM

## 2024-08-19 MED ORDER — CHLORTHALIDONE 25 MG PO TABS: 25.0000 mg | ORAL_TABLET | Freq: Every day | ORAL | 3 refills | Status: AC

## 2024-08-30 ENCOUNTER — Ambulatory Visit: Payer: Self-pay

## 2024-08-30 ENCOUNTER — Ambulatory Visit

## 2024-08-30 VITALS — BP 116/62 | HR 58 | Ht 67.0 in | Wt 147.0 lb

## 2024-08-30 DIAGNOSIS — Z0000 Encounter for general adult medical examination without abnormal findings: Secondary | ICD-10-CM | POA: Diagnosis not present

## 2024-08-30 DIAGNOSIS — R0609 Other forms of dyspnea: Secondary | ICD-10-CM

## 2024-08-30 DIAGNOSIS — I10 Essential (primary) hypertension: Secondary | ICD-10-CM

## 2024-08-30 DIAGNOSIS — I5032 Chronic diastolic (congestive) heart failure: Secondary | ICD-10-CM

## 2024-08-30 DIAGNOSIS — G4700 Insomnia, unspecified: Secondary | ICD-10-CM

## 2024-08-30 LAB — BRAIN NATRIURETIC PEPTIDE: Pro B Natriuretic peptide (BNP): 202 pg/mL — ABNORMAL HIGH (ref 1.0–100.0)

## 2024-08-30 MED ORDER — FUROSEMIDE 20 MG PO TABS: ORAL_TABLET | ORAL | 0 refills | Status: DC

## 2024-08-30 MED ORDER — LOSARTAN POTASSIUM 25 MG PO TABS: 50.0000 mg | ORAL_TABLET | Freq: Every day | ORAL | Status: AC

## 2024-08-30 NOTE — Assessment & Plan Note (Signed)
 Age-appropriate goal is less than 150/90, home readings are 150s-160s systolic, will increase losartan  dose as below. Plan for repeat BMP in 2 weeks to check potassium.  She was send 7 days of blood pressure readings on the increased dose. - Continue chlorthalidone  25 mg daily Orders:   losartan  (COZAAR ) 25 MG tablet; Take 2 tablets (50 mg total) by mouth daily.   Basic metabolic panel with GFR; Future

## 2024-08-30 NOTE — Patient Instructions (Addendum)
 Thank you for visiting [facility] today! Here's what we talked about: - START taking 2 tablets of Losartan , Continue Chlorthalidone  as is. Send 7 days of blood pressure readings after dose increase. -Come back for labs in 2 weeks - Get RSV vaccine from [facility] or [facility] - Bring a scan of your Living Will to our clinic for our records - Get over the counter earwax removal aid and use daily both sides fro 7-10 days - Return to clinic if sleep or blood pressure are not controlled

## 2024-08-30 NOTE — Progress Notes (Signed)
 "  Assessment & Plan:   This visit was conducted in person. The patient gave informed consent to the use of [employer]bridge AI technology to record the contents of the encounter as documented below.   Assessment & Plan Essential hypertension Age-appropriate goal is less than 150/90, home readings are 150s-160s systolic, will increase losartan  dose as below. Plan for repeat BMP in 2 weeks to check potassium.  She was send 7 days of blood pressure readings on the increased dose. - Continue chlorthalidone  25 mg daily Orders:   losartan  (COZAAR ) 25 MG tablet; Take 2 tablets (50 mg total) by mouth daily.   Basic metabolic panel with GFR; Future  Insomnia, unspecified type Doxepin  was started last visit, however, has only been used on 2 occasions, difficult to ascertain if efficacious, will refrain from making any changes at this time. - Continue doxepin  3 mg nightly as needed    Exertional dyspnea Reports of dyspnea on minimal exertional home tasks such as making the bed, she does have a history of CHF, on clinical exam there is no pitting edema or pulmonary crackles, will order BNP, if elevated, would consider short dose of Lasix . Orders:   Brain natriuretic peptide  Preventative health care I have personally reviewed and have noted: 1. The patient's medical and social history 2. Their use of alcohol, tobacco or illicit drugs 3. Their current medications and supplements 4. The patient's functional ability including ADL's, fall risks, home safety risks and hearing or visual impairment. 5. Diet and physical activities 6. Evidence for depression or mood disorder    Labs: only BMP Immunizations: Declines prevnar, needs RSV from pharmacy Diet and Exercise: Good, needs to get back into swimming Sleep and mood: Good only needed doxepin  twice, no mood concerns Dental and vision:UTD ADLs and IADLs: Capable Cognitive: Intact to orientation, naming, recall and repetition Fall risk and home  safety: No concerns Health counselling given:   Advanced Directives Patient does have advanced directives including living will. She does not have a copy in the electronic medical record.      Follow-up: Return in about 1 year (around 08/30/2025) for CPE with fasting labs prior.        Subjective:   Patient ID: Maymunah Stegemann, female    DOB: 11-Jan-1928  Age: 89 y.o. MRN: [id number]  Patient Care Team: Bennett Reuben POUR, MD as PCP - General (Family Medicine) Perla Evalene PARAS, MD as PCP - Cardiology (Cardiology)   CC:  Chief Complaint  Patient presents with   Medicare Wellness    Pt is not interested in doing visit with [facility] Wellness nurse.  Continues to be out of breath. Does not have GI for the Ischemic Enteritis. Son wants to make sure she does home treatment plan to stay out of the ER.  Brought a list of BP readings.    Jamariah Tony is a 89 y.o. female here for f/u on chronic conditions  Discussed the use of AI scribe software for clinical note transcription with the patient, who gave verbal consent to proceed.  History of Present Illness Says mostly she sleeps okay, she has only needed the doxepin  twice, cannot really see if it is working for her. Says she has been checking her blood pressure readings and has brought them with her.  She otherwise denies any headaches, chest pain or vision changes. Says she has noticed over the past year being more winded when doing things around the house like making the bed.  Denies noticing any weight  gain or swelling in her legs.   Depression Screening;    07/16/2024   10:36 AM 02/01/2024    2:14 PM 01/04/2024    2:14 PM 02/02/2023   10:50 AM 02/23/2019   11:10 AM 02/17/2018   12:08 PM 03/02/2017    9:48 AM  PHQ 2/9 Scores  PHQ - 2 Score 0 0 0 0 0 0 0     Anxiety Screening:     No data to display           ROS: Negative unless specifically indicated above in HPI.       Objective:     BP 116/62 (BP Location: Left Arm,  Patient Position: Sitting, Cuff Size: Normal)   Pulse (!) 58   Ht 5' 7 (1.702 m)   Wt 147 lb (66.7 kg)   SpO2 99%   BMI 23.02 kg/m   Physical Exam Vitals and nursing note reviewed.  Constitutional:      Appearance: Normal appearance.  HENT:     Head: Normocephalic and atraumatic.     Right Ear: Tympanic membrane, ear canal and external ear normal.     Left Ear: Tympanic membrane, ear canal and external ear normal.     Nose: No congestion or rhinorrhea.     Mouth/Throat:     Mouth: Mucous membranes are moist.     Pharynx: No oropharyngeal exudate or posterior oropharyngeal erythema.  Eyes:     Extraocular Movements: Extraocular movements intact.     Conjunctiva/sclera: Conjunctivae normal.     Pupils: Pupils are equal, round, and reactive to light.  Cardiovascular:     Rate and Rhythm: Normal rate and regular rhythm.     Heart sounds: No murmur heard. Pulmonary:     Effort: Pulmonary effort is normal. No respiratory distress.     Breath sounds: Normal breath sounds. No wheezing or rales.  Abdominal:     General: Abdomen is flat. Bowel sounds are normal.     Palpations: Abdomen is soft.  Musculoskeletal:     Right lower leg: No edema.     Left lower leg: No edema.  Skin:    General: Skin is warm.  Neurological:     Mental Status: She is alert. Mental status is at baseline.  Psychiatric:        Mood and Affect: Mood normal.        Behavior: Behavior normal.          Teliyah Royal K Braxtyn Bojarski, MD  08/30/24    Contains text generated by Abridge AI Software.    "

## 2024-08-30 NOTE — Progress Notes (Signed)
 Up to date on vaccine.  Has hearing aids. She is wearing them today. Has been to eye doctor within the last year. Sees dentist regularly.

## 2024-08-31 MED ORDER — FUROSEMIDE 20 MG PO TABS: ORAL_TABLET | ORAL | 0 refills | Status: AC

## 2024-09-14 ENCOUNTER — Other Ambulatory Visit (INDEPENDENT_AMBULATORY_CARE_PROVIDER_SITE_OTHER)

## 2024-09-14 DIAGNOSIS — I10 Essential (primary) hypertension: Secondary | ICD-10-CM | POA: Diagnosis not present

## 2024-09-14 LAB — BASIC METABOLIC PANEL WITH GFR
BUN: 48 mg/dL — ABNORMAL HIGH (ref 6–23)
CO2: 24 meq/L (ref 19–32)
Calcium: 9.1 mg/dL (ref 8.4–10.5)
Chloride: 109 meq/L (ref 96–112)
Creatinine, Ser: 2.02 mg/dL — ABNORMAL HIGH (ref 0.40–1.20)
GFR: 20.69 mL/min — ABNORMAL LOW
Glucose, Bld: 106 mg/dL — ABNORMAL HIGH (ref 70–99)
Potassium: 4.3 meq/L (ref 3.5–5.1)
Sodium: 140 meq/L (ref 135–145)

## 2024-09-19 ENCOUNTER — Telehealth: Payer: Self-pay

## 2024-09-19 NOTE — Telephone Encounter (Signed)
 Pt brought a list of BP readings. Asking if she needs to continue taking losartan  double dose.  Readings placed in Dr Charlyn inbox in her office.

## 2024-09-20 ENCOUNTER — Other Ambulatory Visit: Payer: Self-pay

## 2024-09-20 DIAGNOSIS — I10 Essential (primary) hypertension: Secondary | ICD-10-CM

## 2024-09-20 NOTE — Telephone Encounter (Signed)
 Pt did read the MyChart message this morning about medication changes due to elevated BP readings.

## 2024-09-20 NOTE — Telephone Encounter (Signed)
 Copied from CRM [id number]. Topic: Clinical - Medication Refill >> Sep 20, 2024 10:54 AM Mia F wrote: Medication: losartan  (COZAAR ) 25 MG tablet   Has the patient contacted their pharmacy? Yes (Agent: If no, request that the patient contact the pharmacy for the refill. If patient does not wish to contact the pharmacy document the reason why and proceed with request.) (Agent: If yes, when and what did the pharmacy advise?)  This is the patient's preferred pharmacy:  [facility] [address], [place] - [address] [place] [address] Phone: [phone] Fax: [phone]  Is this the correct pharmacy for this prescription? Yes If no, delete pharmacy and type the correct one.   Has the prescription been filled recently? Yes  Is the patient out of the medication? No  Has the patient been seen for an appointment in the last year OR does the patient have an upcoming appointment? Yes  Can we respond through MyChart? No  Agent: Please be advised that Rx refills may take up to 3 business days. We ask that you follow-up with your pharmacy.

## 2024-12-26 ENCOUNTER — Ambulatory Visit: Admitting: Physician Assistant
